# Patient Record
Sex: Male | Born: 1935 | Race: White | Hispanic: No | Smoking: Former smoker
Health system: Southern US, Community
[De-identification: ages and names within clinical notes are randomized; demographics above are authoritative.]

## PROBLEM LIST (undated history)

## (undated) DIAGNOSIS — R9439 Abnormal result of other cardiovascular function study: Secondary | ICD-10-CM

## (undated) DIAGNOSIS — R002 Palpitations: Secondary | ICD-10-CM

## (undated) DIAGNOSIS — I10 Essential (primary) hypertension: Secondary | ICD-10-CM

## (undated) DIAGNOSIS — M1990 Unspecified osteoarthritis, unspecified site: Secondary | ICD-10-CM

## (undated) DIAGNOSIS — S22080A Wedge compression fracture of T11-T12 vertebra, initial encounter for closed fracture: Secondary | ICD-10-CM

## (undated) DIAGNOSIS — E785 Hyperlipidemia, unspecified: Secondary | ICD-10-CM

## (undated) DIAGNOSIS — C801 Malignant (primary) neoplasm, unspecified: Secondary | ICD-10-CM

## (undated) DIAGNOSIS — N529 Male erectile dysfunction, unspecified: Secondary | ICD-10-CM

## (undated) DIAGNOSIS — I219 Acute myocardial infarction, unspecified: Secondary | ICD-10-CM

## (undated) DIAGNOSIS — I2510 Atherosclerotic heart disease of native coronary artery without angina pectoris: Secondary | ICD-10-CM

## (undated) DIAGNOSIS — I6529 Occlusion and stenosis of unspecified carotid artery: Secondary | ICD-10-CM

## (undated) DIAGNOSIS — K5730 Diverticulosis of large intestine without perforation or abscess without bleeding: Secondary | ICD-10-CM

## (undated) DIAGNOSIS — R42 Dizziness and giddiness: Secondary | ICD-10-CM

## (undated) DIAGNOSIS — E079 Disorder of thyroid, unspecified: Secondary | ICD-10-CM

## (undated) DIAGNOSIS — K449 Diaphragmatic hernia without obstruction or gangrene: Secondary | ICD-10-CM

## (undated) DIAGNOSIS — I714 Abdominal aortic aneurysm, without rupture, unspecified: Secondary | ICD-10-CM

## (undated) DIAGNOSIS — I872 Venous insufficiency (chronic) (peripheral): Secondary | ICD-10-CM

## (undated) DIAGNOSIS — K219 Gastro-esophageal reflux disease without esophagitis: Secondary | ICD-10-CM

## (undated) DIAGNOSIS — I209 Angina pectoris, unspecified: Secondary | ICD-10-CM

## (undated) DIAGNOSIS — R0602 Shortness of breath: Secondary | ICD-10-CM

## (undated) DIAGNOSIS — Z9289 Personal history of other medical treatment: Secondary | ICD-10-CM

## (undated) HISTORY — PX: HERNIA REPAIR: SHX51

## (undated) HISTORY — DX: Venous insufficiency (chronic) (peripheral): I87.2

## (undated) HISTORY — DX: Personal history of other medical treatment: Z92.89

## (undated) HISTORY — DX: Unspecified osteoarthritis, unspecified site: M19.90

## (undated) HISTORY — DX: Acute myocardial infarction, unspecified: I21.9

## (undated) HISTORY — DX: Malignant (primary) neoplasm, unspecified: C80.1

## (undated) HISTORY — DX: Occlusion and stenosis of unspecified carotid artery: I65.29

## (undated) HISTORY — DX: Wedge compression fracture of T11-T12 vertebra, initial encounter for closed fracture: S22.080A

## (undated) HISTORY — DX: Dizziness and giddiness: R42

## (undated) HISTORY — DX: Abnormal result of other cardiovascular function study: R94.39

## (undated) HISTORY — DX: Hyperlipidemia, unspecified: E78.5

## (undated) HISTORY — DX: Abdominal aortic aneurysm, without rupture: I71.4

## (undated) HISTORY — DX: Diaphragmatic hernia without obstruction or gangrene: K44.9

## (undated) HISTORY — DX: Essential (primary) hypertension: I10

## (undated) HISTORY — DX: Disorder of thyroid, unspecified: E07.9

## (undated) HISTORY — DX: Male erectile dysfunction, unspecified: N52.9

## (undated) HISTORY — DX: Gastro-esophageal reflux disease without esophagitis: K21.9

## (undated) HISTORY — DX: Diverticulosis of large intestine without perforation or abscess without bleeding: K57.30

## (undated) HISTORY — PX: OTHER SURGICAL HISTORY: SHX169

## (undated) HISTORY — DX: Palpitations: R00.2

## (undated) HISTORY — DX: Abdominal aortic aneurysm, without rupture, unspecified: I71.40

## (undated) HISTORY — PX: HAND SURGERY: SHX662

---

## 1999-12-08 ENCOUNTER — Encounter: Admission: RE | Admit: 1999-12-08 | Discharge: 1999-12-08 | Payer: Self-pay | Admitting: *Deleted

## 1999-12-08 ENCOUNTER — Encounter: Payer: Self-pay | Admitting: *Deleted

## 2001-01-08 ENCOUNTER — Encounter: Payer: Self-pay | Admitting: Internal Medicine

## 2001-01-08 ENCOUNTER — Encounter: Admission: RE | Admit: 2001-01-08 | Discharge: 2001-01-08 | Payer: Self-pay | Admitting: Internal Medicine

## 2001-04-04 ENCOUNTER — Ambulatory Visit (HOSPITAL_COMMUNITY): Admission: RE | Admit: 2001-04-04 | Discharge: 2001-04-04 | Payer: Self-pay | Admitting: Gastroenterology

## 2001-04-04 ENCOUNTER — Encounter (INDEPENDENT_AMBULATORY_CARE_PROVIDER_SITE_OTHER): Payer: Self-pay | Admitting: Specialist

## 2001-06-05 ENCOUNTER — Ambulatory Visit (HOSPITAL_COMMUNITY): Admission: RE | Admit: 2001-06-05 | Discharge: 2001-06-05 | Payer: Self-pay | Admitting: Internal Medicine

## 2001-06-05 ENCOUNTER — Encounter: Payer: Self-pay | Admitting: Internal Medicine

## 2001-09-01 ENCOUNTER — Ambulatory Visit (HOSPITAL_COMMUNITY): Admission: RE | Admit: 2001-09-01 | Discharge: 2001-09-01 | Payer: Self-pay | Admitting: Internal Medicine

## 2001-09-01 ENCOUNTER — Encounter: Payer: Self-pay | Admitting: Internal Medicine

## 2001-10-22 HISTORY — PX: ROTATOR CUFF REPAIR: SHX139

## 2001-12-31 ENCOUNTER — Encounter: Admission: RE | Admit: 2001-12-31 | Discharge: 2001-12-31 | Payer: Self-pay | Admitting: Internal Medicine

## 2001-12-31 ENCOUNTER — Encounter: Payer: Self-pay | Admitting: Internal Medicine

## 2002-01-14 ENCOUNTER — Encounter: Payer: Self-pay | Admitting: Orthopedic Surgery

## 2002-01-14 ENCOUNTER — Encounter: Admission: RE | Admit: 2002-01-14 | Discharge: 2002-01-14 | Payer: Self-pay | Admitting: Orthopedic Surgery

## 2002-01-15 ENCOUNTER — Ambulatory Visit (HOSPITAL_BASED_OUTPATIENT_CLINIC_OR_DEPARTMENT_OTHER): Admission: RE | Admit: 2002-01-15 | Discharge: 2002-01-16 | Payer: Self-pay | Admitting: Orthopedic Surgery

## 2002-12-24 ENCOUNTER — Encounter: Admission: RE | Admit: 2002-12-24 | Discharge: 2002-12-24 | Payer: Self-pay | Admitting: Internal Medicine

## 2002-12-24 ENCOUNTER — Encounter: Payer: Self-pay | Admitting: Internal Medicine

## 2004-01-03 ENCOUNTER — Encounter: Admission: RE | Admit: 2004-01-03 | Discharge: 2004-01-03 | Payer: Self-pay | Admitting: Internal Medicine

## 2004-05-12 ENCOUNTER — Ambulatory Visit (HOSPITAL_COMMUNITY): Admission: RE | Admit: 2004-05-12 | Discharge: 2004-05-12 | Payer: Self-pay | Admitting: Gastroenterology

## 2004-05-12 ENCOUNTER — Encounter (INDEPENDENT_AMBULATORY_CARE_PROVIDER_SITE_OTHER): Payer: Self-pay | Admitting: Specialist

## 2005-01-02 ENCOUNTER — Encounter: Admission: RE | Admit: 2005-01-02 | Discharge: 2005-01-02 | Payer: Self-pay | Admitting: Internal Medicine

## 2006-01-07 ENCOUNTER — Encounter: Admission: RE | Admit: 2006-01-07 | Discharge: 2006-01-07 | Payer: Self-pay | Admitting: Family Medicine

## 2006-06-09 ENCOUNTER — Emergency Department (HOSPITAL_COMMUNITY): Admission: EM | Admit: 2006-06-09 | Discharge: 2006-06-10 | Payer: Self-pay | Admitting: Emergency Medicine

## 2006-07-04 ENCOUNTER — Encounter: Admission: RE | Admit: 2006-07-04 | Discharge: 2006-07-04 | Payer: Self-pay | Admitting: Internal Medicine

## 2006-09-17 ENCOUNTER — Encounter: Admission: RE | Admit: 2006-09-17 | Discharge: 2006-10-11 | Payer: Self-pay | Admitting: Internal Medicine

## 2007-01-08 ENCOUNTER — Encounter: Admission: RE | Admit: 2007-01-08 | Discharge: 2007-01-08 | Payer: Self-pay | Admitting: Internal Medicine

## 2007-07-09 ENCOUNTER — Encounter: Admission: RE | Admit: 2007-07-09 | Discharge: 2007-07-09 | Payer: Self-pay | Admitting: Internal Medicine

## 2007-07-16 ENCOUNTER — Ambulatory Visit: Payer: Self-pay | Admitting: Vascular Surgery

## 2008-01-14 ENCOUNTER — Ambulatory Visit: Payer: Self-pay | Admitting: Vascular Surgery

## 2008-07-07 ENCOUNTER — Ambulatory Visit: Payer: Self-pay | Admitting: Vascular Surgery

## 2009-01-05 ENCOUNTER — Ambulatory Visit: Payer: Self-pay | Admitting: Vascular Surgery

## 2009-01-12 ENCOUNTER — Encounter: Admission: RE | Admit: 2009-01-12 | Discharge: 2009-01-12 | Payer: Self-pay | Admitting: Vascular Surgery

## 2009-02-02 ENCOUNTER — Ambulatory Visit: Payer: Self-pay | Admitting: Vascular Surgery

## 2009-02-24 ENCOUNTER — Ambulatory Visit: Payer: Self-pay | Admitting: Vascular Surgery

## 2009-02-24 ENCOUNTER — Inpatient Hospital Stay (HOSPITAL_COMMUNITY): Admission: RE | Admit: 2009-02-24 | Discharge: 2009-02-25 | Payer: Self-pay | Admitting: *Deleted

## 2009-02-24 HISTORY — PX: ABDOMINAL AORTIC ANEURYSM REPAIR: SUR1152

## 2009-02-25 ENCOUNTER — Encounter: Payer: Self-pay | Admitting: Vascular Surgery

## 2009-03-23 ENCOUNTER — Ambulatory Visit: Payer: Self-pay | Admitting: Vascular Surgery

## 2009-03-23 ENCOUNTER — Encounter: Admission: RE | Admit: 2009-03-23 | Discharge: 2009-03-23 | Payer: Self-pay | Admitting: Vascular Surgery

## 2009-04-15 ENCOUNTER — Encounter: Admission: RE | Admit: 2009-04-15 | Discharge: 2009-04-15 | Payer: Self-pay | Admitting: Internal Medicine

## 2009-04-15 ENCOUNTER — Emergency Department (HOSPITAL_COMMUNITY): Admission: EM | Admit: 2009-04-15 | Discharge: 2009-04-15 | Payer: Self-pay | Admitting: Emergency Medicine

## 2009-05-16 ENCOUNTER — Ambulatory Visit: Payer: Self-pay | Admitting: Surgery

## 2010-04-04 DIAGNOSIS — Z9289 Personal history of other medical treatment: Secondary | ICD-10-CM

## 2010-04-04 HISTORY — DX: Personal history of other medical treatment: Z92.89

## 2010-08-31 ENCOUNTER — Encounter: Admission: RE | Admit: 2010-08-31 | Discharge: 2010-08-31 | Payer: Self-pay | Admitting: Vascular Surgery

## 2010-08-31 ENCOUNTER — Ambulatory Visit: Payer: Self-pay | Admitting: Vascular Surgery

## 2010-09-06 ENCOUNTER — Encounter: Admission: RE | Admit: 2010-09-06 | Discharge: 2010-09-06 | Payer: Self-pay | Admitting: Sports Medicine

## 2011-01-29 LAB — COMPREHENSIVE METABOLIC PANEL
AST: 42 U/L — ABNORMAL HIGH (ref 0–37)
Albumin: 3.4 g/dL — ABNORMAL LOW (ref 3.5–5.2)
BUN: 10 mg/dL (ref 6–23)
Calcium: 9.1 mg/dL (ref 8.4–10.5)
Creatinine, Ser: 0.8 mg/dL (ref 0.4–1.5)
GFR calc Af Amer: >60 mL/min (ref >60)
Total Bilirubin: 0.5 mg/dL (ref 0.3–1.2)

## 2011-01-29 LAB — COMPREHENSIVE METABOLIC PANEL WITH GFR
ALT: 59 U/L — ABNORMAL HIGH (ref 0–53)
Alkaline Phosphatase: 95 U/L (ref 39–117)
CO2: 22 meq/L (ref 19–32)
Chloride: 99 meq/L (ref 96–112)
GFR calc non Af Amer: >60 mL/min (ref >60)
Glucose, Bld: 97 mg/dL (ref 70–99)
Potassium: 4 meq/L (ref 3.5–5.1)
Sodium: 133 meq/L — ABNORMAL LOW (ref 135–145)
Total Protein: 7.6 g/dL (ref 6.0–8.3)

## 2011-01-29 LAB — URINALYSIS, ROUTINE W REFLEX MICROSCOPIC
Bilirubin Urine: NEGATIVE
Glucose, UA: NEGATIVE mg/dL
Hgb urine dipstick: NEGATIVE
Ketones, ur: NEGATIVE mg/dL
Nitrite: NEGATIVE
Protein, ur: NEGATIVE mg/dL
Specific Gravity, Urine: 1.005 (ref 1.005–1.030)
Urobilinogen, UA: 0.2 mg/dL (ref 0.0–1.0)
pH: 6.5 (ref 5.0–8.0)

## 2011-01-29 LAB — CBC
HCT: 40.7 % (ref 39.0–52.0)
Hemoglobin: 13.5 g/dL (ref 13.0–17.0)
MCHC: 33.3 g/dL (ref 30.0–36.0)
MCV: 92.7 fL (ref 78.0–100.0)
Platelets: 309 10*3/uL (ref 150–400)
RBC: 4.39 MIL/uL (ref 4.22–5.81)
RDW: 12.9 % (ref 11.5–15.5)
WBC: 9.3 K/uL (ref 4.0–10.5)

## 2011-01-29 LAB — DIFFERENTIAL
Basophils Absolute: 0 10*3/uL (ref 0.0–0.1)
Basophils Relative: 0 % (ref 0–1)
Eosinophils Absolute: 0 K/uL (ref 0.0–0.7)
Eosinophils Relative: 1 % (ref 0–5)
Lymphocytes Relative: 22 % (ref 12–46)
Lymphs Abs: 2 10*3/uL (ref 0.7–4.0)
Monocytes Absolute: 0.8 10*3/uL (ref 0.1–1.0)
Monocytes Relative: 8 % (ref 3–12)
Neutro Abs: 6.5 10*3/uL (ref 1.7–7.7)
Neutrophils Relative %: 70 % (ref 43–77)

## 2011-01-29 LAB — LIPASE, BLOOD: Lipase: 18 U/L (ref 11–59)

## 2011-01-30 LAB — URINALYSIS, ROUTINE W REFLEX MICROSCOPIC
Hgb urine dipstick: NEGATIVE
Specific Gravity, Urine: 1.015 (ref 1.005–1.030)
Urobilinogen, UA: 0.2 mg/dL (ref 0.0–1.0)
pH: 7.5 (ref 5.0–8.0)

## 2011-01-30 LAB — COMPREHENSIVE METABOLIC PANEL
ALT: 27 U/L (ref 0–53)
AST: 31 U/L (ref 0–37)
Albumin: 3.9 g/dL (ref 3.5–5.2)
Alkaline Phosphatase: 64 U/L (ref 39–117)
CO2: 26 mEq/L (ref 19–32)
Chloride: 105 mEq/L (ref 96–112)
Creatinine, Ser: 0.75 mg/dL (ref 0.4–1.5)
GFR calc Af Amer: >60 mL/min (ref >60)
Potassium: 4 mEq/L (ref 3.5–5.1)
Sodium: 138 mEq/L (ref 135–145)
Total Bilirubin: 0.8 mg/dL (ref 0.3–1.2)

## 2011-01-30 LAB — BLOOD GAS, ARTERIAL
Drawn by: 313941
FIO2: 0.21 %
Patient temperature: 98.6
pCO2 arterial: 37.2 mmHg (ref 35.0–45.0)
pH, Arterial: 7.447 (ref 7.350–7.450)

## 2011-01-30 LAB — CBC
MCHC: 34.8 g/dL (ref 30.0–36.0)
Platelets: 142 10*3/uL — ABNORMAL LOW (ref 150–400)
RBC: 4.01 MIL/uL — ABNORMAL LOW (ref 4.22–5.81)
RBC: 4.67 MIL/uL (ref 4.22–5.81)
RDW: 13.4 % (ref 11.5–15.5)
WBC: 6.9 10*3/uL (ref 4.0–10.5)

## 2011-01-30 LAB — BASIC METABOLIC PANEL
CO2: 26 mEq/L (ref 19–32)
Calcium: 8.4 mg/dL (ref 8.4–10.5)
Creatinine, Ser: 0.72 mg/dL (ref 0.4–1.5)
GFR calc Af Amer: >60 mL/min (ref >60)
GFR calc non Af Amer: >60 mL/min (ref >60)
Glucose, Bld: 144 mg/dL — ABNORMAL HIGH (ref 70–99)

## 2011-01-30 LAB — TYPE AND SCREEN
ABO/RH(D): B POS
Antibody Screen: NEGATIVE

## 2011-01-30 LAB — PROTIME-INR: INR: 1.1 (ref 0.00–1.49)

## 2011-03-06 NOTE — Discharge Summary (Signed)
NAME:  Henry Neal, Henry Neal NO.:  [id number]   MEDICAL RECORD NO.:  [id number]          PATIENT TYPE:  INP   LOCATION:  3305                         FACILITY:  [facility]   PHYSICIAN:  Janetta Hora. Fields, MD  DATE OF BIRTH:  01/16/1927   DATE OF ADMISSION:  02/24/2009  DATE OF DISCHARGE:  02/25/2009                               DISCHARGE SUMMARY   The patient of Dr. Darrick Penna.   FINAL DISCHARGE DIAGNOSES:  1. Abdominal aortic aneurysm.  2. Erectile dysfunction.  3. T12 compression fracture.  4. Allergic rhinitis.  5. Positional vertigo.  6. Sigmoid diverticulosis.  7. Colonic polyps.  8. Chronic venous insufficiency.  9. Gastroesophageal reflux disease.  10.Hypothyroidism.  11.Hypertension.   PROCEDURE PERFORMED:  Endovascular repair of abdominal aortic aneurysm  using gore excluder, abdominal aortic aneurysm stent graft 23 x 12 x 14  main body, limbs crossed 12 x 10 on left leg by Dr. Darrick Penna on Feb 24, 2009.   COMPLICATIONS:  None.   CONDITION AT DISCHARGE:  Stable and improving.   DISCHARGE MEDICATIONS:  He is instructed to resume all previous  medications consisting of,  1. Toprol 50 mg p.o. daily.  2. Triamterene and hydrochlorothiazide 37.5/25 mg p.o. daily.  3. Prilosec 20 mg p.o. daily.  4. Centrum Silver p.o. daily.  5. Aspirin 81 mg p.o. daily.  6. Calcium plus D 600 mg p.o. b.i.d.  7. He is given a prescription for Percocet 5/325 one p.o. q.4 h.      p.r.n. pain, total #30 were given.   DISPOSITION:  He is being discharged home in stable condition with his  wounds healing well.  He is given careful instructions regarding the  care of his wounds and activity level.  He is given a return appointment  with Dr. Darrick Penna in 4 weeks with CTA of the abdomen and pelvis with stent  graft protocol and ABIs.   BRIEF IDENTIFYING STATEMENT:  For complete details, please refer to the  typed history and physical.  Briefly, this 77 year old gentleman with an  abdominal aortic aneurysm was referred to Dr. Darrick Penna for evaluation.  Dr. Darrick Penna evaluated him and found him to be a suitable candidate for  endovascular repair.  He was informed of the risks and benefits of the  procedure and after careful consideration elected to proceed with  surgery.   HOSPITAL COURSE:  Preoperative workup was completed as an outpatient.  He was brought in through same-day surgery and underwent the  aforementioned repair of his abdominal aortic aneurysm.  For complete  details, please refer to the typed operative report.  The procedure was  without complications and was returned to the postanesthesia care unit  extubated.  Following stabilization, he was transferred to a bed on a  surgical step-down unit.  He was observed overnight.  Following day, he  was walking and improving with physical therapy.  He was voiding.  He  was felt to be stable and was discharged home.      Wilmon Arms, PA      Janetta Hora. Darrick Penna, MD  Electronically Signed    KEL/MEDQ  D:  02/25/2009  T:  02/25/2009  Job:  [id number]

## 2011-03-06 NOTE — Op Note (Signed)
NAME:  Henry Neal, Henry Neal NO.:  [id number]   MEDICAL RECORD NO.:  [id number]          PATIENT TYPE:  INP   LOCATION:  3305                         FACILITY:  [facility]   PHYSICIAN:  Larina Earthly, M.D.    DATE OF BIRTH:  [birth year]   DATE OF PROCEDURE:  02/24/2009  DATE OF DISCHARGE:                               OPERATIVE REPORT   PREOPERATIVE DIAGNOSIS:  Infrarenal abdominal aortic aneurysm.   PROCEDURE:  Exposure of left femoral artery with femoral artery cutdown  and closure.   SURGEON:  Larina Earthly, MD   ANESTHESIA:  General endotracheal.   This was in conjunction with aortic stent graft, repair of abdominal  aortic aneurysm, which will be dictated in as a separate note by Dr.  Fabienne Bruns.   PROCEDURE IN DETAIL:  The patient was taken to the operating room,  placed in supine position, where the area of the bilateral groins and  abdomen prepped and draped in the usual sterile fashion.  Incision was  made over the femoral pulse on the left and carried down through the  subcutaneous tissue with electrocautery.  Tributary branch of the common  femoral artery was ligated and divided for exposure.  The femoral artery  did have a posterior plaque.  The artery was exposed just at the area of  the inguinal ligament down to the level of the superficial femoral and  profunda femoris artery bifurcation.  This was doubly looped with vessel  loop proximally and distally for control.  The remaining of the  procedure will be dictated by Dr. Darrick Penna.  At the completion of the  procedure, the 12-French sheath was removed from the left common femoral  artery and control was obtained with a Henley clamp proximally and an  angled DeBakey clamp distally.  The resulting hole in the artery was  controlled and closed with running 5-0 Prolene suture.  The subcutaneous  tissue was closed with 2-0 Vicryl suture in several layers.  The skin  was closed with 3-0 subcuticular Vicryl  stitch.  Sterile dressing was  applied.  The patient was taken to the recovery room in stable  condition.      Larina Earthly, M.D.  Electronically Signed     TFE/MEDQ  D:  02/24/2009  T:  02/25/2009  Job:  [id number]

## 2011-03-06 NOTE — Assessment & Plan Note (Signed)
OFFICE VISIT   Henry Neal, Henry Neal  DOB:  [birth year]                                       03/23/2009  ZOXWR#:[id number]   The patient returns for follow-up today.  He underwent placement of an  aneurysm stent graft on Feb 24, 2009.  He had no problems during his  hospital course or postoperative follow-up.  He returns today for  further follow-up today.  He apparently had a urinary tract infection  recently but thinks he may be getting better from this, although he  still has some vague pelvic abdominal pain.   He denies any claudication type symptoms.  He denies any abdominal pain.  He does still have some occasional back pain which he says is related to  the hospital bed.  Overall, he is doing well.  His appetite is normal  and he has essentially returned to his normal activities at this point.   PHYSICAL EXAMINATION:  Today, blood pressure is 165/73 in the left arm,  pulse is 67 and regular.  Temperature is 98.4.  Left and right groin  incisions are well-healed.  He has 2+ femoral and dorsalis pedis pulses  bilaterally.  Abdomen:  Soft, nontender, nondistended.  No masses.   CT scan of the abdomen and pelvis was obtained today.  This shows that  the aneurysm is well excluded with top portion of the stent right at the  level of renal arteries.  There is no type 1 endoleak proximally or  distally.  There is a small type 2 endoleak which appears to be coming  from the L3 lumbar artery.  Aneurysm diameter was 52.7-mm x 50.2-mm.  This is essentially unchanged from preoperatively.   Overall, the patient appears to be doing well.  His aneurysm is well-  excluded by the stent graft.  He will follow up in 6 months' time for  repeat CT angio at that time.  We will continue to follow his type 2  endoleak and as long as there is no expansion of the aneurysm these are  usually benign in nature.  I discussed this with the patient today and  also reviewed the CT scan  films with the patient.   Janetta Hora. Fields, MD  Electronically Signed   CEF/MEDQ  D:  03/23/2009  T:  03/24/2009  Job:  [id number]   cc:   Thora Lance, M.D.

## 2011-03-06 NOTE — Procedures (Signed)
DUPLEX ULTRASOUND OF ABDOMINAL AORTA   INDICATION:  Follow-up evaluation of known abdominal aortic aneurysm.   HISTORY:  Diabetes:  No.  Cardiac:  No.  Hypertension:  Yes.  Smoking:  No.  Connective Tissue Disorder:  Family History:  Cousin had an abdominal aortic aneurysm.  Previous Surgery:   DUPLEX EXAM:         AP (cm)                   TRANSVERSE (cm)  Proximal             2.1 cm                    2.0 cm  Mid                  4.6 cm                    5.4 cm  Distal               4.5 cm                    4.5 cm  Right Iliac          1.3 cm                    1.6 cm  Left Iliac           3.3 cm                    3.1 cm   PREVIOUS:  Date:  07/07/2008  AP:  4.8  TRANSVERSE:  4.9   IMPRESSION:  1. Abdominal aortic aneurysm is slightly larger than previously      recorded.  2. 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   ___________________________________________  Janetta Hora. Fields, MD   MC/MEDQ  D:  01/05/2009  T:  01/05/2009  Job:  [id number]

## 2011-03-06 NOTE — Op Note (Signed)
NAME:  Henry Neal, Henry Neal NO.:  [id number]   MEDICAL RECORD NO.:  [id number]          PATIENT TYPE:  INP   LOCATION:  2899                         FACILITY:  [facility]   PHYSICIAN:  Janetta Hora. Fields, MD  DATE OF BIRTH:  [birth year]   DATE OF PROCEDURE:  02/24/2009  DATE OF DISCHARGE:                               OPERATIVE REPORT   PROCEDURE:  Placement of Gore excluder abdominal aortic aneurysm stent  graft.   PREOPERATIVE DIAGNOSIS:  Abdominal aortic aneurysm.   POSTOPERATIVE DIAGNOSIS:  Abdominal aortic aneurysm.   ANESTHESIA:  General.   SURGEON:  Charles E. Fields, MD   ASSISTANT:  Larina Earthly, MD   OPERATIVE FINDINGS:  1. A 23 x 12 x 14 main body placed through the right leg.  2. A 12 x 10 left leg extension, left leg limb.   OPERATIVE DETAILS:  After obtaining informed consent, the patient was  taken to the operating room.  The patient was placed in supine position  on the operating room table.  After induction of general anesthesia and  endotracheal intubation, a Foley catheter was placed.  Next, the patient  was prepped from the xiphoid down to the knees bilaterally.  Bilateral  oblique incisions were made in each groin to expose the common femoral  artery.  The left groin exposure and repair is dictated separately as an  operative note by Dr. Arbie Cookey.   Incision on the right side was carried down through the subcutaneous  tissues down to the level of the right common femoral artery.  This was  dissected free circumferentially.  There was some posterior plaque but  the anterior surface of the artery was soft in character.  After the  artery had been exposed, an introducer needle was used to cannulate the  left common femoral artery and a 0.035 Benson wire was introduced into  the left iliac system and up into the abdominal aorta under fluoroscopic  guidance.  A long 8-French sheath was then placed over the guidewire in  the left iliac system.  An  introducer needle was then used to cannulate  the right common femoral artery.  A 0.035 J-tip guidewire was threaded  through the right femoral artery and up into the abdominal aorta under  fluoroscopic guidance.  A short 8-French sheath was then placed over the  guidewire into the right common femoral artery.  Both the sheaths were  thoroughly flushed with heparinized saline.  A 5-French Omni flush  catheter was then brought up in the operative field and placed over the  guidewire through the right groin up to the level of the abdominal  aorta.  Abdominal aortogram was then obtained to determine location of  the renal arteries as well as determine the length of the main body  device.  Measurements were made off the calibrated Omni flush catheter  and a 23 x 12 x 14 main body device was selected.  This device was  selected so that the extension on the right leg would be just short of  the takeoff of the internal iliac  artery.  At this point, the 5-French  Omni flush catheter was pulled back over the guidewire and exchanged  over to the left side over the Bentson wire.  This was kept in place  just above the level of the renal arteries.  The 8-French sheath on the  right side was then exchanged for an 18-French sheath.  This was  slightly snug going through the right common iliac artery and it  navigated and crossed the shelf on the contralateral side of the  aneurysm just below the neck.  However, this did pass with minimal  difficulty.  The main body device was then introduced through the 18-  [place] sheath.  However, this also stuck on the angle of the aneurysm  sac.  Therefore, the device was pulled back out and the dilator placed  back into the 18-French sheath and the sheath advanced up higher past  the shelf just below the level of the renal arteries.  The device was  then reintroduced into the 18-French sheath and the sheath pulled back,  so that the device was placed in the  approximate location of the renal  arteries.  An additional contrast angiogram was obtained to determine  precise level of the renal arteries.  The main body device was then  deployed in a continuous fashion down to the gate.  The top end of the  graft seated well in the aneurysm.  This was deployed at an 15  craniocaudal angulation to make sure that there was no parallax.  Next,  the 5-French Omni sheath catheter was pulled down to the level of the  gate.  This was then selectively catheterized and the 0.035 Bentson wire  threaded up into the main body of the aneurysm device.  The 5-French  Omni flush catheter was then placed over the guidewire into the middle  of the device.  This was then twirled to confirm that we were within the  lumen of the graft.  At this point, a retrograde contrast angiogram was  obtained through the 8-French sheath on the left side.  This was to  determine precise lengths for the left iliac system.  A 12 x 10 cm left  leg was selected to give [place] adequate coverage into the left iliac system  without covering the left internal iliac artery.  An Amplatz wire was  then placed through the Omni flush catheter and the Omni flush catheter  was removed.  The 8-French sheath was then also removed.  This was  exchanged for a 12-French sheath.  The 12 x 10 left limb was then  introduced through the 12-French sheath with significant overlap up to  the long marker in the contralateral limb.  This was then deployed in  the usual fashion down into the left iliac system.  There were at least  3 cm of graft into the left iliac system with at least 1 cm of uncovered  artery just above the internal iliac artery.  At this point, the  delivery device was removed.  The remainder of the right limb was then  fully deployed after pulling the sheath back.  A Gore molding balloon  was then brought up in the operative field and introduced through the  left iliac system.  The top portion of  the stent was molded to shape  after a full inflation of the balloon.  This was then brought down to  the level of the attachment point of the left limb and  the distal end of  the left limb and these were also tacked up with the balloon.  The  balloon was then moved over to the right side and the entire right limb  was molded with a balloon as well.  The 5-French Omni flush catheter was  then threaded back over the guidewire up the left side.  A completion  arteriogram was then obtained.  This showed patent left and right renal  arteries with good apposition of the stent graft proximally and  distally.  There was no evidence of type 1 endoleak.  There was  suggestion of a delayed type 2 endoleak with some delayed contrast  filling the sac.  At this point, all guidewires and sheaths were  removed.  The artery was controlled proximally with a Henley clamp and  distally with a peripheral DeBakey clamp.  The arteriotomy was repaired  using a running 5-0 Prolene suture.  Everything was thoroughly flushed  and back-bled prior to completing the anastomosis.  Clamps were removed.  There was palpable pulse in right common femoral artery immediately.  The groin wound on the right side was closed with running 2-0, 3-0  Vicryl suture and a 4-0 Vicryl subcuticular stitch in the skin.  Left  groin and artery closure as stated previously is dictated separately by  Dr. Arbie Cookey in a separate operative note.  The patient's feet were checked  at the end of the case.  They were pink and warm.  The patient was  extubated in the operating room and taken to recovery room in stable  condition.      Janetta Hora. Fields, MD  Electronically Signed     CEF/MEDQ  D:  02/24/2009  T:  02/25/2009  Job:  [id number]

## 2011-03-06 NOTE — Procedures (Signed)
DUPLEX ULTRASOUND OF ABDOMINAL AORTA   INDICATION:  Followup of known abdominal aortic aneurysm.   HISTORY:  Diabetes:  No.  Cardiac:  No.  Hypertension:  Yes.  Smoking:  No.  Connective Tissue Disorder:  Family History:  Cousin died from AAA.  Previous Surgery:  No.   DUPLEX EXAM:         AP (cm)                   TRANSVERSE (cm)  Proximal             1.96 cm                   2.12 cm  Mid                  4.58 cm                   4.79 cm  Distal               2.33 cm                   2.33 cm  Right Iliac          0.99 cm                   0.90 cm  Left Iliac           0.86 cm                   0.96 cm   PREVIOUS:  Date: September 2008  AP:  TRANSVERSE:  4.9   IMPRESSION:  1. Known AAA (abdominal aortic aneurysm) with maximum diameter      measuring 4.58 cm (AP) x 4.79.  2. Significant mural thrombus noted in M/D AAA.  3. Bilateral CIAs WNL.   ___________________________________________  Janetta Hora Fields, MD   PB/MEDQ  D:  01/14/2008  T:  01/14/2008  Job:  [id number]

## 2011-03-06 NOTE — Procedures (Signed)
DUPLEX ULTRASOUND OF ABDOMINAL AORTA   INDICATION:  Follow up abdominal aortic aneurysm.   HISTORY:  Diabetes:  No.  Cardiac:  No.  Hypertension:  Yes.  Smoking:  No.  Connective Tissue Disorder:  Family History:  Cousin died of abdominal aortic aneurysm.  Previous Surgery:  No.   DUPLEX EXAM:         AP (cm)                   TRANSVERSE (cm)  Proximal             2.9 Cm                    2.8 cm  Mid                  2.1 cm                    2.3 cm  Distal               4.8 cm                    4.9 cm  Right Iliac          Not visualized            Not visualized  Left Iliac           Not visualized            Not visualized   PREVIOUS:  Date: 01/13/08  AP:  4.58  TRANSVERSE:  4.79   IMPRESSION:  1. Aneurysm of the distal abdominal aorta with significant mural      thrombus noted.  2. Unable to adequately visualize the bilateral common iliac arteries      due to overlying bowel gas patterns.  3. No significant change noted in the maximum diameter measurement      when compared to the previous examination on 01/13/08.   ___________________________________________  Janetta Hora. Fields, MD   CH/MEDQ  D:  07/07/2008  T:  07/07/2008  Job:  [id number]

## 2011-03-06 NOTE — Assessment & Plan Note (Signed)
OFFICE VISIT   Henry Neal, Henry Neal  DOB:  08-17-1927                                       05/16/2009  VWUJW#:[id number]   REASON FOR VISIT:  Evaluate left groin.   HISTORY:  This is a 77 year old gentleman who has undergone endovascular  repair of his aneurysm.  He is a walk-in today for evaluation of his  left groin.  He states that he has noticed some drainage on his  underwear at home and he was concerned about this.   On examination, his incision is well-healed.  There is no evidence of  infection.  There is minimal skin separation.  I do not think this is  going to cause him a problem.  I have recommended that he place a piece  of gauze over his incision to prevent it from staining his underwear and  he can follow up with Dr. Darrick Penna at his regularly-scheduled appointment.   Jorge Ny, MD  Electronically Signed   VWB/MEDQ  D:  05/16/2009  T:  05/18/2009  Job:  [id number]

## 2011-03-06 NOTE — Assessment & Plan Note (Signed)
OFFICE VISIT   Henry Neal, Henry Neal  DOB:  1926/12/20                                       07/16/2007  ZOXWR#:[id number]   The patient is a 77 year old male referred by Dr. Valentina Lucks for  asymptomatic abdominal aortic aneurysm.  He denies any history of  abdominal or back pain.  He has no family history of aneurysm.  He has  had a known abdominal aortic aneurysm, which has been followed over time  by serial ultrasound.  He recently had an ultrasound in March 2008,  which showed that the aneurysm was 4.5 cm in diameter.  His most recent  ultrasound in September 2008 showed the aneurysm now to be 4.9 cm in  diameter.  His atherosclerotic risk factors include hypertension.  He  denies history of coronary artery disease, elevated cholesterol, or  diabetes.  He is a former smoker, but quit several years ago.   PAST SURGICAL HISTORY:  Remarkable for a rotator cuff repair in his left  arm in 2003 and bilateral inguinal hernia repairs in 1986.   PAST MEDICAL HISTORY:  Also remarkable for erectile dysfunction, T12  compression fracture, allergic rhinitis, positional vertigo, sigmoid  diverticulosis, colon polyps, chronic venous insufficiency, GRRD, and  hyperthyroidism.   MEDICATIONS:  1. Triamterine/hydrochlorothiazide 37.5/25 daily.  2. Toprol 50 mg once a day.  3. Prilosec 20 mg once a day.  4. Centrum Silver once a day.  5. Calcium plus vitamin D 600 mg twice a day.  6. Aspirin 81 mg once a day.   He is allergic to Novocaine, which causes tingling, but states that  Xylocaine is okay.   SOCIAL HISTORY:  He is a retired Chartered certified accountant.  He is widowed.  He drinks 1  to 2 beers per day.  Smoking his is as above.   REVIEW OF SYSTEMS:  He is 5 feet 11 inches, 195 pounds.  He denies  recent history of chest pain, asthma, wheezing, renal dysfunction,  stroke, TIA, or amaurosis.  He does have a history of dizziness,  arthritis, and a hiatal hernia.   PHYSICAL EXAM:   Blood pressure is 152/74, heart rate is 59 and regular.  HEENT is unremarkable.  He has 2+ carotid pulses without bruit.  Chest  is clear to auscultation.  Cardiac exam is regular rate and rhythm  without murmur.  Abdomen is soft, nontender, and nondistended.  He has a  vaguely palpable pulsatile mass in the epigastrium.  He has 2+ carotid  pulses without bruit, 2+ radial, femoral, 1+ dorsalis pedis, and 2+  posterior tibial pulses bilaterally.  He has 1+ popliteal pulses  bilaterally.   On reviewing the patient's ultrasounds, his aneurysm has grown slowly  over the last few years.  It is still less than 5.5 cm in diameter,  which means his risk of rupture should be less than 1% per year.  However, I do agree he should be followed closely.  He should be  scheduled for an ultrasound in 6 months' time and an office visit at  that time as well.  I also informed him that, if he has any abdominal or  back pain, he should inform anyone in the emergency room he has a  history of an aneurysm.  If his aneurysm does reach the threshold of 5.5  cm and continues to grow at this  rate, we will consider repair in the  near future.  I had a lengthy discussion with the patient and his  daughter regarding his treatment plan today.   Janetta Hora. Fields, MD  Electronically Signed   CEF/MEDQ  D:  08/13/2007  T:  08/14/2007  Job:  [id number]   cc:   Thora Lance, M.D.

## 2011-03-06 NOTE — Assessment & Plan Note (Signed)
OFFICE VISIT   LAURI, TILL  DOB:  12-Jan-1927                                       01/14/2008  [id number]#:[id number]   Patient returns for followup today.  He was last seen in 09/08.  At that  time, he had a 4.9 cm abdominal aortic aneurysm.  His primary risk  factor is hypertension.  He denies any significant abdominal or back  pain in the last six months.  He has had no hospital admissions and has  really been well overall.   He has had no changes in his medications since his last visit.   PHYSICAL EXAMINATION:  Blood pressure is 164/77, heart rate 59 and  regular.  Cardiac:  Regular rate and rhythm.  Chest:  Clear to  auscultation.  He has 2+ femoral, posterior  tibial pulses bilaterally.  He has 1+ dorsalis pedis pulses bilaterally.  Abdomen is soft and  nontender with a vaguely palpable pulsatile epigastric mass.  This is  nontender.   He had a repeat ultrasound of his aneurysm today which showed the  aneurysm diameter is 4.8 cm, compared to 4.9 cm in 09/08.   I believe the best option for the patient is continued observation with  serial ultrasounds.  He will see me again in six months time for repeat  ultrasound.  He will inform the emergency room if he has any episodes of  abdominal or back pain.  I did reassure him that the risk for rupture of  aneurysm of less than 5.5 cm is less than 1% per year.   Janetta Hora. Fields, MD  Electronically Signed   CEF/MEDQ  D:  01/14/2008  T:  01/15/2008  Job:  [id number]   cc:   Thora Lance, M.D.

## 2011-03-06 NOTE — Assessment & Plan Note (Signed)
OFFICE VISIT   Henry, Neal  DOB:  [birth year]                                       08/31/2010  [id number]#:[id number]   The patient returns for followup today.  He previously underwent repair  of abdominal aortic aneurysm with a Gore excluder stent graft in May of  2010.  Since that time he denies any abdominal or back pain.  He states  he has been undergoing a workup recently for some palpitations.  Overall  though he feels well.  He states he has gained a little bit of weight.   CHRONIC MEDICAL PROBLEMS:  Continue to remain hypertension.  This is  well-controlled and followed by his primary care doctor.  He states  usually his blood pressure runs in the 120s to 130s at home although it  was more elevated in our office visit today.   REVIEW OF SYSTEMS:  He denies any shortness of breath, chest pain or  chronic cough.   PHYSICAL EXAM:  Vital signs:  Blood pressure is 175/70 in the left arm,  oxygen saturation 99% on room air, heart rate 64 and regular.  Abdomen:  Soft, nontender, nondistended.  No masses.  Both groin incisions are  well-healed.  Extremities:  Pink, warm and well-perfused.   He had a CT angiogram of the abdomen and pelvis today which shows that  his aneurysm diameter has contracted from 5.4 cm to 2.7 cm.  There is no  evidence of migration.  Stent graft is in good position.   Of note, he previously had some chest nodules on his CT scan.  These  were not seen on the CT scan today as the cuts were slightly lower.  Therefore, we have scheduled him for a repeat CT scan of the chest to  make sure that the nodules in his chest are stable.  If these look  unchanged then we will schedule him for a followup ultrasound of the  aorta in one year's time.     Janetta Hora. Fields, MD  Electronically Signed   CEF/MEDQ  D:  08/31/2010  T:  09/01/2010  Job:  [id number]   cc:   Thora Lance, M.D.

## 2011-03-06 NOTE — Assessment & Plan Note (Signed)
OFFICE VISIT   ZYMERE, PATLAN  DOB:  01-Nov-1926                                       02/02/2009  EAVWU#:[id number]   The patient is a 77 year old male who we have been following for a small  abdominal aortic aneurysm.  He was last seen in March of 2009.  He  presents today for further followup.  He denies any abdominal or back  pain.  His atherosclerotic risk factors include hypertension.  He is a  former smoker but quit several years ago.  He denies history of coronary  artery disease, elevated cholesterol or diabetes.   PAST SURGICAL HISTORY:  Rotator cuff repair left arm 2003, bilateral  inguinal hernia repair 1986.   PAST MEDICAL HISTORY:  Erectile dysfunction, T12 compression fracture,  allergic rhinitis, positional vertigo, sigmoid diverticulosis, colon  polyps, chronic venous insufficiency, GERD, hypothyroidism.   MEDICATIONS:  1. Triamterene/hydrochlorothiazide 37.5/25 once a day.  2. Toprol 50 mg once a day.  3. Prilosec 20 mg once a day.  4. Aspirin 81 mg once a day.  5. Centrum once a day.  6. Calcium 400 mg 2 times a day.   ALLERGIES:  No known drug allergies.   FAMILY HISTORY:  Unremarkable.   SOCIAL HISTORY:  He is widowed and has two children.  He is a former  smoker and quit in 1979.  He drinks approximately 2 beers daily.   REVIEW OF SYSTEMS:  He is 5 feet 10 inches, 195 pounds.  Cardiac,  pulmonary, GI, vascular, psychiatric, ENT and hematologic review of  systems are all negative.  GI:  He has a hiatal hernia and occasional reflux.  NEUROLOGIC:  He has some occasional dizziness.  ORTHOPEDIC:  He has multiple joint arthritis.   PHYSICAL EXAM:  Blood pressure is 156/67 in the left arm, pulse is 63  and regular.  HEENT:  Unremarkable.  Neck:  Has 2+ carotid pulses  without bruit.  Chest:  Clear to auscultation.  Cardiac:  Regular rate  and rhythm without murmur.  Abdomen:  Is soft, nontender, nondistended  with a vaguely  palpable pulsatile mass.  Extremities:  He has 2+ radial,  femoral, popliteal and posterior tibial pulses bilaterally.  He has 1+  dorsalis pedis pulses bilaterally.   He had a CT angiogram of the abdomen and pelvis today which shows that  the aneurysm is now 5.3 cm in diameter.  It is infrarenal in character.  The iliac arteries are ectatic but no significant iliac aneurysm.  The  anatomic configuration seems amenable to endovascular stent graft  repair.   I believe the best option for the patient would be endovascular stent  graft repair of his aneurysm at this time.  I discussed the risks,  benefits, possible complications and procedure details today including  but not limited to bleeding, infection, myocardial complications.  He  understands and agrees to proceed.  We have scheduled him for a cardiac  stress test with [facility] and I will speak with the [facility]  about anatomic configuration for an excluder stent graft.  Hopefully we  will be able to schedule this within the next few weeks.   Janetta Hora. Fields, MD  Electronically Signed   CEF/MEDQ  D:  02/03/2009  T:  02/03/2009  Job:  [id number]   cc:   Thora Lance,  M.D. 

## 2011-03-09 NOTE — Op Note (Signed)
[place]  Patient:    Henry Neal, Henry Neal Visit Number: [id number] MRN: [id number]          Service Type: DSU Location: [facility] Attending Physician:  Cornell Barman Dictated by:   Lenard Galloway Chaney Malling, M.D. Proc. Date: 01/15/02 Admit Date:  01/15/2002                             Operative Report  PREOPERATIVE DIAGNOSIS:  Large rotator cuff tear, left shoulder; with retraction.  POSTOPERATIVE DIAGNOSIS:  Large rotator cuff tear, left shoulder, with retraction.  OPERATION:  Neer anterior 1/3 acromioplasty and open repair of rotator cuff, using Mitek staples and sutures.  SURGEON:  Lenard Galloway. Chaney Malling, M.D.  ANESTHESIA:  General.  DESCRIPTION OF PROCEDURE:  After satisfactory general anesthesia, the patient was placed on the operating table in semi-seated position.  The left shoulder and upper extremity was prepped with Duraprep and draped out in the usual manner.  An incision was made over the anterolateral aspect of the shoulder. Bleeders were coagulated.  The deltoid fibers were released off the anterior aspect of the acromial only.  The deltoid was then split somewhat.  The subacromial bursa was excised.  A very generous Neer anterior 1/3 acromioplasty was done.  Once this was completed there was excellent access to the shoulder joint.  There was a very large rotator cuff tear, with retraction.  The posterior portion had retracted superiorly and posteriorly. This could be grabbed with Kocher and pulled down in almost an anatomic position.  Mitek staples were placed in the nonarticular portion of the humeral head.  Large sutures were passed through the tear, then the torn leaf was brought distally and anteriorly into an anatomic position.  This was sutured down with subreinforcement sutures used and heavy Tycron sutures. Excellent watertight repair was eventually achieved.  Throughout the procedure the shoulder was irrigated with  copious amounts of antibiotic solution.  The deltoid fibers were then reattached with 0 Vicryl, and 2-0 Vicryl was used to close the subcutaneous tissue.  Stainless steel staples were used to close the skin and sterile dressings were applied.  The patient was then returned to recovery room in excellent condition.  She tolerated the procedure well and there were no complications.  No drains were used. Dictated by:   Lenard Galloway Chaney Malling, M.D. Attending Physician:  Cornell Barman DD:  01/15/02 TD:  01/16/02 Job: [id number]

## 2011-05-30 ENCOUNTER — Other Ambulatory Visit: Payer: Self-pay | Admitting: Vascular Surgery

## 2011-05-30 DIAGNOSIS — R222 Localized swelling, mass and lump, trunk: Secondary | ICD-10-CM

## 2011-07-10 ENCOUNTER — Encounter: Payer: Self-pay | Admitting: Vascular Surgery

## 2011-08-29 ENCOUNTER — Encounter: Payer: Self-pay | Admitting: Vascular Surgery

## 2011-08-30 ENCOUNTER — Ambulatory Visit
Admission: RE | Admit: 2011-08-30 | Discharge: 2011-08-30 | Disposition: A | Discharge status: Home / Self Care | Payer: Medicare Other | Source: Ambulatory Visit | Attending: Vascular Surgery | Admitting: Vascular Surgery

## 2011-08-30 ENCOUNTER — Ambulatory Visit (INDEPENDENT_AMBULATORY_CARE_PROVIDER_SITE_OTHER): Payer: Medicare Other | Admitting: Vascular Surgery

## 2011-08-30 DIAGNOSIS — R222 Localized swelling, mass and lump, trunk: Secondary | ICD-10-CM

## 2011-09-26 ENCOUNTER — Encounter: Payer: Self-pay | Admitting: Vascular Surgery

## 2011-09-27 ENCOUNTER — Encounter: Payer: Self-pay | Admitting: Vascular Surgery

## 2011-09-27 ENCOUNTER — Ambulatory Visit (INDEPENDENT_AMBULATORY_CARE_PROVIDER_SITE_OTHER): Payer: Medicare Other | Admitting: *Deleted

## 2011-09-27 ENCOUNTER — Ambulatory Visit (INDEPENDENT_AMBULATORY_CARE_PROVIDER_SITE_OTHER): Payer: Medicare Other | Admitting: Vascular Surgery

## 2011-09-27 VITALS — BP 152/66 | HR 60 | Resp 16 | Ht 70.5 in | Wt 202.0 lb

## 2011-09-27 DIAGNOSIS — I714 Abdominal aortic aneurysm, without rupture, unspecified: Secondary | ICD-10-CM

## 2011-09-27 DIAGNOSIS — Z9889 Other specified postprocedural states: Secondary | ICD-10-CM

## 2011-09-27 DIAGNOSIS — Z8679 Personal history of other diseases of the circulatory system: Secondary | ICD-10-CM

## 2011-09-27 NOTE — Progress Notes (Signed)
[facility] [place] HISTORY AND PHYSICAL    History of Present Illness:  Patient is a 77 y.o.77 y.o. year old male who presents for follow-up evaluation of AAA. He underwent Gore Excluder aneurysm stent graft repair in 2010. The patient denies new abdominal or back pain.  The patient's atherosclerotic risk factors remain hypertension.  These are all currently stable and followed by his primary care physician. He also complains of occasional palpitations and is followed by Dr. Royann Shivers. He has followup in the near future.  Past Medical History  Diagnosis Date  . Hypertension   . Palpitation   . AAA (abdominal aortic aneurysm)   . Arthritis   . Hiatal hernia   . Dizziness   . GERD (gastroesophageal reflux disease)   . ED (erectile dysfunction)   . Sigmoid diverticulosis   . Thyroid disease   . T12 compression fracture   . Allergic rhinitis   . Chronic venous insufficiency      Past Surgical History  Procedure Date  . Hernia repair   . Rotator cuff repair 2003    left arm  . Abdominal aortic aneurysm repair 5- 6- 10    AAA Stenting       Review of Systems:  Neurologic: denies symptoms of TIA, amaurosis, or stroke Cardiac:denies shortness of breath or chest pain Pulmonary: denies cough or wheeze Abdomen: denies abdominal pain nausea or vomiting  History   Social History  . Marital Status: Widowed    Spouse Name: N/A    Number of Children: N/A  . Years of Education: N/A   Occupational History  . Not on file.   Social History Main Topics  . Smoking status: Former Smoker    Types: Cigarettes    Quit date: 10/22/1977  . Smokeless tobacco: Not on file  . Alcohol Use: 4.2 oz/week    7 Cans of beer per week  . Drug Use: No  . Sexually Active:    Other Topics Concern  . Not on file   Social History Narrative  . No narrative on file    Allergies  Allergen Reactions  . Sinequan (Doxepin Hcl)   . Novocain Itching and Anxiety    Current  Outpatient Prescriptions on File Prior to Visit  Medication Sig Dispense Refill  . aspirin EC 81 MG tablet Take 81 mg by mouth daily.        . Calcium Carbonate-Vitamin D (CALCIUM 600 + D PO) Take by mouth 2 (two) times daily.        . finasteride (PROSCAR) 5 MG tablet Take 5 mg by mouth daily.        . metoprolol (TOPROL-XL) 50 MG 24 hr tablet Take 50 mg by mouth daily.        . Multiple Vitamin (MULTIVITAMIN PO) Take by mouth.        Marland Kitchen omeprazole (PRILOSEC) 20 MG capsule Take 20 mg by mouth daily.        Marland Kitchen triamterene-hydrochlorothiazide (DYAZIDE) 37.5-25 MG per capsule Take 1 capsule by mouth every morning.             Physical Examination    Filed Vitals:   09/27/11 1033  BP: 152/66  Pulse: 60  Resp: 16  Height: 5' 10.5" (1.791 m)  Weight: 202 lb (91.627 kg)  SpO2: 98%     General:  Alert and oriented, no acute distress HEENT: Normal Neck: No bruit or JVD, pulsatile sounding, 2+pulse Pulmonary: Clear to auscultation bilaterally Cardiac:  Regular Rate and Rhythm without murmur Abdomen: Soft, non-tender, non-distended, normal bowel sounds, no pulsatile mass Extremities: 2+ femoral pulses, 2+ pt pulses   DATA:  CT scan of the chest dated 08/30/2011 was reviewed today. This was done for asymptomatic lung nodules. These have been stable over quite some time and it was recommendation of radiologist that no further followup is recommended for these.  He also had an abdominal aortic ultrasound today to evaluate his Gore Excluder stent graft. The aortic diameter has changed from 2.7 cm to 3.8 cm on his exam today. However, no endoleak was detected.  ASSESSMENT:  Doing well status post Gore Excluder stent graft repair aneurysm   PLAN: Patient will return in 1 year to review his stent graft. If the aneurysm diameter continues to change we will consider an arteriogram. We will also obtain a carotid duplex exam of hematocrit next office visit do to the pulsatile type sound of his  left carotid artery as well as his underlying peripheral arterial disease.  Fabienne Bruns, MD [facility] [place] Office: [phone] Pager: [phone]   Fabienne Bruns, MD [facility] [place] Office: [phone] Pager: [phone]

## 2011-10-03 ENCOUNTER — Ambulatory Visit: Payer: Self-pay | Admitting: Urology

## 2011-11-15 NOTE — Procedures (Unsigned)
DUPLEX ULTRASOUND OF ABDOMINAL AORTA  INDICATION:  Abdominal aortic aneurysm stent.  HISTORY: Diabetes:  No. Cardiac:  No. Hypertension:  Yes. Smoking:  No. Connective Tissue Disorder: Family History:  No. Previous Surgery:  AAA stent on 02/24/2009.  DUPLEX EXAM:         AP (cm)                   TRANSVERSE (cm) Proximal             Not visualized            Not visualized Mid                  2.5 cm                    2.5 cm Distal               3.8 cm                    3.8 cm Right Iliac          Not visualized            Not visualized Left Iliac           Not visualized            Not visualized  PREVIOUS:  Date: 08/31/2010 (CT)  AP:  2.7  TRANSVERSE:  2.7  IMPRESSION: 1. Patent abdominal aortic aneurysm stent with no evidence of stenosis     or endoleak, based on limited visualization. 2. Aneurysmal dilatation of the distal abdominal aorta with diameter     measurements appearing larger than previously recorded on CT. 3. Limited visualization of the abdominal aorta and bilateral common     iliac arteries due to acoustic shadowing.  ___________________________________________ Janetta Hora. Fields, MD  CH/MEDQ  D:  10/02/2011  T:  10/02/2011  Job:  [id number]

## 2012-01-11 ENCOUNTER — Other Ambulatory Visit: Payer: Self-pay

## 2012-08-19 DIAGNOSIS — N400 Enlarged prostate without lower urinary tract symptoms: Secondary | ICD-10-CM | POA: Insufficient documentation

## 2012-08-19 DIAGNOSIS — N529 Male erectile dysfunction, unspecified: Secondary | ICD-10-CM | POA: Insufficient documentation

## 2012-08-19 DIAGNOSIS — N433 Hydrocele, unspecified: Secondary | ICD-10-CM | POA: Insufficient documentation

## 2012-08-19 DIAGNOSIS — R972 Elevated prostate specific antigen [PSA]: Secondary | ICD-10-CM | POA: Insufficient documentation

## 2012-09-30 ENCOUNTER — Other Ambulatory Visit: Payer: Self-pay | Admitting: *Deleted

## 2012-09-30 DIAGNOSIS — Z48812 Encounter for surgical aftercare following surgery on the circulatory system: Secondary | ICD-10-CM

## 2012-09-30 DIAGNOSIS — R0989 Other specified symptoms and signs involving the circulatory and respiratory systems: Secondary | ICD-10-CM

## 2012-09-30 DIAGNOSIS — I714 Abdominal aortic aneurysm, without rupture, unspecified: Secondary | ICD-10-CM

## 2012-10-01 ENCOUNTER — Encounter: Payer: Self-pay | Admitting: Vascular Surgery

## 2012-10-02 ENCOUNTER — Ambulatory Visit (INDEPENDENT_AMBULATORY_CARE_PROVIDER_SITE_OTHER): Payer: Medicare Other | Admitting: Vascular Surgery

## 2012-10-02 ENCOUNTER — Other Ambulatory Visit (INDEPENDENT_AMBULATORY_CARE_PROVIDER_SITE_OTHER): Payer: Medicare Other | Admitting: Vascular Surgery

## 2012-10-02 ENCOUNTER — Encounter (INDEPENDENT_AMBULATORY_CARE_PROVIDER_SITE_OTHER): Payer: Medicare Other | Admitting: Vascular Surgery

## 2012-10-02 ENCOUNTER — Encounter: Payer: Self-pay | Admitting: Vascular Surgery

## 2012-10-02 VITALS — BP 142/63 | HR 54 | Ht 70.5 in | Wt 204.0 lb

## 2012-10-02 DIAGNOSIS — R0989 Other specified symptoms and signs involving the circulatory and respiratory systems: Secondary | ICD-10-CM

## 2012-10-02 DIAGNOSIS — I739 Peripheral vascular disease, unspecified: Secondary | ICD-10-CM | POA: Insufficient documentation

## 2012-10-02 DIAGNOSIS — Z48812 Encounter for surgical aftercare following surgery on the circulatory system: Secondary | ICD-10-CM

## 2012-10-02 DIAGNOSIS — I714 Abdominal aortic aneurysm, without rupture, unspecified: Secondary | ICD-10-CM | POA: Insufficient documentation

## 2012-10-02 DIAGNOSIS — I6529 Occlusion and stenosis of unspecified carotid artery: Secondary | ICD-10-CM | POA: Insufficient documentation

## 2012-10-02 NOTE — Addendum Note (Signed)
Addended by: Sharee Pimple on: 10/02/2012 01:32 PM   Modules accepted: Orders

## 2012-10-02 NOTE — Progress Notes (Signed)
Patient is an 78 year old male who returns for followup today. He underwent Gore Excluder stent graft repair of abdominal aortic aneurysm 2010. His aneurysm is 5.4 cm in diameter prior to repair. He has had some left flank pain recently. But this is chronic in nature. He denies any abdominal pain. We have also been following him for a moderate carotid stenosis. He is on aspirin therapy. He denies any symptoms of amaurosis TIA or stroke.  Chronic medical problems remained hypertension, arthritis, reflux, varicose veins all of which are currently stable.  Past Medical History  Diagnosis Date  . Hypertension   . Palpitation   . AAA (abdominal aortic aneurysm)   . Arthritis   . Hiatal hernia   . Dizziness   . GERD (gastroesophageal reflux disease)   . ED (erectile dysfunction)   . Sigmoid diverticulosis   . Thyroid disease   . T12 compression fracture   . Allergic rhinitis   . Chronic venous insufficiency   . Cancer     skin    Review of systems: He denies shortness of breath. He denies chest pain.  Physical exam: Filed Vitals:   10/02/12 1033 10/02/12 1035  BP: 150/59 142/63  Pulse: 54   Height: 5' 10.5" (1.791 m)   Weight: 204 lb (92.534 kg)   SpO2: 98%     Chest: Clear to auscultation bilaterally  Cardiac: Regular rate and rhythm  Abdomen: Soft nontender no pulsatile mass  Extremities: 2+ femoral pulses 2+ posterior tibial pulses bilaterally, scattered varicosities Skin: venous chronic staining medial gaiter area bilaterally  Data: The patient had a duplex of his aorta today which shows aortic diameter is 3.7 cm which is unchanged from 2012. There is no evidence of endoleak. The patient also had a carotid duplex exam today which showed a 60-80% right internal carotid artery stenosis and less than 40% left internal carotid artery stenosis. I reviewed and interpreted both studies.  Assessment: Status post stent graft repair no expansion of aneurysm. Moderate right side  carotid stenosis asymptomatic  Plan: #1 needs repeat duplex of aortic stent graft in one year and yearly forever  #2 needs repeat carotid duplex exam in 6 months time. He will continue his antiplatelet therapy. If he develops any signs symptoms of stroke TIA or amaurosis we would consider right carotid endarterectomy. Otherwise if he remains asymptomatic will continue to observe unless crosses 80%.  Fabienne Bruns, MD [facility] [place] Office: [phone] Pager: [phone]

## 2012-10-30 ENCOUNTER — Other Ambulatory Visit: Payer: Self-pay

## 2013-03-09 ENCOUNTER — Other Ambulatory Visit: Payer: Self-pay | Admitting: Internal Medicine

## 2013-03-09 DIAGNOSIS — M545 Low back pain, unspecified: Secondary | ICD-10-CM

## 2013-03-17 ENCOUNTER — Ambulatory Visit
Admission: RE | Admit: 2013-03-17 | Discharge: 2013-03-17 | Disposition: A | Discharge status: Home / Self Care | Payer: Medicare Other | Source: Ambulatory Visit | Attending: Internal Medicine | Admitting: Internal Medicine

## 2013-03-17 DIAGNOSIS — M545 Low back pain, unspecified: Secondary | ICD-10-CM

## 2013-03-23 ENCOUNTER — Other Ambulatory Visit: Payer: Self-pay | Admitting: Internal Medicine

## 2013-03-23 DIAGNOSIS — M545 Low back pain, unspecified: Secondary | ICD-10-CM

## 2013-04-01 ENCOUNTER — Encounter: Payer: Self-pay | Admitting: Vascular Surgery

## 2013-04-02 ENCOUNTER — Encounter: Payer: Self-pay | Admitting: Vascular Surgery

## 2013-04-02 ENCOUNTER — Other Ambulatory Visit (INDEPENDENT_AMBULATORY_CARE_PROVIDER_SITE_OTHER): Payer: Medicare Other | Admitting: *Deleted

## 2013-04-02 ENCOUNTER — Other Ambulatory Visit: Payer: Self-pay | Admitting: *Deleted

## 2013-04-02 ENCOUNTER — Ambulatory Visit (INDEPENDENT_AMBULATORY_CARE_PROVIDER_SITE_OTHER): Payer: Medicare Other | Admitting: Vascular Surgery

## 2013-04-02 DIAGNOSIS — I714 Abdominal aortic aneurysm, without rupture, unspecified: Secondary | ICD-10-CM

## 2013-04-02 DIAGNOSIS — I6529 Occlusion and stenosis of unspecified carotid artery: Secondary | ICD-10-CM

## 2013-04-02 DIAGNOSIS — Z48812 Encounter for surgical aftercare following surgery on the circulatory system: Secondary | ICD-10-CM

## 2013-04-03 NOTE — Progress Notes (Signed)
Patient is an 79 year old male who returns for followup today. He previously had endovascular stent graft repair of an abdominal aortic aneurysm 2010. He has bilateral moderate carotid stenosis. He continues to deny any symptoms of TIA amaurosis or stroke. He is on aspirin 81 mg once daily. He has some occasional pain in his right knee but denies any claudication symptoms. He has no abdominal or back pain.  Chronic medical problems remain hypertension which is currently stable. Overall he feels healthy and still works full time.  Past Medical History  Diagnosis Date  . Hypertension   . Palpitation   . AAA (abdominal aortic aneurysm)   . Arthritis   . Hiatal hernia   . Dizziness   . GERD (gastroesophageal reflux disease)   . ED (erectile dysfunction)   . Sigmoid diverticulosis   . Thyroid disease   . T12 compression fracture   . Allergic rhinitis   . Chronic venous insufficiency   . Cancer     skin   Review of systems: He denies shortness of breath. He denies chest pain.  Physical exam:  Filed Vitals:   04/02/13 1243 04/02/13 1249  BP: 144/74 142/70  Pulse: 54   Height: 5' 10.5" (1.791 m)   Weight: 198 lb 9.6 oz (90.084 kg)   SpO2: 100%    Chest: Clear to auscultation bilaterally  Neck: No carotid bruits 2+ carotid pulses  Abdomen: Soft nontender nondistended no pulsatile mass  Extremities: 2+ femoral pulses bilaterally 1+ dorsalis pedis pulses bilaterally  Neuro: Symmetric upper extremity and lower extremity motor strength 5 over 5  Data: Patient had a carotid duplex exam today which I reviewed and interpreted. This showed 60-80% right internal carotid artery stenosis but was a higher end of this range with peak systolic velocity 303 cm/s and diastolic 10 8 cm/s less than 40% left internal carotid artery stenosis  Assessment: Unchanged carotid stenosis right side but certainly approaching 80%. This is asymptomatic. I discussed with the patient today options of continued  medical management versus considering carotid endarterectomy. I believe he would benefit from carotid endarterectomy for stroke prophylaxis. He will think about this. If he decides to move forward with carotid endarterectomy he will need cardiac risk stratification prior to this. If he decides.for medical management for now then we will repeat his carotid duplex in 6 months time unless he develops symptoms. If he develops symptoms we would again consider carotid endarterectomy.  He will also need repeat aortic ultrasound in 6 months to reevaluate his aortic aneurysm stent graft.  Plan: See above  Fabienne Bruns, MD [facility] [place] Office: [phone] Pager: [phone]

## 2013-04-09 ENCOUNTER — Other Ambulatory Visit: Payer: Medicare Other

## 2013-04-09 ENCOUNTER — Ambulatory Visit: Payer: Medicare Other | Admitting: Vascular Surgery

## 2013-04-10 ENCOUNTER — Ambulatory Visit
Admission: RE | Admit: 2013-04-10 | Discharge: 2013-04-10 | Disposition: A | Discharge status: Home / Self Care | Payer: Medicare Other | Source: Ambulatory Visit | Attending: Internal Medicine | Admitting: Internal Medicine

## 2013-04-10 DIAGNOSIS — M545 Low back pain, unspecified: Secondary | ICD-10-CM

## 2013-04-10 MED ORDER — IOHEXOL 180 MG/ML  SOLN
1.0000 mL | Freq: Once | INTRAMUSCULAR | Status: AC | PRN
  Administered 2013-04-10: 1 mL via EPIDURAL

## 2013-04-10 MED ORDER — METHYLPREDNISOLONE ACETATE 40 MG/ML INJ SUSP (RADIOLOG
120.0000 mg | Freq: Once | INTRAMUSCULAR | Status: AC
  Administered 2013-04-10: 120 mg via EPIDURAL

## 2013-05-31 ENCOUNTER — Other Ambulatory Visit: Payer: Self-pay | Admitting: Cardiovascular Disease

## 2013-06-01 NOTE — Telephone Encounter (Signed)
Rx was sent to pharmacy electronically. 

## 2013-07-18 ENCOUNTER — Other Ambulatory Visit: Payer: Self-pay | Admitting: Cardiovascular Disease

## 2013-07-20 NOTE — Telephone Encounter (Signed)
Rx was sent to pharmacy electronically. 

## 2013-09-23 ENCOUNTER — Encounter: Payer: Self-pay | Admitting: Vascular Surgery

## 2013-09-24 ENCOUNTER — Ambulatory Visit (INDEPENDENT_AMBULATORY_CARE_PROVIDER_SITE_OTHER): Payer: Medicare Other | Admitting: Vascular Surgery

## 2013-09-24 ENCOUNTER — Other Ambulatory Visit: Payer: Self-pay | Admitting: *Deleted

## 2013-09-24 ENCOUNTER — Ambulatory Visit: Payer: Self-pay | Admitting: Podiatry

## 2013-09-24 ENCOUNTER — Encounter (HOSPITAL_COMMUNITY): Payer: Medicare Other

## 2013-09-24 ENCOUNTER — Other Ambulatory Visit (HOSPITAL_COMMUNITY): Payer: Medicare Other

## 2013-09-24 ENCOUNTER — Encounter: Payer: Self-pay | Admitting: Vascular Surgery

## 2013-09-24 ENCOUNTER — Ambulatory Visit (HOSPITAL_COMMUNITY)
Admission: RE | Admit: 2013-09-24 | Discharge: 2013-09-24 | Disposition: A | Discharge status: Home / Self Care | Payer: Medicare Other | Source: Ambulatory Visit | Attending: Vascular Surgery | Admitting: Vascular Surgery

## 2013-09-24 ENCOUNTER — Other Ambulatory Visit (HOSPITAL_COMMUNITY): Payer: Self-pay

## 2013-09-24 ENCOUNTER — Ambulatory Visit (INDEPENDENT_AMBULATORY_CARE_PROVIDER_SITE_OTHER)
Admission: RE | Admit: 2013-09-24 | Discharge: 2013-09-24 | Disposition: A | Discharge status: Home / Self Care | Payer: Medicare Other | Source: Ambulatory Visit | Attending: Vascular Surgery | Admitting: Vascular Surgery

## 2013-09-24 VITALS — BP 143/61 | HR 60 | Resp 60 | Ht 70.0 in | Wt 196.2 lb

## 2013-09-24 DIAGNOSIS — I729 Aneurysm of unspecified site: Secondary | ICD-10-CM | POA: Insufficient documentation

## 2013-09-24 DIAGNOSIS — I714 Abdominal aortic aneurysm, without rupture, unspecified: Secondary | ICD-10-CM

## 2013-09-24 DIAGNOSIS — Z48812 Encounter for surgical aftercare following surgery on the circulatory system: Secondary | ICD-10-CM

## 2013-09-24 DIAGNOSIS — I6529 Occlusion and stenosis of unspecified carotid artery: Secondary | ICD-10-CM | POA: Insufficient documentation

## 2013-09-24 DIAGNOSIS — Z4889 Encounter for other specified surgical aftercare: Secondary | ICD-10-CM | POA: Insufficient documentation

## 2013-09-24 NOTE — Addendum Note (Signed)
Addended by: Adria Dill L on: 09/24/2013 03:39 PM   Modules accepted: Orders

## 2013-09-24 NOTE — Progress Notes (Signed)
History of Present Illness:  Patient is a 79 y.o. year old male who presents for evaluation of carotid stenosis.   We have been following this for several years. It remains asymptomatic.The patient denies symptoms of TIA, amaurosis, or stroke.  The patient is currently on 81 mg aspirin antiplatelet therapy.  The carotid stenosis was found screening duplex for right-sided carotid bruit.  Other medical problems include hypertension, right shoulder arthritis.  These are currently stable. He has cardiology for followup scheduled with Dr. Jomarie Longs next Monday. We are also following him for previous stent graft repair of bilateral common iliac and infrarenal abdominal aortic aneurysms. His stent graft was placed in 2010. He reports no abdominal or back pain.  Past Medical History  Diagnosis Date  . Hypertension   . Palpitation   . AAA (abdominal aortic aneurysm)   . Arthritis   . Hiatal hernia   . Dizziness   . GERD (gastroesophageal reflux disease)   . ED (erectile dysfunction)   . Sigmoid diverticulosis   . Thyroid disease   . T12 compression fracture   . Allergic rhinitis   . Chronic venous insufficiency   . Cancer     skin  . Carotid artery occlusion     Past Surgical History  Procedure Laterality Date  . Hernia repair    . Rotator cuff repair  2003    left arm  . Abdominal aortic aneurysm repair  5- 6- 10    AAA Stenting     Social History History  Substance Use Topics  . Smoking status: Former Smoker    Types: Cigarettes    Quit date: 10/22/1977  . Smokeless tobacco: Former Neurosurgeon    Quit date: 10/22/1984  . Alcohol Use: 4.2 oz/week    7 Cans of beer per week    Family History Family History  Problem Relation Age of Onset  . Heart disease Mother   . COPD Mother     Allergies  Allergies  Allergen Reactions  . Sinequan [Doxepin Hcl] Other (See Comments)    hallucinations  . Procaine Hcl Other (See Comments)    Tingling all over as soon as it was injected      Current Outpatient Prescriptions  Medication Sig Dispense Refill  . aspirin EC 81 MG tablet Take 81 mg by mouth daily.        . Calcium Carbonate-Vitamin D (CALCIUM 600 + D PO) Take by mouth 2 (two) times daily.        . cyclobenzaprine (FLEXERIL) 10 MG tablet Take 10 mg by mouth daily.      . finasteride (PROSCAR) 5 MG tablet Take 5 mg by mouth daily.        . Fluticasone Propionate, Inhal, 100 MCG/BLIST AEPB Inhale into the lungs daily.        Marland Kitchen gabapentin (NEURONTIN) 100 MG capsule Take 1 capsule by mouth 2 (two) times daily.      . irbesartan (AVAPRO) 150 MG tablet Take 150 mg by mouth at bedtime.        . meclizine (ANTIVERT) 25 MG tablet Take 25 mg by mouth as needed for dizziness.      . meloxicam (MOBIC) 7.5 MG tablet Take 1 tablet by mouth daily.      . metoprolol succinate (TOPROL-XL) 50 MG 24 hr tablet Take 1 tablet (50 mg total) by mouth daily.  90 tablet  0  . Multiple Vitamin (MULTIVITAMIN PO) Take by mouth. ALIVE men's vitamin      .  omeprazole (PRILOSEC) 20 MG capsule Take 20 mg by mouth daily.        Marland Kitchen triamterene-hydrochlorothiazide (DYAZIDE) 37.5-25 MG per capsule Take 1 capsule by mouth every morning.        . triamterene-hydrochlorothiazide (MAXZIDE-25) 37.5-25 MG per tablet take 1 tablet by mouth once daily  90 tablet  1   No current facility-administered medications for this visit.    ROS:   General:  No weight loss, Fever, chills  HEENT: No recent headaches, no nasal bleeding, no visual changes, no sore throat  Neurologic: + dizziness, no blackouts, seizures. No recent symptoms of stroke or mini- stroke. No recent episodes of slurred speech, or temporary blindness.  Cardiac: No recent episodes of chest pain/pressure, no shortness of breath at rest.  No shortness of breath with exertion.  Denies history of atrial fibrillation or irregular heartbeat  Vascular: No history of rest pain in feet.  No history of claudication.  No history of non-healing ulcer, No  history of DVT   Pulmonary: No home oxygen, no productive cough, no hemoptysis,  No asthma or wheezing  Musculoskeletal:  [ ]  Arthritis, [ ]  Low back pain,  [ ]  Joint pain  Hematologic:No history of hypercoagulable state.  No history of easy bleeding.  No history of anemia  Gastrointestinal: No hematochezia or melena,  No gastroesophageal reflux, no trouble swallowing  Urinary: [ ]  chronic Kidney disease, [ ]  on HD - [ ]  MWF or [ ]  TTHS, [ ]  Burning with urination, [ ]  Frequent urination, [ ]  Difficulty urinating;   Skin: No rashes  Psychological: No history of anxiety,  No history of depression   Physical Examination  Filed Vitals:   09/24/13 1143 09/24/13 1146  BP: 151/59 143/61  Pulse: 62 60  Resp:  60  Height:  5\' 10"  (1.778 m)  Weight:  196 lb 3.2 oz (88.996 kg)  SpO2:  100%    Body mass index is 28.15 kg/(m^2).  General:  Alert and oriented, no acute distress HEENT: Normal Neck: Right-sided carotid bruit no left bruit Pulmonary: Clear to auscultation bilaterally Cardiac: Regular Rate and Rhythm without murmur Gastrointestinal: Soft, non-tender, non-distended, no mass Skin: No rash Extremity Pulses:  2+ radial, brachial, femoral pulses bilaterally Musculoskeletal: No deformity or edema  Neurologic: Upper and lower extremity motor 5/5 and symmetric  DATA: Carotid duplex exam today shows progression of his right carotid stenosis a greater than 80%. Left side has less than 40% stenosis. Ultrasound of his abdominal aortic aneurysm shows no endoleak. Aneurysm has shrunk from 5.4 cm to 3.7 cm. The left common iliac aneurysm has shrunk from 3.3 cm to 1.1 cm. The right common iliac aneurysm has also completely shrunk to normal caliber. I reviewed and interpreted this study.   ASSESSMENT: Asymptomatic high-grade right internal carotid artery stenosis   PLAN:  Right carotid endarterectomy in January 2015.  We will ask Dr. Jomarie Longs to cardiac risk stratify him at his  appointment next week. Patient will continue his aspirin 81 mg once daily.   Fabienne Bruns, MD [facility] Office: [phone] Pager: [phone]

## 2013-09-25 ENCOUNTER — Encounter (HOSPITAL_COMMUNITY): Payer: Self-pay | Admitting: *Deleted

## 2013-09-25 NOTE — Addendum Note (Signed)
Addended by: Adria Dill L on: 09/25/2013 12:35 PM   Modules accepted: Orders

## 2013-09-28 ENCOUNTER — Ambulatory Visit: Payer: Medicare Other | Admitting: Cardiovascular Disease

## 2013-09-29 ENCOUNTER — Encounter: Payer: Self-pay | Admitting: Podiatry

## 2013-09-29 ENCOUNTER — Ambulatory Visit (INDEPENDENT_AMBULATORY_CARE_PROVIDER_SITE_OTHER): Payer: Medicare Other | Admitting: Podiatry

## 2013-09-29 VITALS — BP 132/64 | HR 69 | Resp 18

## 2013-09-29 DIAGNOSIS — M79609 Pain in unspecified limb: Secondary | ICD-10-CM

## 2013-09-29 DIAGNOSIS — B351 Tinea unguium: Secondary | ICD-10-CM

## 2013-09-29 NOTE — Progress Notes (Signed)
   Subjective:    Patient ID: Henry Neal, male    DOB: 12/12/1926, 79 y.o.   MRN: [id number]  HPI Comments: " just the nails today "     Review of Systems     Objective:   Physical Exam: Pulses are strongly palpable bilateral no erythema edema saline is drainage or odor. Nails are thick yellow dystrophic clinically mycotic and painful on palpation with sharp incurvated nail margins.        Assessment & Plan:  Assessment: Pain in limb secondary to onychomycosis 1 through 5 bilateral.  Plan: Debridement of nails 1 through 5 bilateral covered service secondary to pain.

## 2013-10-08 ENCOUNTER — Ambulatory Visit: Payer: Medicare Other | Admitting: Vascular Surgery

## 2013-10-08 ENCOUNTER — Ambulatory Visit (HOSPITAL_COMMUNITY)
Admission: RE | Admit: 2013-10-08 | Discharge: 2013-10-08 | Disposition: A | Discharge status: Home / Self Care | Payer: Medicare Other | Source: Ambulatory Visit | Attending: Cardiovascular Disease | Admitting: Cardiovascular Disease

## 2013-10-08 DIAGNOSIS — I252 Old myocardial infarction: Secondary | ICD-10-CM | POA: Insufficient documentation

## 2013-10-08 DIAGNOSIS — I10 Essential (primary) hypertension: Secondary | ICD-10-CM | POA: Insufficient documentation

## 2013-10-08 DIAGNOSIS — R002 Palpitations: Secondary | ICD-10-CM | POA: Insufficient documentation

## 2013-10-08 DIAGNOSIS — I6529 Occlusion and stenosis of unspecified carotid artery: Secondary | ICD-10-CM

## 2013-10-08 DIAGNOSIS — I872 Venous insufficiency (chronic) (peripheral): Secondary | ICD-10-CM | POA: Insufficient documentation

## 2013-10-08 DIAGNOSIS — I739 Peripheral vascular disease, unspecified: Secondary | ICD-10-CM | POA: Insufficient documentation

## 2013-10-08 DIAGNOSIS — E663 Overweight: Secondary | ICD-10-CM | POA: Insufficient documentation

## 2013-10-08 DIAGNOSIS — Z87891 Personal history of nicotine dependence: Secondary | ICD-10-CM | POA: Insufficient documentation

## 2013-10-08 DIAGNOSIS — I714 Abdominal aortic aneurysm, without rupture, unspecified: Secondary | ICD-10-CM

## 2013-10-08 DIAGNOSIS — R079 Chest pain, unspecified: Secondary | ICD-10-CM | POA: Insufficient documentation

## 2013-10-08 DIAGNOSIS — Z48812 Encounter for surgical aftercare following surgery on the circulatory system: Secondary | ICD-10-CM

## 2013-10-08 MED ORDER — AMINOPHYLLINE 25 MG/ML IV SOLN
75.0000 mg | Freq: Once | INTRAVENOUS | Status: AC
  Administered 2013-10-08: 75 mg via INTRAVENOUS

## 2013-10-08 MED ORDER — REGADENOSON 0.4 MG/5ML IV SOLN
0.4000 mg | Freq: Once | INTRAVENOUS | Status: AC
  Administered 2013-10-08: 0.4 mg via INTRAVENOUS

## 2013-10-08 MED ORDER — TECHNETIUM TC 99M SESTAMIBI GENERIC - CARDIOLITE
30.0000 | Freq: Once | INTRAVENOUS | Status: AC | PRN
  Administered 2013-10-08: 30 via INTRAVENOUS

## 2013-10-08 MED ORDER — TECHNETIUM TC 99M SESTAMIBI GENERIC - CARDIOLITE
10.0000 | Freq: Once | INTRAVENOUS | Status: AC | PRN
  Administered 2013-10-08: 10 via INTRAVENOUS

## 2013-10-08 NOTE — Procedures (Addendum)
[facility] [address] [place] [place] [address] [phone]  Cardiology Nuclear Med Study  Henry Neal is a 79 y.o. male     MRN : [id number]     DOB: 17-Nov-1926  Procedure Date: 10/08/2013  Nuclear Med Background Indication for Stress Test:  Evaluation for Ischemia and Surgical Clearance History:  No prior history reported by patient. Cardiac Risk Factors: Carotid Disease, Family History - CAD, History of Smoking, Hypertension, Overweight, PVD and Chronic venous Insuff.  Symptoms:  Chest Pain and Palpitations   Nuclear Pre-Procedure Caffeine/Decaff Intake:  7:00pm NPO After: 5:00am   IV Site: R Hand  IV 0.9% NS with Angio Cath:  22g  Chest Size (in):  42"  IV Started by: Emmit Pomfret, RN  Height: 5\' 10"  (1.778 m)  Cup Size: n/a  BMI:  Body mass index is 28.12 kg/(m^2). Weight:  196 lb (88.905 kg)   Tech Comments:  n/a    Nuclear Med Study 1 or 2 day study: 1 day  Stress Test Type:  Lexiscan  Order Authorizing Provider:  Fabienne Bruns, MD   Resting Radionuclide: Technetium 53m Sestamibi  Resting Radionuclide Dose: 10.6 mCi   Stress Radionuclide:  Technetium 14m Sestamibi  Stress Radionuclide Dose: 31.1 mCi           Stress Protocol Rest HR: 57 Stress HR: 64  Rest BP: 163/74 Stress BP: 154/85  Exercise Time (min): n/a METS: n/a   Predicted Max HR: 136 bpm % Max HR: 53.68 bpm Rate Pressure Product: 65784  Dose of Adenosine (mg):  n/a Dose of Lexiscan: 0.4 mg  Dose of Atropine (mg): n/a Dose of Dobutamine: n/a mcg/kg/min (at max HR)  Stress Test Technologist: Esperanza Sheets, CCT Nuclear Technologist: Koren Shiver, CNMT   Rest Procedure:  Myocardial perfusion imaging was performed at rest 45 minutes following the intravenous administration of Technetium 55m Sestamibi. Stress Procedure:  The patient received IV Lexiscan 0.4 mg over 15-seconds.  Technetium 44m Sestamibi injected at 30-seconds. The patient  experienced SOB, Dizziness and Throat Pressure; 75 mg of IV Aminophylline was administered with resolution of symptoms. There were no significant changes with Lexiscan.  Quantitative spect images were obtained after a 45 minute delay.  Transient Ischemic Dilatation (Normal <1.22):  0.93 Lung/Heart Ratio (Normal <0.45):  0.28 QGS EDV:  117 ml QGS ESV:  63 ml LV Ejection Fraction: 47%  Signed by    Rest ECG: NSR-LBBB  Stress ECG: No significant change from baseline ECG  QPS Raw Data Images:  Normal; no motion artifact; normal heart/lung ratio. Stress Images:  There is decreased uptake in the anterior wall. Rest Images:  There is decreased uptake in the anterior wall. Subtraction (SDS):  There is anteroapical/septal scar with moderate peri-infarct ischemia  Impression Exercise Capacity:  Lexiscan with no exercise. BP Response:  Normal blood pressure response. Clinical Symptoms:  No significant symptoms noted. ECG Impression:  No significant ST segment change suggestive of ischemia. Comparison with Prior Nuclear Study: No images to compare  Overall Impression:  Intermediate risk stress nuclear study Antero-apical/septal scar with moderate peri-infarct ischemia.  LV Wall Motion:  Apical dyskinesia and severe inferior hypokinesia., EF 47%   Runell Gess, MD  10/08/2013 11:08 AM

## 2013-10-09 ENCOUNTER — Encounter: Payer: Self-pay | Admitting: Cardiovascular Disease

## 2013-10-09 ENCOUNTER — Ambulatory Visit
Admission: RE | Admit: 2013-10-09 | Discharge: 2013-10-09 | Disposition: A | Discharge status: Home / Self Care | Payer: Medicare Other | Source: Ambulatory Visit | Attending: Cardiovascular Disease | Admitting: Cardiovascular Disease

## 2013-10-09 ENCOUNTER — Ambulatory Visit (INDEPENDENT_AMBULATORY_CARE_PROVIDER_SITE_OTHER): Payer: Medicare Other | Admitting: Cardiovascular Disease

## 2013-10-09 ENCOUNTER — Encounter (HOSPITAL_COMMUNITY): Payer: Self-pay | Admitting: Pharmacy Technician

## 2013-10-09 VITALS — BP 130/58 | HR 76 | Ht 70.0 in | Wt 196.0 lb

## 2013-10-09 DIAGNOSIS — Z01818 Encounter for other preprocedural examination: Secondary | ICD-10-CM

## 2013-10-09 DIAGNOSIS — D689 Coagulation defect, unspecified: Secondary | ICD-10-CM

## 2013-10-09 DIAGNOSIS — R9439 Abnormal result of other cardiovascular function study: Secondary | ICD-10-CM

## 2013-10-09 DIAGNOSIS — Z79899 Other long term (current) drug therapy: Secondary | ICD-10-CM

## 2013-10-09 DIAGNOSIS — R9430 Abnormal result of cardiovascular function study, unspecified: Secondary | ICD-10-CM | POA: Insufficient documentation

## 2013-10-09 DIAGNOSIS — I779 Disorder of arteries and arterioles, unspecified: Secondary | ICD-10-CM

## 2013-10-09 DIAGNOSIS — R5381 Other malaise: Secondary | ICD-10-CM

## 2013-10-09 DIAGNOSIS — I10 Essential (primary) hypertension: Secondary | ICD-10-CM | POA: Insufficient documentation

## 2013-10-09 DIAGNOSIS — Z01811 Encounter for preprocedural respiratory examination: Secondary | ICD-10-CM

## 2013-10-09 DIAGNOSIS — R5383 Other fatigue: Secondary | ICD-10-CM

## 2013-10-09 LAB — BASIC METABOLIC PANEL
CO2: 25 mEq/L (ref 19–32)
Chloride: 102 mEq/L (ref 96–112)
Glucose, Bld: 90 mg/dL (ref 70–99)
Potassium: 4.5 mEq/L (ref 3.5–5.3)
Sodium: 138 mEq/L (ref 135–145)

## 2013-10-09 LAB — CBC
HCT: 37.9 % — ABNORMAL LOW (ref 39.0–52.0)
Hemoglobin: 13.4 g/dL (ref 13.0–17.0)
MCH: 31.8 pg (ref 26.0–34.0)
MCHC: 35.4 g/dL (ref 30.0–36.0)
MCV: 89.8 fL (ref 78.0–100.0)
RDW: 13.7 % (ref 11.5–15.5)

## 2013-10-09 NOTE — Progress Notes (Signed)
10/09/2013 Henry Neal   08-23-1927  [id number]  Primary Physician Henry Fick, MD Primary Cardiologist: Henry Gess MD Henry Neal   HPI:  Henry Neal is a delightful 79 year old widowed Caucasian male father of 2, developed one grandchild patient of Dr. Royann Neal,, Henry Neal and Henry Neal.he has a history of RRR Henry Neal he is scheduled to undergo elective right carotid endarterectomy by Dr. Darrick Neal in early January. He also has had an endoluminal stent graft placed by Dr. Darrick Neal of May 2000 and for abdominal aortic aneurysm. His crit was factors are positive for 2 hypertension. He has never had a heart attack or stroke. He denies chest pain or shortness of breath. He Myoview stress test from yesterday that showed anteroapical/septal scar with moderate peri-infarct ischemia. Based on this he will need to undergo diagnostic coronary arteriography preferably via the right radial approach to define his anatomy and risk stratify him prior to his elective carotid surgery.   Current Outpatient Prescriptions  Medication Sig Dispense Refill  . aspirin EC 81 MG tablet Take 81 mg by mouth daily.        . Calcium Carbonate-Vitamin D (CALCIUM 600 + D PO) Take by mouth 2 (two) times daily.        . cyclobenzaprine (FLEXERIL) 10 MG tablet Take 10 mg by mouth daily.      . diclofenac (VOLTAREN) 50 MG EC tablet Take 50 mg by mouth 2 (two) times daily.       . finasteride (PROSCAR) 5 MG tablet Take 5 mg by mouth daily.        . fluticasone (FLONASE) 50 MCG/ACT nasal spray Place 1 spray into both nostrils as needed.       . gabapentin (NEURONTIN) 100 MG capsule Take 1 capsule by mouth 2 (two) times daily.      . irbesartan (AVAPRO) 150 MG tablet Take 150 mg by mouth at bedtime.        . meclizine (ANTIVERT) 25 MG tablet Take 25 mg by mouth as needed for dizziness.      . metoprolol succinate (TOPROL-XL) 50 MG 24 hr tablet Take 1 tablet (50 mg total) by mouth daily.  90 tablet  0    . mometasone (ELOCON) 0.1 % cream Apply 1 application topically as needed.       . Multiple Vitamin (MULTIVITAMIN PO) Take by mouth. ALIVE men's vitamin      . omeprazole (PRILOSEC) 20 MG capsule Take 20 mg by mouth daily.        Marland Kitchen triamterene-hydrochlorothiazide (DYAZIDE) 37.5-25 MG per capsule Take 1 capsule by mouth every morning.         No current facility-administered medications for this visit.    Allergies  Allergen Reactions  . Sinequan [Doxepin Hcl] Other (See Comments)    hallucinations  . Verapamil   . Procaine Hcl Other (See Comments)    Tingling all over as soon as it was injected    History   Social History  . Marital Status: Widowed    Spouse Name: N/A    Number of Children: N/A  . Years of Education: N/A   Occupational History  . Not on file.   Social History Main Topics  . Smoking status: Former Smoker    Types: Cigarettes    Quit date: 10/22/1977  . Smokeless tobacco: Former Neurosurgeon    Quit date: 10/22/1984  . Alcohol Use: 4.2 oz/week    7 Cans of beer per week  . Drug Use:  No  . Sexual Activity: Not on file   Other Topics Concern  . Not on file   Social History Narrative  . No narrative on file     Review of Systems: General: negative for chills, fever, night sweats or weight changes.  Cardiovascular: negative for chest pain, dyspnea on exertion, edema, orthopnea, palpitations, paroxysmal nocturnal dyspnea or shortness of breath Dermatological: negative for rash Respiratory: negative for cough or wheezing Urologic: negative for hematuria Abdominal: negative for nausea, vomiting, diarrhea, bright red blood per rectum, melena, or hematemesis Neurologic: negative for visual changes, syncope, or dizziness All other systems reviewed and are otherwise negative except as noted above.    Blood pressure 130/58, pulse 76, height 5\' 10"  (1.778 m), weight 196 lb (88.905 kg).  General appearance: alert and no distress Neck: no adenopathy, no JVD,  supple, symmetrical, trachea midline, thyroid not enlarged, symmetric, no tenderness/mass/nodules and right carotid bruit Lungs: clear to auscultation bilaterally Heart: regular rate and rhythm, S1, S2 normal, no murmur, click, rub or gallop Extremities: extremities normal, atraumatic, no cyanosis or edema  EKG not performed today  ASSESSMENT AND PLAN:   Abnormal stress test The patient had a Myoview stress test ordered by Dr. Leonette Neal fields yesterday for preoperative clearance prior to elective right carotid endarterectomy scheduled to be performed in early January. This revealed disease in the LAD territory with low in her apical/septal scar with moderate peri-infarct ischemia, EF of 47%. This concludes different than his Myoview performed 4 years ago though. Based on this he'll need to undergo cardiac catheterization preferably via the right radial approach. He has had an endoluminal stent graft placed in his abdominal aorta May 2000 and by Dr. Darrick Neal.  Essential hypertension Under good control on current medications      Henry Gess MD [place], [facility] 10/09/2013 11:34 AM

## 2013-10-09 NOTE — Assessment & Plan Note (Signed)
Under good control on current medications 

## 2013-10-09 NOTE — Assessment & Plan Note (Signed)
The patient had a Myoview stress test ordered by Dr. Leonette Most fields yesterday for preoperative clearance prior to elective right carotid endarterectomy scheduled to be performed in early January. This revealed disease in the LAD territory with low in her apical/septal scar with moderate peri-infarct ischemia, EF of 47%. This concludes different than his Myoview performed 4 years ago though. Based on this he'll need to undergo cardiac catheterization preferably via the right radial approach. He has had an endoluminal stent graft placed in his abdominal aorta May 2000 and by Dr. Darrick Penna.

## 2013-10-09 NOTE — Patient Instructions (Signed)
Your physician has requested that you have a cardiac catheterization (radial). Cardiac catheterization is used to diagnose and/or treat various heart conditions. Doctors may recommend this procedure for a number of different reasons. The most common reason is to evaluate chest pain. Chest pain can be a symptom of coronary artery disease (CAD), and cardiac catheterization can show whether plaque is narrowing or blocking your heart's arteries. This procedure is also used to evaluate the valves, as well as measure the blood flow and oxygen levels in different parts of your heart. For further information please visit www.cardiosmart.org.   Following your catheterization, you will not be allowed to drive for 3 days.  No lifting, pushing, or pulling greater that 10 pounds is allowed for 1 week.  When the procedure is scheduled, you will be given a date to have bloodwork and a chest xray done.      

## 2013-10-11 ENCOUNTER — Encounter: Payer: Self-pay | Admitting: *Deleted

## 2013-10-11 MED ORDER — DEXTROSE 5 % IV SOLN
1.5000 g | INTRAVENOUS | Status: DC
  Filled 2013-10-11: qty 1.5

## 2013-10-12 ENCOUNTER — Encounter (HOSPITAL_COMMUNITY)
Admission: RE | Disposition: A | Discharge status: Home / Self Care | Payer: Self-pay | Source: Ambulatory Visit | Attending: Cardiovascular Disease

## 2013-10-12 ENCOUNTER — Ambulatory Visit (HOSPITAL_COMMUNITY)
Admission: RE | Admit: 2013-10-12 | Discharge: 2013-10-12 | Disposition: A | Discharge status: Home / Self Care | Payer: Medicare Other | Source: Ambulatory Visit | Attending: Cardiovascular Disease | Admitting: Cardiovascular Disease

## 2013-10-12 DIAGNOSIS — R9439 Abnormal result of other cardiovascular function study: Secondary | ICD-10-CM

## 2013-10-12 DIAGNOSIS — I714 Abdominal aortic aneurysm, without rupture, unspecified: Secondary | ICD-10-CM

## 2013-10-12 DIAGNOSIS — I779 Disorder of arteries and arterioles, unspecified: Secondary | ICD-10-CM

## 2013-10-12 DIAGNOSIS — Z01818 Encounter for other preprocedural examination: Secondary | ICD-10-CM

## 2013-10-12 DIAGNOSIS — I739 Peripheral vascular disease, unspecified: Secondary | ICD-10-CM

## 2013-10-12 DIAGNOSIS — Z9889 Other specified postprocedural states: Secondary | ICD-10-CM | POA: Insufficient documentation

## 2013-10-12 DIAGNOSIS — I10 Essential (primary) hypertension: Secondary | ICD-10-CM | POA: Insufficient documentation

## 2013-10-12 DIAGNOSIS — I2582 Chronic total occlusion of coronary artery: Secondary | ICD-10-CM | POA: Insufficient documentation

## 2013-10-12 DIAGNOSIS — I2510 Atherosclerotic heart disease of native coronary artery without angina pectoris: Secondary | ICD-10-CM | POA: Insufficient documentation

## 2013-10-12 HISTORY — PX: LEFT HEART CATHETERIZATION WITH CORONARY ANGIOGRAM: SHX5451

## 2013-10-12 SURGERY — LEFT HEART CATHETERIZATION WITH CORONARY ANGIOGRAM
Anesthesia: LOCAL

## 2013-10-12 MED ORDER — SODIUM CHLORIDE 0.9 % IV SOLN
INTRAVENOUS | Status: DC
  Administered 2013-10-12: 11:00:00 via INTRAVENOUS

## 2013-10-12 MED ORDER — ASPIRIN 81 MG PO CHEW: 81.0000 mg | CHEWABLE_TABLET | ORAL | Status: DC

## 2013-10-12 MED ORDER — LIDOCAINE HCL (PF) 1 % IJ SOLN
INTRAMUSCULAR | Status: AC
  Filled 2013-10-12: qty 30

## 2013-10-12 MED ORDER — ACETAMINOPHEN 325 MG PO TABS: 650.0000 mg | ORAL_TABLET | ORAL | Status: DC | PRN

## 2013-10-12 MED ORDER — SODIUM CHLORIDE 0.9 % IV SOLN: INTRAVENOUS | Status: DC

## 2013-10-12 MED ORDER — DIAZEPAM 5 MG PO TABS
5.0000 mg | ORAL_TABLET | ORAL | Status: AC
  Administered 2013-10-12: 5 mg via ORAL
  Filled 2013-10-12: qty 1

## 2013-10-12 MED ORDER — MIDAZOLAM HCL 2 MG/2ML IJ SOLN
INTRAMUSCULAR | Status: AC
  Filled 2013-10-12: qty 2

## 2013-10-12 MED ORDER — HEPARIN (PORCINE) IN NACL 2-0.9 UNIT/ML-% IJ SOLN
INTRAMUSCULAR | Status: AC
  Filled 2013-10-12: qty 1000

## 2013-10-12 MED ORDER — ONDANSETRON HCL 4 MG/2ML IJ SOLN: 4.0000 mg | Freq: Four times a day (QID) | INTRAMUSCULAR | Status: DC | PRN

## 2013-10-12 MED ORDER — FENTANYL CITRATE 0.05 MG/ML IJ SOLN
INTRAMUSCULAR | Status: AC
  Filled 2013-10-12: qty 2

## 2013-10-12 MED ORDER — NITROGLYCERIN 0.2 MG/ML ON CALL CATH LAB
INTRAVENOUS | Status: AC
  Filled 2013-10-12: qty 1

## 2013-10-12 MED ORDER — ASPIRIN 81 MG PO CHEW: 81.0000 mg | CHEWABLE_TABLET | Freq: Every day | ORAL | Status: DC

## 2013-10-12 MED ORDER — SODIUM CHLORIDE 0.9 % IJ SOLN: 3.0000 mL | INTRAMUSCULAR | Status: DC | PRN

## 2013-10-12 MED ORDER — HYDRALAZINE HCL 20 MG/ML IJ SOLN: 10.0000 mg | INTRAMUSCULAR | Status: DC

## 2013-10-12 NOTE — Progress Notes (Signed)
Discharge instruction given per MD order.  Pt and Cg  Was able to verbalize understanding.  Pt to car via wheelchair.

## 2013-10-12 NOTE — Progress Notes (Signed)
Received pt from cath procedure alert and able tolerate  fluids.

## 2013-10-12 NOTE — CV Procedure (Signed)
Henry Neal is a 79 y.o. male    [id number] LOCATION:  FACILITY: [facility]  PHYSICIAN: Nanetta Batty, M.D. [birth year]   DATE OF PROCEDURE:  10/12/2013  DATE OF DISCHARGE:     CARDIAC CATHETERIZATION     History obtained from chart review.[name] is a delightful 79 year old widowed Caucasian male father of 2, grandfather of one grandchild patient of Dr. Royann Shivers,, Darrick Penna and Ramachandran.he has a history of abdominal aortic aneurysm status post endoluminal stent graft.  He is scheduled to undergo elective right carotid endarterectomy by Dr. Darrick Penna in early January. He also has had an endoluminal stent graft placed by Dr. Darrick Penna of May 2000 and for abdominal aortic aneurysm. His cardiovascular risk  factors are positive for hypertension. He has never had a heart attack or stroke. He denies chest pain or shortness of breath. He Myoview stress test from yesterday that showed anteroapical/septal scar with moderate peri-infarct ischemia. Based on this he will need to undergo diagnostic coronary arteriography preferably via the right radial approach to define his anatomy and risk stratify him prior to his elective carotid surgery.    PROCEDURE DESCRIPTION:   The patient was brought to the second floor [facility] cath lab in the postabsorptive state. He was premedicated with Valium 5 mg by mouth, IV Versed and fentanyl. His right groinwas prepped and shaved in usual sterile fashion. Xylocaine 1% was used for local anesthesia. A 5 French sheath was inserted into the right common femoral artery using standard Seldinger technique. 5 French right and left Judkins diagnostic catheters along with a 5 French pigtail catheter were used for selective coronary angiography and left ventriculography respectively. Visipaque dye was used for the entirety of the case. Retrograde aorta, left ventricular pullback pressures were recorded. A total of 65 cc of contrast was administered to the  patient.   HEMODYNAMICS:    AO SYSTOLIC/AO DIASTOLIC: 148/61   LV SYSTOLIC/LV DIASTOLIC: 146/10  ANGIOGRAPHIC RESULTS:   1. Left main; normal  2. LAD; total occluded in the midportion after the second diagonal branch 3. Left circumflex; nondominant with total occlusion of the second marginal branch with faint left to left collaterals. The first obtuse marginal branch arose in the ramus distribution, with small in caliber, moderate in length and had a 75% focal proximal stenosis.  4. Right coronary artery; dominant with a 50-60% proximal stenosis on the first bend. The RCA gave off a faint collaterals to the distal LAD. The PDA had a 70% focal lesion in the midportion and the PLA 1 had a 95% stenosis. This was a small vessel 5. Left ventriculography; RAO left ventriculogram was performed using  25 mL of Visipaque dye at 12 mL/second. 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 His Myoview is intermediate risk. At this point he is minimally symptomatic. I think he can undergo endarterectomy at moderately increased risk given his anatomy and possible social study. Another option would be carotid artery stenting. I have relayed this information to Dr. Darrick Penna, is asked a surgeon. Dr. Royann Shivers review the anatomy with me as well. The sheath was removed and pressure was held on the groin to achieve hemostasis. The patient left the Cath Lab in stable condition. You'll be gently hydrated for the next 4 hours,  discharged home and will followup in our office.  Runell Gess MD, [facility] 10/12/2013 3:23 PM

## 2013-10-13 ENCOUNTER — Ambulatory Visit: Payer: Medicare Other | Admitting: Cardiovascular Disease

## 2013-10-16 NOTE — Pre-Procedure Instructions (Signed)
Henry Neal  10/16/2013   Your procedure is scheduled on:  Tues, Jan 6 @ 7:15 AM  Report to [facility] Short Stay Entrance A at 5:30 AM.  Call this number if you have problems the morning of surgery: [phone]   Remember:   Do not eat food or drink liquids after midnight.   Take these medicines the morning of surgery with A SIP OF WATER: Proscar(Finasteride),Flonase(Fluticasone),Gabapentin(Neurontin),Meclizine(Antivert-if needed),Metoprolol(Toprol),and Omeprazole(Prilosec)              Stop taking your Aspirin. No Goody's,BC's,Aleve,Ibuprofen,Fish Oil,or any Herbal Medications   Do not wear jewelry  Do not wear lotions, powders, or colognes. You may wear deodorant.  Men may shave face and neck.  Do not bring valuables to the hospital.  [facility] is not responsible                  for any belongings or valuables.               Contacts, dentures or bridgework may not be worn into surgery.  Leave suitcase in the car. After surgery it may be brought to your room.  For patients admitted to the hospital, discharge time is determined by your                treatment team.                 Special Instructions: Shower using CHG 2 nights before surgery and the night before surgery.  If you shower the day of surgery use CHG.  Use special wash - you have one bottle of CHG for all showers.  You should use approximately 1/3 of the bottle for each shower.   Please read over the following fact sheets that you were given: Pain Booklet, Coughing and Deep Breathing, Blood Transfusion Information, MRSA Information and Surgical Site Infection Prevention

## 2013-10-19 ENCOUNTER — Encounter (HOSPITAL_COMMUNITY): Payer: Self-pay

## 2013-10-19 ENCOUNTER — Inpatient Hospital Stay (HOSPITAL_COMMUNITY)
Admission: RE | Admit: 2013-10-19 | Discharge: 2013-10-19 | Disposition: A | Discharge status: Home / Self Care | Payer: Medicare Other | Source: Ambulatory Visit

## 2013-10-19 NOTE — Progress Notes (Signed)
     Stress test in epic from 09-24-13  Heart cath in epic from 10-12-13

## 2013-10-23 ENCOUNTER — Encounter (HOSPITAL_COMMUNITY): Payer: Self-pay

## 2013-10-23 ENCOUNTER — Encounter (HOSPITAL_COMMUNITY)
Admission: RE | Admit: 2013-10-23 | Discharge: 2013-10-23 | Disposition: A | Discharge status: Home / Self Care | Payer: Medicare Other | Source: Ambulatory Visit | Attending: Vascular Surgery | Admitting: Vascular Surgery

## 2013-10-23 DIAGNOSIS — Z01812 Encounter for preprocedural laboratory examination: Secondary | ICD-10-CM | POA: Insufficient documentation

## 2013-10-23 DIAGNOSIS — Z01810 Encounter for preprocedural cardiovascular examination: Secondary | ICD-10-CM | POA: Insufficient documentation

## 2013-10-23 DIAGNOSIS — Z01818 Encounter for other preprocedural examination: Secondary | ICD-10-CM | POA: Insufficient documentation

## 2013-10-23 HISTORY — DX: Angina pectoris, unspecified: I20.9

## 2013-10-23 HISTORY — DX: Shortness of breath: R06.02

## 2013-10-23 HISTORY — DX: Atherosclerotic heart disease of native coronary artery without angina pectoris: I25.10

## 2013-10-23 LAB — COMPREHENSIVE METABOLIC PANEL
ALBUMIN: 4 g/dL (ref 3.5–5.2)
ALK PHOS: 70 U/L (ref 39–117)
ALT: 27 U/L (ref 0–53)
AST: 25 U/L (ref 0–37)
BUN: 23 mg/dL (ref 6–23)
CO2: 24 mEq/L (ref 19–32)
Calcium: 9.2 mg/dL (ref 8.4–10.5)
Chloride: 100 mEq/L (ref 96–112)
Creatinine, Ser: 1.03 mg/dL (ref 0.50–1.35)
GFR calc non Af Amer: 65 mL/min — ABNORMAL LOW (ref >90)
GFR, EST AFRICAN AMERICAN: 75 mL/min — AB (ref >90)
Glucose, Bld: 97 mg/dL (ref 70–99)
POTASSIUM: 4.5 meq/L (ref 3.7–5.3)
SODIUM: 141 meq/L (ref 137–147)
TOTAL PROTEIN: 7.3 g/dL (ref 6.0–8.3)
Total Bilirubin: 0.4 mg/dL (ref 0.3–1.2)

## 2013-10-23 LAB — APTT: APTT: 30 s (ref 24–37)

## 2013-10-23 LAB — CBC
HCT: 38.9 % — ABNORMAL LOW (ref 39.0–52.0)
Hemoglobin: 13.6 g/dL (ref 13.0–17.0)
MCH: 32.9 pg (ref 26.0–34.0)
MCHC: 35 g/dL (ref 30.0–36.0)
MCV: 94 fL (ref 78.0–100.0)
Platelets: 184 10*3/uL (ref 150–400)
RBC: 4.14 MIL/uL — ABNORMAL LOW (ref 4.22–5.81)
RDW: 13.3 % (ref 11.5–15.5)
WBC: 8.8 10*3/uL (ref 4.0–10.5)

## 2013-10-23 LAB — SURGICAL PCR SCREEN
MRSA, PCR: NEGATIVE
Staphylococcus aureus: NEGATIVE

## 2013-10-23 LAB — URINALYSIS, ROUTINE W REFLEX MICROSCOPIC
Bilirubin Urine: NEGATIVE
Glucose, UA: NEGATIVE mg/dL
Hgb urine dipstick: NEGATIVE
Ketones, ur: NEGATIVE mg/dL
LEUKOCYTES UA: NEGATIVE
NITRITE: NEGATIVE
PROTEIN: NEGATIVE mg/dL
Specific Gravity, Urine: 1.009 (ref 1.005–1.030)
UROBILINOGEN UA: 0.2 mg/dL (ref 0.0–1.0)
pH: 6.5 (ref 5.0–8.0)

## 2013-10-23 LAB — TYPE AND SCREEN
ABO/RH(D): B POS
Antibody Screen: NEGATIVE

## 2013-10-23 LAB — PROTIME-INR
INR: 1.16 (ref 0.00–1.49)
PROTHROMBIN TIME: 14.6 s (ref 11.6–15.2)

## 2013-10-23 NOTE — Pre-Procedure Instructions (Addendum)
Henry Neal  10/23/2013   Your procedure is scheduled on:  Tues, Jan 6 @ 930 AM  Report to [facility] Short Stay Entrance A at 730 AM.  Call this number if you have problems the morning of surgery: [phone]   Remember:   Do not eat food or drink liquids after midnight.   Take these medicines the morning of surgery with A SIP OF WATER: Proscar(Finasteride),Flonase(Fluticasone),Gabapentin(Neurontin),Meclizine(Antivert-if needed),Metoprolol(Toprol),and Omeprazole(Prilosec)              Stop taking your Aspirin. No Goody's,BC's,Aleve,Ibuprofen,Fish Oil,or any Herbal Medications, including vitamins, voltaren  Stop now   Do not wear jewelry  Do not wear lotions, powders, or colognes. You may wear deodorant.  Men may shave face and neck.  Do not bring valuables to the hospital.  [facility] is not responsible                  for any belongings or valuables.               Contacts, dentures or bridgework may not be worn into surgery.  Leave suitcase in the car. After surgery it may be brought to your room.  For patients admitted to the hospital, discharge time is determined by your                treatment team.                 Special Instructions: Shower using CHG 2 nights before surgery and the night before surgery.  If you shower the day of surgery use CHG.  Use special wash - you have one bottle of CHG for all showers.  You should use approximately 1/3 of the bottle for each shower.   Please read over the following fact sheets that you were given: Pain Booklet, Coughing and Deep Breathing, Blood Transfusion Information, MRSA Information and Surgical Site Infection Prevention

## 2013-10-26 NOTE — Progress Notes (Signed)
Anesthesia Chart Review: Patient is a 80 year old male scheduled for right CEA on 10/27/13 by Dr. Oneida Alar.  History includes reviewed and included AAA s/p EVAR repair '10 and CAD by 09/2013 cath (being treated medically). PCP is Dr. Merrilee Seashore.  Primary cardiologist is listed as Dr. Gwenlyn Found.  He had an abnormal stress test on 10/08/13 that showed anteroapical/septal scare with moderate peri-infarct ischemia, apical dyskinesia with severe inferior hypokinesia, EF 47%.  Cardiac cath was recommended and performed on 10/12/13 with results showing:  1. Left main; normal.  2. LAD; total occluded in the midportion after the second diagonal branch.  3. Left circumflex; nondominant with total occlusion of the second marginal branch with faint left to left collaterals. The first obtuse marginal branch arose in the ramus distribution, with small in caliber, moderate in length and had a 75% focal proximal stenosis.  4. Right coronary artery; dominant with a 50-60% proximal stenosis on the first bend. The RCA gave off a faint collaterals to the distal LAD. The PDA had a 70% focal lesion in the midportion and the PLA 1 had a 95% stenosis. This was a small vessel.  5. Left ventriculography; RAO left ventriculogram was performed using 25 mL of Visipaque dye at 12 mL/second. The overall LVEF estimated 50 % With wall motion abnormalities notable for mild anteroapical hypokinesia. Because he was minimally symptomatic and Myoview was intermediate risk, Dr. Gwenlyn Found thought he could undergo CEA with moderately increased risk.  EKG on 10/23/13 showed SR, QRS widening, question preexcitation pattern, possible anterior infarct (age undetermined).  Carotid duplex on 04/02/13 showed 60-80% RICA (higher end of range) and < 17% LICA stenosis.  CXR on 10/09/13 showed no acute cardiopulmonary disease.  Preoperative labs noted.  If no acute changes then I would anticipate he could proceed as planned with increased CV risk as  outlined.  [facility] Short Stay Center/Anesthesiology Phone [phone] 10/26/2013 11:17 AM

## 2013-10-27 ENCOUNTER — Encounter (HOSPITAL_COMMUNITY): Payer: Medicare Other | Admitting: Vascular Surgery

## 2013-10-27 ENCOUNTER — Inpatient Hospital Stay (HOSPITAL_COMMUNITY)
Admission: RE | Admit: 2013-10-27 | Discharge: 2013-10-30 | DRG: 039 | Disposition: A | Discharge status: Home / Self Care | Payer: Medicare Other | Source: Ambulatory Visit | Attending: Vascular Surgery | Admitting: Vascular Surgery

## 2013-10-27 ENCOUNTER — Inpatient Hospital Stay (HOSPITAL_COMMUNITY): Payer: Medicare Other | Admitting: Certified Registered"

## 2013-10-27 ENCOUNTER — Encounter (HOSPITAL_COMMUNITY)
Admission: RE | Disposition: A | Discharge status: Home / Self Care | Payer: Self-pay | Source: Ambulatory Visit | Attending: Vascular Surgery

## 2013-10-27 ENCOUNTER — Encounter (HOSPITAL_COMMUNITY): Payer: Self-pay | Admitting: Certified Registered"

## 2013-10-27 DIAGNOSIS — Z836 Family history of other diseases of the respiratory system: Secondary | ICD-10-CM

## 2013-10-27 DIAGNOSIS — I498 Other specified cardiac arrhythmias: Secondary | ICD-10-CM | POA: Diagnosis not present

## 2013-10-27 DIAGNOSIS — Z7982 Long term (current) use of aspirin: Secondary | ICD-10-CM

## 2013-10-27 DIAGNOSIS — Z8249 Family history of ischemic heart disease and other diseases of the circulatory system: Secondary | ICD-10-CM

## 2013-10-27 DIAGNOSIS — I2510 Atherosclerotic heart disease of native coronary artery without angina pectoris: Secondary | ICD-10-CM

## 2013-10-27 DIAGNOSIS — K5900 Constipation, unspecified: Secondary | ICD-10-CM | POA: Diagnosis not present

## 2013-10-27 DIAGNOSIS — Z888 Allergy status to other drugs, medicaments and biological substances status: Secondary | ICD-10-CM

## 2013-10-27 DIAGNOSIS — J309 Allergic rhinitis, unspecified: Secondary | ICD-10-CM | POA: Diagnosis present

## 2013-10-27 DIAGNOSIS — K219 Gastro-esophageal reflux disease without esophagitis: Secondary | ICD-10-CM | POA: Diagnosis present

## 2013-10-27 DIAGNOSIS — I447 Left bundle-branch block, unspecified: Secondary | ICD-10-CM

## 2013-10-27 DIAGNOSIS — I6529 Occlusion and stenosis of unspecified carotid artery: Secondary | ICD-10-CM

## 2013-10-27 DIAGNOSIS — I10 Essential (primary) hypertension: Secondary | ICD-10-CM | POA: Diagnosis present

## 2013-10-27 DIAGNOSIS — Z79899 Other long term (current) drug therapy: Secondary | ICD-10-CM

## 2013-10-27 DIAGNOSIS — Z87891 Personal history of nicotine dependence: Secondary | ICD-10-CM

## 2013-10-27 DIAGNOSIS — I252 Old myocardial infarction: Secondary | ICD-10-CM

## 2013-10-27 DIAGNOSIS — R079 Chest pain, unspecified: Secondary | ICD-10-CM

## 2013-10-27 HISTORY — PX: ENDARTERECTOMY: SHX5162

## 2013-10-27 LAB — CBC
HCT: 32.5 % — ABNORMAL LOW (ref 39.0–52.0)
Hemoglobin: 11.1 g/dL — ABNORMAL LOW (ref 13.0–17.0)
MCH: 32.2 pg (ref 26.0–34.0)
MCHC: 34.2 g/dL (ref 30.0–36.0)
MCV: 94.2 fL (ref 78.0–100.0)
PLATELETS: 147 10*3/uL — AB (ref 150–400)
RBC: 3.45 MIL/uL — ABNORMAL LOW (ref 4.22–5.81)
RDW: 13.4 % (ref 11.5–15.5)
WBC: 9.2 10*3/uL (ref 4.0–10.5)

## 2013-10-27 LAB — CREATININE, SERUM
Creatinine, Ser: 1.02 mg/dL (ref 0.50–1.35)
GFR calc Af Amer: 76 mL/min — ABNORMAL LOW (ref >90)
GFR calc non Af Amer: 65 mL/min — ABNORMAL LOW (ref >90)

## 2013-10-27 SURGERY — ENDARTERECTOMY, CAROTID
Anesthesia: General | Site: Neck | Laterality: Right

## 2013-10-27 MED ORDER — MORPHINE SULFATE 2 MG/ML IJ SOLN
2.0000 mg | INTRAMUSCULAR | Status: DC | PRN
  Administered 2013-10-27 (×2): 2 mg via INTRAVENOUS

## 2013-10-27 MED ORDER — LACTATED RINGERS IV SOLN
INTRAVENOUS | Status: DC
  Administered 2013-10-27 (×2): via INTRAVENOUS

## 2013-10-27 MED ORDER — POTASSIUM CHLORIDE CRYS ER 20 MEQ PO TBCR: 20.0000 meq | EXTENDED_RELEASE_TABLET | Freq: Every day | ORAL | Status: DC | PRN

## 2013-10-27 MED ORDER — IRBESARTAN 150 MG PO TABS
150.0000 mg | ORAL_TABLET | Freq: Every morning | ORAL | Status: DC
  Administered 2013-10-29 – 2013-10-30 (×2): 150 mg via ORAL
  Filled 2013-10-27 (×3): qty 1

## 2013-10-27 MED ORDER — SODIUM CHLORIDE 0.9 % IV SOLN
INTRAVENOUS | Status: DC
Start: 2013-10-27 — End: 2013-10-29
  Administered 2013-10-28: 04:00:00 via INTRAVENOUS

## 2013-10-27 MED ORDER — GLYCOPYRROLATE 0.2 MG/ML IJ SOLN
INTRAMUSCULAR | Status: DC | PRN
  Administered 2013-10-27: 0.2 mg via INTRAVENOUS
  Administered 2013-10-27: 0.4 mg via INTRAVENOUS

## 2013-10-27 MED ORDER — CYCLOBENZAPRINE HCL 10 MG PO TABS: 10.0000 mg | ORAL_TABLET | Freq: Three times a day (TID) | ORAL | Status: DC | PRN

## 2013-10-27 MED ORDER — DEXTROSE 5 % IV SOLN
1.5000 g | Freq: Two times a day (BID) | INTRAVENOUS | Status: AC
  Administered 2013-10-27 – 2013-10-28 (×2): 1.5 g via INTRAVENOUS
  Filled 2013-10-27 (×2): qty 1.5

## 2013-10-27 MED ORDER — ARTIFICIAL TEARS OP OINT
TOPICAL_OINTMENT | OPHTHALMIC | Status: DC | PRN
  Administered 2013-10-27: 1 via OPHTHALMIC

## 2013-10-27 MED ORDER — ONDANSETRON HCL 4 MG/2ML IJ SOLN: 4.0000 mg | Freq: Four times a day (QID) | INTRAMUSCULAR | Status: DC | PRN

## 2013-10-27 MED ORDER — MECLIZINE HCL 25 MG PO TABS
25.0000 mg | ORAL_TABLET | Freq: Two times a day (BID) | ORAL | Status: DC | PRN
  Filled 2013-10-27: qty 1

## 2013-10-27 MED ORDER — LIDOCAINE HCL (CARDIAC) 20 MG/ML IV SOLN
INTRAVENOUS | Status: DC | PRN
  Administered 2013-10-27: 100 mg via INTRAVENOUS

## 2013-10-27 MED ORDER — HYDROMORPHONE HCL PF 1 MG/ML IJ SOLN: 0.2500 mg | INTRAMUSCULAR | Status: DC | PRN

## 2013-10-27 MED ORDER — ACETAMINOPHEN 325 MG PO TABS: 325.0000 mg | ORAL_TABLET | ORAL | Status: DC | PRN

## 2013-10-27 MED ORDER — PROTAMINE SULFATE 10 MG/ML IV SOLN
INTRAVENOUS | Status: DC | PRN
  Administered 2013-10-27: 20 mg via INTRAVENOUS
  Administered 2013-10-27: 10 mg via INTRAVENOUS
  Administered 2013-10-27 (×2): 20 mg via INTRAVENOUS
  Administered 2013-10-27: 30 mg via INTRAVENOUS

## 2013-10-27 MED ORDER — OXYCODONE HCL 5 MG/5ML PO SOLN: 5.0000 mg | Freq: Once | ORAL | Status: DC | PRN

## 2013-10-27 MED ORDER — ONDANSETRON HCL 4 MG/2ML IJ SOLN: 4.0000 mg | Freq: Once | INTRAMUSCULAR | Status: DC | PRN

## 2013-10-27 MED ORDER — SODIUM CHLORIDE 0.9 % IR SOLN
Status: DC | PRN
  Administered 2013-10-27: 09:00:00

## 2013-10-27 MED ORDER — SODIUM CHLORIDE 0.9 % IV BOLUS (SEPSIS): 500.0000 mL | Freq: Once | INTRAVENOUS | Status: AC | PRN

## 2013-10-27 MED ORDER — SODIUM CHLORIDE 0.9 % IV SOLN
500.0000 mL | Freq: Once | INTRAVENOUS | Status: AC | PRN
  Administered 2013-10-27: 500 mL via INTRAVENOUS

## 2013-10-27 MED ORDER — ASPIRIN EC 81 MG PO TBEC
81.0000 mg | DELAYED_RELEASE_TABLET | Freq: Every day | ORAL | Status: DC
  Administered 2013-10-27 – 2013-10-30 (×4): 81 mg via ORAL
  Filled 2013-10-27 (×4): qty 1

## 2013-10-27 MED ORDER — GUAIFENESIN-DM 100-10 MG/5ML PO SYRP: 15.0000 mL | ORAL_SOLUTION | ORAL | Status: DC | PRN

## 2013-10-27 MED ORDER — ONDANSETRON HCL 4 MG/2ML IJ SOLN
INTRAMUSCULAR | Status: DC | PRN
  Administered 2013-10-27: 4 mg via INTRAVENOUS

## 2013-10-27 MED ORDER — OXYCODONE HCL 5 MG PO TABS: 5.0000 mg | ORAL_TABLET | Freq: Once | ORAL | Status: DC | PRN

## 2013-10-27 MED ORDER — LABETALOL HCL 5 MG/ML IV SOLN
10.0000 mg | INTRAVENOUS | Status: DC | PRN
  Filled 2013-10-27: qty 4

## 2013-10-27 MED ORDER — DOPAMINE-DEXTROSE 3.2-5 MG/ML-% IV SOLN
INTRAVENOUS | Status: AC
  Administered 2013-10-27: 800 mg via INTRAVENOUS
  Filled 2013-10-27: qty 250

## 2013-10-27 MED ORDER — ATORVASTATIN CALCIUM 10 MG PO TABS: 10.0000 mg | ORAL_TABLET | Freq: Every day | ORAL | Status: DC

## 2013-10-27 MED ORDER — PHENOL 1.4 % MT LIQD: 1.0000 | OROMUCOSAL | Status: DC | PRN

## 2013-10-27 MED ORDER — DOCUSATE SODIUM 100 MG PO CAPS
100.0000 mg | ORAL_CAPSULE | Freq: Every day | ORAL | Status: DC
  Administered 2013-10-28 – 2013-10-30 (×3): 100 mg via ORAL
  Filled 2013-10-27 (×3): qty 1

## 2013-10-27 MED ORDER — SODIUM CHLORIDE 0.9 % IV SOLN
INTRAVENOUS | Status: DC
  Administered 2013-10-27: 1000 mL via INTRAVENOUS
  Administered 2013-10-27: 500 mL via INTRAVENOUS

## 2013-10-27 MED ORDER — ACETAMINOPHEN 325 MG PO TABS
325.0000 mg | ORAL_TABLET | ORAL | Status: DC | PRN
  Administered 2013-10-28: 650 mg via ORAL
  Filled 2013-10-27: qty 2

## 2013-10-27 MED ORDER — FINASTERIDE 5 MG PO TABS
5.0000 mg | ORAL_TABLET | Freq: Every day | ORAL | Status: DC
  Administered 2013-10-28 – 2013-10-30 (×3): 5 mg via ORAL
  Filled 2013-10-27 (×4): qty 1

## 2013-10-27 MED ORDER — FENTANYL CITRATE 0.05 MG/ML IJ SOLN
INTRAMUSCULAR | Status: DC | PRN
  Administered 2013-10-27: 100 ug via INTRAVENOUS

## 2013-10-27 MED ORDER — DEXTROSE 5 % IV SOLN
1.5000 g | INTRAVENOUS | Status: AC
  Administered 2013-10-27: 1.5 g via INTRAVENOUS
  Filled 2013-10-27: qty 1.5

## 2013-10-27 MED ORDER — ROCURONIUM BROMIDE 100 MG/10ML IV SOLN
INTRAVENOUS | Status: DC | PRN
  Administered 2013-10-27: 50 mg via INTRAVENOUS

## 2013-10-27 MED ORDER — METOPROLOL TARTRATE 1 MG/ML IV SOLN: 2.0000 mg | INTRAVENOUS | Status: DC | PRN

## 2013-10-27 MED ORDER — ATORVASTATIN CALCIUM 10 MG PO TABS
10.0000 mg | ORAL_TABLET | Freq: Every day | ORAL | Status: DC
  Administered 2013-10-28 – 2013-10-29 (×2): 10 mg via ORAL
  Filled 2013-10-27 (×3): qty 1

## 2013-10-27 MED ORDER — MOMETASONE FUROATE 0.1 % EX CREA
1.0000 "application " | TOPICAL_CREAM | Freq: Every day | CUTANEOUS | Status: DC | PRN
  Filled 2013-10-27: qty 15

## 2013-10-27 MED ORDER — SODIUM CHLORIDE 0.9 % IV SOLN
10.0000 mg | INTRAVENOUS | Status: DC | PRN
  Administered 2013-10-27: 20 ug/min via INTRAVENOUS

## 2013-10-27 MED ORDER — OXYCODONE HCL 5 MG PO TABS: 5.0000 mg | ORAL_TABLET | ORAL | Status: DC | PRN

## 2013-10-27 MED ORDER — FLUTICASONE PROPIONATE 50 MCG/ACT NA SUSP
1.0000 | Freq: Every day | NASAL | Status: DC | PRN
  Filled 2013-10-27: qty 16

## 2013-10-27 MED ORDER — THROMBIN 20000 UNITS EX SOLR
CUTANEOUS | Status: AC
  Filled 2013-10-27: qty 20000

## 2013-10-27 MED ORDER — GABAPENTIN 100 MG PO CAPS
100.0000 mg | ORAL_CAPSULE | Freq: Two times a day (BID) | ORAL | Status: DC
  Administered 2013-10-27 – 2013-10-30 (×6): 100 mg via ORAL
  Filled 2013-10-27 (×7): qty 1

## 2013-10-27 MED ORDER — EPHEDRINE SULFATE 50 MG/ML IJ SOLN
INTRAMUSCULAR | Status: DC | PRN
  Administered 2013-10-27: 5 mg via INTRAVENOUS
  Administered 2013-10-27 (×2): 10 mg via INTRAVENOUS

## 2013-10-27 MED ORDER — MORPHINE SULFATE 2 MG/ML IJ SOLN
INTRAMUSCULAR | Status: AC
  Filled 2013-10-27: qty 1

## 2013-10-27 MED ORDER — NEOSTIGMINE METHYLSULFATE 1 MG/ML IJ SOLN
INTRAMUSCULAR | Status: DC | PRN
  Administered 2013-10-27: 3 mg via INTRAVENOUS

## 2013-10-27 MED ORDER — SODIUM CHLORIDE 0.9 % IV BOLUS (SEPSIS)
500.0000 mL | Freq: Once | INTRAVENOUS | Status: AC
  Administered 2013-10-27: 500 mL via INTRAVENOUS

## 2013-10-27 MED ORDER — METOPROLOL SUCCINATE ER 50 MG PO TB24
50.0000 mg | ORAL_TABLET | Freq: Every day | ORAL | Status: DC
  Administered 2013-10-29 – 2013-10-30 (×2): 50 mg via ORAL
  Filled 2013-10-27 (×4): qty 1

## 2013-10-27 MED ORDER — 0.9 % SODIUM CHLORIDE (POUR BTL) OPTIME
TOPICAL | Status: DC | PRN
  Administered 2013-10-27: 1000 mL

## 2013-10-27 MED ORDER — PROPOFOL 10 MG/ML IV BOLUS
INTRAVENOUS | Status: DC | PRN
  Administered 2013-10-27: 158 mg via INTRAVENOUS

## 2013-10-27 MED ORDER — ENOXAPARIN SODIUM 30 MG/0.3ML ~~LOC~~ SOLN
30.0000 mg | SUBCUTANEOUS | Status: DC
  Administered 2013-10-28 – 2013-10-29 (×2): 30 mg via SUBCUTANEOUS
  Filled 2013-10-27 (×2): qty 0.3

## 2013-10-27 MED ORDER — DOPAMINE-DEXTROSE 3.2-5 MG/ML-% IV SOLN: 3.0000 ug/kg/min | INTRAVENOUS | Status: DC

## 2013-10-27 MED ORDER — PANTOPRAZOLE SODIUM 40 MG PO TBEC
40.0000 mg | DELAYED_RELEASE_TABLET | Freq: Every day | ORAL | Status: DC
  Administered 2013-10-27 – 2013-10-30 (×4): 40 mg via ORAL
  Filled 2013-10-27 (×4): qty 1

## 2013-10-27 MED ORDER — ACETAMINOPHEN 160 MG/5ML PO SOLN
325.0000 mg | ORAL | Status: DC | PRN
  Filled 2013-10-27: qty 20.3

## 2013-10-27 MED ORDER — HYDRALAZINE HCL 20 MG/ML IJ SOLN: 10.0000 mg | INTRAMUSCULAR | Status: DC | PRN

## 2013-10-27 MED ORDER — OXYCODONE HCL 5 MG PO TABS: 5.0000 mg | ORAL_TABLET | Freq: Four times a day (QID) | ORAL | Status: DC | PRN

## 2013-10-27 MED ORDER — ALUM & MAG HYDROXIDE-SIMETH 200-200-20 MG/5ML PO SUSP
15.0000 mL | ORAL | Status: DC | PRN
  Administered 2013-10-27: 30 mL via ORAL
  Filled 2013-10-27: qty 30

## 2013-10-27 MED ORDER — TRIAMTERENE-HCTZ 37.5-25 MG PO CAPS
1.0000 | ORAL_CAPSULE | ORAL | Status: DC
  Administered 2013-10-29: 1 via ORAL
  Filled 2013-10-27 (×4): qty 1

## 2013-10-27 MED ORDER — LIDOCAINE HCL (PF) 1 % IJ SOLN
INTRAMUSCULAR | Status: AC
  Filled 2013-10-27: qty 30

## 2013-10-27 MED ORDER — HEPARIN SODIUM (PORCINE) 1000 UNIT/ML IJ SOLN
INTRAMUSCULAR | Status: DC | PRN
  Administered 2013-10-27: 10000 [IU] via INTRAVENOUS

## 2013-10-27 MED ORDER — ACETAMINOPHEN 650 MG RE SUPP: 325.0000 mg | RECTAL | Status: DC | PRN

## 2013-10-27 SURGICAL SUPPLY — 47 items
ADH SKN CLS APL DERMABOND .7 (GAUZE/BANDAGES/DRESSINGS) ×1
ADH SKN CLS LQ APL DERMABOND (GAUZE/BANDAGES/DRESSINGS) ×1
CANISTER SUCTION 2500CC (MISCELLANEOUS) ×2 IMPLANT
CANNULA VESSEL 3MM 2 BLNT TIP (CANNULA) ×2 IMPLANT
CATH ROBINSON RED A/P 18FR (CATHETERS) ×2 IMPLANT
CLIP TI MEDIUM 6 (CLIP) ×2 IMPLANT
CLIP TI WIDE RED SMALL 6 (CLIP) ×2 IMPLANT
COVER SURGICAL LIGHT HANDLE (MISCELLANEOUS) ×2 IMPLANT
CRADLE DONUT ADULT HEAD (MISCELLANEOUS) ×2 IMPLANT
DECANTER SPIKE VIAL GLASS SM (MISCELLANEOUS) IMPLANT
DERMABOND ADHESIVE PROPEN (GAUZE/BANDAGES/DRESSINGS) ×1
DERMABOND ADVANCED (GAUZE/BANDAGES/DRESSINGS) ×1
DERMABOND ADVANCED .7 DNX12 (GAUZE/BANDAGES/DRESSINGS) ×1 IMPLANT
DERMABOND ADVANCED .7 DNX6 (GAUZE/BANDAGES/DRESSINGS) IMPLANT
DRAIN HEMOVAC 1/8 X 5 (WOUND CARE) IMPLANT
DRAPE WARM FLUID 44X44 (DRAPE) ×2 IMPLANT
ELECT REM PT RETURN 9FT ADLT (ELECTROSURGICAL) ×2
ELECTRODE REM PT RTRN 9FT ADLT (ELECTROSURGICAL) ×1 IMPLANT
EVACUATOR SILICONE 100CC (DRAIN) IMPLANT
GEL ULTRASOUND 20GR AQUASONIC (MISCELLANEOUS) IMPLANT
GLOVE BIO SURGEON STRL SZ7.5 (GLOVE) ×2 IMPLANT
GOWN PREVENTION PLUS XLARGE (GOWN DISPOSABLE) ×2 IMPLANT
GOWN STRL NON-REIN LRG LVL3 (GOWN DISPOSABLE) ×6 IMPLANT
KIT BASIN OR (CUSTOM PROCEDURE TRAY) ×2 IMPLANT
KIT ROOM TURNOVER OR (KITS) ×2 IMPLANT
LOOP VESSEL MINI RED (MISCELLANEOUS) IMPLANT
NDL HYPO 25GX1X1/2 BEV (NEEDLE) IMPLANT
NEEDLE HYPO 25GX1X1/2 BEV (NEEDLE) IMPLANT
NS IRRIG 1000ML POUR BTL (IV SOLUTION) ×4 IMPLANT
PACK CAROTID (CUSTOM PROCEDURE TRAY) ×2 IMPLANT
PAD ARMBOARD 7.5X6 YLW CONV (MISCELLANEOUS) ×4 IMPLANT
PATCH HEMASHIELD 8X75 (Vascular Products) ×1 IMPLANT
SHUNT CAROTID BYPASS 10 (VASCULAR PRODUCTS) ×1 IMPLANT
SHUNT CAROTID BYPASS 12FRX15.5 (VASCULAR PRODUCTS) IMPLANT
SPONGE SURGIFOAM ABS GEL 100 (HEMOSTASIS) IMPLANT
SUT ETHILON 3 0 PS 1 (SUTURE) IMPLANT
SUT PROLENE 6 0 CC (SUTURE) ×2 IMPLANT
SUT PROLENE 7 0 BV 1 (SUTURE) IMPLANT
SUT SILK 3 0 TIES 17X18 (SUTURE)
SUT SILK 3-0 18XBRD TIE BLK (SUTURE) IMPLANT
SUT VIC AB 3-0 SH 27 (SUTURE) ×2
SUT VIC AB 3-0 SH 27X BRD (SUTURE) ×1 IMPLANT
SUT VICRYL 4-0 PS2 18IN ABS (SUTURE) ×2 IMPLANT
SYR CONTROL 10ML LL (SYRINGE) IMPLANT
TOWEL OR 17X24 6PK STRL BLUE (TOWEL DISPOSABLE) ×2 IMPLANT
TOWEL OR 17X26 10 PK STRL BLUE (TOWEL DISPOSABLE) ×2 IMPLANT
WATER STERILE IRR 1000ML POUR (IV SOLUTION) ×2 IMPLANT

## 2013-10-27 NOTE — Anesthesia Procedure Notes (Signed)
Procedure Name: Intubation Date/Time: 10/27/2013 10:23 AM Performed by: Julian Reil Pre-anesthesia Checklist: Patient identified, Emergency Drugs available, Suction available and Patient being monitored Patient Re-evaluated:Patient Re-evaluated prior to inductionOxygen Delivery Method: Circle system utilized Preoxygenation: Pre-oxygenation with 100% oxygen Intubation Type: IV induction Ventilation: Mask ventilation without difficulty and Oral airway inserted - appropriate to patient size Laryngoscope Size: Mac and 4 Grade View: Grade I Tube type: Oral Tube size: 7.5 mm Number of attempts: 1 Airway Equipment and Method: Stylet Placement Confirmation: ETT inserted through vocal cords under direct vision,  positive ETCO2 and breath sounds checked- equal and bilateral Secured at: 22 cm Tube secured with: Tape Dental Injury: Teeth and Oropharynx as per pre-operative assessment

## 2013-10-27 NOTE — Transfer of Care (Signed)
Immediate Anesthesia Transfer of Care Note  Patient: Henry Neal  Procedure(s) Performed: Procedure(s): ENDARTERECTOMY CAROTID (Right)  Patient Location: PACU  Anesthesia Type:General  Level of Consciousness: awake, alert , oriented and patient cooperative  Airway & Oxygen Therapy: Patient Spontanous Breathing and Patient connected to nasal cannula oxygen  Post-op Assessment: Report given to PACU RN, Post -op Vital signs reviewed and stable and Patient moving all extremities  Post vital signs: Reviewed and stable  Complications: No apparent anesthesia complications

## 2013-10-27 NOTE — Op Note (Signed)
Procedure Right carotid endarterectomy  Preoperative diagnosis: High-grade symptomatic right internal carotid artery stenosis  Postoperative diagnosis: Same  Anesthesia General  Asst.: Leontine Locket, PAC  Operative findings: #1 greater than 80% right internal carotid stenosis                                                          #2 Dacron patch           #3 10 Fr shunt  Operative details: After obtaining informed consent, the patient was taken to the operating room. The patient was placed in a supine position on the operating room table. After induction of general anesthesia and endotracheal intubation a Foley catheter was placed. Next the patient's entire neck and chest was prepped and draped in the usual sterile fashion. An oblique incision was made on the right aspect of the patient's neck anterior to the border the right sternocleidomastoid muscle. The incision was carried into the subcutaneous tissues and through the platysma. The sternocleidomastoid muscle was identified and reflected laterally. The omohyoid muscle was identified and this was divided with cautery. The common carotid artery was then found at the base of the incision this was dissected free circumferentially. It was fairly soft on palpation.  The vagus nerve was identified and protected. Dissection was then carried up to the level carotid bifurcation.   The hyperglossal nerve was above the primary area of dissection but it did have to be mobilized to allow room for the shunt.  The ansa cervicalis was divided to provide more exposure. The internal carotid artery was dissected free circumferentially just below the level of the hypoglossal nerve and it was soft in character at this location and above any palpable disease. A vessel loop was placed around this. Next the external carotid and superior thyroid arteries were dissected free circumferentially and vessel loops were placed around these. The patient was given 10000 units of  intravenous heparin.  After 2 minutes of circulation time and raising the mean arterial pressure to 90 mm mercury, the distal internal carotid artery was controlled with small bulldog clamp. The external carotid and superior thyroid arteries were controlled with vessel loops. The common carotid artery was controlled with a peripheral DeBakey clamp. A longitudinal opening was made in the common carotid artery just below the bifurcation. The arteriotomy was extended distally up into the internal carotid with Potts scissors. There was a large calcified plaque with greater than 80% stenosis in the internal carotid. An 10 Fr shunt was brought onto the field was threaded into the distal internal carotid artery and allowed to backbleed thoroughly.  There was pulsatile backbleeding.  This was then threaded into the common carotid and secured with a Rummel tourniquet.  The shunt was free of air and unclamped with flow was restored to the brain.  Attention was then turned to the common carotid artery once again. A suitable endarterectomy plane was obtained and endarterectomy was begun in the common carotid artery and a good proximal endpoint was obtained. An eversion endarterectomy was performed on the external carotid artery and a good endpoint was obtained. The plaque was then elevated in the internal carotid artery and a nice feathered distal endpoint was also obtained.  The plaque was passed off the table. All loose debris was then removed from the carotid  bed and everything was thoroughly irrigated with heparinized saline. A Dacron patch was then brought on to the operative field and this was sewn on as a patch angioplasty using a running 6-0 Prolene suture. Prior to completion of the anastomosis the internal carotid artery was thoroughly backbled. This was then controlled again with a fine bulldog clamp.  The common carotid was thoroughly flushed forward. The external carotid was also thoroughly backbled.  The remainder  of the patch was completed and the anastomosis was secured. Flow was then restored first retrograde from the external carotid into the carotid bed then antegrade from the common carotid to the external carotid artery and after approximately 5 cardiac cycles to the internal carotid artery. Doppler was used to evaluate the external/internal and common carotid arteries and these all had good Doppler flow. Hemostasis was obtained. The patient was also given 100 mg of Protamine.      The platysma muscle was reapproximated using a running 3-0 Vicryl suture. The skin was closed with 4 0 Vicryl subcuticular stitch.  The patient was awakened in the operating room and was moving upper and lower extremities symmetrically and following commands.  The patient was stable on arrival to the PACU.  Ruta Hinds, MD [facility] [place] Office: [phone] Pager: [phone]

## 2013-10-27 NOTE — H&P (Signed)
History of Present Illness: Patient is a 80 y.o. year old male who presents for evaluation of carotid stenosis. We have been following this for several years. It remains asymptomatic.The patient denies symptoms of TIA, amaurosis, or stroke. The patient is currently on 81 mg aspirin antiplatelet therapy. The carotid stenosis was found screening duplex for right-sided carotid bruit. Other medical problems include hypertension, right shoulder arthritis. These are currently stable. He has cardiology for followup scheduled with Dr. Orene Desanctis next Monday. We are also following him for previous stent graft repair of bilateral common iliac and infrarenal abdominal aortic aneurysms. His stent graft was placed in 2010. He reports no abdominal or back pain.  Past Medical History   Diagnosis  Date   .  Hypertension    .  Palpitation    .  AAA (abdominal aortic aneurysm)    .  Arthritis    .  Hiatal hernia    .  Dizziness    .  GERD (gastroesophageal reflux disease)    .  ED (erectile dysfunction)    .  Sigmoid diverticulosis    .  Thyroid disease    .  T12 compression fracture    .  Allergic rhinitis    .  Chronic venous insufficiency    .  Cancer      skin   .  Carotid artery occlusion     Past Surgical History   Procedure  Laterality  Date   .  Hernia repair     .  Rotator cuff repair   2003     left arm   .  Abdominal aortic aneurysm repair   5- 6- 10     AAA Stenting   Social History  History   Substance Use Topics   .  Smoking status:  Former Smoker     Types:  Cigarettes     Quit date:  10/22/1977   .  Smokeless tobacco:  Former Systems developer     Quit date:  10/22/1984   .  Alcohol Use:  4.2 oz/week     7 Cans of beer per week   Family History  Family History   Problem  Relation  Age of Onset   .  Heart disease  Mother    .  COPD  Mother    Allergies  Allergies   Allergen  Reactions   .  Sinequan [Doxepin Hcl]  Other (See Comments)     hallucinations   .  Procaine Hcl  Other (See  Comments)     Tingling all over as soon as it was injected    Current Outpatient Prescriptions   Medication  Sig  Dispense  Refill   .  aspirin EC 81 MG tablet  Take 81 mg by mouth daily.     .  Calcium Carbonate-Vitamin D (CALCIUM 600 + D PO)  Take by mouth 2 (two) times daily.     .  cyclobenzaprine (FLEXERIL) 10 MG tablet  Take 10 mg by mouth daily.     .  finasteride (PROSCAR) 5 MG tablet  Take 5 mg by mouth daily.     .  Fluticasone Propionate, Inhal, 100 MCG/BLIST AEPB  Inhale into the lungs daily.     Marland Kitchen  gabapentin (NEURONTIN) 100 MG capsule  Take 1 capsule by mouth 2 (two) times daily.     .  irbesartan (AVAPRO) 150 MG tablet  Take 150 mg by mouth at bedtime.     Marland Kitchen  meclizine (ANTIVERT) 25 MG tablet  Take 25 mg by mouth as needed for dizziness.     .  meloxicam (MOBIC) 7.5 MG tablet  Take 1 tablet by mouth daily.     .  metoprolol succinate (TOPROL-XL) 50 MG 24 hr tablet  Take 1 tablet (50 mg total) by mouth daily.  90 tablet  0   .  Multiple Vitamin (MULTIVITAMIN PO)  Take by mouth. ALIVE men's vitamin     .  omeprazole (PRILOSEC) 20 MG capsule  Take 20 mg by mouth daily.     Marland Kitchen  triamterene-hydrochlorothiazide (DYAZIDE) 37.5-25 MG per capsule  Take 1 capsule by mouth every morning.     .  triamterene-hydrochlorothiazide (MAXZIDE-25) 37.5-25 MG per tablet  take 1 tablet by mouth once daily  90 tablet  1    No current facility-administered medications for this visit.   ROS:  General: No weight loss, Fever, chills  HEENT: No recent headaches, no nasal bleeding, no visual changes, no sore throat  Neurologic: + dizziness, no blackouts, seizures. No recent symptoms of stroke or mini- stroke. No recent episodes of slurred speech, or temporary blindness.  Cardiac: No recent episodes of chest pain/pressure, no shortness of breath at rest. No shortness of breath with exertion. Denies history of atrial fibrillation or irregular heartbeat  Vascular: No history of rest pain in feet. No history  of claudication. No history of non-healing ulcer, No history of DVT  Pulmonary: No home oxygen, no productive cough, no hemoptysis, No asthma or wheezing  Musculoskeletal: [ ]  Arthritis, [ ]  Low back pain, [ ]  Joint pain  Hematologic:No history of hypercoagulable state. No history of easy bleeding. No history of anemia  Gastrointestinal: No hematochezia or melena, No gastroesophageal reflux, no trouble swallowing  Urinary: [ ]  chronic Kidney disease, [ ]  on HD - [ ]  MWF or [ ]  TTHS, [ ]  Burning with urination, [ ]  Frequent urination, [ ]  Difficulty urinating;  Skin: No rashes  Psychological: No history of anxiety, No history of depression  Physical Examination  Filed Vitals:   10/27/13 0746  BP: 149/59  Pulse: 64  Temp: 98.8 F (37.1 C)  TempSrc: Oral  Resp: 16  SpO2: 97%    Body mass index is 28.15 kg/(m^2).  General: Alert and oriented, no acute distress  HEENT: Normal  Neck: Right-sided carotid bruit no left bruit  Pulmonary: Clear to auscultation bilaterally  Cardiac: Regular Rate and Rhythm without murmur  Gastrointestinal: Soft, non-tender, non-distended, no mass  Skin: No rash  Extremity Pulses: 2+ radial, brachial, femoral pulses bilaterally  Musculoskeletal: No deformity or edema  Neurologic: Upper and lower extremity motor 5/5 and symmetric  DATA: Carotid duplex exam today shows progression of his right carotid stenosis a greater than 80%. Left side has less than 40% stenosis. Ultrasound of his abdominal aortic aneurysm shows no endoleak. Aneurysm has shrunk from 5.4 cm to 3.7 cm. The left common iliac aneurysm has shrunk from 3.3 cm to 1.1 cm. The right common iliac aneurysm has also completely shrunk to normal caliber. I reviewed and interpreted this study.  ASSESSMENT: Asymptomatic high-grade right internal carotid artery stenosis  PLAN: Right carotid endarterectomy  Patient will continue his aspirin 81 mg once daily.  Ruta Hinds, MD  [facility] [place]  Office: [phone]  Pager: [phone]

## 2013-10-27 NOTE — Anesthesia Preprocedure Evaluation (Addendum)
Anesthesia Evaluation  Patient identified by MRN, date of birth, ID band Patient awake    Reviewed: Allergy & Precautions, H&P , NPO status , Patient's Chart, lab work & pertinent test results, reviewed documented beta blocker date and time   History of Anesthesia Complications Negative for: history of anesthetic complications  Airway Mallampati: I TM Distance: >3 FB Neck ROM: Full    Dental  (+) Partial Lower and Partial Upper   Pulmonary neg COPDformer smoker,    Pulmonary exam normal       Cardiovascular hypertension, Pt. on home beta blockers and Pt. on medications - angina+ CAD, + Past MI and + Peripheral Vascular Disease Rhythm:Regular Rate:Normal - Systolic murmurs Patient with EF 47% on myocardial perfusion scan without evidence of ischemia or st changes.Marland Kitchen Apical dyskinesia and severe inferior hypokinesia. CAth with LAD occlusion, RCA, PDA and circ diseased. Denies angina. No routine exercise. Took Metoprolol PO at 0630 10/27/13. Reports intermittent irregular beats. History of AAA s/p EVAR.   Neuro/Psych Patient with asymptomatic 80% right carotid stenosis. No baseline neurologic defects. Claims to have intermittent numbness in hands due to a "pinched nerve" in his neck but symptom is absent today and not caused by neck extension. Gabapentin for neuralgia in feet/legs bilaterally     GI/Hepatic Neg liver ROS, GERD-  Medicated and Controlled,  Endo/Other  neg diabetes  Renal/GU negative Renal ROS  negative genitourinary   Musculoskeletal   Abdominal   Peds  Hematology negative hematology ROS (+)   Anesthesia Other Findings   Reproductive/Obstetrics                        Anesthesia Physical Anesthesia Plan  ASA: III  Anesthesia Plan: General   Post-op Pain Management:    Induction: Intravenous  Airway Management Planned: Oral ETT  Additional Equipment: Arterial line  Intra-op Plan:    Post-operative Plan: Extubation in OR  Informed Consent: I have reviewed the patients History and Physical, chart, labs and discussed the procedure including the risks, benefits and alternatives for the proposed anesthesia with the patient or authorized representative who has indicated his/her understanding and acceptance.   Dental advisory given  Plan Discussed with: CRNA and Surgeon  Anesthesia Plan Comments:         Anesthesia Quick Evaluation

## 2013-10-27 NOTE — Progress Notes (Signed)
Awake and alert in PACU follows commands Neuro 5/5 motor   Filed Vitals:   10/27/13 1330 10/27/13 1336 10/27/13 1337 10/27/13 1345  BP: 89/39   78/47  Pulse: 53 50 51 52  Temp:      TempSrc:      Resp: 18 14 20 12   SpO2: 97% 97% 97% 99%    Stable home tomorrow  Ruta Hinds, MD [facility] [place] Office: [phone] Pager: [phone]

## 2013-10-28 ENCOUNTER — Encounter (HOSPITAL_COMMUNITY): Payer: Self-pay

## 2013-10-28 LAB — BASIC METABOLIC PANEL
BUN: 20 mg/dL (ref 6–23)
CHLORIDE: 103 meq/L (ref 96–112)
CO2: 23 meq/L (ref 19–32)
Calcium: 7.7 mg/dL — ABNORMAL LOW (ref 8.4–10.5)
Creatinine, Ser: 0.95 mg/dL (ref 0.50–1.35)
GFR calc Af Amer: 86 mL/min — ABNORMAL LOW (ref >90)
GFR calc non Af Amer: 74 mL/min — ABNORMAL LOW (ref >90)
Glucose, Bld: 113 mg/dL — ABNORMAL HIGH (ref 70–99)
POTASSIUM: 4.1 meq/L (ref 3.7–5.3)
Sodium: 136 mEq/L — ABNORMAL LOW (ref 137–147)

## 2013-10-28 LAB — CBC
HEMATOCRIT: 29.6 % — AB (ref 39.0–52.0)
Hemoglobin: 10 g/dL — ABNORMAL LOW (ref 13.0–17.0)
MCH: 32.1 pg (ref 26.0–34.0)
MCHC: 33.8 g/dL (ref 30.0–36.0)
MCV: 94.9 fL (ref 78.0–100.0)
Platelets: 124 10*3/uL — ABNORMAL LOW (ref 150–400)
RBC: 3.12 MIL/uL — AB (ref 4.22–5.81)
RDW: 13.6 % (ref 11.5–15.5)
WBC: 8.4 10*3/uL (ref 4.0–10.5)

## 2013-10-28 MED ORDER — DICLOFENAC SODIUM 50 MG PO TBEC
50.0000 mg | DELAYED_RELEASE_TABLET | Freq: Two times a day (BID) | ORAL | Status: DC
  Administered 2013-10-28 – 2013-10-30 (×5): 50 mg via ORAL
  Filled 2013-10-28 (×6): qty 1

## 2013-10-28 NOTE — Anesthesia Postprocedure Evaluation (Signed)
  Anesthesia Post-op Note  Patient: Henry Neal  Procedure(s) Performed: Procedure(s): ENDARTERECTOMY CAROTID (Right)  Patient Location: PACU and Nursing Unit  Anesthesia Type:General  Level of Consciousness: awake and alert   Airway and Oxygen Therapy: Patient Spontanous Breathing  Post-op Pain: mild  Post-op Assessment: Post-op Vital signs reviewed, Patient's Cardiovascular Status Stable, Respiratory Function Stable, Patent Airway and No signs of Nausea or vomiting  Post-op Vital Signs: Reviewed and stable  Complications: No apparent anesthesia complications

## 2013-10-28 NOTE — Progress Notes (Signed)
Cuff BP: 74/53, a-line BP: 119/35. HR 45. Neuro intact. Fingers and toes cool bilaterally. No complaint of dizziness. Had not voided since surgery, bladder scanned. Retaining roughly 250cc. Dr. Trula Slade notified. New orders received. Will continue to monitor.   Lum Babe, RN

## 2013-10-28 NOTE — Progress Notes (Signed)
Physician notified: Leontine Locket, NP At: 1642  Regarding: OK to DC arterial line? Last BP per cuff 112/46, aline reading 120s sys. No orders for transfer placed.  Awaiting return response.   Returned Response at: 1644  Order(s): Will place transfer orders. DC arterial line.

## 2013-10-28 NOTE — Progress Notes (Addendum)
[facility] Progress Note  10/28/2013 8:14 AM 1 Day Post-Op  Subjective:  "My back hurts"  afebrile 28'U-13'K systolic HR  44'W-10'U 72% RA  Filed Vitals:   10/28/13 0730  BP:   Pulse:   Temp: 98.8 F (37.1 C)  Resp:      Physical Exam: Neuro:  In tact; denies trouble swallowing Incision:  C/d/i Lungs:  Non-labored  CBC    Component Value Date/Time   WBC 8.4 10/28/2013 0500   RBC 3.12* 10/28/2013 0500   HGB 10.0* 10/28/2013 0500   HCT 29.6* 10/28/2013 0500   PLT 124* 10/28/2013 0500   MCV 94.9 10/28/2013 0500   MCH 32.1 10/28/2013 0500   MCHC 33.8 10/28/2013 0500   RDW 13.6 10/28/2013 0500   LYMPHSABS 2.0 04/15/2009 1400   MONOABS 0.8 04/15/2009 1400   EOSABS 0.0 04/15/2009 1400   BASOSABS 0.0 04/15/2009 1400    BMET    Component Value Date/Time   NA 136* 10/28/2013 0500   K 4.1 10/28/2013 0500   CL 103 10/28/2013 0500   CO2 23 10/28/2013 0500   GLUCOSE 113* 10/28/2013 0500   BUN 20 10/28/2013 0500   CREATININE 0.95 10/28/2013 0500   CREATININE 1.11 10/09/2013 1307   CALCIUM 7.7* 10/28/2013 0500   GFRNONAA 74* 10/28/2013 0500   GFRAA 86* 10/28/2013 0500     Intake/Output Summary (Last 24 hours) at 10/28/13 0814 Last data filed at 10/28/13 5366  Gross per 24 hour  Intake   4030 ml  Output    850 ml  Net   3180 ml      Assessment/Plan:  This is a 80 y.o. male who is s/p right CEA 1 Day Post-Op   -pt neuro exam is in tact -pt has not ambulated -pt has not voided-foley catheter removed at 1am this morning.  Pt will receive his Proscar this am.  May require I&O cath -soft BP overnight requiring fluid bolus.  440 systolic this am and HR in 50's.  Will hold antihypertensives this am -will continue to monitor this morning-may discharge later today.   Leontine Locket, PA-C [facility] [phone]   Neuro intact swallow motor sensation No neck hematoma BP improved this a.m. Lots of muscle aches/soreness back neck shoulders arms Will restart his  NSAID Transfer to 2 W Hopefully home in am if BP reasonable.  Ruta Hinds, MD [facility] [place] Office: [phone] Pager: [phone]

## 2013-10-28 NOTE — Progress Notes (Signed)
Utilization review completed.  

## 2013-10-29 MED ORDER — SORBITOL 70 % PO SOLN
15.0000 mL | Freq: Every day | ORAL | Status: DC | PRN
  Filled 2013-10-29: qty 15

## 2013-10-29 MED ORDER — BISACODYL 10 MG RE SUPP: 10.0000 mg | Freq: Every day | RECTAL | Status: DC | PRN

## 2013-10-29 MED ORDER — ENOXAPARIN SODIUM 40 MG/0.4ML ~~LOC~~ SOLN
40.0000 mg | SUBCUTANEOUS | Status: DC
  Administered 2013-10-30: 40 mg via SUBCUTANEOUS
  Filled 2013-10-29: qty 0.4

## 2013-10-29 MED ORDER — SORBITOL 70 % SOLN
15.0000 mL | Freq: Every day | Status: DC | PRN
  Filled 2013-10-29: qty 30

## 2013-10-29 NOTE — Progress Notes (Addendum)
[facility] Progress Note  10/29/2013 7:20 AM 2 Days Post-Op  Subjective:  Only complaint is his left shoulder again this am.  Afebrile Systolic now 308'M-578'I HR 50's-60's 98% RA  Filed Vitals:   10/29/13 0326  BP: 128/57  Pulse: 59  Temp: 98.1 F (36.7 C)  Resp: 14     Physical Exam: Neuro:  In tact Incision:  C/d/i  CBC    Component Value Date/Time   WBC 8.4 10/28/2013 0500   RBC 3.12* 10/28/2013 0500   HGB 10.0* 10/28/2013 0500   HCT 29.6* 10/28/2013 0500   PLT 124* 10/28/2013 0500   MCV 94.9 10/28/2013 0500   MCH 32.1 10/28/2013 0500   MCHC 33.8 10/28/2013 0500   RDW 13.6 10/28/2013 0500   LYMPHSABS 2.0 04/15/2009 1400   MONOABS 0.8 04/15/2009 1400   EOSABS 0.0 04/15/2009 1400   BASOSABS 0.0 04/15/2009 1400    BMET    Component Value Date/Time   NA 136* 10/28/2013 0500   K 4.1 10/28/2013 0500   CL 103 10/28/2013 0500   CO2 23 10/28/2013 0500   GLUCOSE 113* 10/28/2013 0500   BUN 20 10/28/2013 0500   CREATININE 0.95 10/28/2013 0500   CREATININE 1.11 10/09/2013 1307   CALCIUM 7.7* 10/28/2013 0500   GFRNONAA 74* 10/28/2013 0500   GFRAA 86* 10/28/2013 0500     Intake/Output Summary (Last 24 hours) at 10/29/13 0720 Last data filed at 10/29/13 0700  Gross per 24 hour  Intake   2640 ml  Output   3175 ml  Net   -535 ml      Assessment/Plan:  This is a 80 y.o. male who is s/p right CEA 2 Days Post-Op  -pt is doing well this am. -pt neuro exam is in tact -pt has ambulated -pt has voided  -Pt doing much better this morning.  He has ambulated and voided well.  Will discharge this morning.  -pt BP and HR rebounded today.  His HR is back to baseline when he arrived for admission.   -Starting patient on Lipitor -Follow up with Dr. Oneida Alar in 2 weeks.   Leontine Locket, PA-C [facility] [phone]   Still with right shoulder soreness.  No chest pain no dyspnea no nausea voiding well feels constipated Right neck healing Neuro intact Will d/c home  later today Rx constipation  Ruta Hinds, MD [facility] [place]: [phone] Pager: [phone]

## 2013-10-29 NOTE — Discharge Summary (Signed)
[facility] Discharge Summary  Henry Neal 21-Apr-1927 80 y.o. male  [id number]  Admission Date: 10/27/2013  Discharge Date: 10/28/13  Physician: Elam Dutch, MD  Admission Diagnosis: RIGHT ICA STENOSIS   HPI:   This is a 80 y.o. male who presents for evaluation of carotid stenosis. We have been following this for several years. It remains asymptomatic.The patient denies symptoms of TIA, amaurosis, or stroke. The patient is currently on 81 mg aspirin antiplatelet therapy. The carotid stenosis was found screening duplex for right-sided carotid bruit. Other medical problems include hypertension, right shoulder arthritis. These are currently stable. He has cardiology for followup scheduled with Dr. Orene Desanctis next Monday. We are also following him for previous stent graft repair of bilateral common iliac and infrarenal abdominal aortic aneurysms. His stent graft was placed in 2010. He reports no abdominal or back pain.   Hospital Course:  The patient was admitted to the hospital and taken to the operating room on 10/27/2013 and underwent right carotid endarterectomy.  The pt tolerated the procedure well and was transported to the PACU in good condition.   By POD 1, the pt neuro status was in tact.  He did have a soft blood pressure and did require fluid boluses and his BP medication was held.  This was able to be restarted in 1-2 days and he was then discharged.  He also was having muscle aches and soreness in his back, neck and shoulders.  The remainder of the hospital course consisted of increasing mobilization and increasing intake of solids without difficulty.    Recent Labs  10/28/13 0500  NA 136*  K 4.1  CL 103  CO2 23  GLUCOSE 113*  BUN 20  CALCIUM 7.7*    Recent Labs  10/27/13 2000 10/28/13 0500  WBC 9.2 8.4  HGB 11.1* 10.0*  HCT 32.5* 29.6*  PLT 147* 124*   No results found for this basename: INR,  in the last 72 hours  Discharge  Instructions:   The patient is discharged to home with extensive instructions on wound care and progressive ambulation.  They are instructed not to drive or perform any heavy lifting until returning to see the physician in his office.  Discharge Orders   Future Appointments Provider Department Dept Phone   12/29/2013 10:15 AM [place], [place] at [place]   Future Orders Complete By Expires   Call MD for:  redness, tenderness, or signs of infection (pain, swelling, bleeding, redness, odor or green/yellow discharge around incision site)  As directed    Call MD for:  severe or increased pain, loss or decreased feeling  in affected limb(s)  As directed    Call MD for:  temperature >100.5  As directed    CAROTID Sug[place]: Call MD for difficulty swallowing or speaking; weakness in arms or legs that is a new symtom; severe headache.  If you have increased swelling in the neck and/or  are having difficulty breathing, CALL 911  As directed    Discharge wound care:  As directed    Comments:     Shower daily with soap and water starting 10/29/13   Driving Restrictions  As directed    Comments:     No driving for 2 weeks   Lifting restrictions  As directed    Comments:     No lifting for 2 weeks   Resume previous diet  As directed       Discharge Diagnosis:  RIGHT ICA STENOSIS  Secondary Diagnosis: Patient Active Problem List   Diagnosis Date Noted  . Carotid artery stenosis 10/27/2013  . Carotid artery disease 10/09/2013  . Essential hypertension 10/09/2013  . Abnormal stress test 10/09/2013  . Aftercare following surgery of the circulatory system, [place] 09/24/2013  . AAA (abdominal aortic aneurysm) without rupture 09/24/2013  . Occlusion and stenosis of carotid artery without mention of cerebral infarction 10/02/2012  . Abdominal aneurysm without mention of rupture 10/02/2012  . PAD (peripheral artery disease) 10/02/2012   Past Medical History  Diagnosis Date   . Palpitation   . AAA (abdominal aortic aneurysm)   . Arthritis   . Hiatal hernia   . Dizziness     takes Antivert daily as needed  . ED (erectile dysfunction)   . Sigmoid diverticulosis   . Thyroid disease   . T12 compression fracture   . Allergic rhinitis     uses Flonase daily  . Chronic venous insufficiency   . Cancer     skin  . Carotid artery occlusion   . Abnormal stress test   . Hypertension     takes Proscar,Dyazide,Avapro,and Metoprolol daily  . GERD (gastroesophageal reflux disease)     takes Omeprazole daily  . Coronary artery disease   . Anginal pain     pain in neck,shoulders,down arms occ  . Shortness of breath       Medication List         aspirin EC 81 MG tablet  Take 81 mg by mouth daily.     atorvastatin 10 MG tablet  Commonly known as:  LIPITOR  Take 1 tablet (10 mg total) by mouth daily.     CALCIUM 600 + D PO  Take 1 tablet by mouth 2 (two) times daily.     cyclobenzaprine 10 MG tablet  Commonly known as:  FLEXERIL  Take 10 mg by mouth 3 (three) times daily as needed for muscle spasms.     diclofenac 50 MG EC tablet  Commonly known as:  VOLTAREN  Take 50 mg by mouth 2 (two) times daily.     finasteride 5 MG tablet  Commonly known as:  PROSCAR  Take 5 mg by mouth daily.     fluticasone 50 MCG/ACT nasal spray  Commonly known as:  FLONASE  Place 1 spray into both nostrils daily as needed for allergies.     gabapentin 100 MG capsule  Commonly known as:  NEURONTIN  Take 1 capsule by mouth 2 (two) times daily.     irbesartan 150 MG tablet  Commonly known as:  AVAPRO  Take 150 mg by mouth every morning.     meclizine 25 MG tablet  Commonly known as:  ANTIVERT  Take 25 mg by mouth 2 (two) times daily as needed for dizziness.     metoprolol succinate 50 MG 24 hr tablet  Commonly known as:  TOPROL-XL  Take 1 tablet (50 mg total) by mouth daily.     mometasone 0.1 % cream  Commonly known as:  ELOCON  Apply 1 application topically  daily as needed (for irritation).     MULTIVITAMIN PO  Take by mouth. ALIVE men's vitamin     omeprazole 20 MG capsule  Commonly known as:  PRILOSEC  Take 20 mg by mouth daily.     oxyCODONE 5 MG immediate release tablet  Commonly known as:  ROXICODONE  Take 1 tablet (5 mg total) by mouth every 6 (six) hours as needed  for severe pain.     triamterene-hydrochlorothiazide 37.5-25 MG per capsule  Commonly known as:  DYAZIDE  Take 1 capsule by mouth every morning.        Roxicodone #30 No Refill  Disposition: home  Patient's condition: is Good  Follow up: 1. Dr.  Oneida Alar in 2 weeks.   Leontine Locket, PA-C [facility] [phone]  --- For [facility] use --- Instructions: Press F2 to tab through selections.  Delete question if not applicable.   Modified Rankin score at D/C (0-6): 0  IV medication needed for:  1. Hypertension: No 2. Hypotension: Yes-very brief with immediate response and was then discontinued  Post-op Complications: No  1. Post-op CVA or TIA: No  If yes: Event classification (right eye, left eye, right cortical, left cortical, verterobasilar, other): n/a  If yes: Timing of event (intra-op, <6 hrs post-op, >=6 hrs post-op, unknown): n/a  2. CN injury: No  If yes: CN n/a injuried   3. Myocardial infarction: No  If yes: Dx by (EKG or clinical, Troponin): n/a   4.  CHF: No  5.  Dysrhythmia (new): No  6. Wound infection: No  7. Reperfusion symptoms: No  8. Return to OR: No  If yes: return to OR for (bleeding, neurologic, other CEA incision, other): n/a  Discharge medications: Statin use:  Yes If No: [ ]  For Medical reasons, [ ]  Non-compliant, [ ]  Not-indicated ASA use:  Yes  If No: [ ]  For Medical reasons, [ ]  Non-compliant, [ ]  Not-indicated Beta blocker use:  Yes If No: [ ]  For Medical reasons, [ ]  Non-compliant, [ ]  Not-indicated ACE-Inhibitor use:  No If No: [ ]  For Medical reasons, [ ]  Non-compliant, [ ]   Not-indicated ARB:  yes P2Y12 Antagonist use: no, [ ]  Plavix, [ ]  Plasugrel, [ ]  Ticlopinine, [ ]  Ticagrelor, [ ]  Other, [ ]  No for medical reason, [ ]  Non-compliant, [ ]  Not-indicated Anti-coagulant use:  no, [ ]  Warfarin, [ ]  Rivaroxaban, [ ]  Dabigatran, [ ]  Other, [ ]  No for medical reason, [ ]  Non-compliant, [ ]  Not-indicated

## 2013-10-30 ENCOUNTER — Encounter (HOSPITAL_COMMUNITY): Payer: Self-pay | Admitting: Physician Assistant

## 2013-10-30 ENCOUNTER — Telehealth: Payer: Self-pay | Admitting: Vascular Surgery

## 2013-10-30 DIAGNOSIS — I059 Rheumatic mitral valve disease, unspecified: Secondary | ICD-10-CM

## 2013-10-30 DIAGNOSIS — I447 Left bundle-branch block, unspecified: Secondary | ICD-10-CM

## 2013-10-30 DIAGNOSIS — R079 Chest pain, unspecified: Secondary | ICD-10-CM

## 2013-10-30 DIAGNOSIS — I2510 Atherosclerotic heart disease of native coronary artery without angina pectoris: Secondary | ICD-10-CM

## 2013-10-30 LAB — TROPONIN I: Troponin I: <0.3 ng/mL (ref <0.30)

## 2013-10-30 MED ORDER — CARVEDILOL 6.25 MG PO TABS: 6.2500 mg | ORAL_TABLET | Freq: Two times a day (BID) | ORAL | Status: DC

## 2013-10-30 MED ORDER — ISOSORBIDE MONONITRATE ER 30 MG PO TB24
30.0000 mg | ORAL_TABLET | Freq: Every day | ORAL | Status: DC
  Administered 2013-10-30: 30 mg via ORAL
  Filled 2013-10-30: qty 1

## 2013-10-30 MED ORDER — ATORVASTATIN CALCIUM 40 MG PO TABS
40.0000 mg | ORAL_TABLET | Freq: Every day | ORAL | Status: DC
  Administered 2013-10-30: 40 mg via ORAL
  Filled 2013-10-30: qty 1

## 2013-10-30 MED ORDER — CARVEDILOL 6.25 MG PO TABS
6.2500 mg | ORAL_TABLET | Freq: Two times a day (BID) | ORAL | Status: DC
  Administered 2013-10-30: 6.25 mg via ORAL
  Filled 2013-10-30 (×2): qty 1

## 2013-10-30 MED ORDER — TRIAMTERENE-HCTZ 37.5-25 MG PO TABS
1.0000 | ORAL_TABLET | Freq: Every day | ORAL | Status: DC
  Administered 2013-10-30: 1 via ORAL
  Filled 2013-10-30: qty 1

## 2013-10-30 MED ORDER — ISOSORBIDE MONONITRATE ER 30 MG PO TB24: 30.0000 mg | ORAL_TABLET | Freq: Every day | ORAL | Status: DC

## 2013-10-30 MED ORDER — ATORVASTATIN CALCIUM 40 MG PO TABS: 40.0000 mg | ORAL_TABLET | Freq: Every day | ORAL | Status: DC

## 2013-10-30 NOTE — Progress Notes (Signed)
NURSING PROGRESS NOTE  Henry Neal [id number] Discharge Data: 10/30/2013 7:25 PM Attending Provider: No att. providers found [id number], [place] to be D/C'd Home per MD order.  Discussed with the patient the After Visit Summary and all questions fully answered. All IV's discontinued with no bleeding noted. All belongings returned to patient for patient to take home.   Last Vital Signs:  Blood pressure 127/50, pulse 61, temperature 97.5 F (36.4 C), temperature source Oral, resp. rate 20, height 5' 10.47" (1.79 m), weight 91.7 kg (202 lb 2.6 oz), SpO2 96.00%.  Discharge Medication List   Medication List    STOP taking these medications       metoprolol succinate 50 MG 24 hr tablet  Commonly known as:  TOPROL-XL      TAKE these medications       aspirin EC 81 MG tablet  Take 81 mg by mouth daily.     atorvastatin 40 MG tablet  Commonly known as:  LIPITOR  Take 1 tablet (40 mg total) by mouth daily at 6 PM.     CALCIUM 600 + D PO  Take 1 tablet by mouth 2 (two) times daily.     carvedilol 6.25 MG tablet  Commonly known as:  COREG  Take 1 tablet (6.25 mg total) by mouth 2 (two) times daily with a meal.     cyclobenzaprine 10 MG tablet  Commonly known as:  FLEXERIL  Take 10 mg by mouth 3 (three) times daily as needed for muscle spasms.     diclofenac 50 MG EC tablet  Commonly known as:  VOLTAREN  Take 50 mg by mouth 2 (two) times daily.     finasteride 5 MG tablet  Commonly known as:  PROSCAR  Take 5 mg by mouth daily.     fluticasone 50 MCG/ACT nasal spray  Commonly known as:  FLONASE  Place 1 spray into both nostrils daily as needed for allergies.     gabapentin 100 MG capsule  Commonly known as:  NEURONTIN  Take 1 capsule by mouth 2 (two) times daily.     irbesartan 150 MG tablet  Commonly known as:  AVAPRO  Take 150 mg by mouth every morning.     isosorbide mononitrate 30 MG 24 hr tablet  Commonly known as:  IMDUR  Take 1 tablet  (30 mg total) by mouth daily.     meclizine 25 MG tablet  Commonly known as:  ANTIVERT  Take 25 mg by mouth 2 (two) times daily as needed for dizziness.     mometasone 0.1 % cream  Commonly known as:  ELOCON  Apply 1 application topically daily as needed (for irritation).     MULTIVITAMIN PO  Take by mouth. ALIVE men's vitamin     omeprazole 20 MG capsule  Commonly known as:  PRILOSEC  Take 20 mg by mouth daily.     oxyCODONE 5 MG immediate release tablet  Commonly known as:  ROXICODONE  Take 1 tablet (5 mg total) by mouth every 6 (six) hours as needed for severe pain.     triamterene-hydrochlorothiazide 37.5-25 MG per capsule  Commonly known as:  DYAZIDE  Take 1 capsule by mouth every morning.

## 2013-10-30 NOTE — Telephone Encounter (Addendum)
Message copied by Gena Fray on Fri Oct 30, 2013  2:49 PM ------      Message from: Gabriel Earing      Created: Fri Oct 30, 2013  8:19 AM       S/p right CEA 10/27/13. F/u with Dr. Oneida Alar in 2 weeks.            Thanks,      Samantha ------  10/30/13: spoke with pt to schedule, dpm

## 2013-10-30 NOTE — Progress Notes (Signed)
Ambulated 150 ft in hallway with patient w/out difficulty ambulating. Returned to room d/t patient c/o mid-shoulder  And jaw pain 7/10. Returned to bed EKG obtained NSR noted with BBB. Dr. Oneida Alar informed. Call bell near. Will monitor.Cindee Salt

## 2013-10-30 NOTE — Progress Notes (Addendum)
[facility] Progress Note  10/30/2013 8:03 AM 3 Days Post-Op  Subjective:  C/o right shoulder pain; wants to go home  Afebrile 269'S-854'O systolic HR 27'O-35'K regular  96% RA  Filed Vitals:   10/30/13 0516  BP: 126/71  Pulse: 55  Temp: 98 F (36.7 C)  Resp: 18     Physical Exam: Neuro:  In tact Incision:  C/d/i  CBC    Component Value Date/Time   WBC 8.4 10/28/2013 0500   RBC 3.12* 10/28/2013 0500   HGB 10.0* 10/28/2013 0500   HCT 29.6* 10/28/2013 0500   PLT 124* 10/28/2013 0500   MCV 94.9 10/28/2013 0500   MCH 32.1 10/28/2013 0500   MCHC 33.8 10/28/2013 0500   RDW 13.6 10/28/2013 0500   LYMPHSABS 2.0 04/15/2009 1400   MONOABS 0.8 04/15/2009 1400   EOSABS 0.0 04/15/2009 1400   BASOSABS 0.0 04/15/2009 1400    BMET    Component Value Date/Time   NA 136* 10/28/2013 0500   K 4.1 10/28/2013 0500   CL 103 10/28/2013 0500   CO2 23 10/28/2013 0500   GLUCOSE 113* 10/28/2013 0500   BUN 20 10/28/2013 0500   CREATININE 0.95 10/28/2013 0500   CREATININE 1.11 10/09/2013 1307   CALCIUM 7.7* 10/28/2013 0500   GFRNONAA 74* 10/28/2013 0500   GFRAA 86* 10/28/2013 0500     Intake/Output Summary (Last 24 hours) at 10/30/13 0803 Last data filed at 10/30/13 0620  Gross per 24 hour  Intake      0 ml  Output   1050 ml  Net  -1050 ml      Assessment/Plan:  This is a 80 y.o. male who is s/p right CEA 3 Days Post-Op  -pt is doing well this am. -pt neuro exam is in tact -pt has ambulated -pt has voided -will have RN walk with pt this am -discharge later this morning   Leontine Locket, PA-C [facility] [phone]   Pt with right shoulder pain with walking this morning. Now also had some right jaw pain. 12 lead EKG shows new Left bundle branch block.  Will check serial cardiac enzymes to rule out MI.  Will also have Cardiology review.  Will delay discharge for now.  He is currently chest pain free no nausea no dyspnea.  He did have some bradycardia and hypotension in  the PACU post op day 0.  Ruta Hinds, MD [facility] [place] Office: [phone] Pager: [phone]

## 2013-10-30 NOTE — Progress Notes (Signed)
  Echocardiogram 2D Echocardiogram has been performed.  Henry Neal FRANCES 10/30/2013, 5:47 PM

## 2013-10-30 NOTE — Consult Note (Signed)
Referring Physician:  Primary Physician: Merrilee Seashore, MD Primary Cardiologist:  Dr. Gwenlyn Found Reason for Consultation: Chest pain  HPI: 80 yo male with Hx CVD, HTN, AAA w/ stent in 2010, and GERD, had been having intermitted back/shoulder/arm pain for months. Initially placed on diclofenac by primary MD, helped some.  December 2014: Needed CEA, saw Dr. Gwenlyn Found pre-op and had MV, which was abnormal  Cardiac cath 12/22 (see cath report below) and felt slightly increased risk, but OK for surgery. Medical therapy recommended.  Tolerated surgery well but has complained of pain between shoulder blades, radiating to shoulders and down both arms at times, notably with exertion. The pain also goes up into his jaw/cheek. It has reached an 8/10. More often with exertion, but also has at rest. It is not associated with SOB, N&V or diaphoresis. He has other neck/back/shoulder pain that is associated with certain movements. He also has 2 different palpitations, one that is a single hard beat and occasional brief light fluttering.  No presyncope or other symptoms with these.   He is willing to do whatever we need him to do for evaluation.  Review of Systems:    Cardiac Review of Systems: {Y] = yes [ ]  = no  Chest Pain [   y ]  Resting SOB [   ] Mild Exertional SOB  [ y ]  Orthopnea [  ]  Pedal Edema [   ]    Palpitations [ y ] Syncope  [  ]   Presyncope [   ]  General Review of Systems: [Y] = yes [  ]=no Constitional: recent weight change [  ]; anorexia [  ]; fatigue [  ]; nausea [  ]; night sweats [  ]; fever [  ]; or chills [  ];                                                                                                                                          Dental: poor dentition[  ];    Eye : blurred vision [  ]; diplopia [   ]; vision changes [  ];  Amaurosis fugax[  ]; Resp: cough [  ];  wheezing[  ];  hemoptysis[  ]; shortness of breath[  ]; paroxysmal nocturnal dyspnea[  ]; dyspnea  on exertion[  ]; or orthopnea[  ];  GI:  gallstones[  ], vomiting[  ];  dysphagia[  ]; melena[  ];  hematochezia [  ]; heartburn[  ];   Hx of  Colonoscopy[  ]; GU: kidney stones [  ]; hematuria[  ];   dysuria [  ];  nocturia[  ];  history of     obstruction [  ];                 Skin: rash, swelling[  ];, hair loss[  ];  peripheral  edema[  ];  or itching[  ]; Musculosketetal: myalgias[  ];  joint swelling[  ];  joint erythema[  ];  joint pain[  ];  back pain[  ];  Heme/Lymph: bruising[  ];  bleeding[  ];  anemia[  ];  Neuro: TIA[  ];  headaches[  ];  stroke[  ];  vertigo[  ];  seizures[  ];   paresthesias[  ];  difficulty walking[  ];  Psych:depression[  ]; anxiety[  ];  Endocrine: diabetes[  ];  thyroid dysfunction[  ];  Immunizations: Flu [  ]; Pneumococcal[  ];  Other:  Past Medical History  Diagnosis Date  . Palpitation   . AAA (abdominal aortic aneurysm)   . Arthritis   . Hiatal hernia   . Dizziness     takes Antivert daily as needed  . ED (erectile dysfunction)   . Sigmoid diverticulosis   . Thyroid disease   . T12 compression fracture   . Allergic rhinitis     uses Flonase daily  . Chronic venous insufficiency   . Cancer     skin  . Carotid artery occlusion   . Abnormal stress test   . Hypertension     takes Proscar,Dyazide,Avapro,and Metoprolol daily  . GERD (gastroesophageal reflux disease)     takes Omeprazole daily  . Coronary artery disease   . Anginal pain     pain in neck,shoulders,down arms occ  . Shortness of breath    Past Surgical History  Procedure Laterality Date  . Rotator cuff repair  2003    left arm  . Abdominal aortic aneurysm repair  02/24/2009    AAA Stenting  . Hernia repair Bilateral   . Hand surgery Right     tendon, lft 90's  . Endarterectomy Right 10/27/2013    Procedure: ENDARTERECTOMY CAROTID;  Surgeon: Elam Dutch, MD;  Location: [facility] OR;  Service: Vascular;  Laterality: Right;     Current Medications: . aspirin EC  81 mg  Oral Daily  . atorvastatin  10 mg Oral q1800  . diclofenac  50 mg Oral BID  . docusate sodium  100 mg Oral Daily  . enoxaparin (LOVENOX) injection  40 mg Subcutaneous Q24H  . finasteride  5 mg Oral Daily  . gabapentin  100 mg Oral BID  . irbesartan  150 mg Oral q morning - 10a  . metoprolol succinate  50 mg Oral Daily  . pantoprazole  40 mg Oral Daily  . triamterene-hydrochlorothiazide  1 tablet Oral Daily    Prescriptions prior to admission  Medication Sig Dispense Refill  . aspirin EC 81 MG tablet Take 81 mg by mouth daily.        . Calcium Carbonate-Vitamin D (CALCIUM 600 + D PO) Take 1 tablet by mouth 2 (two) times daily.       . diclofenac (VOLTAREN) 50 MG EC tablet Take 50 mg by mouth 2 (two) times daily.       . finasteride (PROSCAR) 5 MG tablet Take 5 mg by mouth daily.        . fluticasone (FLONASE) 50 MCG/ACT nasal spray Place 1 spray into both nostrils daily as needed for allergies.       Marland Kitchen gabapentin (NEURONTIN) 100 MG capsule Take 1 capsule by mouth 2 (two) times daily.      . irbesartan (AVAPRO) 150 MG tablet Take 150 mg by mouth every morning.       . meclizine (ANTIVERT) 25 MG tablet Take 25  mg by mouth 2 (two) times daily as needed for dizziness.       . metoprolol succinate (TOPROL-XL) 50 MG 24 hr tablet Take 1 tablet (50 mg total) by mouth daily.  90 tablet  0  . Multiple Vitamin (MULTIVITAMIN PO) Take by mouth. ALIVE men's vitamin      . omeprazole (PRILOSEC) 20 MG capsule Take 20 mg by mouth daily.        Marland Kitchen triamterene-hydrochlorothiazide (DYAZIDE) 37.5-25 MG per capsule Take 1 capsule by mouth every morning.        . cyclobenzaprine (FLEXERIL) 10 MG tablet Take 10 mg by mouth 3 (three) times daily as needed for muscle spasms.       . mometasone (ELOCON) 0.1 % cream Apply 1 application topically daily as needed (for irritation).          Allergies  Allergen Reactions  . Sinequan [Doxepin Hcl] Other (See Comments)    hallucinations  . Verapamil Other (See  Comments)    Caused heart to race  . Procaine Hcl Other (See Comments)    Tingling all over as soon as it was injected    History   Social History  . Marital Status: Widowed    Spouse Name: N/A    Number of Children: N/A  . Years of Education: N/A   Occupational History  . Retired   Social History Main Topics  . Smoking status: Former Smoker -- 2.00 packs/day for 30 years    Types: Cigarettes    Quit date: 10/22/1977  . Smokeless tobacco: Former Systems developer    Quit date: 10/22/1984  . Alcohol Use: 4.2 oz/week    7 Cans of beer per week  . Drug Use: No  . Sexual Activity: Not on file   Other Topics Concern  . Not on file   Social History Narrative  . No narrative on file    Family History  Problem Relation Age of Onset  . Heart disease Mother   . COPD Mother    Family Status  Relation Status Death Age  . Mother Deceased 55    COPD  . Father Deceased 17    unknown    PHYSICAL EXAM: Filed Vitals:   10/30/13 0516  BP: 126/71  Pulse: 55  Temp: 98 F (36.7 C)  Resp: 18     Intake/Output Summary (Last 24 hours) at 10/30/13 1042 Last data filed at 10/30/13 0815  Gross per 24 hour  Intake    240 ml  Output   1050 ml  Net   -810 ml    General:  Well appearing. No respiratory difficulty HEENT: normal Neck: supple. no JVD. Carotids 2+ bilat; right post-op site with small hematomaNo lymphadenopathy or thryomegaly appreciated. Cor: PMI nondisplaced. Regular rate & rhythm. No rubs, gallops, soft systolic murmur Lungs: slightly decreased BS bases Abdomen: soft, nontender, nondistended. No hepatosplenomegaly. No bruits or masses. Good bowel sounds. Extremities: no cyanosis, clubbing, rash, pedal edema on left, distal pulses intact Neuro: alert & oriented x 3, cranial nerves grossly intact. moves all 4 extremities w/o difficulty. Affect pleasant.  Telemetry:  Sinus rhythm, sinus bradycardia with occasional PVCs and PACs ECG: 10/30/2013 Vent. rate 71 BPM PR  interval 178 ms QRS duration 150 ms QT/QTc 402/436 ms P-R-T axes 1 -26 131  10/23/2013 Vent. rate 60 BPM PR interval * ms QRS duration 116 ms QT/QTc 404/404 ms P-R-T axes 23 -17 59  ECHO: pending  Cardiac Cath: 10/12/2013 1. Left main; normal  2. LAD; total occluded in the midportion after the second diagonal branch  3. Left circumflex; nondominant with total occlusion of the second marginal branch with faint left to left collaterals. The first obtuse marginal branch arose in the ramus distribution, with small in caliber, moderate in length and had a 75% focal proximal stenosis.  4. Right coronary artery; dominant with a 50-60% proximal stenosis on the first bend. The RCA gave off a faint collaterals to the distal LAD. The PDA had a 70% focal lesion in the midportion and the PLA 1 had a 95% stenosis. This was a small vessel  5. Left ventriculography; RAO left ventriculogram was performed using  25 mL of Visipaque dye at 12 mL/second. The overall LVEF estimated  50 % With wall motion abnormalities notable for mild anteroapical hypokinesia  IMPRESSION:Henry Neal has 3 vessel disease with occluded LAD and circumflex in the midportion, moderate proximal RCA disease which does not appear to be hematoma with significant and preserved LV function. His Myoview is intermediate risk. At this point he is minimally symptomatic. I think he can undergo endarterectomy at moderately increased risk given his anatomy and possible social study. Another option would be carotid artery stenting. I have relayed this information to Dr. Oneida Alar, is asked a surgeon. Dr. Sallyanne Kuster review the anatomy with me as well. The sheath was removed and pressure was held on the groin to achieve hemostasis. The patient left the Cath Lab in stable condition. You'll be gently hydrated for the next 4 hours, discharged home and will followup in our    Results for orders placed during the hospital encounter of 10/27/13 (from the past 72  hour(s))  CBC     Status: Abnormal   Collection Time    10/27/13  8:00 PM      Result Value Range   WBC 9.2  4.0 - 10.5 K/uL   RBC 3.45 (*) 4.22 - 5.81 MIL/uL   Hemoglobin 11.1 (*) 13.0 - 17.0 g/dL   HCT 32.5 (*) 39.0 - 52.0 %   MCV 94.2  78.0 - 100.0 fL   MCH 32.2  26.0 - 34.0 pg   MCHC 34.2  30.0 - 36.0 g/dL   RDW 13.4  11.5 - 15.5 %   Platelets 147 (*) 150 - 400 K/uL  CREATININE, SERUM     Status: Abnormal   Collection Time    10/27/13  8:00 PM      Result Value Range   Creatinine, Ser 1.02  0.50 - 1.35 mg/dL   GFR calc non Af Amer 65 (*) >90 mL/min   GFR calc Af Amer 76 (*) >90 mL/min  CBC     Status: Abnormal   Collection Time    10/28/13  5:00 AM      Result Value Range   WBC 8.4  4.0 - 10.5 K/uL   RBC 3.12 (*) 4.22 - 5.81 MIL/uL   Hemoglobin 10.0 (*) 13.0 - 17.0 g/dL   HCT 29.6 (*) 39.0 - 52.0 %   MCV 94.9  78.0 - 100.0 fL   MCH 32.1  26.0 - 34.0 pg   MCHC 33.8  30.0 - 36.0 g/dL   RDW 13.6  11.5 - 15.5 %   Platelets 124 (*) 150 - 400 K/uL  BASIC METABOLIC PANEL     Status: Abnormal   Collection Time    10/28/13  5:00 AM      Result Value Range   Sodium 136 (*) 137 - 147 mEq/L  Potassium 4.1  3.7 - 5.3 mEq/L   Chloride 103  96 - 112 mEq/L   CO2 23  19 - 32 mEq/L   Glucose, Bld 113 (*) 70 - 99 mg/dL   BUN 20  6 - 23 mg/dL   Creatinine, Ser 0.95  0.50 - 1.35 mg/dL   Calcium 7.7 (*) 8.4 - 10.5 mg/dL   GFR calc non Af Amer 74 (*) >90 mL/min   GFR calc Af Amer 86 (*) >90 mL/min  TROPONIN I         Collection Time    10/30/13  9:51 AM      Result Value Range   Troponin I <0.30  <0.30 ng/mL   Radiology:  Dg Chest 2 View 10/09/2013   CLINICAL DATA:  Cardiac catheterization next week.  EXAM: CHEST  2 VIEW  COMPARISON:  Plain film of 02/22/2009 and CT of 08/30/2011  FINDINGS: A compression deformity is moderate and chronic involving the T12 vertebral body and. Abdominal aortic stent graft.  Midline trachea. Normal heart size. Mild left hemidiaphragm eventration  posteriorly. No pleural effusion or pneumothorax. No congestive failure. Tortuous thoracic aorta. Clear lungs.  IMPRESSION: No acute cardiopulmonary disease.   Electronically Signed   By: Abigail Miyamoto M.D.   On: 10/09/2013 14:54    ASSESSMENT: 1. Carotid artery stenosis 2. Anginal pain  PLAN/DISCUSSION: Henry Neal is an 80 yo male with recent diagnosis of CAD. He had a in December 2014 and has been medically treated for CAD. He had a right CEA on 01/06. He has had multiple episodes of back/shoulder/arm pain with exertion and at rest. His treatment for CAD includes ASA/BB and ARB but no statin or long-acting nitrates. Atorvastatin 10 mg was added here, but will increase to 40 mg. His BP after the surgery was low but is now higher, probably how he runs at home. He has sinus bradycardia and minor ectopy but no arrythmia.   MD advise on adding nitrates and increase statin, f/u in office and re-assessing for PCI.   Rosaria Ferries, PA-C 10/30/2013 10:42 AM Beeper [phone]  Patient seen and examined with Rosaria Ferries, PA-C. We discussed all aspects of the encounter. I agree with the assessment and plan as stated above.   I reviewed patient's cath and myoview films personally. Has severe CAD with T LAD and occluded OM-2. Myoview with anterior/anteroseptal scar and with moderate peri-infarct ischemia. Still works full-time as Furniture conservator/restorer. No exertional CP or symptoms. But occasional resting jaw and scapular pain.  Tolerated CEA well but post-op developed scapular pain and new LBBB concerning for ischemia. However troponins remain negative. Now feels OK.  He certainly has severe CAD but I am not convinced that all his symptoms are ischemic in nature. Would continue to treat medically. Will add Imdur and statin. Change metoprolol to carvedilol for better BP control. Can go home when ready for a surgical standpoint and f/u with Dr. Gwenlyn Found. If symptoms persist and felt to be anginal could consider attempted  CTO of LAD (I don't think CABG is best idea for him).   Will sign off. Please call with questions.  Daniel Bensimhon,MD 12:22 PM

## 2013-11-02 ENCOUNTER — Telehealth: Payer: Self-pay | Admitting: *Deleted

## 2013-11-02 DIAGNOSIS — R112 Nausea with vomiting, unspecified: Secondary | ICD-10-CM

## 2013-11-02 DIAGNOSIS — Z9889 Other specified postprocedural states: Principal | ICD-10-CM

## 2013-11-02 MED ORDER — PROMETHAZINE HCL 12.5 MG PO TABS: 12.5000 mg | ORAL_TABLET | Freq: Four times a day (QID) | ORAL | Status: DC | PRN

## 2013-11-02 NOTE — Telephone Encounter (Signed)
Patient's insurance will not cover Zofran that was rx'd at discharge.  Changed to 12.5 of phenergan faxed to [facility] per Dr. Kellie Simmering

## 2013-11-12 ENCOUNTER — Encounter: Payer: Self-pay | Admitting: *Deleted

## 2013-11-12 ENCOUNTER — Telehealth: Payer: Self-pay | Admitting: *Deleted

## 2013-11-12 NOTE — Telephone Encounter (Signed)
Patient called requesting a release to return to work.  Patient states that he is not having any pain or problems relating to his carotid surgery and works as a Furniture conservator/restorer.  I discussed this with Dr Oneida Alar and he approved the return to work request.

## 2013-11-13 ENCOUNTER — Encounter: Payer: Self-pay | Admitting: Cardiovascular Disease

## 2013-11-16 ENCOUNTER — Ambulatory Visit (INDEPENDENT_AMBULATORY_CARE_PROVIDER_SITE_OTHER): Payer: Medicare Other | Admitting: Cardiology

## 2013-11-16 VITALS — BP 140/60 | HR 68 | Ht 70.0 in | Wt 203.0 lb

## 2013-11-16 DIAGNOSIS — I2510 Atherosclerotic heart disease of native coronary artery without angina pectoris: Secondary | ICD-10-CM

## 2013-11-16 MED ORDER — NITROGLYCERIN 0.4 MG SL SUBL: 0.4000 mg | SUBLINGUAL_TABLET | SUBLINGUAL | Status: DC | PRN

## 2013-11-16 NOTE — Patient Instructions (Signed)
Continue taking medications as prescribed. Call for an appointment with Dr. Gwenlyn Found if your condition worsens.

## 2013-11-18 ENCOUNTER — Encounter: Payer: Self-pay | Admitting: Vascular Surgery

## 2013-11-18 ENCOUNTER — Encounter: Payer: Self-pay | Admitting: Cardiology

## 2013-11-18 NOTE — Assessment & Plan Note (Addendum)
No SSCP. Will continue on Medical thearpy. His recent complaints of shoulder/scapular pain seem to be more consistent with arthritis. He was noted to have bilateral shoulder joint crepitus on physical exam, also c/w arthritis. Will continue ASA, Coreg, Imdur and Lipitor. His PCP has him on Voltaren for arthritis.

## 2013-11-18 NOTE — Progress Notes (Signed)
Patient ID: Henry Neal, male   DOB: 08/16/1927, 80 y.o.   MRN: [id number]  11/18/2013 Henry Neal   13-Jun-1927  [id number]  Primary Physicia Merrilee Seashore, MD Primary Cardiologist: Dr. Sallyanne Kuster  HPI:  The patient is a 80 y/o male, followed by Dr. Sallyanne Kuster. He has a history of hypertension, AAA, s/p endoluminal stent graft in 2000, and carotid artery disease. He recently underwent a right CEA on Oct 27, 2013. Prior to undergoing the procedure, he required work-up for surgical clearance. He had a Myoview NST that was abnormal, showing an anteroapical/septal scar with moderate peri-infarct ischemia. Subsequently, he underwent a LHC by Dr. Gwenlyn Found on 10/12/13. He was found to have 3 vessel disease with an occluded LAD and circumflex in the midportion, moderate proximal RCA disease which did not appear to be hemodynamically significant and preserved LV function. He had been minimally symptomatic. Dr. Gwenlyn Found felt that his CAD could be managed medically and cleared him for surgery.  Post CEA, he complained of intermittent back/shoulder/arm pain and Cardiology was consulted to see. An EKG also showed a new LBBB, however his troponins remained negative. He was evaluated by Dr. Haroldine Laws (see consult note from 10/30/13). Dr. Haroldine Laws personally reviewed his cath and myoview films and after examining him he did not feel that all of his symptoms were ischemic in nature. 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 Henry Neal had also denied any exertional chest pain. Dr. Haroldine Laws decided to continue him on medical therapy. He added Imdur as well as a statin. He also changed metoprolol to carvedilol for better BP control.   He returns to clinic today for post-hospital follow-up. He states that he has been doing fairly well. He continues to deny substernal chest pain/pressure/tightness. No exertional chest pain. He  continues to have intermittent shoulder/scapular pain. He denies SOB, orthopnea, PND, LEE. No syncope/ near syncope.   Current Outpatient Prescriptions  Medication Sig Dispense Refill  . aspirin EC 81 MG tablet Take 81 mg by mouth daily.        Marland Kitchen atorvastatin (LIPITOR) 40 MG tablet Take 1 tablet (40 mg total) by mouth daily at 6 PM.  30 tablet  2  . Calcium Carbonate-Vitamin D (CALCIUM 600 + D PO) Take 1 tablet by mouth 2 (two) times daily.       . carvedilol (COREG) 6.25 MG tablet Take 1 tablet (6.25 mg total) by mouth 2 (two) times daily with a meal.  60 tablet  2  . cyclobenzaprine (FLEXERIL) 10 MG tablet Take 10 mg by mouth 3 (three) times daily as needed for muscle spasms.       . diclofenac (VOLTAREN) 50 MG EC tablet Take 50 mg by mouth 2 (two) times daily.       . finasteride (PROSCAR) 5 MG tablet Take 5 mg by mouth daily.        . fluticasone (FLONASE) 50 MCG/ACT nasal spray Place 1 spray into both nostrils daily as needed for allergies.       Marland Kitchen gabapentin (NEURONTIN) 100 MG capsule Take 1 capsule by mouth 2 (two) times daily.      . irbesartan (AVAPRO) 150 MG tablet Take 150 mg by mouth every morning.       . isosorbide mononitrate (IMDUR) 30 MG 24 hr tablet Take 1 tablet (30 mg total) by mouth daily.  30 tablet  2  . meclizine (ANTIVERT)  25 MG tablet Take 25 mg by mouth 2 (two) times daily as needed for dizziness.       . mometasone (ELOCON) 0.1 % cream Apply 1 application topically daily as needed (for irritation).       . Multiple Vitamin (MULTIVITAMIN PO) Take by mouth. ALIVE men's vitamin      . omeprazole (PRILOSEC) 20 MG capsule Take 20 mg by mouth daily.        Marland Kitchen oxyCODONE (ROXICODONE) 5 MG immediate release tablet Take 1 tablet (5 mg total) by mouth every 6 (six) hours as needed for severe pain.  30 tablet  0  . promethazine (PHENERGAN) 12.5 MG tablet Take 1 tablet (12.5 mg total) by mouth every 6 (six) hours as needed for nausea or vomiting.  20 tablet  0  .  triamterene-hydrochlorothiazide (DYAZIDE) 37.5-25 MG per capsule Take 1 capsule by mouth every morning.        . nitroGLYCERIN (NITROSTAT) 0.4 MG SL tablet Place 1 tablet (0.4 mg total) under the tongue every 5 (five) minutes as needed for chest pain.  25 tablet  2   No current facility-administered medications for this visit.    Allergies  Allergen Reactions  . Sinequan [Doxepin Hcl] Other (See Comments)    hallucinations  . Verapamil Other (See Comments)    Caused heart to race  . Procaine Hcl Other (See Comments)    Tingling all over as soon as it was injected    History   Social History  . Marital Status: Widowed    Spouse Name: N/A    Number of Children: N/A  . Years of Education: N/A   Occupational History  . Not on file.   Social History Main Topics  . Smoking status: Former Smoker -- 2.00 packs/day for 30 years    Types: Cigarettes    Quit date: 10/22/1977  . Smokeless tobacco: Former Systems developer    Quit date: 10/22/1984  . Alcohol Use: 4.2 oz/week    7 Cans of beer per week  . Drug Use: No  . Sexual Activity: Not on file   Other Topics Concern  . Not on file   Social History Narrative  . No narrative on file     Review of Systems: General: negative for chills, fever, night sweats or weight changes.  Cardiovascular: negative for chest pain, dyspnea on exertion, edema, orthopnea, palpitations, paroxysmal nocturnal dyspnea or shortness of breath Dermatological: negative for rash Respiratory: negative for cough or wheezing Urologic: negative for hematuria Abdominal: negative for nausea, vomiting, diarrhea, bright red blood per rectum, melena, or hematemesis Neurologic: negative for visual changes, syncope, or dizziness All other systems reviewed and are otherwise negative except as noted above.    Blood pressure 140/60, pulse 68, height 5\' 10"  (1.778 m), weight 203 lb (92.08 kg).  General appearance: alert, cooperative and no distress Neck: no carotid bruit  and no JVD Lungs: clear to auscultation bilaterally Heart: regular rate and rhythm, S1, S2 normal, no murmur, click, rub or gallop Extremities: no LEE, upper extremities positive for bilateral shoulder crepitus c/w arthritis Pulses: 2+ and symmetric Skin: warm and dry Neurologic: Grossly normal  EKG LBBB (old) NRS, HR 68 bpm  ASSESSMENT AND PLAN:   Coronary atherosclerosis of native coronary artery Will continue on Medical thearpy. His recent complaints of shoulder/scapular pain seem to be more consistent with arthritis. He was noted to have bilateral shoulder joint crepitus on physical exam, also c/w arthritis. Will continue ASA, Coreg, Imdur and  Lipitor.     PLAN  Will continue medical therapy for CAD. Resume ASA, BB, Nitrate and stain. He will continue Voltaren, as prescribed by his PCP for arthritis. If his symptoms persist for worsens, particularly if he develops chest discomfort, he was directed to follow-up with Dr. Gwenlyn Found. If this happens ? the need for an attempt at CTO of LAD.    SIMMONS, BRITTAINYPA-C 11/18/2013 12:30 PM

## 2013-11-19 ENCOUNTER — Ambulatory Visit (INDEPENDENT_AMBULATORY_CARE_PROVIDER_SITE_OTHER): Payer: Medicare Other | Admitting: Vascular Surgery

## 2013-11-19 ENCOUNTER — Encounter: Payer: Self-pay | Admitting: Vascular Surgery

## 2013-11-19 VITALS — BP 131/63 | HR 65 | Temp 98.1°F | Ht 70.0 in | Wt 203.4 lb

## 2013-11-19 DIAGNOSIS — I714 Abdominal aortic aneurysm, without rupture, unspecified: Secondary | ICD-10-CM

## 2013-11-19 DIAGNOSIS — I6529 Occlusion and stenosis of unspecified carotid artery: Secondary | ICD-10-CM

## 2013-11-19 DIAGNOSIS — M25519 Pain in unspecified shoulder: Secondary | ICD-10-CM

## 2013-11-19 DIAGNOSIS — Z48812 Encounter for surgical aftercare following surgery on the circulatory system: Secondary | ICD-10-CM

## 2013-11-19 NOTE — Addendum Note (Signed)
Addended by: Mena Goes on: 11/19/2013 11:36 AM   Modules accepted: Orders

## 2013-11-19 NOTE — Progress Notes (Signed)
Patient is an 80 year old male who returns today for followup after right carotid endarterectomy. He reports no episodes of TIA amaurosis stroke weakness or numbness in his extremities. He does have occasional bilateral numbness and tingling in his hands which radiates from bilateral shoulder pain. The shoulder pt was evaluated in the hospital for possible cardiac source origin in the workup overall was negative. The patient is followed by Dr. Gwenlyn Found.  Physical exam:  Filed Vitals:   11/19/13 0854 11/19/13 0855  BP: 145/61 131/63  Pulse: 65   Temp:  98.1 F (36.7 C)  TempSrc:  Oral  Height: 5\' 10"  (1.778 m)   Weight: 203 lb 6.4 oz (92.262 kg)   SpO2: 98%     Neck: 2 cm area of separation at the upper portion of the incision no significant drainage no erythema Neuro: Symmetric upper extremity and lower extremity motor strength 5 over 5  Assessment: Doing well status post right carotid endarterectomy. Slight separation upper portion of neck incision we'll do local wound care. Has chronic complaints of bilateral shoulder problems. This was evaluated in the past several years ago. Since he is having some occasional numbness and tingling in his hands I believe it warrants further evaluation to see if his shoulders are the cause of his pain rather than a cardiac source origin. We will refer him to Dr. Rhona Raider for evaluation of his shoulders.  Plan: See above, followup with me in 6 months for repeat carotid duplex exam in one year for repeat duplex exam of his aortic aneurysm stent graft

## 2013-12-06 ENCOUNTER — Other Ambulatory Visit: Payer: Self-pay | Admitting: Cardiovascular Disease

## 2013-12-25 ENCOUNTER — Other Ambulatory Visit: Payer: Self-pay

## 2013-12-29 ENCOUNTER — Encounter: Payer: Self-pay | Admitting: Podiatry

## 2013-12-29 ENCOUNTER — Ambulatory Visit (INDEPENDENT_AMBULATORY_CARE_PROVIDER_SITE_OTHER): Payer: Medicare Other | Admitting: Podiatry

## 2013-12-29 ENCOUNTER — Ambulatory Visit: Payer: Medicare Other | Admitting: Podiatry

## 2013-12-29 VITALS — BP 123/55 | HR 70 | Resp 18

## 2013-12-29 DIAGNOSIS — M79609 Pain in unspecified limb: Secondary | ICD-10-CM

## 2013-12-29 DIAGNOSIS — B351 Tinea unguium: Secondary | ICD-10-CM

## 2013-12-29 NOTE — Progress Notes (Signed)
Just the toenails .  Objective: Vital signs are stable he is alert and oriented x3. She has pain on palpation to nails 1 through 5 bilateral is cover service secondary to pain.   Assessment: Pain in limb secondary to onychomycosis 1 through 5 bilateral.  Plan: Treatment of nails 1 through 5 bilateral covered service secondary to pain.

## 2014-01-16 ENCOUNTER — Other Ambulatory Visit: Payer: Self-pay | Admitting: Vascular Surgery

## 2014-01-26 ENCOUNTER — Other Ambulatory Visit: Payer: Self-pay | Admitting: Vascular Surgery

## 2014-02-08 DIAGNOSIS — D0330 Melanoma in situ of unspecified part of face: Secondary | ICD-10-CM | POA: Insufficient documentation

## 2014-03-19 ENCOUNTER — Encounter: Payer: Self-pay | Admitting: *Deleted

## 2014-04-01 ENCOUNTER — Encounter: Payer: Self-pay | Admitting: Podiatry

## 2014-04-01 ENCOUNTER — Ambulatory Visit (INDEPENDENT_AMBULATORY_CARE_PROVIDER_SITE_OTHER): Payer: Medicare Other | Admitting: Podiatry

## 2014-04-01 DIAGNOSIS — B351 Tinea unguium: Secondary | ICD-10-CM

## 2014-04-01 DIAGNOSIS — M79609 Pain in unspecified limb: Secondary | ICD-10-CM

## 2014-04-02 NOTE — Progress Notes (Signed)
He presents today with a chief complaint of painful elongated toenails one through 5 bilateral.  Objective: Pulses are palpable nails are thick yellow dystrophic with mycotic and painful palpation.  Assessment: Pain in limb secondary onychomycosis.  Plan: Debridement of nails 1 through 5 bilateral covered service secondary to pain.

## 2014-05-12 ENCOUNTER — Encounter: Payer: Self-pay | Admitting: Cardiovascular Disease

## 2014-05-18 ENCOUNTER — Encounter: Payer: Self-pay | Admitting: Cardiovascular Disease

## 2014-05-18 ENCOUNTER — Ambulatory Visit (INDEPENDENT_AMBULATORY_CARE_PROVIDER_SITE_OTHER): Payer: Medicare Other | Admitting: Cardiovascular Disease

## 2014-05-18 VITALS — BP 134/60 | HR 62 | Ht 70.5 in | Wt 202.0 lb

## 2014-05-18 DIAGNOSIS — E785 Hyperlipidemia, unspecified: Secondary | ICD-10-CM

## 2014-05-18 DIAGNOSIS — IMO0001 Reserved for inherently not codable concepts without codable children: Secondary | ICD-10-CM

## 2014-05-18 DIAGNOSIS — I6521 Occlusion and stenosis of right carotid artery: Secondary | ICD-10-CM

## 2014-05-18 DIAGNOSIS — I10 Essential (primary) hypertension: Secondary | ICD-10-CM

## 2014-05-18 DIAGNOSIS — I6529 Occlusion and stenosis of unspecified carotid artery: Secondary | ICD-10-CM

## 2014-05-18 NOTE — Progress Notes (Signed)
05/18/2014 Henry Neal   1926-12-27  [id number]  Primary Physician Merrilee Seashore, MD Primary Cardiologist: Lorretta Harp MD Renae Gloss   HPI:  Henry Neal is a delightful 80 year old widowed Caucasian male father of 2, grandfather of one grandchild patient, of Dr. Sallyanne Kuster, Oneida Alar and [place]. He has a history of carotid artery disease and  he was scheduled to undergo elective right carotid endarterectomy by Dr. Oneida Alar in early January. He also has had an endoluminal stent graft placed by Dr. Oneida Alar of May 2000 and for abdominal aortic aneurysm. His cardiovascular risk factors are positive for hypertension and hyperlipidemia. He has never had a heart attack or stroke. He denies chest pain or shortness of breath. He Myoview stress test from yesterday that showed anteroapical/septal scar with moderate peri-infarct ischemia. Based on this he underwent diagnostic coronary arteriography on 10/10/14 revealing three-vessel disease with an occluded LAD in the midportion, an occluded circumflex marginal branch and moderate RCA disease with preserved LV function. Because he was relatively asymptomatic I elected to treat him medically. He underwent elective right carotid endarterectomy on 10/30/13 and had an uncomplicated postoperative course. He was seen postop for her jaw in neck and arm pain and his medicines were adjusted by Dr. Haroldine Laws .    Current Outpatient Prescriptions  Medication Sig Dispense Refill  . aspirin EC 81 MG tablet Take 81 mg by mouth daily.        Marland Kitchen atorvastatin (LIPITOR) 40 MG tablet Take 1 tablet (40 mg total) by mouth daily at 6 PM.  30 tablet  2  . cyclobenzaprine (FLEXERIL) 10 MG tablet Take 10 mg by mouth 3 (three) times daily as needed for muscle spasms.       . diclofenac (VOLTAREN) 50 MG EC tablet Take 50 mg by mouth 2 (two) times daily.       . finasteride (PROSCAR) 5 MG tablet Take 5 mg by mouth daily.        . fluticasone (FLONASE) 50 MCG/ACT  nasal spray Place 1 spray into both nostrils daily as needed for allergies.       Marland Kitchen gabapentin (NEURONTIN) 100 MG capsule Take 1 capsule by mouth 2 (two) times daily.      . irbesartan (AVAPRO) 150 MG tablet Take 150 mg by mouth every morning.       . isosorbide mononitrate (IMDUR) 30 MG 24 hr tablet Take 1 tablet (30 mg total) by mouth daily.  30 tablet  2  . meclizine (ANTIVERT) 25 MG tablet Take 25 mg by mouth 2 (two) times daily as needed for dizziness.       . mometasone (ELOCON) 0.1 % cream Apply 1 application topically daily as needed (for irritation).       . Multiple Vitamin (MULTIVITAMIN PO) Take by mouth. ALIVE men's vitamin      . nitroGLYCERIN (NITROSTAT) 0.4 MG SL tablet Place 1 tablet (0.4 mg total) under the tongue every 5 (five) minutes as needed for chest pain.  25 tablet  2  . omeprazole (PRILOSEC) 20 MG capsule Take 20 mg by mouth daily.        Marland Kitchen triamterene-hydrochlorothiazide (MAXZIDE-25) 37.5-25 MG per tablet take 1 tablet by mouth once daily  90 tablet  2  . Calcium Carbonate-Vitamin D (CALCIUM 600 + D PO) Take 1 tablet by mouth 2 (two) times daily.       . carvedilol (COREG) 6.25 MG tablet Take 1 tablet (6.25 mg total) by mouth 2 (two) times  daily with a meal.  60 tablet  2   No current facility-administered medications for this visit.    Allergies  Allergen Reactions  . Sinequan [Doxepin Hcl] Other (See Comments)    hallucinations  . Verapamil Other (See Comments)    Caused heart to race  . Procaine Hcl Other (See Comments)    Tingling all over as soon as it was injected    History   Social History  . Marital Status: Widowed    Spouse Name: N/A    Number of Children: N/A  . Years of Education: N/A   Occupational History  . Not on file.   Social History Main Topics  . Smoking status: Former Smoker -- 2.00 packs/day for 30 years    Types: Cigarettes    Quit date: 10/22/1977  . Smokeless tobacco: Former Systems developer    Quit date: 10/22/1984  . Alcohol Use:  4.2 oz/week    7 Cans of beer per week  . Drug Use: No  . Sexual Activity: Not on file   Other Topics Concern  . Not on file   Social History Narrative  . No narrative on file     Review of Systems: General: negative for chills, fever, night sweats or weight changes.  Cardiovascular: negative for chest pain, dyspnea on exertion, edema, orthopnea, palpitations, paroxysmal nocturnal dyspnea or shortness of breath Dermatological: negative for rash Respiratory: negative for cough or wheezing Urologic: negative for hematuria Abdominal: negative for nausea, vomiting, diarrhea, bright red blood per rectum, melena, or hematemesis Neurologic: negative for visual changes, syncope, or dizziness All other systems reviewed and are otherwise negative except as noted above.    Blood pressure 134/60, pulse 62, height 5' 10.5" (1.791 m), weight 202 lb (91.627 kg).  General appearance: alert and no distress Neck: no adenopathy, no carotid bruit, no JVD, supple, symmetrical, trachea midline and thyroid not enlarged, symmetric, no tenderness/mass/nodules Lungs: clear to auscultation bilaterally Heart: regular rate and rhythm, S1, S2 normal, no murmur, click, rub or gallop Extremities: extremities normal, atraumatic, no cyanosis or edema  EKG not performed today  ASSESSMENT AND PLAN:   LBBB (left bundle branch block) New since last EKG  Carotid artery stenosis Status post elective right carotid endarterectomy ectomy performed by Dr. Oneida Alar 10/27/13  Essential hypertension Controlled on current medications  Hyperlipidemia On statin therapy followed by his PCP  Coronary atherosclerosis of native coronary artery Status post cardiac catheterization performed by myself 10/12/13 revealing 2 vessel disease with occluded LAD midportion, occluded circumflex marginal branch and moderate RCA disease with preserved LV function. Despite having an abnormal Myoview he was relatively asymptomatic and has  been treated medically.      Lorretta Harp MD FACP,FACC,FAHA, [facility] 05/18/2014 10:45 AM

## 2014-05-18 NOTE — Assessment & Plan Note (Signed)
Controlled on current medications 

## 2014-05-18 NOTE — Assessment & Plan Note (Signed)
On statin therapy followed by his PCP 

## 2014-05-18 NOTE — Assessment & Plan Note (Signed)
Status post elective right carotid endarterectomy ectomy performed by Dr. Oneida Alar 10/27/13

## 2014-05-18 NOTE — Assessment & Plan Note (Signed)
Status post cardiac catheterization performed by myself 10/12/13 revealing 2 vessel disease with occluded LAD midportion, occluded circumflex marginal branch and moderate RCA disease with preserved LV function. Despite having an abnormal Myoview he was relatively asymptomatic and has been treated medically.

## 2014-05-18 NOTE — Assessment & Plan Note (Signed)
New since last EKG

## 2014-05-18 NOTE — Patient Instructions (Signed)
Your physician recommends that you schedule a follow-up appointment in: 6 Months with PA and 12 Months with Dr Gwenlyn Found    Cardiac Rehabilitation Cardiac rehabilitation is a medically supervised program that helps improve the health and well-being of people with heart problems. Cardiac rehabilitation includes exercise training, education, and counseling to help you get stronger and return to an active lifestyle. People who participate in cardiac rehabilitation programs get better faster and reduce future hospital stays. Cardiac rehabilitation programs can help when you have had the following conditions:  Heart attack.  Heart failure.  Peripheral artery disease.  Coronary artery disease.  Angina.  Lung or breathing problems. Cardiac rehabilitation programs are also used when you have the following procedures:  Coronary artery bypass graft surgery.  Heart valve replacement.  Heart stent placement.  Heart transplant.  Aneurysm repair. CARDIAC REHABILITATION MAY HELP YOU:  Reduce problems like chest pain and trouble breathing.  Change risk factors that contribute to heart disease, such as:  Smoking.  High blood pressure.  High cholesterol.  Diabetes.  Being out of shape or not active.  Weighing more than 30% over your ideal weight.  Diet.  Improve your mental outlook so you feel:  Less depressed or "blue."  More hopeful.  Better about yourself.  More confident about taking care of yourself.  Get support from health experts as well as other people with similar problems.  Learn how to manage and understand your medicines.  Teach your family about your condition and how to participate in your recovery. WHAT HAPPENS IN CARDIAC REHABILITATION? You will be assessed by a cardiac rehabilitation team. They will check your health history and do a physical exam. You may need blood tests, stress tests, and other evaluations. You may not start a cardiac rehabilitation  program if:  You develop angina with exercise or while at rest.  You have severe heart failure that limits your activity.  You have an abnormal heart rhythm at rest.  You develop heart rhythm problems during exercise.  You have high blood pressure that is not controlled. The cardiac rehabilitation team works with you to make a plan based on your health and goals. Everyone is unique, so each program is customized and your program may change as you progress. Members of a typical cardiac rehabilitation team may include such health professionals as:  Doctors.  Nurses.  Dietitians.  Psychologists.  Exercise specialists.  Physical and occupational therapists. A typical cardiac rehabilitation program is divided into phases. You advance from one phase to the next. Most cardiac rehabilitation sessions last for 60 minutes, 3 times a week.  Phase One starts while you are still in the hospital. You may start by walking in your room and then in the hall. You may start some simple exercises with a therapist. Health care team members will give you information and ask you many questions. You may not be able to remember details, so have a family member or an advocate with you to help keep track of information.  Phase Two begins when you go home or to another facility. This phase may last 8 to 12 weeks. You will travel to a cardiac rehabilitation center or a place where it is offered. Typically, you gradually increase your activity while being closely watched by a nurse or therapist. Exercises may be a combination of strength or resistance training and "cardio" or aerobic movement on a treadmill or other machines. Your condition will determine how often and how long these sessions will last.  In phase two, you may learn how to cook healthy meals, control your blood sugar, and manage your medicines. You may need help with scheduling or planning how and when to take your medicines. Use a timer, divided pill  box, or follow a form to make taking your medicines easier. Use the method that works best for you. Some medicines should not be taken with certain foods. If you take more than one blood pressure medicine, you may need to stagger the times you take them. Taking all your blood pressure medicine at the same time may lower your blood pressure too much. If you have questions about your medicines, ask your health care provider questions until you understand.  Phase Three continues for the rest of your life. There will be less supervision. You may still participate in cardiac rehabilitation activities or become part of a group in your community. You may benefit from talking to other people about your experience if they are facing similar challenges. How soon you drive, have sex, or return to work will depend on your condition. These decisions should be made by you and your health care provider. If you need help, ask for it. Find out where you can get the help you need. Ask questions until you get answers and understand. SEEK IMMEDIATE MEDICAL CARE IF:  Get medical help at once if you experience any of the following symptoms:  Severe chest discomfort, especially if the pain is crushing or pressure-like and spreads to the arms, back, neck, or jaw. Do not wait to see if the pain will go away.  Weakness or numbness in your face, arms, or legs, especially on one side of the body; slurred speech; confusion; sudden severe headache or loss of vision (all symptoms of stroke).  You have shortness of breath.  You are sweating and feel sick to your stomach (nausea).  You feel dizzy or faint.  You experience profound tiredness (fatigue). Call your local emergency service (911 in the [place] Do not drive yourself to the hospital. Document Released: 07/17/2008 Document Revised: 02/22/2014 Document Reviewed: 01/12/2011 [facility] Patient Information 2015 [place], [place]. This information is not intended to replace advice  given to you by your health care provider. Make sure you discuss any questions you have with your health care provider.

## 2014-05-19 ENCOUNTER — Encounter: Payer: Self-pay | Admitting: Vascular Surgery

## 2014-05-20 ENCOUNTER — Ambulatory Visit (INDEPENDENT_AMBULATORY_CARE_PROVIDER_SITE_OTHER): Payer: Medicare Other | Admitting: Vascular Surgery

## 2014-05-20 ENCOUNTER — Encounter: Payer: Self-pay | Admitting: Vascular Surgery

## 2014-05-20 ENCOUNTER — Ambulatory Visit (HOSPITAL_COMMUNITY)
Admission: RE | Admit: 2014-05-20 | Discharge: 2014-05-20 | Disposition: A | Discharge status: Home / Self Care | Payer: Medicare Other | Source: Ambulatory Visit | Attending: Vascular Surgery | Admitting: Vascular Surgery

## 2014-05-20 VITALS — BP 113/50 | HR 60 | Ht 70.5 in | Wt 200.0 lb

## 2014-05-20 DIAGNOSIS — Z48812 Encounter for surgical aftercare following surgery on the circulatory system: Secondary | ICD-10-CM

## 2014-05-20 DIAGNOSIS — I6529 Occlusion and stenosis of unspecified carotid artery: Secondary | ICD-10-CM | POA: Insufficient documentation

## 2014-05-20 NOTE — Progress Notes (Signed)
Patient is an 80 year old male who returns today for followup after right carotid endarterectomy. He reports no episodes of TIA amaurosis stroke weakness or numbness in his extremities. He does have occasional bilateral numbness and tingling in his hands which radiates from bilateral shoulder pain. The shoulder pt was evaluated in the hospital for possible cardiac source origin in the workup overall was negative. The patient is followed by Dr. Gwenlyn Found.  Physical exam:    Filed Vitals:   05/20/14 1129 05/20/14 1131  BP: 113/55 113/50  Pulse: 60   Height: 5' 10.5" (1.791 m)   Weight: 200 lb (90.719 kg)   SpO2: 97%     Neck: 1 cm area of slight tenderness at the upper portion of the incision no significant drainage no erythema, no bruit Neuro: Symmetric upper extremity and lower extremity motor strength 5 over 5   Assessment: Doing well status post right carotid endarterectomy.  Has chronic complaints of bilateral shoulder problems which was recently evaluated by Dr. Rhona Raider and he is scheduled for injections of this.   Plan: See above, followup with me in 6 months for repeat carotid duplex exam and duplex exam of his aortic aneurysm stent graft  Ruta Hinds, MD [facility]: [phone] Pager: [phone]

## 2014-05-20 NOTE — Addendum Note (Signed)
Addended by: Mena Goes on: 05/20/2014 12:47 PM   Modules accepted: Orders

## 2014-06-15 ENCOUNTER — Other Ambulatory Visit: Payer: Self-pay | Admitting: Internal Medicine

## 2014-06-15 DIAGNOSIS — R1310 Dysphagia, unspecified: Secondary | ICD-10-CM

## 2014-06-23 ENCOUNTER — Other Ambulatory Visit: Payer: Self-pay | Admitting: Internal Medicine

## 2014-06-23 ENCOUNTER — Ambulatory Visit
Admission: RE | Admit: 2014-06-23 | Discharge: 2014-06-23 | Disposition: A | Discharge status: Home / Self Care | Payer: Medicare Other | Source: Ambulatory Visit | Attending: Internal Medicine | Admitting: Internal Medicine

## 2014-06-23 DIAGNOSIS — R1310 Dysphagia, unspecified: Secondary | ICD-10-CM

## 2014-07-01 ENCOUNTER — Ambulatory Visit (INDEPENDENT_AMBULATORY_CARE_PROVIDER_SITE_OTHER): Payer: Medicare Other | Admitting: Podiatry

## 2014-07-01 DIAGNOSIS — B351 Tinea unguium: Secondary | ICD-10-CM

## 2014-07-01 DIAGNOSIS — M79609 Pain in unspecified limb: Secondary | ICD-10-CM

## 2014-07-01 DIAGNOSIS — M79676 Pain in unspecified toe(s): Secondary | ICD-10-CM

## 2014-07-01 NOTE — Progress Notes (Signed)
   Subjective:    Patient ID: Henry Neal, male    DOB: July 13, 1927, 80 y.o.   MRN: [id number]  HPI Pt presents for nail debridement   Review of Systems     Objective:   Physical Exam: Pulses are palpable bilateral. Nails are thick yellow dystrophic onychomycotic and painful palpation.        Assessment & Plan:  Assessment: Pain in limb secondary to onychomycosis 1 through 5 bilateral.  Plan: Debridement of nails 1 through 5 bilateral covered service secondary to pain.

## 2014-09-02 ENCOUNTER — Other Ambulatory Visit: Payer: Self-pay | Admitting: Cardiovascular Disease

## 2014-09-02 NOTE — Telephone Encounter (Signed)
E sent to pharmacy 

## 2014-09-22 ENCOUNTER — Ambulatory Visit (HOSPITAL_COMMUNITY)
Admission: RE | Admit: 2014-09-22 | Discharge: 2014-09-22 | Disposition: A | Discharge status: Home / Self Care | Payer: Medicare Other | Source: Ambulatory Visit | Attending: Vascular Surgery | Admitting: Vascular Surgery

## 2014-09-22 ENCOUNTER — Other Ambulatory Visit: Payer: Self-pay

## 2014-09-22 ENCOUNTER — Other Ambulatory Visit (HOSPITAL_COMMUNITY): Payer: Self-pay | Admitting: Internal Medicine

## 2014-09-22 DIAGNOSIS — M79604 Pain in right leg: Secondary | ICD-10-CM

## 2014-09-22 DIAGNOSIS — R600 Localized edema: Secondary | ICD-10-CM | POA: Insufficient documentation

## 2014-09-30 ENCOUNTER — Encounter (HOSPITAL_COMMUNITY): Payer: Self-pay | Admitting: Cardiovascular Disease

## 2014-09-30 ENCOUNTER — Ambulatory Visit: Payer: Medicare Other | Admitting: Podiatry

## 2014-10-07 ENCOUNTER — Ambulatory Visit: Payer: Medicare Other | Admitting: Podiatry

## 2014-10-07 ENCOUNTER — Ambulatory Visit (INDEPENDENT_AMBULATORY_CARE_PROVIDER_SITE_OTHER): Payer: Medicare Other | Admitting: Podiatry

## 2014-10-07 DIAGNOSIS — M79673 Pain in unspecified foot: Secondary | ICD-10-CM

## 2014-10-07 DIAGNOSIS — B351 Tinea unguium: Secondary | ICD-10-CM

## 2014-10-07 NOTE — Progress Notes (Signed)
He presents today with a chief complaint of painful elongated toenails one through 5 bilateral.  Objective: Pulses are palpable nails are thick yellow dystrophic with mycotic and painful palpation.  Assessment: Pain in limb secondary onychomycosis.  Plan: Debridement of nails 1 through 5 bilateral covered service secondary to pain.

## 2014-10-27 ENCOUNTER — Other Ambulatory Visit: Payer: Self-pay

## 2014-11-17 ENCOUNTER — Encounter: Payer: Self-pay | Admitting: Physician Assistant

## 2014-11-17 ENCOUNTER — Ambulatory Visit (INDEPENDENT_AMBULATORY_CARE_PROVIDER_SITE_OTHER): Payer: Medicare Other | Admitting: Physician Assistant

## 2014-11-17 VITALS — BP 110/80 | HR 59 | Ht 69.5 in | Wt 198.6 lb

## 2014-11-17 DIAGNOSIS — I447 Left bundle-branch block, unspecified: Secondary | ICD-10-CM

## 2014-11-17 DIAGNOSIS — I2583 Coronary atherosclerosis due to lipid rich plaque: Secondary | ICD-10-CM

## 2014-11-17 DIAGNOSIS — I739 Peripheral vascular disease, unspecified: Secondary | ICD-10-CM

## 2014-11-17 DIAGNOSIS — E785 Hyperlipidemia, unspecified: Secondary | ICD-10-CM

## 2014-11-17 DIAGNOSIS — I2510 Atherosclerotic heart disease of native coronary artery without angina pectoris: Secondary | ICD-10-CM

## 2014-11-17 DIAGNOSIS — I10 Essential (primary) hypertension: Secondary | ICD-10-CM

## 2014-11-17 NOTE — Assessment & Plan Note (Signed)
Patient is scheduled for carotid Dopplers on February 11 and subsequent follow-up with Dr. Oneida Alar on the 17th.

## 2014-11-17 NOTE — Progress Notes (Signed)
Patient ID: Henry Neal, male   DOB: [birth year], 81 y.o.   MRN: [id number]    Date:  11/17/2014   ID:  Henry Neal, DOB November 26, 1926, MRN [id number]  PCP:  Henry Seashore, MD  Primary Cardiologist:  Henry Neal   Chief Complaint  Patient presents with  . Follow-up    6 mo-JB,      History of Present Illness: Henry Neal is a 81 y.o. male widowed Caucasian male father of 2, grandfather of one grandchild patient, of Dr. Sallyanne Neal, Henry Neal and [place]. He has a history of carotid artery disease underwent elective right carotid endarterectomy by Dr. Oneida Neal in early January 2015. He also has had an endoluminal stent graft placed by Dr. Oneida Neal of May 2000 and for abdominal aortic aneurysm. His cardiovascular risk factors are positive for hypertension and hyperlipidemia. He has never had a heart attack or stroke. He had a Myoview stress test that showed anteroapical/septal scar with moderate peri-infarct ischemia. Based on this he underwent diagnostic coronary arteriography on 10/10/13 revealing three-vessel disease with an occluded LAD in the midportion, an occluded circumflex marginal branch and moderate RCA disease with preserved LV function. Because he was relatively asymptomatic Dr. Gwenlyn Neal elected to treat him medically.   The patient presents for 6 month eval.  He is scheduled for carotid dopplers on 2/11 and FU with Dr. Oneida Neal on the 17th.  He does have a rash on his back and plans to see his dermatologist soon.  He's been having thoracic spine issues and gets pain between his shoulder blades with radiation to his arms when he rotates. Right LEE is chronic.  He currently denies nausea, vomiting, fever, chest pain, shortness of breath, orthopnea, dizziness, PND, cough, congestion, abdominal pain, hematochezia, melena, claudication.  He does report also that he's been having issues when going into bright lights particularly outside. His vision gets blurry for about 45 minutes. Apparently was  evaluated by his ophthalmologist who didn't think it was related to his eyes but may be vascular problem.  Wt Readings from Last 3 Encounters:  11/17/14 198 lb 9.6 oz (90.084 kg)  05/20/14 200 lb (90.719 kg)  05/18/14 202 lb (91.627 kg)     Past Medical History  Diagnosis Date  . Palpitation   . AAA (abdominal aortic aneurysm)   . Arthritis   . Hiatal hernia   . Dizziness     takes Antivert daily as needed  . ED (erectile dysfunction)   . Sigmoid diverticulosis   . Thyroid disease   . T12 compression fracture   . Allergic rhinitis     uses Flonase daily  . Chronic venous insufficiency   . Cancer     skin  . Carotid artery occlusion   . Abnormal stress test   . Hypertension     takes Proscar,Dyazide,Avapro,and Metoprolol daily  . GERD (gastroesophageal reflux disease)     takes Omeprazole daily  . Coronary artery disease   . Anginal pain     pain in neck,shoulders,down arms occ  . Shortness of breath   . Hx of echocardiogram 04/04/2010    showed a borderline dilatd left ventricle with mild left ventricle hypertrophy and an EF 50-55% doppler suggested diastolic dysfunction, although the pattern is probably normal for an 81 yrs old. The left atrium was indeed mildly dilated at 42 mm, but there are no significant valvular abnormalities.   . Hyperlipidemia     Current Outpatient Prescriptions  Medication Sig Dispense Refill  . aspirin EC  81 MG tablet Take 81 mg by mouth daily.      Marland Kitchen atorvastatin (LIPITOR) 40 MG tablet Take 1 tablet (40 mg total) by mouth daily at 6 PM. 30 tablet 2  . Calcium Carbonate-Vitamin D (CALCIUM 600 + D PO) Take 1 tablet by mouth 2 (two) times daily.     . carvedilol (COREG) 6.25 MG tablet Take 1 tablet (6.25 mg total) by mouth 2 (two) times daily with a meal. 60 tablet 2  . cyclobenzaprine (FLEXERIL) 10 MG tablet Take 10 mg by mouth 3 (three) times daily as needed for muscle spasms.     . diclofenac (VOLTAREN) 50 MG EC tablet Take 50 mg by mouth 2  (two) times daily.     . finasteride (PROSCAR) 5 MG tablet Take 5 mg by mouth daily.      . fluticasone (FLONASE) 50 MCG/ACT nasal spray Place 1 spray into both nostrils daily as needed for allergies.     Marland Kitchen irbesartan (AVAPRO) 150 MG tablet Take 150 mg by mouth every morning.     . isosorbide mononitrate (IMDUR) 30 MG 24 hr tablet Take 1 tablet (30 mg total) by mouth daily. 30 tablet 2  . meclizine (ANTIVERT) 25 MG tablet Take 25 mg by mouth 2 (two) times daily as needed for dizziness.     . mometasone (ELOCON) 0.1 % cream Apply 1 application topically daily as needed (for irritation).     . Multiple Vitamin (MULTIVITAMIN PO) Take by mouth. ALIVE men's vitamin    . nitroGLYCERIN (NITROSTAT) 0.4 MG SL tablet Place 1 tablet (0.4 mg total) under the tongue every 5 (five) minutes as needed for chest pain. 25 tablet 2  . omeprazole (PRILOSEC) 20 MG capsule Take 20 mg by mouth daily.      Marland Kitchen triamterene-hydrochlorothiazide (MAXZIDE-25) 37.5-25 MG per tablet take 1 tablet by mouth once daily 90 tablet 3  . gabapentin (NEURONTIN) 300 MG capsule Take 300 mg by mouth 2 (two) times daily.  0   No current facility-administered medications for this visit.    Allergies:    Allergies  Allergen Reactions  . Sinequan [Doxepin Hcl] Other (See Comments)    hallucinations  . Verapamil Other (See Comments)    Caused heart to race  . Procaine Hcl Other (See Comments)    Tingling all over as soon as it was injected    Social History:  The patient  reports that he quit smoking about 37 years ago. His smoking use included Cigarettes. He has a 60 pack-year smoking history. He quit smokeless tobacco use about 30 years ago. He reports that he drinks about 4.2 oz of alcohol per week. He reports that he does not use illicit drugs.   Family history:   Family History  Problem Relation Age of Onset  . Heart disease Mother     before age 77  . COPD Mother   . Diabetes Mother   . Hyperlipidemia Mother   .  Hypertension Mother     ROS:  Please see the history of present illness.  All other systems reviewed and negative.   PHYSICAL EXAM: VS:  BP 110/80 mmHg  Pulse 59  Ht 5' 9.5" (1.765 m)  Wt 198 lb 9.6 oz (90.084 kg)  BMI 28.92 kg/m2 Well nourished, well developed, in no acute distress HEENT: Pupils are equal round react to light accommodation extraocular movements are intact.  Neck: no JVDNo cervical lymphadenopathy. Mild right carotid bruit Cardiac: Regular rate and rhythm without  murmurs rubs or gallops. Lungs:  clear to auscultation bilaterally, no wheezing, rhonchi or rales Abd: soft, nontender, positive bowel sounds all quadrants, no hepatosplenomegaly Ext: 1+ right lower extremity edema.  2+ radial and dorsalis pedis pulses. Skin: warm and dry Neuro:  Grossly normal  EKG:  Sinus brady, 59.  LBBB    ASSESSMENT AND PLAN:  Problem List Items Addressed This Visit    PAD (peripheral artery disease) - Primary    Patient is scheduled for carotid Dopplers on February 11 and subsequent follow-up with Dr. Oneida Neal on the 17th.      LBBB (left bundle branch block)   Hyperlipidemia    Continue statin.        Essential hypertension    Pressures well-controlled.      Coronary atherosclerosis of native coronary artery    No complaints of angina. Is having pain between shoulder blades which is related to thoracic spine.       Other Visit Diagnoses    Coronary artery disease due to lipid rich plaque        Relevant Orders    EKG 12-Lead

## 2014-11-17 NOTE — Assessment & Plan Note (Signed)
Pressures well-controlled.

## 2014-11-17 NOTE — Patient Instructions (Signed)
Follow up with Dr.Berry as directed in 6 months.

## 2014-11-17 NOTE — Assessment & Plan Note (Signed)
No complaints of angina. Is having pain between shoulder blades which is related to thoracic spine.

## 2014-11-17 NOTE — Assessment & Plan Note (Signed)
Continue statin. 

## 2014-11-25 ENCOUNTER — Other Ambulatory Visit (HOSPITAL_COMMUNITY): Payer: Medicare Other

## 2014-11-25 ENCOUNTER — Ambulatory Visit: Payer: Medicare Other | Admitting: Family

## 2014-12-01 ENCOUNTER — Other Ambulatory Visit: Payer: Self-pay | Admitting: *Deleted

## 2014-12-01 DIAGNOSIS — I714 Abdominal aortic aneurysm, without rupture, unspecified: Secondary | ICD-10-CM

## 2014-12-01 DIAGNOSIS — Z95828 Presence of other vascular implants and grafts: Secondary | ICD-10-CM

## 2014-12-02 ENCOUNTER — Ambulatory Visit (HOSPITAL_COMMUNITY)
Admission: RE | Admit: 2014-12-02 | Discharge: 2014-12-02 | Disposition: A | Discharge status: Home / Self Care | Payer: Medicare Other | Source: Ambulatory Visit | Attending: Vascular Surgery | Admitting: Vascular Surgery

## 2014-12-02 ENCOUNTER — Ambulatory Visit: Payer: Medicare Other | Admitting: Vascular Surgery

## 2014-12-02 ENCOUNTER — Ambulatory Visit (INDEPENDENT_AMBULATORY_CARE_PROVIDER_SITE_OTHER)
Admission: RE | Admit: 2014-12-02 | Discharge: 2014-12-02 | Disposition: A | Discharge status: Home / Self Care | Payer: Medicare Other | Source: Ambulatory Visit | Attending: Vascular Surgery | Admitting: Vascular Surgery

## 2014-12-02 ENCOUNTER — Ambulatory Visit: Payer: Medicare Other | Admitting: Family

## 2014-12-02 DIAGNOSIS — I714 Abdominal aortic aneurysm, without rupture, unspecified: Secondary | ICD-10-CM

## 2014-12-02 DIAGNOSIS — Z95828 Presence of other vascular implants and grafts: Secondary | ICD-10-CM | POA: Diagnosis not present

## 2014-12-02 DIAGNOSIS — I6523 Occlusion and stenosis of bilateral carotid arteries: Secondary | ICD-10-CM

## 2014-12-07 ENCOUNTER — Encounter: Payer: Self-pay | Admitting: Vascular Surgery

## 2014-12-08 ENCOUNTER — Ambulatory Visit (INDEPENDENT_AMBULATORY_CARE_PROVIDER_SITE_OTHER): Payer: Medicare Other | Admitting: Vascular Surgery

## 2014-12-08 ENCOUNTER — Encounter: Payer: Self-pay | Admitting: Vascular Surgery

## 2014-12-08 VITALS — BP 121/53 | HR 62 | Resp 14 | Ht 70.0 in | Wt 202.0 lb

## 2014-12-08 DIAGNOSIS — I714 Abdominal aortic aneurysm, without rupture, unspecified: Secondary | ICD-10-CM

## 2014-12-08 DIAGNOSIS — Z48812 Encounter for surgical aftercare following surgery on the circulatory system: Secondary | ICD-10-CM

## 2014-12-08 DIAGNOSIS — I6521 Occlusion and stenosis of right carotid artery: Secondary | ICD-10-CM

## 2014-12-08 DIAGNOSIS — I83893 Varicose veins of bilateral lower extremities with other complications: Secondary | ICD-10-CM

## 2014-12-08 NOTE — Addendum Note (Signed)
Addended by: Dorthula Rue L on: 12/08/2014 03:21 PM   Modules accepted: Orders

## 2014-12-08 NOTE — Progress Notes (Signed)
Patient is an 81 year old male who returns today for followup after right carotid endarterectomy. This was done in January 2015. He reports no episodes of TIA amaurosis stroke weakness or numbness in his extremities. He does have occasional bilateral numbness and tingling in his hands which radiates from bilateral shoulder pain. The shoulder pt was evaluated in the hospital for possible cardiac source origin in the workup overall was negative. The patient is followed by Dr. Gwenlyn Found. He also previously had aneurysm stent graft repair in 2010. Continue to follow him for this as well. Patient also complains of bilateral lower extremity swelling. He does wear 20-30 mmHg compression stockings which are thigh-high. He states he is compliant with these. He has been wearing these for several months. He does get some relief while wearing these but states that the veins are still a nuisance problem for him.  Physical exam:     Filed Vitals:   12/08/14 0956 12/08/14 0958  BP: 144/61 121/53  Pulse: 68 62  Resp: 14   Height: 5\' 10"  (1.778 m)   Weight: 202 lb (91.627 kg)    Neck: Well-healed neck incision  Neuro: Symmetric upper extremity and lower extremity motor strength 5 over 5 Abdomen: Soft nontender nondistended no pulsatile mass 2+ femoral pulses 2+ posterior tibial pulses Extremities: Multiple clusters of varicosities right worse than left vein diameter 4-6 mm clusters primarily right posterior medial calf bilaterally with areas of spider and reticular veins bilaterally near the ankle some brawny staining of the gaiter area no ulceration  Data: Aortic ultrasound aneurysm diameter 3.4 cm no evidence of type I or type II endoleak on ultrasound today. Study was performed on February 11  Patient also had a carotid duplex ultrasound at that time which showed no significant recurrent stenosis on the right side and less than 40% stenosis on the left side.   Assessment: Doing well status post right carotid  endarterectomy.  Has chronic complaints of bilateral shoulder/neck problems overall stable.  Abdominal aortic stent graft in good position with no evidence of endoleak aneurysm diameter 3.4 cm. Bilateral symptomatic varicose veins   Plan: The patient will follow-up with [place] in one year for a carotid duplex exam as well as an aneurysm ultrasound. We will also see how he does over the next year with compression stockings for treating his varicose veins. If his symptoms are not relieved by this we will consider laser ablation. He will have a venous reflux duplex exam at his next evaluation as well.  Ruta Hinds, MD [facility] Office: [phone] Pager: [phone]

## 2015-01-06 ENCOUNTER — Ambulatory Visit: Payer: Medicare Other

## 2015-01-20 ENCOUNTER — Ambulatory Visit: Payer: Self-pay

## 2015-01-27 ENCOUNTER — Ambulatory Visit (INDEPENDENT_AMBULATORY_CARE_PROVIDER_SITE_OTHER): Payer: Medicare Other | Admitting: Podiatry

## 2015-01-27 DIAGNOSIS — M79673 Pain in unspecified foot: Secondary | ICD-10-CM

## 2015-01-27 DIAGNOSIS — B351 Tinea unguium: Secondary | ICD-10-CM | POA: Diagnosis not present

## 2015-01-27 NOTE — Progress Notes (Signed)
He presents today with a chief complaint of painful elongated toenails one through 5 bilateral.  Objective: Pulses are palpable nails are thick yellow dystrophic with mycotic and painful palpation.  Assessment: Pain in limb secondary onychomycosis.  Plan: Debridement of nails 1 through 5 bilateral covered service secondary to pain.  Note: He is going to make an immediate follow-up appointment for foot pain.

## 2015-02-17 ENCOUNTER — Ambulatory Visit (INDEPENDENT_AMBULATORY_CARE_PROVIDER_SITE_OTHER): Payer: Medicare Other

## 2015-02-17 ENCOUNTER — Ambulatory Visit (INDEPENDENT_AMBULATORY_CARE_PROVIDER_SITE_OTHER): Payer: Medicare Other | Admitting: Podiatry

## 2015-02-17 DIAGNOSIS — M779 Enthesopathy, unspecified: Secondary | ICD-10-CM

## 2015-02-17 DIAGNOSIS — M79672 Pain in left foot: Secondary | ICD-10-CM

## 2015-02-17 DIAGNOSIS — M79671 Pain in right foot: Secondary | ICD-10-CM | POA: Diagnosis not present

## 2015-02-17 NOTE — Progress Notes (Signed)
   Subjective:    Patient ID: Henry Neal, male    DOB: 1927-06-30, 81 y.o.   MRN: [id number]  HPI Pt presents with bilateral foot pain, left foot dorsal side, right foot general pain and swelling   Review of Systems  Cardiovascular: Positive for leg swelling.  All other systems reviewed and are negative.      Objective:   Physical Exam: I have reviewed his past history medications allergy surgery social history and review of systems. Pulses are strongly palpable bilaterally. Neurologic sensorium is intact versus lasting monofilament deep tendon reflexes are intact bilateral muscle strength +5 over 5 dorsiflexion plantar flexion and inversion everters all intrinsic musculature is intact. Orthopedic evaluation demonstrates all joints distal to the ankle range of motion without crepitation. He does however have pain on palpation of the talonavicular joint of the left foot and pain on end range of motion of the subtalar joint bilaterally. Cutaneous evaluation was a supple well-hydrated cutis no erythema edema saline as drainage or other.        Assessment & Plan:  Assessment: Subtalar joint capsulitis and osteoarthritis bilateral.  Plan: Injected today with Kenalog and local anesthetic after sterile Betadine skin prep bilateral subtalar joint. The right subtalar joint was hard to access due to arthritis. Follow-up with him in 4-6 weeks

## 2015-03-01 ENCOUNTER — Other Ambulatory Visit: Payer: Self-pay | Admitting: Podiatry

## 2015-03-01 ENCOUNTER — Encounter (HOSPITAL_COMMUNITY)
Admission: RE | Admit: 2015-03-01 | Discharge: 2015-03-01 | Disposition: A | Discharge status: Home / Self Care | Payer: Self-pay | Source: Ambulatory Visit | Attending: Cardiovascular Disease | Admitting: Cardiovascular Disease

## 2015-03-01 DIAGNOSIS — Z48812 Encounter for surgical aftercare following surgery on the circulatory system: Secondary | ICD-10-CM | POA: Insufficient documentation

## 2015-03-01 DIAGNOSIS — I739 Peripheral vascular disease, unspecified: Secondary | ICD-10-CM | POA: Insufficient documentation

## 2015-03-01 DIAGNOSIS — I714 Abdominal aortic aneurysm, without rupture: Secondary | ICD-10-CM | POA: Insufficient documentation

## 2015-03-01 DIAGNOSIS — I119 Hypertensive heart disease without heart failure: Secondary | ICD-10-CM | POA: Insufficient documentation

## 2015-03-01 DIAGNOSIS — E785 Hyperlipidemia, unspecified: Secondary | ICD-10-CM | POA: Insufficient documentation

## 2015-03-01 DIAGNOSIS — Z95828 Presence of other vascular implants and grafts: Secondary | ICD-10-CM | POA: Insufficient documentation

## 2015-03-02 ENCOUNTER — Other Ambulatory Visit: Payer: Self-pay | Admitting: Orthopaedic Surgery

## 2015-03-02 DIAGNOSIS — M542 Cervicalgia: Secondary | ICD-10-CM

## 2015-03-03 ENCOUNTER — Encounter (HOSPITAL_COMMUNITY): Payer: Self-pay

## 2015-03-04 ENCOUNTER — Encounter (HOSPITAL_COMMUNITY): Payer: Self-pay

## 2015-03-08 ENCOUNTER — Encounter (HOSPITAL_COMMUNITY): Payer: Self-pay

## 2015-03-10 ENCOUNTER — Encounter (HOSPITAL_COMMUNITY): Payer: Self-pay

## 2015-03-11 ENCOUNTER — Encounter (HOSPITAL_COMMUNITY): Payer: Self-pay

## 2015-03-15 ENCOUNTER — Encounter (HOSPITAL_COMMUNITY): Payer: Self-pay

## 2015-03-17 ENCOUNTER — Encounter (HOSPITAL_COMMUNITY): Payer: Self-pay

## 2015-03-18 ENCOUNTER — Encounter (HOSPITAL_COMMUNITY): Payer: Self-pay

## 2015-03-22 ENCOUNTER — Ambulatory Visit
Admission: RE | Admit: 2015-03-22 | Discharge: 2015-03-22 | Disposition: A | Discharge status: Home / Self Care | Payer: Medicare Other | Source: Ambulatory Visit | Attending: Orthopaedic Surgery | Admitting: Orthopaedic Surgery

## 2015-03-22 ENCOUNTER — Encounter (HOSPITAL_COMMUNITY): Payer: Self-pay

## 2015-03-22 DIAGNOSIS — M542 Cervicalgia: Secondary | ICD-10-CM

## 2015-03-24 ENCOUNTER — Encounter (HOSPITAL_COMMUNITY): Payer: Self-pay

## 2015-03-24 DIAGNOSIS — I739 Peripheral vascular disease, unspecified: Secondary | ICD-10-CM | POA: Insufficient documentation

## 2015-03-24 DIAGNOSIS — I714 Abdominal aortic aneurysm, without rupture: Secondary | ICD-10-CM | POA: Insufficient documentation

## 2015-03-24 DIAGNOSIS — Z48812 Encounter for surgical aftercare following surgery on the circulatory system: Secondary | ICD-10-CM | POA: Insufficient documentation

## 2015-03-24 DIAGNOSIS — Z95828 Presence of other vascular implants and grafts: Secondary | ICD-10-CM | POA: Insufficient documentation

## 2015-03-24 DIAGNOSIS — E785 Hyperlipidemia, unspecified: Secondary | ICD-10-CM | POA: Insufficient documentation

## 2015-03-24 DIAGNOSIS — I119 Hypertensive heart disease without heart failure: Secondary | ICD-10-CM | POA: Insufficient documentation

## 2015-03-25 ENCOUNTER — Encounter (HOSPITAL_COMMUNITY): Payer: Medicare Other

## 2015-03-29 ENCOUNTER — Encounter (HOSPITAL_COMMUNITY): Payer: Self-pay

## 2015-03-31 ENCOUNTER — Encounter (HOSPITAL_COMMUNITY): Payer: Self-pay

## 2015-04-01 ENCOUNTER — Encounter (HOSPITAL_COMMUNITY): Payer: Self-pay

## 2015-04-05 ENCOUNTER — Encounter (HOSPITAL_COMMUNITY): Payer: Self-pay

## 2015-04-07 ENCOUNTER — Encounter (HOSPITAL_COMMUNITY): Payer: Self-pay

## 2015-04-08 ENCOUNTER — Encounter (HOSPITAL_COMMUNITY): Payer: Self-pay

## 2015-04-12 ENCOUNTER — Encounter (HOSPITAL_COMMUNITY)
Admission: RE | Admit: 2015-04-12 | Discharge: 2015-04-12 | Disposition: A | Discharge status: Home / Self Care | Payer: Self-pay | Source: Ambulatory Visit | Attending: Cardiovascular Disease | Admitting: Cardiovascular Disease

## 2015-04-14 ENCOUNTER — Encounter (HOSPITAL_COMMUNITY)
Admission: RE | Admit: 2015-04-14 | Discharge: 2015-04-14 | Disposition: A | Discharge status: Home / Self Care | Payer: Self-pay | Source: Ambulatory Visit | Attending: Cardiovascular Disease | Admitting: Cardiovascular Disease

## 2015-04-15 ENCOUNTER — Encounter (HOSPITAL_COMMUNITY)
Admission: RE | Admit: 2015-04-15 | Discharge: 2015-04-15 | Disposition: A | Discharge status: Home / Self Care | Payer: Self-pay | Source: Ambulatory Visit | Attending: Cardiovascular Disease | Admitting: Cardiovascular Disease

## 2015-04-19 ENCOUNTER — Encounter (HOSPITAL_COMMUNITY)
Admission: RE | Admit: 2015-04-19 | Discharge: 2015-04-19 | Disposition: A | Discharge status: Home / Self Care | Payer: Self-pay | Source: Ambulatory Visit | Attending: Cardiovascular Disease | Admitting: Cardiovascular Disease

## 2015-04-21 ENCOUNTER — Encounter (HOSPITAL_COMMUNITY)
Admission: RE | Admit: 2015-04-21 | Discharge: 2015-04-21 | Disposition: A | Discharge status: Home / Self Care | Payer: Self-pay | Source: Ambulatory Visit | Attending: Cardiovascular Disease | Admitting: Cardiovascular Disease

## 2015-04-22 ENCOUNTER — Encounter (HOSPITAL_COMMUNITY)
Admission: RE | Admit: 2015-04-22 | Discharge: 2015-04-22 | Disposition: A | Discharge status: Home / Self Care | Payer: Self-pay | Source: Ambulatory Visit | Attending: Cardiovascular Disease | Admitting: Cardiovascular Disease

## 2015-04-22 DIAGNOSIS — E785 Hyperlipidemia, unspecified: Secondary | ICD-10-CM | POA: Insufficient documentation

## 2015-04-22 DIAGNOSIS — I714 Abdominal aortic aneurysm, without rupture: Secondary | ICD-10-CM | POA: Insufficient documentation

## 2015-04-22 DIAGNOSIS — I739 Peripheral vascular disease, unspecified: Secondary | ICD-10-CM | POA: Insufficient documentation

## 2015-04-22 DIAGNOSIS — Z48812 Encounter for surgical aftercare following surgery on the circulatory system: Secondary | ICD-10-CM | POA: Insufficient documentation

## 2015-04-22 DIAGNOSIS — Z95828 Presence of other vascular implants and grafts: Secondary | ICD-10-CM | POA: Insufficient documentation

## 2015-04-22 DIAGNOSIS — I119 Hypertensive heart disease without heart failure: Secondary | ICD-10-CM | POA: Insufficient documentation

## 2015-04-26 ENCOUNTER — Encounter (HOSPITAL_COMMUNITY)
Admission: RE | Admit: 2015-04-26 | Discharge: 2015-04-26 | Disposition: A | Discharge status: Home / Self Care | Payer: Self-pay | Source: Ambulatory Visit | Attending: Cardiovascular Disease | Admitting: Cardiovascular Disease

## 2015-04-28 ENCOUNTER — Encounter (HOSPITAL_COMMUNITY): Payer: Self-pay

## 2015-04-28 ENCOUNTER — Encounter: Payer: Self-pay | Admitting: Podiatry

## 2015-04-28 ENCOUNTER — Ambulatory Visit (INDEPENDENT_AMBULATORY_CARE_PROVIDER_SITE_OTHER): Payer: Medicare Other | Admitting: Podiatry

## 2015-04-28 DIAGNOSIS — B351 Tinea unguium: Secondary | ICD-10-CM

## 2015-04-28 DIAGNOSIS — M79676 Pain in unspecified toe(s): Secondary | ICD-10-CM | POA: Diagnosis not present

## 2015-04-28 DIAGNOSIS — I739 Peripheral vascular disease, unspecified: Secondary | ICD-10-CM

## 2015-04-28 NOTE — Progress Notes (Signed)
Patient ID: Henry Neal, male   DOB: 09/24/1927, 81 y.o.   MRN: [id number] Complaint:  Visit Type: Patient returns to my office for continued preventative foot care services. Complaint: Patient states" my nails have grown long and thick and become painful to walk and wear shoes"  He presents for preventative foot care services. No changes to ROS  Podiatric Exam: Vascular: dorsalis pedis and posterior tibial pulses are palpable bilateral. Capillary return is immediate. Temperature gradient is WNL. Skin turgor WNL  Sensorium: Normal Semmes Weinstein monofilament test. Normal tactile sensation bilaterally. Nail Exam: Pt has thick disfigured discolored nails with subungual debris noted bilateral entire nail hallux through fifth toenails Ulcer Exam: There is no evidence of ulcer or pre-ulcerative changes or infection. Orthopedic Exam: Muscle tone and strength are WNL. No limitations in general ROM. No crepitus or effusions noted. Foot type and digits show no abnormalities. Bony prominences are unremarkable. Skin: No Porokeratosis. No infection or ulcers. Distal Clavi second toe right.  Diagnosis:  Tinea unguium, Pain in right toe, pain in left toes  Treatment & Plan Procedures and Treatment: Consent by patient was obtained for treatment procedures. The patient understood the discussion of treatment and procedures well. All questions were answered thoroughly reviewed. Debridement of mycotic and hypertrophic toenails, 1 through 5 bilateral and clearing of subungual debris. No ulceration, no infection noted.  Return Visit-Office Procedure: Patient instructed to return to the office for a follow up visit 3 months for continued evaluation and treatment.

## 2015-04-29 ENCOUNTER — Encounter (HOSPITAL_COMMUNITY)
Admission: RE | Admit: 2015-04-29 | Discharge: 2015-04-29 | Disposition: A | Discharge status: Home / Self Care | Payer: Self-pay | Source: Ambulatory Visit | Attending: Cardiovascular Disease | Admitting: Cardiovascular Disease

## 2015-05-03 ENCOUNTER — Encounter (HOSPITAL_COMMUNITY)
Admission: RE | Admit: 2015-05-03 | Discharge: 2015-05-03 | Disposition: A | Discharge status: Home / Self Care | Payer: Self-pay | Source: Ambulatory Visit | Attending: Cardiovascular Disease | Admitting: Cardiovascular Disease

## 2015-05-05 ENCOUNTER — Encounter (HOSPITAL_COMMUNITY)
Admission: RE | Admit: 2015-05-05 | Discharge: 2015-05-05 | Disposition: A | Discharge status: Home / Self Care | Payer: Self-pay | Source: Ambulatory Visit | Attending: Cardiovascular Disease | Admitting: Cardiovascular Disease

## 2015-05-06 ENCOUNTER — Encounter (HOSPITAL_COMMUNITY)
Admission: RE | Admit: 2015-05-06 | Discharge: 2015-05-06 | Disposition: A | Discharge status: Home / Self Care | Payer: Self-pay | Source: Ambulatory Visit | Attending: Cardiovascular Disease | Admitting: Cardiovascular Disease

## 2015-05-10 ENCOUNTER — Encounter (HOSPITAL_COMMUNITY)
Admission: RE | Admit: 2015-05-10 | Discharge: 2015-05-10 | Disposition: A | Discharge status: Home / Self Care | Payer: Self-pay | Source: Ambulatory Visit | Attending: Cardiovascular Disease | Admitting: Cardiovascular Disease

## 2015-05-12 ENCOUNTER — Encounter (HOSPITAL_COMMUNITY)
Admission: RE | Admit: 2015-05-12 | Discharge: 2015-05-12 | Disposition: A | Discharge status: Home / Self Care | Payer: Self-pay | Source: Ambulatory Visit | Attending: Cardiovascular Disease | Admitting: Cardiovascular Disease

## 2015-05-13 ENCOUNTER — Encounter (HOSPITAL_COMMUNITY)
Admission: RE | Admit: 2015-05-13 | Discharge: 2015-05-13 | Disposition: A | Discharge status: Home / Self Care | Payer: Self-pay | Source: Ambulatory Visit | Attending: Cardiovascular Disease | Admitting: Cardiovascular Disease

## 2015-05-17 ENCOUNTER — Encounter (HOSPITAL_COMMUNITY)
Admission: RE | Admit: 2015-05-17 | Discharge: 2015-05-17 | Disposition: A | Discharge status: Home / Self Care | Payer: Self-pay | Source: Ambulatory Visit | Attending: Cardiovascular Disease | Admitting: Cardiovascular Disease

## 2015-05-18 ENCOUNTER — Ambulatory Visit: Payer: Medicare Other | Admitting: Cardiovascular Disease

## 2015-05-18 ENCOUNTER — Ambulatory Visit (INDEPENDENT_AMBULATORY_CARE_PROVIDER_SITE_OTHER): Payer: Medicare Other | Admitting: Cardiovascular Disease

## 2015-05-18 ENCOUNTER — Encounter: Payer: Self-pay | Admitting: Cardiovascular Disease

## 2015-05-18 VITALS — BP 134/70 | HR 60 | Ht 70.0 in | Wt 193.5 lb

## 2015-05-18 DIAGNOSIS — I447 Left bundle-branch block, unspecified: Secondary | ICD-10-CM

## 2015-05-18 DIAGNOSIS — I739 Peripheral vascular disease, unspecified: Secondary | ICD-10-CM | POA: Diagnosis not present

## 2015-05-18 DIAGNOSIS — I10 Essential (primary) hypertension: Secondary | ICD-10-CM | POA: Diagnosis not present

## 2015-05-18 DIAGNOSIS — I2510 Atherosclerotic heart disease of native coronary artery without angina pectoris: Secondary | ICD-10-CM | POA: Diagnosis not present

## 2015-05-18 DIAGNOSIS — E785 Hyperlipidemia, unspecified: Secondary | ICD-10-CM | POA: Diagnosis not present

## 2015-05-18 NOTE — Assessment & Plan Note (Signed)
History of PAD status post endoluminal stent grafting by Dr. Oneida Alar May 2000 for an abdominal aortic aneurysm. He also underwent elective right carotid endarterectomy January 2015.

## 2015-05-18 NOTE — Assessment & Plan Note (Signed)
Chronic left bundle branch block. 

## 2015-05-18 NOTE — Assessment & Plan Note (Signed)
History of hyperlipidemia on Lipitor statin 40 mg a day followed by his PCP

## 2015-05-18 NOTE — Patient Instructions (Signed)
Your physician wants you to follow-up in: 1 year with Dr Berry. You will receive a reminder letter in the mail two months in advance. If you don't receive a letter, please call our office to schedule the follow-up appointment.  

## 2015-05-18 NOTE — Progress Notes (Signed)
05/18/2015 Henry Neal   1927-02-06  [id number]  Primary Physician Merrilee Seashore, MD Primary Cardiologist: Lorretta Harp MD Renae Gloss   HPI:  Henry Neal is a delightful 81 year old widowed Caucasian male father of 2, grandfather of one grandchild patient, of Dr. Sallyanne Kuster, Oneida Alar and [place]. I last saw him in the office 05/18/14.He has a history of carotid artery disease and he was scheduled to undergo elective right carotid endarterectomy by Dr. Oneida Alar in early January. He also has had an endoluminal stent graft placed by Dr. Oneida Alar of May 2000 and for abdominal aortic aneurysm. His cardiovascular risk factors are positive for hypertension and hyperlipidemia. He has never had a heart attack or stroke. He denies chest pain or shortness of breath. He Myoview stress test from yesterday that showed anteroapical/septal scar with moderate peri-infarct ischemia. Based on this he underwent diagnostic coronary arteriography on 10/10/14 revealing three-vessel disease with an occluded LAD in the midportion, an occluded circumflex marginal branch and moderate RCA disease with preserved LV function. Because he was relatively asymptomatic I elected to treat him medically. He underwent elective right carotid endarterectomy on 10/30/13 and had an uncomplicated postoperative course. He was seen postop for her jaw in neck and arm pain and his medicines were adjusted by Dr. Haroldine Laws . Since I saw him a year ago he's remained clinically stable specifically denying chest pain. He does have some mild dyspnea. He still works as a Glass blower/designer at age 64. His lipid profile followed by his PCP.   Current Outpatient Prescriptions  Medication Sig Dispense Refill  . aspirin EC 81 MG tablet Take 81 mg by mouth daily.      Marland Kitchen atorvastatin (LIPITOR) 40 MG tablet Take 1 tablet (40 mg total) by mouth daily at 6 PM. 30 tablet 2  . Calcium Carbonate-Vitamin D (CALCIUM 600 + D PO) Take 1 tablet by  mouth 2 (two) times daily.     . carvedilol (COREG) 6.25 MG tablet Take 1 tablet (6.25 mg total) by mouth 2 (two) times daily with a meal. 60 tablet 2  . diclofenac (VOLTAREN) 50 MG EC tablet Take 50 mg by mouth 2 (two) times daily.     . finasteride (PROSCAR) 5 MG tablet take 1 tablet by mouth once daily    . fluticasone (FLONASE) 50 MCG/ACT nasal spray Place 1 spray into both nostrils daily as needed for allergies.     Marland Kitchen gabapentin (NEURONTIN) 300 MG capsule Take 300 mg by mouth 2 (two) times daily.  0  . irbesartan (AVAPRO) 150 MG tablet Take 150 mg by mouth every morning.     . isosorbide mononitrate (IMDUR) 30 MG 24 hr tablet Take 1 tablet (30 mg total) by mouth daily. 30 tablet 2  . ketoconazole (NIZORAL) 2 % cream apply to affected area ON FACE ONCE DAILY  0  . meclizine (ANTIVERT) 25 MG tablet Take 25 mg by mouth 2 (two) times daily as needed for dizziness.     . mometasone (ELOCON) 0.1 % cream Apply 1 application topically daily as needed (for irritation).     . Multiple Vitamin (MULTIVITAMIN PO) Take by mouth. ALIVE men's vitamin    . nitroGLYCERIN (NITROSTAT) 0.4 MG SL tablet Place 1 tablet (0.4 mg total) under the tongue every 5 (five) minutes as needed for chest pain. 25 tablet 2  . pantoprazole (PROTONIX) 40 MG tablet Take 40 mg by mouth daily.  0  . triamterene-hydrochlorothiazide (MAXZIDE-25) 37.5-25 MG per tablet take 1  tablet by mouth once daily 90 tablet 3   No current facility-administered medications for this visit.    Allergies  Allergen Reactions  . Sinequan [Doxepin Hcl] Other (See Comments)    hallucinations  . Verapamil Other (See Comments)    Caused heart to race  . Procaine Hcl Other (See Comments)    Tingling all over as soon as it was injected    History   Social History  . Marital Status: Widowed    Spouse Name: N/A  . Number of Children: N/A  . Years of Education: N/A   Occupational History  . Not on file.   Social History Main Topics  .  Smoking status: Former Smoker -- 2.00 packs/day for 30 years    Types: Cigarettes    Quit date: 10/22/1977  . Smokeless tobacco: Former Systems developer    Quit date: 10/22/1984  . Alcohol Use: 4.2 oz/week    7 Cans of beer per week  . Drug Use: No  . Sexual Activity: Not on file   Other Topics Concern  . Not on file   Social History Narrative     Review of Systems: General: negative for chills, fever, night sweats or weight changes.  Cardiovascular: negative for chest pain, dyspnea on exertion, edema, orthopnea, palpitations, paroxysmal nocturnal dyspnea or shortness of breath Dermatological: negative for rash Respiratory: negative for cough or wheezing Urologic: negative for hematuria Abdominal: negative for nausea, vomiting, diarrhea, bright red blood per rectum, melena, or hematemesis Neurologic: negative for visual changes, syncope, or dizziness All other systems reviewed and are otherwise negative except as noted above.    Blood pressure 134/70, pulse 60, height 5\' 10"  (1.778 m), weight 193 lb 8 oz (87.771 kg).  General appearance: alert and no distress Neck: no adenopathy, no carotid bruit, no JVD, supple, symmetrical, trachea midline and thyroid not enlarged, symmetric, no tenderness/mass/nodules Lungs: clear to auscultation bilaterally Heart: regular rate and rhythm, S1, S2 normal, no murmur, click, rub or gallop Extremities: extremities normal, atraumatic, no cyanosis or edema  EKG normal sinus rhythm at 60 with left bundle branch block. I personally reviewed this EKG  ASSESSMENT AND PLAN:   PAD (peripheral artery disease) History of PAD status post endoluminal stent grafting by Dr. Oneida Alar May 2000 for an abdominal aortic aneurysm. He also underwent elective right carotid endarterectomy January 2015.  LBBB (left bundle branch block) Chronic left bundle branch block  Hyperlipidemia History of hyperlipidemia on Lipitor statin 40 mg a day followed by his PCP  Essential  hypertension History of hypertension with blood pressure measured 134/70. He is on Imdur, Avapro, carvedilol and triamterene heart record thiazide. Continue current meds at current dosing  Coronary atherosclerosis of native coronary artery History of coronary artery disease status post cardiac catheterization 10/10/14 revealing three-vessel disease with an occluded LAD in the midportion, an occluded circumflex marginal branch and moderate RCA disease with preserved LV function. I elected to treat him medically. He denies chest pain but does complain of dyspnea on exertion.      Lorretta Harp MD FACP,FACC,FAHA, [facility] 05/18/2015 9:47 AM

## 2015-05-18 NOTE — Assessment & Plan Note (Signed)
History of hypertension with blood pressure measured 134/70. He is on Imdur, Avapro, carvedilol and triamterene heart record thiazide. Continue current meds at current dosing

## 2015-05-18 NOTE — Assessment & Plan Note (Signed)
History of coronary artery disease status post cardiac catheterization 10/10/14 revealing three-vessel disease with an occluded LAD in the midportion, an occluded circumflex marginal branch and moderate RCA disease with preserved LV function. I elected to treat him medically. He denies chest pain but does complain of dyspnea on exertion.

## 2015-05-19 ENCOUNTER — Encounter (HOSPITAL_COMMUNITY)
Admission: RE | Admit: 2015-05-19 | Discharge: 2015-05-19 | Disposition: A | Discharge status: Home / Self Care | Payer: Self-pay | Source: Ambulatory Visit | Attending: Cardiovascular Disease | Admitting: Cardiovascular Disease

## 2015-05-20 ENCOUNTER — Encounter (HOSPITAL_COMMUNITY)
Admission: RE | Admit: 2015-05-20 | Discharge: 2015-05-20 | Disposition: A | Discharge status: Home / Self Care | Payer: Self-pay | Source: Ambulatory Visit | Attending: Cardiovascular Disease | Admitting: Cardiovascular Disease

## 2015-05-24 ENCOUNTER — Encounter (HOSPITAL_COMMUNITY)
Admission: RE | Admit: 2015-05-24 | Discharge: 2015-05-24 | Disposition: A | Discharge status: Home / Self Care | Payer: Self-pay | Source: Ambulatory Visit | Attending: Cardiovascular Disease | Admitting: Cardiovascular Disease

## 2015-05-24 DIAGNOSIS — I119 Hypertensive heart disease without heart failure: Secondary | ICD-10-CM | POA: Insufficient documentation

## 2015-05-24 DIAGNOSIS — E785 Hyperlipidemia, unspecified: Secondary | ICD-10-CM | POA: Insufficient documentation

## 2015-05-24 DIAGNOSIS — I714 Abdominal aortic aneurysm, without rupture: Secondary | ICD-10-CM | POA: Insufficient documentation

## 2015-05-24 DIAGNOSIS — Z95828 Presence of other vascular implants and grafts: Secondary | ICD-10-CM | POA: Insufficient documentation

## 2015-05-24 DIAGNOSIS — I739 Peripheral vascular disease, unspecified: Secondary | ICD-10-CM | POA: Insufficient documentation

## 2015-05-24 DIAGNOSIS — Z48812 Encounter for surgical aftercare following surgery on the circulatory system: Secondary | ICD-10-CM | POA: Insufficient documentation

## 2015-05-26 ENCOUNTER — Encounter (HOSPITAL_COMMUNITY): Payer: Self-pay

## 2015-05-27 ENCOUNTER — Encounter (HOSPITAL_COMMUNITY)
Admission: RE | Admit: 2015-05-27 | Discharge: 2015-05-27 | Disposition: A | Discharge status: Home / Self Care | Payer: Self-pay | Source: Ambulatory Visit | Attending: Cardiovascular Disease | Admitting: Cardiovascular Disease

## 2015-05-31 ENCOUNTER — Encounter (HOSPITAL_COMMUNITY)
Admission: RE | Admit: 2015-05-31 | Discharge: 2015-05-31 | Disposition: A | Discharge status: Home / Self Care | Payer: Self-pay | Source: Ambulatory Visit | Attending: Cardiovascular Disease | Admitting: Cardiovascular Disease

## 2015-06-02 ENCOUNTER — Encounter (HOSPITAL_COMMUNITY): Payer: Medicare Other

## 2015-06-03 ENCOUNTER — Encounter (HOSPITAL_COMMUNITY)
Admission: RE | Admit: 2015-06-03 | Discharge: 2015-06-03 | Disposition: A | Discharge status: Home / Self Care | Payer: Self-pay | Source: Ambulatory Visit | Attending: Cardiovascular Disease | Admitting: Cardiovascular Disease

## 2015-06-07 ENCOUNTER — Encounter (HOSPITAL_COMMUNITY): Payer: Self-pay

## 2015-06-09 ENCOUNTER — Encounter (HOSPITAL_COMMUNITY): Payer: Self-pay

## 2015-06-10 ENCOUNTER — Encounter (HOSPITAL_COMMUNITY): Payer: Self-pay

## 2015-06-14 ENCOUNTER — Encounter (HOSPITAL_COMMUNITY): Payer: Self-pay

## 2015-06-16 ENCOUNTER — Encounter (HOSPITAL_COMMUNITY): Payer: Self-pay

## 2015-06-17 ENCOUNTER — Encounter (HOSPITAL_COMMUNITY): Payer: Self-pay

## 2015-06-19 ENCOUNTER — Other Ambulatory Visit: Payer: Self-pay | Admitting: Cardiology

## 2015-06-20 NOTE — Telephone Encounter (Signed)
REFILL 

## 2015-06-21 ENCOUNTER — Encounter (HOSPITAL_COMMUNITY)
Admission: RE | Admit: 2015-06-21 | Discharge: 2015-06-21 | Disposition: A | Discharge status: Home / Self Care | Payer: Self-pay | Source: Ambulatory Visit | Attending: Cardiovascular Disease | Admitting: Cardiovascular Disease

## 2015-06-21 NOTE — Progress Notes (Signed)
Patient called to discharge from the cardiac rehab maintenance program at this time due to ongoing medical issues that he is having evaluated. Pt hopes to return at a later time when able. Sol Passer, MS, ACSM CCEP

## 2015-06-23 ENCOUNTER — Encounter (HOSPITAL_COMMUNITY): Payer: Self-pay

## 2015-06-24 ENCOUNTER — Encounter (HOSPITAL_COMMUNITY): Payer: Self-pay

## 2015-06-24 ENCOUNTER — Telehealth: Payer: Self-pay | Admitting: Physician Assistant

## 2015-06-24 NOTE — Telephone Encounter (Signed)
Received records from [facility] for appointment on 07/07/15 with Tarri Fuller, PA.  Records given to [employer] (medical records) for Bryan's schedule on 07/07/15. lp

## 2015-06-28 ENCOUNTER — Encounter (HOSPITAL_COMMUNITY): Payer: Self-pay

## 2015-06-30 ENCOUNTER — Encounter (HOSPITAL_COMMUNITY): Payer: Self-pay

## 2015-07-01 ENCOUNTER — Encounter (HOSPITAL_COMMUNITY): Payer: Self-pay

## 2015-07-05 ENCOUNTER — Encounter (HOSPITAL_COMMUNITY): Payer: Self-pay

## 2015-07-07 ENCOUNTER — Ambulatory Visit (INDEPENDENT_AMBULATORY_CARE_PROVIDER_SITE_OTHER): Payer: Medicare Other | Admitting: Physician Assistant

## 2015-07-07 ENCOUNTER — Encounter (HOSPITAL_COMMUNITY): Payer: Self-pay

## 2015-07-07 ENCOUNTER — Encounter: Payer: Self-pay | Admitting: Physician Assistant

## 2015-07-07 VITALS — BP 92/50 | HR 56 | Ht 70.0 in | Wt 195.1 lb

## 2015-07-07 DIAGNOSIS — I739 Peripheral vascular disease, unspecified: Secondary | ICD-10-CM | POA: Diagnosis not present

## 2015-07-07 DIAGNOSIS — R079 Chest pain, unspecified: Secondary | ICD-10-CM

## 2015-07-07 DIAGNOSIS — I10 Essential (primary) hypertension: Secondary | ICD-10-CM

## 2015-07-07 DIAGNOSIS — E785 Hyperlipidemia, unspecified: Secondary | ICD-10-CM

## 2015-07-07 DIAGNOSIS — I6529 Occlusion and stenosis of unspecified carotid artery: Secondary | ICD-10-CM

## 2015-07-07 DIAGNOSIS — I447 Left bundle-branch block, unspecified: Secondary | ICD-10-CM

## 2015-07-07 NOTE — Patient Instructions (Signed)
Your physician wants you to follow-up in: [place] will receive a reminder letter in the mail two months in advance. If you don't receive a letter, please call our office to schedule the follow-up appointment.

## 2015-07-07 NOTE — Progress Notes (Signed)
Patient ID: Henry Neal, male   DOB: Oct 20, 1927, 81 y.o.   MRN: [id number]    Date:  07/07/2015   ID:  Henry Neal, DOB [birth year], MRN [id number]  PCP:  Merrilee Seashore, MD  Primary Cardiologist:  Berry/Croitoru  Chief Complaint  Patient presents with  . Follow-up    Seen at [facility] for chest discomfort.  Has had occas. mild chest discomfort since with some mild SOB. Complaining of feeling sluggish over the past several weeks.     History of Present Illness: Henry Neal is a 81 y.o. male widowed Caucasian male father of 2, grandfather of one grandchild patient, of Dr. Sallyanne Kuster, Oneida Alar and [place]. I last saw him in the office 05/18/14.He has a history of carotid artery disease andhe was scheduled to undergo elective right carotid endarterectomy by Dr. Oneida Alar in early January. He also has had an endoluminal stent graft placed by Dr. Oneida Alar of May 2000 and for abdominal aortic aneurysm. His cardiovascular risk factors are positive for hypertension and hyperlipidemia. He has never had a heart attack or stroke.  He Myoview stress test that showed anteroapical/septal scar with moderate peri-infarct ischemia. Based on this he underwent diagnostic coronary arteriography on 10/10/14 revealing three-vessel disease with an occluded LAD in the midportion, an occluded circumflex marginal branch and moderate RCA disease with preserved LV function. Because he was relatively asymptomatic Dr Gwenlyn Found elected to treat him medically. He underwent elective right carotid endarterectomy on 10/30/13 and had an uncomplicated postoperative course. He was seen postop for her jaw in neck and arm pain and his medicines were adjusted by Dr. Haroldine Laws.  He still works as a Glass blower/designer at age 47. His lipid profile followed by his PCP.  Patient was seen by Dr. Ashby Dawes on 06/17/2015 with fatigue left-sided chest pain.  We were asked to see to evaluate. Patient reports an episode of chest pain which  she describes as narrow pointed to the left sternal border. Was fairly constant and relieved when he burped. He also reports some right and left arm pain which Radiates down the ulnar side of both arms. There is no exertional component to either. He also has some chronic right lower extremity edema.  His last hemoglobin was 10.9 and is being checked for blood in his stool which she has not done yet.  The patient currently denies nausea, vomiting, fever, shortness of breath, orthopnea, dizziness, PND, cough, congestion, abdominal pain, hematochezia, melena, claudication.  Wt Readings from Last 3 Encounters:  07/07/15 195 lb 1.6 oz (88.497 kg)  05/18/15 193 lb 8 oz (87.771 kg)  12/08/14 202 lb (91.627 kg)     Past Medical History  Diagnosis Date  . Palpitation   . AAA (abdominal aortic aneurysm)   . Arthritis   . Hiatal hernia   . Dizziness     takes Antivert daily as needed  . ED (erectile dysfunction)   . Sigmoid diverticulosis   . Thyroid disease   . T12 compression fracture   . Allergic rhinitis     uses Flonase daily  . Chronic venous insufficiency   . Cancer     skin  . Carotid artery occlusion   . Abnormal stress test   . Hypertension     takes Proscar,Dyazide,Avapro,and Metoprolol daily  . GERD (gastroesophageal reflux disease)     takes Omeprazole daily  . Coronary artery disease   . Anginal pain     pain in neck,shoulders,down arms occ  . Shortness of breath   .  Hx of echocardiogram 04/04/2010    showed a borderline dilatd left ventricle with mild left ventricle hypertrophy and an EF 50-55% doppler suggested diastolic dysfunction, although the pattern is probably normal for an 81 yrs old. The left atrium was indeed mildly dilated at 42 mm, but there are no significant valvular abnormalities.   . Hyperlipidemia     Current Outpatient Prescriptions  Medication Sig Dispense Refill  . aspirin EC 81 MG tablet Take 81 mg by mouth daily.      Marland Kitchen atorvastatin (LIPITOR) 40  MG tablet Take 1 tablet (40 mg total) by mouth daily at 6 PM. 30 tablet 2  . Calcium Carbonate-Vitamin D (CALCIUM 600 + D PO) Take 1 tablet by mouth 2 (two) times daily.     . carvedilol (COREG) 6.25 MG tablet Take 1 tablet (6.25 mg total) by mouth 2 (two) times daily with a meal. 60 tablet 2  . diclofenac (VOLTAREN) 50 MG EC tablet Take 50 mg by mouth 2 (two) times daily.     . finasteride (PROSCAR) 5 MG tablet take 1 tablet by mouth once daily    . fluticasone (FLONASE) 50 MCG/ACT nasal spray Place 1 spray into both nostrils daily as needed for allergies.     Marland Kitchen gabapentin (NEURONTIN) 300 MG capsule Take 300 mg by mouth 2 (two) times daily.  0  . irbesartan (AVAPRO) 150 MG tablet Take 150 mg by mouth every morning.     . isosorbide mononitrate (IMDUR) 30 MG 24 hr tablet Take 1 tablet (30 mg total) by mouth daily. 30 tablet 2  . ketoconazole (NIZORAL) 2 % cream apply to affected area ON FACE ONCE DAILY prn  0  . meclizine (ANTIVERT) 25 MG tablet Take 25 mg by mouth 2 (two) times daily as needed for dizziness.     . mometasone (ELOCON) 0.1 % cream Apply 1 application topically daily as needed (for irritation).     . Multiple Vitamin (MULTIVITAMIN PO) Take by mouth. ALIVE men's vitamin    . NITROSTAT 0.4 MG SL tablet place 1 tablet under the tongue if needed every 5 minutes for chest pain CALL 911 AFTER 3 DOSES 25 tablet 10  . pantoprazole (PROTONIX) 40 MG tablet Take 40 mg by mouth daily.  0  . triamterene-hydrochlorothiazide (MAXZIDE-25) 37.5-25 MG per tablet take 1 tablet by mouth once daily 90 tablet 3   No current facility-administered medications for this visit.    Allergies:    Allergies  Allergen Reactions  . Sinequan [Doxepin Hcl] Other (See Comments)    hallucinations  . Verapamil Other (See Comments)    Caused heart to race  . Procaine Hcl Other (See Comments)    Tingling all over as soon as it was injected    Social History:  The patient  reports that he quit smoking about 37  years ago. His smoking use included Cigarettes. He has a 60 pack-year smoking history. He quit smokeless tobacco use about 30 years ago. He reports that he drinks about 4.2 oz of alcohol per week. He reports that he does not use illicit drugs.   Family history:   Family History  Problem Relation Age of Onset  . Heart disease Mother     before age 65  . COPD Mother   . Diabetes Mother   . Hyperlipidemia Mother   . Hypertension Mother     ROS:  Please see the history of present illness.  All other systems reviewed and negative.  PHYSICAL EXAM: VS:  BP 92/50 mmHg  Pulse 56  Ht 5\' 10"  (1.778 m)  Wt 195 lb 1.6 oz (88.497 kg)  BMI 27.99 kg/m2 Well nourished, well developed, in no acute distress HEENT: Pupils are equal round react to light accommodation extraocular movements are intact.  Neck: no JVDNo cervical lymphadenopathy.Positive hepatojugular reflux  Cardiac: Regular rate and rhythm without murmurs rubs or gallops. Lungs:  clear to auscultation bilaterally, no wheezing, rhonchi or rales Abd: soft, nontender, positive bowel sounds all quadrants, no hepatosplenomegaly Ext:1+ rightower extremity edema.  2+ radial and dorsalis pedis pulses. Skin: warm and dry Neuro:  Grossly normal  EKG:  Left bundle branch block PVCs rate 56 bpm  ASSESSMENT AND PLAN:  Problem List Items Addressed This Visit    PAD (peripheral artery disease)   LBBB (left bundle branch block)   Hyperlipidemia   Essential hypertension   Chest pain - Primary   Relevant Orders   EKG 12-Lead   Carotid artery stenosis      Chest pain, atypical The chest pain he was describing resolved after burping. He also has some left and right arm pain which appears to be related to his cervical spine. There is no exertional component to either one of these episodes.  continue aspirin, beta blocker.   Essential hypertension Blood pressures a lot on the low side. Does not appear to be symptomatic. No changes to current  medications.  Chronic left bundle branch block Hyperlipidemia: Continues Lipitor

## 2015-07-08 ENCOUNTER — Encounter (HOSPITAL_COMMUNITY): Payer: Self-pay

## 2015-07-12 ENCOUNTER — Encounter (HOSPITAL_COMMUNITY): Payer: Self-pay

## 2015-07-14 ENCOUNTER — Encounter (HOSPITAL_COMMUNITY): Payer: Self-pay

## 2015-07-15 ENCOUNTER — Encounter (HOSPITAL_COMMUNITY): Payer: Self-pay

## 2015-07-19 ENCOUNTER — Encounter (HOSPITAL_COMMUNITY): Payer: Self-pay

## 2015-07-21 ENCOUNTER — Encounter (HOSPITAL_COMMUNITY): Payer: Self-pay

## 2015-07-22 ENCOUNTER — Encounter (HOSPITAL_COMMUNITY): Payer: Self-pay

## 2015-07-26 ENCOUNTER — Encounter (HOSPITAL_COMMUNITY): Payer: Self-pay

## 2015-07-28 ENCOUNTER — Ambulatory Visit (INDEPENDENT_AMBULATORY_CARE_PROVIDER_SITE_OTHER): Payer: Medicare Other | Admitting: Podiatry

## 2015-07-28 ENCOUNTER — Encounter: Payer: Self-pay | Admitting: Podiatry

## 2015-07-28 ENCOUNTER — Ambulatory Visit: Payer: Medicare Other | Admitting: Podiatry

## 2015-07-28 ENCOUNTER — Encounter (HOSPITAL_COMMUNITY): Payer: Self-pay

## 2015-07-28 DIAGNOSIS — B351 Tinea unguium: Secondary | ICD-10-CM | POA: Diagnosis not present

## 2015-07-28 DIAGNOSIS — M79676 Pain in unspecified toe(s): Secondary | ICD-10-CM | POA: Diagnosis not present

## 2015-07-28 NOTE — Progress Notes (Signed)
Patient ID: Henry Neal, male   DOB: 12/16/1926, 81 y.o.   MRN: [id number] Complaint:  Visit Type: Patient returns to my office for continued preventative foot care services. Complaint: Patient states" my nails have grown long and thick and become painful to walk and wear shoes"  He presents for preventative foot care services. No changes to ROS  Podiatric Exam: Vascular: dorsalis pedis and posterior tibial pulses are palpable bilateral. Capillary return is immediate. Temperature gradient is WNL. Skin turgor WNL  Sensorium: Normal Semmes Weinstein monofilament test. Normal tactile sensation bilaterally. Nail Exam: Pt has thick disfigured discolored nails with subungual debris noted bilateral entire nail hallux through fifth toenails Ulcer Exam: There is no evidence of ulcer or pre-ulcerative changes or infection. Orthopedic Exam: Muscle tone and strength are WNL. No limitations in general ROM. No crepitus or effusions noted. Foot type and digits show no abnormalities. Bony prominences are unremarkable. Skin: No Porokeratosis. No infection or ulcers. Distal Clavi second toe right.  Diagnosis:  Tinea unguium, Pain in right toe, pain in left toes  Treatment & Plan Procedures and Treatment: Consent by patient was obtained for treatment procedures. The patient understood the discussion of treatment and procedures well. All questions were answered thoroughly reviewed. Debridement of mycotic and hypertrophic toenails, 1 through 5 bilateral and clearing of subungual debris. No ulceration, no infection noted.  Return Visit-Office Procedure: Patient instructed to return to the office for a follow up visit 3 months for continued evaluation and treatment. 

## 2015-07-29 ENCOUNTER — Encounter (HOSPITAL_COMMUNITY): Payer: Self-pay

## 2015-08-02 ENCOUNTER — Encounter (HOSPITAL_COMMUNITY): Payer: Self-pay | Admitting: Emergency Medicine

## 2015-08-02 ENCOUNTER — Inpatient Hospital Stay (HOSPITAL_COMMUNITY)
Admission: EM | Admit: 2015-08-02 | Discharge: 2015-08-09 | DRG: 246 | Disposition: A | Discharge status: Home Health | Payer: Medicare Other | Attending: Cardiology | Admitting: Cardiology

## 2015-08-02 ENCOUNTER — Emergency Department (HOSPITAL_COMMUNITY): Payer: Medicare Other

## 2015-08-02 ENCOUNTER — Encounter (HOSPITAL_COMMUNITY): Payer: Self-pay

## 2015-08-02 DIAGNOSIS — M79662 Pain in left lower leg: Secondary | ICD-10-CM | POA: Diagnosis not present

## 2015-08-02 DIAGNOSIS — R0602 Shortness of breath: Secondary | ICD-10-CM | POA: Diagnosis not present

## 2015-08-02 DIAGNOSIS — Z791 Long term (current) use of non-steroidal anti-inflammatories (NSAID): Secondary | ICD-10-CM

## 2015-08-02 DIAGNOSIS — I10 Essential (primary) hypertension: Secondary | ICD-10-CM | POA: Diagnosis present

## 2015-08-02 DIAGNOSIS — R7989 Other specified abnormal findings of blood chemistry: Secondary | ICD-10-CM | POA: Diagnosis present

## 2015-08-02 DIAGNOSIS — I5023 Acute on chronic systolic (congestive) heart failure: Secondary | ICD-10-CM | POA: Diagnosis present

## 2015-08-02 DIAGNOSIS — I214 Non-ST elevation (NSTEMI) myocardial infarction: Secondary | ICD-10-CM | POA: Diagnosis not present

## 2015-08-02 DIAGNOSIS — I739 Peripheral vascular disease, unspecified: Secondary | ICD-10-CM

## 2015-08-02 DIAGNOSIS — Z8679 Personal history of other diseases of the circulatory system: Secondary | ICD-10-CM

## 2015-08-02 DIAGNOSIS — K219 Gastro-esophageal reflux disease without esophagitis: Secondary | ICD-10-CM | POA: Diagnosis present

## 2015-08-02 DIAGNOSIS — J9601 Acute respiratory failure with hypoxia: Secondary | ICD-10-CM | POA: Diagnosis present

## 2015-08-02 DIAGNOSIS — Z888 Allergy status to other drugs, medicaments and biological substances status: Secondary | ICD-10-CM

## 2015-08-02 DIAGNOSIS — Z95828 Presence of other vascular implants and grafts: Secondary | ICD-10-CM

## 2015-08-02 DIAGNOSIS — I447 Left bundle-branch block, unspecified: Secondary | ICD-10-CM | POA: Diagnosis present

## 2015-08-02 DIAGNOSIS — R21 Rash and other nonspecific skin eruption: Secondary | ICD-10-CM | POA: Diagnosis not present

## 2015-08-02 DIAGNOSIS — I501 Left ventricular failure: Secondary | ICD-10-CM | POA: Diagnosis present

## 2015-08-02 DIAGNOSIS — I2510 Atherosclerotic heart disease of native coronary artery without angina pectoris: Secondary | ICD-10-CM | POA: Diagnosis present

## 2015-08-02 DIAGNOSIS — I2584 Coronary atherosclerosis due to calcified coronary lesion: Secondary | ICD-10-CM | POA: Diagnosis present

## 2015-08-02 DIAGNOSIS — Z8249 Family history of ischemic heart disease and other diseases of the circulatory system: Secondary | ICD-10-CM

## 2015-08-02 DIAGNOSIS — Z7982 Long term (current) use of aspirin: Secondary | ICD-10-CM

## 2015-08-02 DIAGNOSIS — M1990 Unspecified osteoarthritis, unspecified site: Secondary | ICD-10-CM | POA: Diagnosis present

## 2015-08-02 DIAGNOSIS — N179 Acute kidney failure, unspecified: Secondary | ICD-10-CM | POA: Diagnosis present

## 2015-08-02 DIAGNOSIS — E785 Hyperlipidemia, unspecified: Secondary | ICD-10-CM | POA: Diagnosis present

## 2015-08-02 DIAGNOSIS — N182 Chronic kidney disease, stage 2 (mild): Secondary | ICD-10-CM | POA: Diagnosis present

## 2015-08-02 DIAGNOSIS — R42 Dizziness and giddiness: Secondary | ICD-10-CM | POA: Diagnosis present

## 2015-08-02 DIAGNOSIS — Z955 Presence of coronary angioplasty implant and graft: Secondary | ICD-10-CM

## 2015-08-02 DIAGNOSIS — I219 Acute myocardial infarction, unspecified: Secondary | ICD-10-CM

## 2015-08-02 DIAGNOSIS — R778 Other specified abnormalities of plasma proteins: Secondary | ICD-10-CM | POA: Diagnosis present

## 2015-08-02 DIAGNOSIS — I255 Ischemic cardiomyopathy: Secondary | ICD-10-CM | POA: Diagnosis present

## 2015-08-02 DIAGNOSIS — M79661 Pain in right lower leg: Secondary | ICD-10-CM | POA: Diagnosis not present

## 2015-08-02 DIAGNOSIS — Z87891 Personal history of nicotine dependence: Secondary | ICD-10-CM

## 2015-08-02 DIAGNOSIS — I130 Hypertensive heart and chronic kidney disease with heart failure and stage 1 through stage 4 chronic kidney disease, or unspecified chronic kidney disease: Secondary | ICD-10-CM | POA: Diagnosis present

## 2015-08-02 DIAGNOSIS — I509 Heart failure, unspecified: Secondary | ICD-10-CM

## 2015-08-02 DIAGNOSIS — N289 Disorder of kidney and ureter, unspecified: Secondary | ICD-10-CM

## 2015-08-02 DIAGNOSIS — E876 Hypokalemia: Secondary | ICD-10-CM | POA: Diagnosis not present

## 2015-08-02 DIAGNOSIS — E871 Hypo-osmolality and hyponatremia: Secondary | ICD-10-CM

## 2015-08-02 HISTORY — DX: Acute myocardial infarction, unspecified: I21.9

## 2015-08-02 LAB — I-STAT ARTERIAL BLOOD GAS, ED
ACID-BASE DEFICIT: 6 mmol/L — AB (ref 0.0–2.0)
BICARBONATE: 22.3 meq/L (ref 20.0–24.0)
O2 Saturation: 97 %
TCO2: 24 mmol/L (ref 0–100)
pCO2 arterial: 54.9 mmHg — ABNORMAL HIGH (ref 35.0–45.0)
pH, Arterial: 7.214 — ABNORMAL LOW (ref 7.350–7.450)
pO2, Arterial: 104 mmHg — ABNORMAL HIGH (ref 80.0–100.0)

## 2015-08-02 LAB — I-STAT TROPONIN, ED: Troponin i, poc: 0.21 ng/mL (ref 0.00–0.08)

## 2015-08-02 MED ORDER — FUROSEMIDE 10 MG/ML IJ SOLN
40.0000 mg | Freq: Once | INTRAMUSCULAR | Status: AC
  Administered 2015-08-03: 40 mg via INTRAVENOUS
  Filled 2015-08-02: qty 4

## 2015-08-02 MED ORDER — NITROGLYCERIN IN D5W 200-5 MCG/ML-% IV SOLN
5.0000 ug/min | INTRAVENOUS | Status: DC
  Administered 2015-08-03: 5 ug/min via INTRAVENOUS
  Filled 2015-08-02: qty 250

## 2015-08-02 MED ORDER — ALBUTEROL SULFATE (2.5 MG/3ML) 0.083% IN NEBU
5.0000 mg | INHALATION_SOLUTION | Freq: Once | RESPIRATORY_TRACT | Status: AC
  Administered 2015-08-02: 5 mg via RESPIRATORY_TRACT
  Filled 2015-08-02: qty 6

## 2015-08-02 NOTE — ED Notes (Signed)
Pt spent the day car shopping and went to dinner when he began to experience SOB (worse than normal) and a "dull steady pain" in the center of his chest.  He reports that he has been experiencing SOB for a few months however has been able to recover quickly.  Reports that he has not been sick but once in the ambulance he began to cough and sound congested.  His chest pain is now a 2 from a 5, no nitro was given but he did get 324 ASA.  Reports "have had heart blocks in the past but no stints."

## 2015-08-02 NOTE — ED Provider Notes (Signed)
CSN: [id number]     Arrival date & time 08/02/15  2251 History   First MD Initiated Contact with Patient 08/02/15 2252     Chief Complaint  Patient presents with  . Shortness of Breath  . Chest Pain   HPI History is obtained from the patient and the medical records. The patient states he's had trouble with shortness of breath for a long period of time. Patient's unable to give me a specific timeframe. However, upon reviewing the medical records, there is no mention of significant shortness of breath when he was seen by the cardiologist last month or when he was seen by a podiatrist last week. Patient started to have worsening shortness of breath, malaise and diaphoresis. He felt bad enough that he needed to call ambulance today. Patient reported chest discomfort to EMS. He was given nitroglycerin and his pain decreased from 5 down to a 2. Patient denies any chest pain during my exam. He has not had any fevers. No abdominal pain. No rectal bleeding or blood in the stool Past Medical History  Diagnosis Date  . Palpitation   . AAA (abdominal aortic aneurysm) ([place])   . Arthritis   . Hiatal hernia   . Dizziness     takes Antivert daily as needed  . ED (erectile dysfunction)   . Sigmoid diverticulosis   . Thyroid disease   . T12 compression fracture ([place])   . Allergic rhinitis     uses Flonase daily  . Chronic venous insufficiency   . Cancer ([place])     skin  . Carotid artery occlusion   . Abnormal stress test   . Hypertension     takes Proscar,Dyazide,Avapro,and Metoprolol daily  . GERD (gastroesophageal reflux disease)     takes Omeprazole daily  . Coronary artery disease   . Anginal pain (HCC)     pain in neck,shoulders,down arms occ  . Shortness of breath   . Hx of echocardiogram 04/04/2010    showed a borderline dilatd left ventricle with mild left ventricle hypertrophy and an EF 50-55% doppler suggested diastolic dysfunction, although the pattern is probably normal for an 81 yrs  old. The left atrium was indeed mildly dilated at 42 mm, but there are no significant valvular abnormalities.   . Hyperlipidemia    Past Surgical History  Procedure Laterality Date  . Rotator cuff repair  2003    left arm  . Abdominal aortic aneurysm repair  02/24/2009    AAA Stenting  . Hernia repair Bilateral   . Hand surgery Right     tendon, lft 90's  . Endarterectomy Right 10/27/2013    Procedure: ENDARTERECTOMY CAROTID;  Surgeon: Elam Dutch, MD;  Location: [facility];  Service: Vascular;  Laterality: Right;  . Melanoma removed    . Left heart catheterization with coronary angiogram N/A 10/12/2013    Procedure: LEFT HEART CATHETERIZATION WITH CORONARY ANGIOGRAM;  Surgeon: Lorretta Harp, MD;  Location: [facility] CATH LAB;  Service: Cardiovascular;  Laterality: N/A;   Family History  Problem Relation Age of Onset  . Heart disease Mother     before age 10  . COPD Mother   . Diabetes Mother   . Hyperlipidemia Mother   . Hypertension Mother    Social History  Substance Use Topics  . Smoking status: Former Smoker -- 2.00 packs/day for 30 years    Types: Cigarettes    Quit date: 10/22/1977  . Smokeless tobacco: Former Systems developer    Quit date: 10/22/1984  .  Alcohol Use: 4.2 oz/week    7 Cans of beer per week    Review of Systems  All other systems reviewed and are negative.     Allergies  Sinequan; Verapamil; and Procaine hcl  Home Medications   Prior to Admission medications   Medication Sig Start Date End Date Taking? Authorizing Provider  aspirin EC 81 MG tablet Take 81 mg by mouth daily.      Historical Provider, MD  atorvastatin (LIPITOR) 40 MG tablet Take 1 tablet (40 mg total) by mouth daily at 6 PM. 10/30/13   Samantha J Rhyne, PA-C  Calcium Carbonate-Vitamin D (CALCIUM 600 + D PO) Take 1 tablet by mouth 2 (two) times daily.     Historical Provider, MD  carvedilol (COREG) 6.25 MG tablet Take 1 tablet (6.25 mg total) by mouth 2 (two) times daily with a meal. 10/30/13    Samantha J Rhyne, PA-C  diclofenac (VOLTAREN) 50 MG EC tablet Take 50 mg by mouth 2 (two) times daily.  09/25/13   Historical Provider, MD  finasteride (PROSCAR) 5 MG tablet take 1 tablet by mouth once daily 11/19/14   Historical Provider, MD  fluticasone (FLONASE) 50 MCG/ACT nasal spray Place 1 spray into both nostrils daily as needed for allergies.  08/09/13   Historical Provider, MD  gabapentin (NEURONTIN) 300 MG capsule Take 300 mg by mouth 2 (two) times daily. 11/01/14   Historical Provider, MD  irbesartan (AVAPRO) 150 MG tablet Take 150 mg by mouth every morning.     Historical Provider, MD  isosorbide mononitrate (IMDUR) 30 MG 24 hr tablet Take 1 tablet (30 mg total) by mouth daily. 10/30/13   Samantha J Rhyne, PA-C  ketoconazole (NIZORAL) 2 % cream apply to affected area ON FACE ONCE DAILY prn 04/27/15   Historical Provider, MD  meclizine (ANTIVERT) 25 MG tablet Take 25 mg by mouth 2 (two) times daily as needed for dizziness.     Historical Provider, MD  mometasone (ELOCON) 0.1 % cream Apply 1 application topically daily as needed (for irritation).  07/30/13   Historical Provider, MD  Multiple Vitamin (MULTIVITAMIN PO) Take by mouth. ALIVE men's vitamin    Historical Provider, MD  NITROSTAT 0.4 MG SL tablet place 1 tablet under the tongue if needed every 5 minutes for chest pain CALL 911 AFTER 3 DOSES 06/20/15   Brittainy M Simmons, PA-C  pantoprazole (PROTONIX) 40 MG tablet Take 40 mg by mouth daily. 02/14/15   Historical Provider, MD  triamterene-hydrochlorothiazide (TKZSWFU-93) 37.5-25 MG per tablet take 1 tablet by mouth once daily 09/02/14   Sanda Klein, MD   There were no vitals taken for this visit. Physical Exam  Constitutional: He appears well-developed and well-nourished. He appears distressed.  HENT:  Head: Normocephalic and atraumatic.  Right Ear: External ear normal.  Left Ear: External ear normal.  Eyes: Conjunctivae are normal. Right eye exhibits no discharge. Left eye exhibits  no discharge. No scleral icterus.  Neck: Neck supple. No tracheal deviation present.  Cardiovascular: Normal rate, regular rhythm and intact distal pulses.   Pulmonary/Chest: Effort normal. No stridor. No respiratory distress. He has no wheezes. He has rales (diffuse crackles).  Abdominal: Soft. Bowel sounds are normal. He exhibits no distension. There is no tenderness. There is no rebound and no guarding.  Musculoskeletal: He exhibits edema ( edema bilateral lower extremities). He exhibits no tenderness.  Neurological: He is alert. He has normal strength. No cranial nerve deficit (no facial droop, extraocular movements intact, no slurred  speech) or sensory deficit. He exhibits normal muscle tone. He displays no seizure activity. Coordination normal.  Skin: Skin is warm. No rash noted. He is diaphoretic. There is pallor.  Psychiatric: He has a normal mood and affect.  Nursing note and vitals reviewed.   ED Course  Procedures (including critical care time)  CRITICAL CARE Performed by: JJKKX,FGH Total critical care time: 40 Critical care time was exclusive of separately billable procedures and treating other patients. Critical care was necessary to treat or prevent imminent or life-threatening deterioration. Critical care was time spent personally by me on the following activities: development of treatment plan with patient and/or surrogate as well as nursing, discussions with consultants, evaluation of patient's response to treatment, examination of patient, obtaining history from patient or surrogate, ordering and performing treatments and interventions, ordering and review of laboratory studies, ordering and review of radiographic studies, pulse oximetry and re-evaluation of patient's condition.  Labs Review Labs Reviewed  BASIC METABOLIC PANEL - Abnormal; Notable for the following:    Sodium 129 (*)    Chloride 96 (*)    CO2 20 (*)    Glucose, Bld 146 (*)    BUN 24 (*)    Creatinine,  Ser 1.35 (*)    GFR calc non Af Amer 46 (*)    GFR calc Af Amer 54 (*)    All other components within normal limits  CBC - Abnormal; Notable for the following:    WBC 12.2 (*)    RBC 4.16 (*)    Hemoglobin 11.8 (*)    HCT 36.9 (*)    All other components within normal limits  BRAIN NATRIURETIC PEPTIDE - Abnormal; Notable for the following:    B Natriuretic Peptide 1953.0 (*)    All other components within normal limits   All other components within normal limits  HEPARIN LEVEL (UNFRACTIONATED) - Abnormal; Notable for the following:    Heparin Unfractionated 0.14 (*)    All other components within normal limits  BASIC METABOLIC PANEL - Abnormal; Notable for the following:    Sodium 132 (*)    Chloride 94 (*)    Glucose, Bld 136 (*)    BUN 25 (*)    Creatinine, Ser 1.41 (*)    GFR calc non Af Amer 44 (*)    GFR calc Af Amer 51 (*)    All other components within normal limits  CBC - Abnormal; Notable for the following:    RBC 3.56 (*)    Hemoglobin 9.9 (*)    HCT 31.3 (*)    All other components within normal limits  BASIC METABOLIC PANEL - Abnormal; Notable for the following:    Sodium 131 (*)    Chloride 94 (*)    Glucose, Bld 104 (*)    BUN 32 (*)    Creatinine, Ser 1.47 (*)    Calcium 8.4 (*)    GFR calc non Af Amer 42 (*)    GFR calc Af Amer 48 (*)    All other components within normal limits  PROTIME-INR - Abnormal; Notable for the following:    Prothrombin Time 17.0 (*)    All other components within normal limits  I-STAT TROPOININ, ED - Abnormal; Notable for the following:    Troponin i, poc 0.21 (*)    All other components within normal limits  I-STAT ARTERIAL BLOOD GAS, ED - Abnormal; Notable for the following:    pH, Arterial 7.214 (*)    pCO2 arterial 54.9 (*)  pO2, Arterial 104.0 (*)    Acid-base deficit 6.0 (*)    All other components within normal limits  POCT I-STAT 7, (LYTES, BLD GAS, ICA,H+H) - Abnormal; Notable for the following:    pO2,  Arterial 76.0 (*)    Bicarbonate 27.4 (*)    Acid-Base Excess 3.0 (*)    Sodium 134 (*)    Potassium 3.2 (*)    Calcium, Ion 1.09 (*)    HCT 31.0 (*)    Hemoglobin 10.5 (*)    All other components within normal limits     Imaging Review Dg Chest Port 1 View  08/02/2015  CLINICAL DATA:  81 year old male with shortness of breath and abnormal pulmonary auscultation. Initial encounter. EXAM: PORTABLE CHEST 1 VIEW COMPARISON:  10/09/2013. FINDINGS: Portable AP semi upright view at 2329 hours. Diffuse pulmonary interstitial opacity. Cardiomegaly appears increased since 2014. Other mediastinal contours are within normal limits. Small right pleural effusion suspected. No pneumothorax. Patchy and confluent retrocardiac opacity, favor atelectasis. IMPRESSION: Acute pulmonary edema. Small right effusion. Cardiomegaly appears increased since 2014. Electronically Signed   By: Genevie Ann M.D.   On: 08/02/2015 23:39   I have personally reviewed and evaluated these images and lab results as part of my medical decision-making.   EKG Interpretation   Date/Time:  Tuesday August 02 2015 23:17:45 EDT Ventricular Rate:  84 PR Interval:  188 QRS Duration: 177 QT Interval:  410 QTC Calculation: 485 R Axis:   10 Text Interpretation:  Sinus rhythm Multiple ventricular premature  complexes IVCD, consider atypical LBBB Confirmed by Nolyn Eilert  MD-J, Braun Rocca  ([id number]) on 08/02/2015 11:23:43 PM     2325  Started on 100% non-rebreather. MDM   Final diagnoses:  Acute congestive heart failure, unspecified congestive heart failure type (HCC)  Hyponatremia  Renal insufficiency    She is exam is suggestive of an acute CHF exacerbation. We'll start BiPAP. Plan on x-rays and laboratory tests.  Monitor closely.  Pt had significant improvement with NTG infusion, lasix and bipap.  Diaphoresis resolved.  Work of breathing improved significantly.  CXR confirmed clinical suspicion of CHF exacerbation.  Increased troponin.   ?nstemi as cause of his flash pulmonary edema.  Admitted in stable condition.  Consulted with cardiology for admission.  Pt     Dorie Rank, MD 08/04/15 2221

## 2015-08-03 ENCOUNTER — Inpatient Hospital Stay (HOSPITAL_COMMUNITY): Payer: Medicare Other

## 2015-08-03 DIAGNOSIS — Z7982 Long term (current) use of aspirin: Secondary | ICD-10-CM | POA: Diagnosis not present

## 2015-08-03 DIAGNOSIS — I5023 Acute on chronic systolic (congestive) heart failure: Secondary | ICD-10-CM | POA: Diagnosis present

## 2015-08-03 DIAGNOSIS — I214 Non-ST elevation (NSTEMI) myocardial infarction: Secondary | ICD-10-CM | POA: Diagnosis present

## 2015-08-03 DIAGNOSIS — I2584 Coronary atherosclerosis due to calcified coronary lesion: Secondary | ICD-10-CM | POA: Diagnosis present

## 2015-08-03 DIAGNOSIS — R42 Dizziness and giddiness: Secondary | ICD-10-CM | POA: Diagnosis present

## 2015-08-03 DIAGNOSIS — Z791 Long term (current) use of non-steroidal anti-inflammatories (NSAID): Secondary | ICD-10-CM | POA: Diagnosis not present

## 2015-08-03 DIAGNOSIS — I509 Heart failure, unspecified: Secondary | ICD-10-CM

## 2015-08-03 DIAGNOSIS — I259 Chronic ischemic heart disease, unspecified: Secondary | ICD-10-CM

## 2015-08-03 DIAGNOSIS — M1990 Unspecified osteoarthritis, unspecified site: Secondary | ICD-10-CM | POA: Diagnosis present

## 2015-08-03 DIAGNOSIS — K219 Gastro-esophageal reflux disease without esophagitis: Secondary | ICD-10-CM | POA: Diagnosis present

## 2015-08-03 DIAGNOSIS — N289 Disorder of kidney and ureter, unspecified: Secondary | ICD-10-CM | POA: Insufficient documentation

## 2015-08-03 DIAGNOSIS — Z8679 Personal history of other diseases of the circulatory system: Secondary | ICD-10-CM | POA: Diagnosis not present

## 2015-08-03 DIAGNOSIS — R7989 Other specified abnormal findings of blood chemistry: Secondary | ICD-10-CM | POA: Diagnosis not present

## 2015-08-03 DIAGNOSIS — R079 Chest pain, unspecified: Secondary | ICD-10-CM | POA: Diagnosis not present

## 2015-08-03 DIAGNOSIS — I2582 Chronic total occlusion of coronary artery: Secondary | ICD-10-CM | POA: Diagnosis not present

## 2015-08-03 DIAGNOSIS — E871 Hypo-osmolality and hyponatremia: Secondary | ICD-10-CM | POA: Diagnosis present

## 2015-08-03 DIAGNOSIS — I10 Essential (primary) hypertension: Secondary | ICD-10-CM

## 2015-08-03 DIAGNOSIS — R0602 Shortness of breath: Secondary | ICD-10-CM | POA: Diagnosis present

## 2015-08-03 DIAGNOSIS — Z87891 Personal history of nicotine dependence: Secondary | ICD-10-CM | POA: Diagnosis not present

## 2015-08-03 DIAGNOSIS — N182 Chronic kidney disease, stage 2 (mild): Secondary | ICD-10-CM | POA: Diagnosis present

## 2015-08-03 DIAGNOSIS — M79662 Pain in left lower leg: Secondary | ICD-10-CM | POA: Diagnosis not present

## 2015-08-03 DIAGNOSIS — R21 Rash and other nonspecific skin eruption: Secondary | ICD-10-CM | POA: Diagnosis not present

## 2015-08-03 DIAGNOSIS — Z95828 Presence of other vascular implants and grafts: Secondary | ICD-10-CM | POA: Diagnosis not present

## 2015-08-03 DIAGNOSIS — I739 Peripheral vascular disease, unspecified: Secondary | ICD-10-CM | POA: Diagnosis present

## 2015-08-03 DIAGNOSIS — J9601 Acute respiratory failure with hypoxia: Secondary | ICD-10-CM | POA: Diagnosis present

## 2015-08-03 DIAGNOSIS — I2510 Atherosclerotic heart disease of native coronary artery without angina pectoris: Secondary | ICD-10-CM | POA: Diagnosis present

## 2015-08-03 DIAGNOSIS — Z8249 Family history of ischemic heart disease and other diseases of the circulatory system: Secondary | ICD-10-CM | POA: Diagnosis not present

## 2015-08-03 DIAGNOSIS — Z888 Allergy status to other drugs, medicaments and biological substances status: Secondary | ICD-10-CM | POA: Diagnosis not present

## 2015-08-03 DIAGNOSIS — R778 Other specified abnormalities of plasma proteins: Secondary | ICD-10-CM | POA: Diagnosis present

## 2015-08-03 DIAGNOSIS — E785 Hyperlipidemia, unspecified: Secondary | ICD-10-CM

## 2015-08-03 DIAGNOSIS — I501 Left ventricular failure: Secondary | ICD-10-CM | POA: Diagnosis not present

## 2015-08-03 DIAGNOSIS — I130 Hypertensive heart and chronic kidney disease with heart failure and stage 1 through stage 4 chronic kidney disease, or unspecified chronic kidney disease: Secondary | ICD-10-CM | POA: Diagnosis present

## 2015-08-03 DIAGNOSIS — M79661 Pain in right lower leg: Secondary | ICD-10-CM | POA: Diagnosis not present

## 2015-08-03 DIAGNOSIS — E876 Hypokalemia: Secondary | ICD-10-CM | POA: Diagnosis not present

## 2015-08-03 DIAGNOSIS — N179 Acute kidney failure, unspecified: Secondary | ICD-10-CM | POA: Diagnosis present

## 2015-08-03 DIAGNOSIS — I447 Left bundle-branch block, unspecified: Secondary | ICD-10-CM | POA: Diagnosis present

## 2015-08-03 LAB — CBC
HEMATOCRIT: 36.9 % — AB (ref 39.0–52.0)
HEMOGLOBIN: 11.8 g/dL — AB (ref 13.0–17.0)
MCH: 28.4 pg (ref 26.0–34.0)
MCHC: 32 g/dL (ref 30.0–36.0)
MCV: 88.7 fL (ref 78.0–100.0)
Platelets: 208 10*3/uL (ref 150–400)
RBC: 4.16 MIL/uL — AB (ref 4.22–5.81)
RDW: 14.2 % (ref 11.5–15.5)
WBC: 12.2 10*3/uL — AB (ref 4.0–10.5)

## 2015-08-03 LAB — TSH: TSH: 0.867 u[IU]/mL (ref 0.350–4.500)

## 2015-08-03 LAB — BASIC METABOLIC PANEL
ANION GAP: 13 (ref 5–15)
Anion gap: 11 (ref 5–15)
BUN: 25 mg/dL — AB (ref 6–20)
BUN: 24 mg/dL — AB (ref 6–20)
CALCIUM: 8.9 mg/dL (ref 8.9–10.3)
CO2: 20 mmol/L — ABNORMAL LOW (ref 22–32)
CO2: 27 mmol/L (ref 22–32)
CREATININE: 1.41 mg/dL — AB (ref 0.61–1.24)
Calcium: 8.9 mg/dL (ref 8.9–10.3)
Chloride: 94 mmol/L — ABNORMAL LOW (ref 101–111)
Chloride: 96 mmol/L — ABNORMAL LOW (ref 101–111)
Creatinine, Ser: 1.35 mg/dL — ABNORMAL HIGH (ref 0.61–1.24)
GFR calc non Af Amer: 44 mL/min — ABNORMAL LOW (ref >60)
GFR, EST AFRICAN AMERICAN: 51 mL/min — AB (ref >60)
GFR, EST AFRICAN AMERICAN: 54 mL/min — AB (ref >60)
GFR, EST NON AFRICAN AMERICAN: 46 mL/min — AB (ref >60)
Glucose, Bld: 136 mg/dL — ABNORMAL HIGH (ref 65–99)
Glucose, Bld: 146 mg/dL — ABNORMAL HIGH (ref 65–99)
POTASSIUM: 4.6 mmol/L (ref 3.5–5.1)
Potassium: 3.5 mmol/L (ref 3.5–5.1)
SODIUM: 132 mmol/L — AB (ref 135–145)
SODIUM: 129 mmol/L — AB (ref 135–145)

## 2015-08-03 LAB — TROPONIN I
TROPONIN I: 0.42 ng/mL — AB (ref <0.031)
Troponin I: 0.47 ng/mL — ABNORMAL HIGH (ref <0.031)
Troponin I: 0.45 ng/mL — ABNORMAL HIGH (ref <0.031)

## 2015-08-03 LAB — LIPID PANEL
CHOL/HDL RATIO: 2.4 ratio
CHOLESTEROL: 105 mg/dL (ref 0–200)
HDL: 44 mg/dL (ref >40)
LDL Cholesterol: 51 mg/dL (ref 0–99)
Triglycerides: 49 mg/dL (ref <150)
VLDL: 10 mg/dL (ref 0–40)

## 2015-08-03 LAB — CK TOTAL AND CKMB (NOT AT ARMC)
CK, MB: 8.2 ng/mL — AB (ref 0.5–5.0)
CK, MB: 7.4 ng/mL — AB (ref 0.5–5.0)
CK, MB: 5.5 ng/mL — AB (ref 0.5–5.0)
RELATIVE INDEX: 7 — AB (ref 0.0–2.5)
RELATIVE INDEX: INVALID (ref 0.0–2.5)
Relative Index: 6.4 — ABNORMAL HIGH (ref 0.0–2.5)
Total CK: 128 U/L (ref 49–397)
Total CK: 105 U/L (ref 49–397)
Total CK: 97 U/L (ref 49–397)

## 2015-08-03 LAB — HEPARIN LEVEL (UNFRACTIONATED)
HEPARIN UNFRACTIONATED: 0.43 [IU]/mL (ref 0.30–0.70)
Heparin Unfractionated: 0.14 IU/mL — ABNORMAL LOW (ref 0.30–0.70)

## 2015-08-03 LAB — BRAIN NATRIURETIC PEPTIDE: B NATRIURETIC PEPTIDE 5: 1953 pg/mL — AB (ref 0.0–100.0)

## 2015-08-03 LAB — MRSA PCR SCREENING: MRSA by PCR: NEGATIVE

## 2015-08-03 MED ORDER — POTASSIUM CHLORIDE CRYS ER 20 MEQ PO TBCR
40.0000 meq | EXTENDED_RELEASE_TABLET | Freq: Once | ORAL | Status: AC
  Administered 2015-08-03: 40 meq via ORAL
  Filled 2015-08-03: qty 2

## 2015-08-03 MED ORDER — HEPARIN BOLUS VIA INFUSION
4000.0000 [IU] | Freq: Once | INTRAVENOUS | Status: AC
  Administered 2015-08-03: 4000 [IU] via INTRAVENOUS
  Filled 2015-08-03: qty 4000

## 2015-08-03 MED ORDER — ATORVASTATIN CALCIUM 40 MG PO TABS
40.0000 mg | ORAL_TABLET | Freq: Every day | ORAL | Status: DC
  Administered 2015-08-03 – 2015-08-08 (×6): 40 mg via ORAL
  Filled 2015-08-03 (×6): qty 1

## 2015-08-03 MED ORDER — HEPARIN BOLUS VIA INFUSION
2500.0000 [IU] | Freq: Once | INTRAVENOUS | Status: AC
  Administered 2015-08-03: 2500 [IU] via INTRAVENOUS
  Filled 2015-08-03: qty 2500

## 2015-08-03 MED ORDER — DOPAMINE-DEXTROSE 3.2-5 MG/ML-% IV SOLN
INTRAVENOUS | Status: AC
  Filled 2015-08-03: qty 250

## 2015-08-03 MED ORDER — FUROSEMIDE 10 MG/ML IJ SOLN
40.0000 mg | Freq: Three times a day (TID) | INTRAMUSCULAR | Status: AC
  Administered 2015-08-03 – 2015-08-04 (×2): 40 mg via INTRAVENOUS
  Filled 2015-08-03 (×2): qty 4

## 2015-08-03 MED ORDER — ONDANSETRON HCL 4 MG PO TABS: 4.0000 mg | ORAL_TABLET | Freq: Four times a day (QID) | ORAL | Status: DC | PRN

## 2015-08-03 MED ORDER — PANTOPRAZOLE SODIUM 40 MG PO TBEC
40.0000 mg | DELAYED_RELEASE_TABLET | Freq: Every day | ORAL | Status: DC
  Administered 2015-08-03 – 2015-08-09 (×7): 40 mg via ORAL
  Filled 2015-08-03 (×8): qty 1

## 2015-08-03 MED ORDER — ONDANSETRON HCL 4 MG/2ML IJ SOLN
4.0000 mg | Freq: Four times a day (QID) | INTRAMUSCULAR | Status: DC | PRN
  Administered 2015-08-03: 4 mg via INTRAVENOUS
  Filled 2015-08-03 (×2): qty 2

## 2015-08-03 MED ORDER — ASPIRIN 81 MG PO CHEW
81.0000 mg | CHEWABLE_TABLET | Freq: Every day | ORAL | Status: DC
  Administered 2015-08-03: 81 mg via ORAL
  Filled 2015-08-03: qty 1

## 2015-08-03 MED ORDER — DOPAMINE-DEXTROSE 3.2-5 MG/ML-% IV SOLN
0.0000 ug/kg/min | INTRAVENOUS | Status: DC
  Administered 2015-08-03: 5 ug/kg/min via INTRAVENOUS

## 2015-08-03 MED ORDER — FUROSEMIDE 10 MG/ML IJ SOLN
40.0000 mg | INTRAMUSCULAR | Status: AC
Start: 2015-08-03 — End: 2015-08-03
  Administered 2015-08-03: 40 mg via INTRAVENOUS

## 2015-08-03 MED ORDER — PERFLUTREN LIPID MICROSPHERE
1.0000 mL | INTRAVENOUS | Status: AC | PRN
  Administered 2015-08-03: 2 mL via INTRAVENOUS
  Filled 2015-08-03: qty 10

## 2015-08-03 MED ORDER — CARVEDILOL 6.25 MG PO TABS
6.2500 mg | ORAL_TABLET | Freq: Two times a day (BID) | ORAL | Status: DC
  Administered 2015-08-03: 6.25 mg via ORAL
  Filled 2015-08-03 (×2): qty 1

## 2015-08-03 MED ORDER — IRBESARTAN 150 MG PO TABS
150.0000 mg | ORAL_TABLET | Freq: Every day | ORAL | Status: DC
  Filled 2015-08-03: qty 1

## 2015-08-03 MED ORDER — ISOSORBIDE MONONITRATE ER 30 MG PO TB24
30.0000 mg | ORAL_TABLET | Freq: Every day | ORAL | Status: DC
  Administered 2015-08-03 – 2015-08-09 (×7): 30 mg via ORAL
  Filled 2015-08-03 (×8): qty 1

## 2015-08-03 MED ORDER — SENNA 8.6 MG PO TABS
2.0000 | ORAL_TABLET | Freq: Every day | ORAL | Status: DC | PRN
  Administered 2015-08-06 – 2015-08-08 (×3): 17.2 mg via ORAL
  Filled 2015-08-03 (×4): qty 2

## 2015-08-03 MED ORDER — TRAMADOL HCL 50 MG PO TABS
25.0000 mg | ORAL_TABLET | Freq: Once | ORAL | Status: AC
  Administered 2015-08-03: 25 mg via ORAL
  Filled 2015-08-03: qty 1

## 2015-08-03 MED ORDER — FINASTERIDE 5 MG PO TABS
5.0000 mg | ORAL_TABLET | Freq: Every day | ORAL | Status: DC
  Administered 2015-08-03 – 2015-08-09 (×7): 5 mg via ORAL
  Filled 2015-08-03 (×8): qty 1

## 2015-08-03 MED ORDER — GABAPENTIN 300 MG PO CAPS
300.0000 mg | ORAL_CAPSULE | Freq: Two times a day (BID) | ORAL | Status: DC
  Administered 2015-08-03 – 2015-08-09 (×13): 300 mg via ORAL
  Filled 2015-08-03 (×14): qty 1

## 2015-08-03 MED ORDER — CHLORHEXIDINE GLUCONATE 0.12 % MT SOLN
15.0000 mL | Freq: Two times a day (BID) | OROMUCOSAL | Status: DC
  Administered 2015-08-03 – 2015-08-09 (×12): 15 mL via OROMUCOSAL
  Filled 2015-08-03 (×12): qty 15

## 2015-08-03 MED ORDER — ALUM & MAG HYDROXIDE-SIMETH 200-200-20 MG/5ML PO SUSP: 30.0000 mL | Freq: Four times a day (QID) | ORAL | Status: DC | PRN

## 2015-08-03 MED ORDER — FUROSEMIDE 10 MG/ML IJ SOLN
40.0000 mg | Freq: Four times a day (QID) | INTRAMUSCULAR | Status: DC
  Administered 2015-08-03: 40 mg via INTRAVENOUS
  Filled 2015-08-03: qty 4

## 2015-08-03 MED ORDER — ACETAMINOPHEN 325 MG PO TABS
650.0000 mg | ORAL_TABLET | ORAL | Status: DC | PRN
  Administered 2015-08-03: 650 mg via ORAL
  Filled 2015-08-03: qty 2

## 2015-08-03 MED ORDER — CETYLPYRIDINIUM CHLORIDE 0.05 % MT LIQD
7.0000 mL | Freq: Two times a day (BID) | OROMUCOSAL | Status: DC
  Administered 2015-08-03 – 2015-08-09 (×10): 7 mL via OROMUCOSAL

## 2015-08-03 MED ORDER — CETYLPYRIDINIUM CHLORIDE 0.05 % MT LIQD: 7.0000 mL | Freq: Two times a day (BID) | OROMUCOSAL | Status: DC

## 2015-08-03 MED ORDER — HEPARIN (PORCINE) IN NACL 100-0.45 UNIT/ML-% IJ SOLN
1150.0000 [IU]/h | INTRAMUSCULAR | Status: DC
  Administered 2015-08-03: 1150 [IU]/h via INTRAVENOUS
  Administered 2015-08-03: 900 [IU]/h via INTRAVENOUS
  Filled 2015-08-03 (×2): qty 250

## 2015-08-03 NOTE — Progress Notes (Signed)
81 y/o man with known CAD (100% LAD, OM2 & mod-severe OM1, mRCA with 95% small rPL -- treated medically, Myoview with Anteroapical/septal scar w/ peri-infarct ischemia), s/p CEA & AAA repair.   He presented to [facility] ER with combination of SSCP & SOB/DOE.   Began when walking to parking lot @ [employer] - -noted 1st epigastric discomfort that radiated to jaw & associated with much worse than usual DOE.  Felt a little better --> went to restaurant for dinner.  After Dinner, went to restroom & following BM, had recurrence of SSCP & profound dyspnea --> EMS-- ER.  In ER - tachypneic, diaphoretic & pale.  EKG with persistent LBBB (non-diagnostic) & CXR with pulmonary edema & high BNP.  Troponin mildly elevated - unclear of trend.  Cr up to 1.35 from 0.95.   He was started on BiPAP, NTG drip and given IV lasix with improvement in symptoms.  Weaned off NTG gtt & BiPap unpon arrival to [place].  Subjective:  Feel 'MUCH BETTER" currently with no recurrent SSCP or dypsnea.   Objective:  Vital Signs in the last 24 hours: Temp:  [97.4 F (36.3 C)-97.9 F (36.6 C)] 97.4 F (36.3 C) (10/12 0755) Pulse Rate:  [48-97] 51 (10/12 0800) Resp:  [10-24] 14 (10/12 0800) BP: (94-155)/(34-99) 101/35 mmHg (10/12 0800) SpO2:  [96 %-100 %] 98 % (10/12 0800) FiO2 (%):  [60 %] 60 % (10/12 0301) Weight:  [190 lb 11.2 oz (86.5 kg)] 190 lb 11.2 oz (86.5 kg) (10/12 0301)  Intake/Output from previous day: 10/11 0701 - 10/12 0700 In: 7.4 [I.V.:7.4] Out: 1550 [Urine:1550] Intake/Output from this shift: Total I/O In: 18 [I.V.:18] Out: 300 [Urine:300]  Physical Exam: General appearance: alert, cooperative, appears stated age, no distress and pleasant elderley gentleman.  Normal mood & affect Neck: no adenopathy, no carotid bruit and mild JVP elevation. No bruit.  somewhat stiff ROM Lungs: mild basal rales, but mostly CTAB & non-labored Heart: RRR, nl1 S1 & split S2, occasional Ectopy. Abdomen: soft, non-tender;  bowel sounds normal; no masses,  no organomegaly; diathesis recti Extremities: extremities normal, atraumatic, no cyanosis or edema Pulses: 2+ and symmetric Skin: Skin color, texture, turgor normal. No rashes or lesions or mildly pale Neurologic: Grossly normal  Lab Results:  Recent Labs  08/02/15 2336  WBC 12.2*  HGB 11.8*  PLT 208    Recent Labs  08/02/15 2336  NA 129*  K 4.6  CL 96*  CO2 20*  GLUCOSE 146*  BUN 24*  CREATININE 1.35*    Recent Labs  08/03/15 0520  TROPONINI 0.42*   Hepatic Function Panel No results for input(s): PROT, ALBUMIN, AST, ALT, ALKPHOS, BILITOT, BILIDIR, IBILI in the last 72 hours.  Recent Labs  08/03/15 0520  CHOL 105   No results for input(s): PROTIME in the last 72 hours.  Imaging: Imaging results have been reviewed -- CXR - Acute pulmonary edema. Small right effusion. Cardiomegaly appears increased since 2014.   Tele - NSR 60-80, PVCs (LBBB)  Cardiac Studies: EKG reviewed - no notable change  Assessment/Plan:  Principal Problem:   Acute respiratory failure with hypoxemia (HCC) Active Problems:   Coronary artery disease, occlusive   Acute left-sided CHF (congestive heart failure) (HCC) - unclear if combined or diastolic   Elevated troponin I level - in setting of acute CHF   Essential hypertension   Hyperlipidemia  Cannot be sure of timeline - but presentation seems most c/w ACS with acute CHF (at least diastolic,  but cannot exclude systolic). -> Sx notably improved.  Plan today - converted NTG gtt to Imdur.  IV Heparin. Trend Troponin levels & recheck BMP to follow renal Fxn  Continue home dose stating & BP meds (but will need to monitor BP - currently in low 100s). Hold Parameters fo ARB  I would like to trend troponin levels & track his Cr trajectory.   He has significant existing CAD, but this episode does seem acute --> would probably consider Cardiac Catheterization to reassess & evaluate for new lesions. Check  2D Echo for EF eval -- CXR indicated increased cardiomegaly.  Continue IV Lasix - but reduce to 40mg  TID - does not sound very volume overloaded.    LOS: 0 days    HARDING, DAVID W 08/03/2015, 9:16 AM

## 2015-08-03 NOTE — Progress Notes (Signed)
Patient transported from ED to [facility] without any complications. RT will continue to monitor.

## 2015-08-03 NOTE — Progress Notes (Signed)
ANTICOAGULATION CONSULT NOTE - Initial Consult  Pharmacy Consult for Heparin  Indication: chest pain/ACS  Allergies  Allergen Reactions  . Sinequan [Doxepin Hcl] Other (See Comments)    hallucinations  . Verapamil Other (See Comments)    Caused heart to race  . Procaine Hcl Other (See Comments)    Tingling all over as soon as it was injected    Patient Measurements: Height: 5\' 10"  (177.8 cm) Weight: 190 lb 11.2 oz (86.5 kg) IBW/kg (Calculated) : 73  Vital Signs: Temp: 97.6 F (36.4 C) (10/12 0301) Temp Source: Axillary (10/12 0301) BP: 128/47 mmHg (10/12 0400) Pulse Rate: 53 (10/12 0400)  Labs:  Recent Labs  08/02/15 2336  HGB 11.8*  HCT 36.9*  PLT 208  CREATININE 1.35*    Estimated Creatinine Clearance: 41.3 mL/min (by C-G formula based on Cr of 1.35).   Medical History: Past Medical History  Diagnosis Date  . Palpitation   . AAA (abdominal aortic aneurysm) ([place])   . Arthritis   . Hiatal hernia   . Dizziness     takes Antivert daily as needed  . ED (erectile dysfunction)   . Sigmoid diverticulosis   . Thyroid disease   . T12 compression fracture ([place])   . Allergic rhinitis     uses Flonase daily  . Chronic venous insufficiency   . Cancer ([place])     skin  . Carotid artery occlusion   . Abnormal stress test   . Hypertension     takes Proscar,Dyazide,Avapro,and Metoprolol daily  . GERD (gastroesophageal reflux disease)     takes Omeprazole daily  . Coronary artery disease   . Anginal pain (HCC)     pain in neck,shoulders,down arms occ  . Shortness of breath   . Hx of echocardiogram 04/04/2010    showed a borderline dilatd left ventricle with mild left ventricle hypertrophy and an EF 50-55% doppler suggested diastolic dysfunction, although the pattern is probably normal for an 81 yrs old. The left atrium was indeed mildly dilated at 42 mm, but there are no significant valvular abnormalities.   . Hyperlipidemia     Assessment: 81 y/o M here with  chest pain/shortness of breath, has mildly elevated troponin, Hgb 11.8, mild renal dysfunction, other labs/meds reviewed.   Goal of Therapy:  Heparin level 0.3-0.7 units/ml Monitor platelets by anticoagulation protocol: Yes   Plan:  -Heparin 4000 units BOLUS -Start heparin drip at 900 units/hr -1300 HL -Monitor for bleeding -Daily CBC/HL  Henry Neal 08/03/2015,4:24 AM

## 2015-08-03 NOTE — Progress Notes (Signed)
ANTICOAGULATION CONSULT NOTE - Follow Up Consult  Pharmacy Consult for heparin Indication: chest pain/ACS   Labs:  Recent Labs  08/02/15 2336 08/03/15 0520 08/03/15 1027 08/03/15 1408 08/03/15 1545 08/03/15 2301  HGB 11.8*  --   --   --   --   --   HCT 36.9*  --   --   --   --   --   PLT 208  --   --   --   --   --   HEPARINUNFRC  --   --   --  0.14*  --  0.43  CREATININE 1.35*  --  1.41*  --   --   --   CKTOTAL  --  128 105  --  97  --   CKMB  --  8.2* 7.4*  --  5.5*  --   TROPONINI  --  0.42* 0.47*  --  0.45*  --     Assessment/Plan:  81yo male therapeutic on heparin after rate change. Will continue gtt at current rate and confirm stable with am labs.   Wynona Neat, PharmD, BCPS  08/03/2015,11:52 PM

## 2015-08-03 NOTE — Progress Notes (Signed)
Pt c/o nausea. BP WNL, HR 85.  Dopamine stopped, zofran PRN given.  Will continue to monitor closely and update as needed.

## 2015-08-03 NOTE — Progress Notes (Signed)
HPI: Henry Neal is a 81 y.o. male widowed Caucasian male father of 2, grandfather of one grandchild patient, of Dr. Sallyanne Kuster, Oneida Alar and [place].  He also has had an endoluminal stent graft placed by Dr. Oneida Alar of May 2000 and for abdominal aortic aneurysm. His cardiovascular risk factors are positive for hypertension and hyperlipidemia. He has never had a heart attack or stroke. He has had a Myoview stress test that showed anteroapical/septal scar with moderate peri-infarct ischemia. Based on this he underwent diagnostic coronary arteriography on 10/10/14 revealing three-vessel disease with an occluded LAD in the midportion, an occluded circumflex marginal branch and moderate RCA disease with preserved LV function. Because he was relatively asymptomatic Dr Gwenlyn Found elected to treat him medically. He underwent elective right carotid endarterectomy on 10/30/13 and had an uncomplicated postoperative course. He remains active and still works as a Glass blower/designer at age 71.   Today he presents to [facility] ED with CC of SOB.  He reports he has had some degree of SOB for some time but this acutely got worse this afternoon prompting visit to ED.  EDP reports that on arrival to ED he was tachypneic, diaphoretic and pale.  Work up in ED revealed unchanged ECG, CXR with pulmonary edema and elevated BNP.  He was started on BiPAP, NTG drip and given IV lasix with improvement in symptoms.  Upon arrival to see him in ED, he was resting more comfortably still on BiPAP.  He denied any CP, palpitations, n/v or bleeding.   Review of Systems:     Cardiac Review of Systems: {Y] = yes [ ]  = no  Chest Pain [    ]  Resting SOB [   ] Exertional SOB  [  ]  Orthopnea [  ]   Pedal Edema [   ]    Palpitations [  ] Syncope  [  ]   Presyncope [   ]  General Review of Systems: [Y] = yes [  ]=no Constitional: recent weight change [  ]; anorexia [  ]; fatigue [  ]; nausea [  ]; night sweats [  ]; fever [  ]; or chills [  ];                                                                       Dental: poor dentition[  ];   Eye : blurred vision [  ]; diplopia [   ]; vision changes [  ];  Amaurosis fugax[  ]; Resp: cough [  ];  wheezing[  ];  hemoptysis[  ]; shortness of breath[  ]; paroxysmal nocturnal dyspnea[  ]; dyspnea on exertion[  ]; or orthopnea[  ];  GI:  gallstones[  ], vomiting[  ];  dysphagia[  ]; melena[  ];  hematochezia [  ]; heartburn[  ];   GU: kidney stones [  ]; hematuria[  ];   dysuria [  ];  nocturia[  ];               Skin: rash [  ], swelling[  ];, hair loss[  ];  peripheral edema[  ];  or itching[  ]; Musculosketetal: myalgias[  ];  joint swelling[  ];  joint erythema[  ];  joint pain[  ];  back pain[  ];  Heme/Lymph: bruising[  ];  bleeding[  ];  anemia[  ];  Neuro: TIA[  ];  headaches[  ];  stroke[  ];  vertigo[  ];  seizures[  ];   paresthesias[  ];  difficulty walking[  ];  Psych:depression[  ]; anxiety[  ];  Endocrine: diabetes[  ];  thyroid dysfunction[  ];  Other:  Past Medical History  Diagnosis Date  . Palpitation   . AAA (abdominal aortic aneurysm) ([place])   . Arthritis   . Hiatal hernia   . Dizziness     takes Antivert daily as needed  . ED (erectile dysfunction)   . Sigmoid diverticulosis   . Thyroid disease   . T12 compression fracture ([place])   . Allergic rhinitis     uses Flonase daily  . Chronic venous insufficiency   . Cancer ([place])     skin  . Carotid artery occlusion   . Abnormal stress test   . Hypertension     takes Proscar,Dyazide,Avapro,and Metoprolol daily  . GERD (gastroesophageal reflux disease)     takes Omeprazole daily  . Coronary artery disease   . Anginal pain (H[facility])     pain in neck,shoulders,down arms occ  . Shortness of breath   . Hx of echocardiogram 04/04/2010    showed a borderline dilatd left ventricle with mild left ventricle hypertrophy and an EF 50-55% doppler suggested diastolic dysfunction, although the pattern is probably normal for an 81 yrs  old. The left atrium was indeed mildly dilated at 42 mm, but there are no significant valvular abnormalities.   . Hyperlipidemia     Scheduled Meds: . antiseptic oral rinse  7 mL Mouth Rinse q12n4p  . aspirin  81 mg Oral Daily  . atorvastatin  40 mg Oral QHS  . carvedilol  6.25 mg Oral BID WC  . chlorhexidine  15 mL Mouth Rinse BID  . finasteride  5 mg Oral Daily  . furosemide  40 mg Intravenous 4 times per day  . gabapentin  300 mg Oral BID  . irbesartan  150 mg Oral Daily  . isosorbide mononitrate  30 mg Oral Daily  . pantoprazole  40 mg Oral Daily   Continuous Infusions: . heparin 900 Units/hr (08/03/15 0511)  . nitroGLYCERIN 5 mcg/min (08/03/15 0011)   PRN Meds:.acetaminophen, alum & mag hydroxide-simeth, ondansetron (ZOFRAN) IV, ondansetron, senna   Allergies  Allergen Reactions  . Sinequan [Doxepin Hcl] Other (See Comments)    hallucinations  . Verapamil Other (See Comments)    Caused heart to race  . Procaine Hcl Other (See Comments)    Tingling all over as soon as it was injected    Social History   Social History  . Marital Status: Widowed    Spouse Name: N/A  . Number of Children: N/A  . Years of Education: N/A   Occupational History  . Not on file.   Social History Main Topics  . Smoking status: Former Smoker -- 2.00 packs/day for 30 years    Types: Cigarettes    Quit date: 10/22/1977  . Smokeless tobacco: Former Systems developer    Quit date: 10/22/1984  . Alcohol Use: 4.2 oz/week    7 Cans of beer per week  . Drug Use: No  . Sexual Activity: Not on file   Other Topics Concern  . Not on file   Social History Narrative    Family History  Problem Relation Age of Onset  . Heart disease Mother     before age 23  . COPD Mother   . Diabetes Mother   . Hyperlipidemia Mother   . Hypertension Mother     PHYSICAL EXAM: Filed Vitals:   08/03/15 0430  BP: 94/34  Pulse: 48  Temp:   Resp: 10   General:  Elderly gentleman.  Increased WOB, wearing  BiPAP HEENT: [place], AT, PERRL, EOMI, anicteric Neck: supple. elevated JVP. Carotids 2+ bilat; no bruits. No lymphadenopathy or thryomegaly appreciated. Cor: PMI displaced. Regular rate & rhythm. No rubs, gallops or murmurs but difficult to hear over breath sounds. Lungs: diffuse coarse crackles from bases to apices bilaterally.  No wheezing.  Fair air movement. Abdomen: soft, nontender, nondistended. No hepatosplenomegaly. No bruits or masses. Good bowel sounds. Extremities: no cyanosis, clubbing, rash, + 1 bilateral edema Neuro: alert & oriented x 3, cranial nerves grossly intact. moves all 4 extremities w/o difficulty. Affect pleasant.  ECG:  SR, LBBB with occasional PVC.  No significant change when compared to previous.   Results for orders placed or performed during the hospital encounter of 08/02/15 (from the past 24 hour(s))  Basic metabolic panel     Status: Abnormal   Collection Time: 08/02/15 11:36 PM  Result Value Ref Range   Sodium 129 (L) 135 - 145 mmol/L   Potassium 4.6 3.5 - 5.1 mmol/L   Chloride 96 (L) 101 - 111 mmol/L   CO2 20 (L) 22 - 32 mmol/L   Glucose, Bld 146 (H) 65 - 99 mg/dL   BUN 24 (H) 6 - 20 mg/dL   Creatinine, Ser 1.35 (H) 0.61 - 1.24 mg/dL   Calcium 8.9 8.9 - 10.3 mg/dL   GFR calc non Af Amer 46 (L) >60 mL/min   GFR calc Af Amer 54 (L) >60 mL/min   Anion gap 13 5 - 15  CBC     Status: Abnormal   Collection Time: 08/02/15 11:36 PM  Result Value Ref Range   WBC 12.2 (H) 4.0 - 10.5 K/uL   RBC 4.16 (L) 4.22 - 5.81 MIL/uL   Hemoglobin 11.8 (L) 13.0 - 17.0 g/dL   HCT 36.9 (L) 39.0 - 52.0 %   MCV 88.7 78.0 - 100.0 fL   MCH 28.4 26.0 - 34.0 pg   MCHC 32.0 30.0 - 36.0 g/dL   RDW 14.2 11.5 - 15.5 %   Platelets 208 150 - 400 K/uL  Brain natriuretic peptide     Status: Abnormal   Collection Time: 08/02/15 11:38 PM  Result Value Ref Range   B Natriuretic Peptide 1953.0 (H) 0.0 - 100.0 pg/mL  I-stat troponin, ED  (not at [facility], [facility])     Status: Abnormal    Collection Time: 08/02/15 11:44 PM  Result Value Ref Range   Troponin i, poc 0.21 (HH) 0.00 - 0.08 ng/mL   Comment NOTIFIED PHYSICIAN    Comment 3          I-Stat arterial blood gas, ED  ([facility], [facility])     Status: Abnormal   Collection Time: 08/02/15 11:51 PM  Result Value Ref Range   pH, Arterial 7.214 (L) 7.350 - 7.450   pCO2 arterial 54.9 (H) 35.0 - 45.0 mmHg   pO2, Arterial 104.0 (H) 80.0 - 100.0 mmHg   Bicarbonate 22.3 20.0 - 24.0 mEq/L   TCO2 24 0 - 100 mmol/L   O2 Saturation 97.0 %   Acid-base deficit 6.0 (H) 0.0 - 2.0 mmol/L  Patient temperature 97.9 F    Collection site RADIAL, ALLEN'S TEST ACCEPTABLE    Drawn by RT    Sample type ARTERIAL    Dg Chest Port 1 View  08/02/2015  CLINICAL DATA:  81 year old male with shortness of breath and abnormal pulmonary auscultation. Initial encounter. EXAM: PORTABLE CHEST 1 VIEW COMPARISON:  10/09/2013. FINDINGS: Portable AP semi upright view at 2329 hours. Diffuse pulmonary interstitial opacity. Cardiomegaly appears increased since 2014. Other mediastinal contours are within normal limits. Small right pleural effusion suspected. No pneumothorax. Patchy and confluent retrocardiac opacity, favor atelectasis. IMPRESSION: Acute pulmonary edema. Small right effusion. Cardiomegaly appears increased since 2014. Electronically Signed   By: Genevie Ann M.D.   On: 08/02/2015 23:39     ASSESSMENT: 81 yo man with HTN, HLD, CAD and PVD (s/p CEA and AAA repair) who presents with SOB most likely due to acute CHF.   PLAN/DISCUSSION: Admit to step down Continue BiPAP prn WOB Continue NTG drip Lasix given in ED, repeat and continue q 6 for aggressive diuresis Echocardiogram to assess LVF and valves (difficult to auscultate cardiac sounds over breath sounds).  Continue ASA, statin, BB, and ARB AKI and hyponatremia noted, at this time assumed to be due to above volume overload and thus will need to follow closely.

## 2015-08-03 NOTE — Progress Notes (Signed)
Utilization review completed. Jannette Cotham, RN, BSN. 

## 2015-08-03 NOTE — Care Management Note (Signed)
Case Management Note  Patient Details  Name: Henry Neal MRN: [id number] Date of Birth: 1927-10-16  Subjective/Objective:                    Action/Plan:   Expected Discharge Date:                  Expected Discharge Plan:     In-House Referral:     Discharge planning Services     Post Acute Care Choice:    Choice offered to:     DME Arranged:    DME Agency:     HH Arranged:    [place] Agency:     Status of Service:     Medicare Important Message Given:    Date Medicare IM Given:    Medicare IM give by:    Date Additional Medicare IM Given:    Additional Medicare Important Message give by:     If discussed at [place] of Stay Meetings, dates discussed:    Additional Comments:  Drusilla Kanner, RN 08/03/2015, 7:35 AM

## 2015-08-03 NOTE — Progress Notes (Signed)
Pt taken off BIPAP to assess. Pt now on 2L [place]. Sats 98%. Pt able to speak in complete sentences. RT instructed pt to take slow/deep breaths and to let us know if he began feeling like he did on admission. No Dyspnea, WOB normal. RN aware. RT will monitor.

## 2015-08-03 NOTE — Progress Notes (Signed)
Patient placed on BIPAP per MD order. Patient on 12/6 and 60%FIO2. Patient tolerating well. RT will continue to monitor.

## 2015-08-03 NOTE — Progress Notes (Signed)
MD paged about pt's low BP; ordered to start dopamine at 5.  Will continue to monitor closely and update as needed.

## 2015-08-03 NOTE — Progress Notes (Signed)
Pt c/o dizziness and feeling flushed, and tightness inbetween shoulder blades. EKG ordered. Will continue to monitor closely and update as needed.

## 2015-08-03 NOTE — Progress Notes (Signed)
  Echocardiogram 2D Echocardiogram has been performed.  Henry Neal 08/03/2015, 3:21 PM

## 2015-08-03 NOTE — Progress Notes (Signed)
ANTICOAGULATION CONSULT NOTE - Follow Up Consult  Pharmacy Consult for Heparin Indication: chest pain/ACS  Allergies  Allergen Reactions  . Sinequan [Doxepin Hcl] Other (See Comments)    hallucinations  . Verapamil Other (See Comments)    Caused heart to race  . Procaine Hcl Other (See Comments)    Tingling all over as soon as it was injected    Patient Measurements: Height: 5\' 10"  (177.8 cm) Weight: 190 lb 11.2 oz (86.5 kg) IBW/kg (Calculated) : 73 Heparin Dosing Weight:  86.5 kg  Vital Signs: Temp: 98.1 F (36.7 C) (10/12 1145) Temp Source: Oral (10/12 1145) BP: 81/32 mmHg (10/12 1330) Pulse Rate: 55 (10/12 1330)  Labs:  Recent Labs  08/02/15 2336 08/03/15 0520 08/03/15 1027 08/03/15 1408  HGB 11.8*  --   --   --   HCT 36.9*  --   --   --   PLT 208  --   --   --   HEPARINUNFRC  --   --   --  0.14*  CREATININE 1.35*  --  1.41*  --   CKTOTAL  --  128 105  --   CKMB  --  8.2* 7.4*  --   TROPONINI  --  0.42* 0.47*  --     Estimated Creatinine Clearance: 39.5 mL/min (by C-G formula based on Cr of 1.41).  Assessment: 81 y/o M with known CAD here with chest pain/shortness of breath, has mildly elevated troponin, Hgb 11.8, mild renal dysfunction, other labs/meds reviewed.   Anticoagulation: ACS on IV heparin. Heparin level 0.14 low.  Goal of Therapy:  Heparin level 0.3-0.7 units/ml Monitor platelets by anticoagulation protocol: Yes   Plan:  Bolus heparin 2500 units Increase heparin infusion to 1150 units/hr Will recheck heparin level in 8 hrs.   Henry Neal, PharmD, BCPS Clinical Staff Pharmacist Pager [phone]  Wayland Salinas 08/03/2015,2:39 PM

## 2015-08-04 ENCOUNTER — Encounter (HOSPITAL_COMMUNITY)
Admission: EM | Disposition: A | Discharge status: Home Health | Payer: Self-pay | Source: Home / Self Care | Attending: Cardiology

## 2015-08-04 ENCOUNTER — Encounter (HOSPITAL_COMMUNITY): Payer: Self-pay

## 2015-08-04 DIAGNOSIS — I509 Heart failure, unspecified: Secondary | ICD-10-CM

## 2015-08-04 DIAGNOSIS — I2510 Atherosclerotic heart disease of native coronary artery without angina pectoris: Secondary | ICD-10-CM

## 2015-08-04 DIAGNOSIS — I214 Non-ST elevation (NSTEMI) myocardial infarction: Secondary | ICD-10-CM | POA: Insufficient documentation

## 2015-08-04 HISTORY — PX: CARDIAC CATHETERIZATION: SHX172

## 2015-08-04 LAB — POCT I-STAT 7, (LYTES, BLD GAS, ICA,H+H)
ACID-BASE EXCESS: 3 mmol/L — AB (ref 0.0–2.0)
BICARBONATE: 27.4 meq/L — AB (ref 20.0–24.0)
CALCIUM ION: 1.09 mmol/L — AB (ref 1.13–1.30)
HCT: 31 % — ABNORMAL LOW (ref 39.0–52.0)
Hemoglobin: 10.5 g/dL — ABNORMAL LOW (ref 13.0–17.0)
O2 Saturation: 95 %
PH ART: 7.424 (ref 7.350–7.450)
PO2 ART: 76 mmHg — AB (ref 80.0–100.0)
Potassium: 3.2 mmol/L — ABNORMAL LOW (ref 3.5–5.1)
SODIUM: 134 mmol/L — AB (ref 135–145)
TCO2: 29 mmol/L (ref 0–100)
pCO2 arterial: 41.9 mmHg (ref 35.0–45.0)

## 2015-08-04 LAB — POCT ACTIVATED CLOTTING TIME: Activated Clotting Time: 128 seconds

## 2015-08-04 LAB — BASIC METABOLIC PANEL
Anion gap: 9 (ref 5–15)
BUN: 32 mg/dL — ABNORMAL HIGH (ref 6–20)
CO2: 28 mmol/L (ref 22–32)
Calcium: 8.4 mg/dL — ABNORMAL LOW (ref 8.9–10.3)
Chloride: 94 mmol/L — ABNORMAL LOW (ref 101–111)
Creatinine, Ser: 1.47 mg/dL — ABNORMAL HIGH (ref 0.61–1.24)
GFR calc Af Amer: 48 mL/min — ABNORMAL LOW (ref >60)
GFR calc non Af Amer: 42 mL/min — ABNORMAL LOW (ref >60)
Glucose, Bld: 104 mg/dL — ABNORMAL HIGH (ref 65–99)
Potassium: 3.7 mmol/L (ref 3.5–5.1)
Sodium: 131 mmol/L — ABNORMAL LOW (ref 135–145)

## 2015-08-04 LAB — HEMOGLOBIN A1C
HEMOGLOBIN A1C: 6.2 % — AB (ref 4.8–5.6)
Mean Plasma Glucose: 131 mg/dL

## 2015-08-04 LAB — CBC
HCT: 31.3 % — ABNORMAL LOW (ref 39.0–52.0)
Hemoglobin: 9.9 g/dL — ABNORMAL LOW (ref 13.0–17.0)
MCH: 27.8 pg (ref 26.0–34.0)
MCHC: 31.6 g/dL (ref 30.0–36.0)
MCV: 87.9 fL (ref 78.0–100.0)
Platelets: 167 10*3/uL (ref 150–400)
RBC: 3.56 MIL/uL — ABNORMAL LOW (ref 4.22–5.81)
RDW: 14.1 % (ref 11.5–15.5)
WBC: 8.1 10*3/uL (ref 4.0–10.5)

## 2015-08-04 LAB — HEPARIN LEVEL (UNFRACTIONATED): HEPARIN UNFRACTIONATED: 0.4 [IU]/mL (ref 0.30–0.70)

## 2015-08-04 LAB — PROTIME-INR
INR: 1.38 (ref 0.00–1.49)
Prothrombin Time: 17 seconds — ABNORMAL HIGH (ref 11.6–15.2)

## 2015-08-04 SURGERY — RIGHT/LEFT HEART CATH AND CORONARY ANGIOGRAPHY

## 2015-08-04 MED ORDER — ASPIRIN 81 MG PO CHEW
81.0000 mg | CHEWABLE_TABLET | Freq: Every day | ORAL | Status: DC
  Administered 2015-08-05 – 2015-08-09 (×4): 81 mg via ORAL
  Filled 2015-08-04 (×5): qty 1

## 2015-08-04 MED ORDER — FENTANYL CITRATE (PF) 100 MCG/2ML IJ SOLN
INTRAMUSCULAR | Status: AC
  Filled 2015-08-04: qty 4

## 2015-08-04 MED ORDER — IOHEXOL 350 MG/ML SOLN
INTRAVENOUS | Status: DC | PRN
  Administered 2015-08-04: 100 mL via INTRAVENOUS

## 2015-08-04 MED ORDER — MIDAZOLAM HCL 2 MG/2ML IJ SOLN
INTRAMUSCULAR | Status: AC
  Filled 2015-08-04: qty 4

## 2015-08-04 MED ORDER — SODIUM CHLORIDE 0.9 % IJ SOLN: 3.0000 mL | INTRAMUSCULAR | Status: DC | PRN

## 2015-08-04 MED ORDER — FENTANYL CITRATE (PF) 100 MCG/2ML IJ SOLN
INTRAMUSCULAR | Status: DC | PRN
  Administered 2015-08-04: 25 ug

## 2015-08-04 MED ORDER — SODIUM CHLORIDE 0.9 % IJ SOLN
3.0000 mL | Freq: Two times a day (BID) | INTRAMUSCULAR | Status: DC
  Administered 2015-08-04: 3 mL via INTRAVENOUS

## 2015-08-04 MED ORDER — SODIUM CHLORIDE 0.9 % IV SOLN: INTRAVENOUS | Status: DC

## 2015-08-04 MED ORDER — HEPARIN (PORCINE) IN NACL 100-0.45 UNIT/ML-% IJ SOLN: 1100.0000 [IU]/h | INTRAMUSCULAR | Status: DC

## 2015-08-04 MED ORDER — ATROPINE SULFATE 0.1 MG/ML IJ SOLN
INTRAMUSCULAR | Status: AC
  Filled 2015-08-04: qty 10

## 2015-08-04 MED ORDER — ASPIRIN 81 MG PO CHEW
81.0000 mg | CHEWABLE_TABLET | ORAL | Status: AC
  Administered 2015-08-04: 81 mg via ORAL
  Filled 2015-08-04: qty 1

## 2015-08-04 MED ORDER — SODIUM CHLORIDE 0.9 % IV SOLN
250.0000 mg | INTRAVENOUS | Status: DC | PRN
  Administered 2015-08-04: 1.75 mg/kg/h via INTRAVENOUS

## 2015-08-04 MED ORDER — CLOPIDOGREL BISULFATE 300 MG PO TABS
ORAL_TABLET | ORAL | Status: DC | PRN
  Administered 2015-08-04: 600 mg via ORAL

## 2015-08-04 MED ORDER — CLOPIDOGREL BISULFATE 300 MG PO TABS
ORAL_TABLET | ORAL | Status: AC
  Filled 2015-08-04: qty 2

## 2015-08-04 MED ORDER — SODIUM CHLORIDE 0.9 % IJ SOLN
3.0000 mL | Freq: Two times a day (BID) | INTRAMUSCULAR | Status: DC
  Administered 2015-08-04 – 2015-08-07 (×7): 3 mL via INTRAVENOUS

## 2015-08-04 MED ORDER — CLOPIDOGREL BISULFATE 75 MG PO TABS
75.0000 mg | ORAL_TABLET | Freq: Every day | ORAL | Status: DC
  Administered 2015-08-05 – 2015-08-09 (×5): 75 mg via ORAL
  Filled 2015-08-04 (×5): qty 1

## 2015-08-04 MED ORDER — SODIUM CHLORIDE 0.9 % IV SOLN: 250.0000 mL | INTRAVENOUS | Status: DC | PRN

## 2015-08-04 MED ORDER — LIDOCAINE HCL (PF) 1 % IJ SOLN
INTRAMUSCULAR | Status: AC
  Filled 2015-08-04: qty 30

## 2015-08-04 MED ORDER — BIVALIRUDIN 250 MG IV SOLR
INTRAVENOUS | Status: AC
  Filled 2015-08-04: qty 250

## 2015-08-04 MED ORDER — LIDOCAINE HCL (PF) 1 % IJ SOLN
INTRAMUSCULAR | Status: DC | PRN
  Administered 2015-08-04: 17:00:00

## 2015-08-04 MED ORDER — MIDAZOLAM HCL 2 MG/2ML IJ SOLN
INTRAMUSCULAR | Status: DC | PRN
  Administered 2015-08-04 (×2): 1 mg

## 2015-08-04 MED ORDER — BIVALIRUDIN BOLUS VIA INFUSION - CUPID
INTRAVENOUS | Status: DC | PRN
  Administered 2015-08-04: 62.7 mg via INTRAVENOUS

## 2015-08-04 MED ORDER — SODIUM CHLORIDE 0.9 % IV SOLN
INTRAVENOUS | Status: AC
  Administered 2015-08-04: 18:00:00 via INTRAVENOUS

## 2015-08-04 SURGICAL SUPPLY — 21 items
BALLN EUPHORA RX 2.5X12 (BALLOONS) ×4
BALLN ~~LOC~~ EMERGE MR 4.0X12 (BALLOONS) ×2
BALLOON EUPHORA RX 2.5X12 (BALLOONS) IMPLANT
BALLOON ~~LOC~~ EMERGE MR 4.0X12 (BALLOONS) IMPLANT
CATH INFINITI 5 FR MPA2 (CATHETERS) IMPLANT
CATH INFINITI 5FR MULTPACK ANG (CATHETERS) ×1 IMPLANT
CATH SWAN GANZ 7F STRAIGHT (CATHETERS) ×2 IMPLANT
GUIDE CATH RUNWAY 6FR FR4 (CATHETERS) ×1 IMPLANT
KIT ENCORE 26 ADVANTAGE (KITS) ×1 IMPLANT
KIT HEART LEFT (KITS) ×2 IMPLANT
KIT HEART RIGHT NAMIC (KITS) ×2 IMPLANT
PACK CARDIAC CATHETERIZATION (CUSTOM PROCEDURE TRAY) ×2 IMPLANT
SHEATH PINNACLE 5F 10CM (SHEATH) ×1 IMPLANT
SHEATH PINNACLE 6F 10CM (SHEATH) ×1 IMPLANT
SHEATH PINNACLE 7F 10CM (SHEATH) ×1 IMPLANT
STENT PROMUS PREM MR 4.0X16 (Permanent Stent) ×1 IMPLANT
SYR MEDRAD MARK V 150ML (SYRINGE) ×2 IMPLANT
TRANSDUCER W/STOPCOCK (MISCELLANEOUS) ×4 IMPLANT
WIRE COUGAR XT STRL 190CM (WIRE) ×1 IMPLANT
WIRE EMERALD 3MM-J .035X150CM (WIRE) ×2 IMPLANT
WIRE EMERALD 3MM-J .035X260CM (WIRE) ×1 IMPLANT

## 2015-08-04 NOTE — Progress Notes (Signed)
ANTICOAGULATION CONSULT NOTE - Follow Up Consult  Pharmacy Consult for Heparin Indication: ACS and LV mural thrombus  Allergies  Allergen Reactions  . Sinequan [Doxepin Hcl] Other (See Comments)    hallucinations  . Verapamil Other (See Comments)    Caused heart to race  . Procaine Hcl Other (See Comments)    Tingling all over as soon as it was injected    Patient Measurements: Height: 5\' 10"  (177.8 cm) Weight: 184 lb 4.9 oz (83.6 kg) IBW/kg (Calculated) : 73 Heparin Dosing Weight:  86.5 kg  Vital Signs: Temp: 97.5 F (36.4 C) (10/13 0734) Temp Source: Oral (10/13 0734) BP: 121/53 mmHg (10/13 0734) Pulse Rate: 65 (10/13 0734)  Labs:  Recent Labs  08/02/15 2336 08/03/15 0520 08/03/15 1027 08/03/15 1408 08/03/15 1545 08/03/15 2301 08/04/15 0255  HGB 11.8*  --   --   --   --   --  9.9*  HCT 36.9*  --   --   --   --   --  31.3*  PLT 208  --   --   --   --   --  167  HEPARINUNFRC  --   --   --  0.14*  --  0.43 0.40  CREATININE 1.35*  --  1.41*  --   --   --  1.47*  CKTOTAL  --  128 105  --  97  --   --   CKMB  --  8.2* 7.4*  --  5.5*  --   --   TROPONINI  --  0.42* 0.47*  --  0.45*  --   --     Estimated Creatinine Clearance: 37.9 mL/min (by C-G formula based on Cr of 1.47).  Assessment: 81 y/o M with known CAD here with chest pain/shortness of breath, has mildly elevated troponin and now suspected LV mural Thrombus  Anticoagulation: ACS/LV Thrombus on IV heparin. Heparin level 0.4 on 1150 units/hr  Goal of Therapy:  Heparin level 0.3-0.7 units/ml Monitor platelets by anticoagulation protocol: Yes   Plan:   - continue heparin infusion at 1150 units/hr - daily HL - f/u on long term anti-coag plans  Vincenza Hews, PharmD, BCPS 08/04/2015, 10:02 AM Pager: [phone]

## 2015-08-04 NOTE — Interval H&P Note (Signed)
Cath Lab Visit (complete for each Cath Lab visit)  Clinical Evaluation Leading to the Procedure:   ACS: Yes.    Non-ACS:    Anginal Classification: CCS IV  Anti-ischemic medical therapy: Minimal Therapy (1 class of medications)  Non-Invasive Test Results: No non-invasive testing performed  Prior CABG: No previous CABG      History and Physical Interval Note:  08/04/2015 3:33 PM  Henry Neal  has presented today for surgery, with the diagnosis of cp  The various methods of treatment have been discussed with the patient and family. After consideration of risks, benefits and other options for treatment, the patient has consented to  Procedure(s): Right/Left Heart Cath and Coronary Angiography (N/A) as a surgical intervention .  The patient's history has been reviewed, patient examined, no change in status, stable for surgery.  I have reviewed the patient's chart and labs.  Questions were answered to the patient's satisfaction.     Emilynn Srinivasan [employer]

## 2015-08-04 NOTE — Progress Notes (Signed)
Patient ID: Henry Neal, male   DOB: 11/12/1926, 81 y.o.   MRN: [id number]     SUBJECTIVE: Henry Neal went to the ER in the evening of 10/11 with profound dyspnea and epigastric discomfort.  He says that he has been short of breath with exertion for a long time, but got profoundly worse that evening.  The dyspnea was more prominent than the epigastric pain.  He came to the ER and was put on Bipap, IV NTG, and was diuresed.  CXR showed pulmonary edema.  Echo showed EF down to 15% with possible apical thrombus.   In the hospital, he has diuresed well.  No chest or epigastric pain. Breathing better.  Troponin stably elevated at about 0.4 without trend.  BNP elevated.    Scheduled Meds: . antiseptic oral rinse  7 mL Mouth Rinse BID  . aspirin  81 mg Oral Daily  . atorvastatin  40 mg Oral QHS  . chlorhexidine  15 mL Mouth Rinse BID  . finasteride  5 mg Oral Daily  . furosemide  40 mg Intravenous 3 times per day  . gabapentin  300 mg Oral BID  . isosorbide mononitrate  30 mg Oral Daily  . pantoprazole  40 mg Oral Daily   Continuous Infusions: . DOPamine Stopped (08/03/15 1251)  . heparin 1,150 Units/hr (08/03/15 2229)  . nitroGLYCERIN 5 mcg/min (08/03/15 0011)   PRN Meds:.acetaminophen, alum & mag hydroxide-simeth, ondansetron (ZOFRAN) IV, ondansetron, senna    Filed Vitals:   08/04/15 0350 08/04/15 0403 08/04/15 0533 08/04/15 0734  BP: 112/41  115/46 121/53  Pulse: 66  55 65  Temp: 97.5 F (36.4 C)   97.5 F (36.4 C)  TempSrc: Oral   Oral  Resp: 23  11 13   Height:      Weight:  184 lb 4.9 oz (83.6 kg)    SpO2: 94%  97% 97%    Intake/Output Summary (Last 24 hours) at 08/04/15 0849 Last data filed at 08/04/15 0730  Gross per 24 hour  Intake  683.1 ml  Output   3650 ml  Net -2966.9 ml    LABS: Basic Metabolic Panel:  Recent Labs  08/03/15 1027 08/04/15 0255  NA 132* 131*  K 3.5 3.7  CL 94* 94*  CO2 27 28  GLUCOSE 136* 104*  BUN 25* 32*  CREATININE 1.41* 1.47*    CALCIUM 8.9 8.4*   Liver Function Tests: No results for input(s): AST, ALT, ALKPHOS, BILITOT, PROT, ALBUMIN in the last 72 hours. No results for input(s): LIPASE, AMYLASE in the last 72 hours. CBC:  Recent Labs  08/02/15 2336 08/04/15 0255  WBC 12.2* 8.1  HGB 11.8* 9.9*  HCT 36.9* 31.3*  MCV 88.7 87.9  PLT 208 167   Cardiac Enzymes:  Recent Labs  08/03/15 0520 08/03/15 1027 08/03/15 1545  CKTOTAL 128 105 97  CKMB 8.2* 7.4* 5.5*  TROPONINI 0.42* 0.47* 0.45*   BNP: Invalid input(s): POCBNP D-Dimer: No results for input(s): DDIMER in the last 72 hours. Hemoglobin A1C:  Recent Labs  08/03/15 0520  HGBA1C 6.2*   Fasting Lipid Panel:  Recent Labs  08/03/15 0520  CHOL 105  HDL 44  LDLCALC 51  TRIG 49  CHOLHDL 2.4   Thyroid Function Tests:  Recent Labs  08/03/15 0520  TSH 0.867   Anemia Panel: No results for input(s): VITAMINB12, FOLATE, FERRITIN, TIBC, IRON, RETICCTPCT in the last 72 hours.  RADIOLOGY: Dg Chest Port 1 View  08/02/2015  CLINICAL DATA:  81 year old male with shortness of breath and abnormal pulmonary auscultation. Initial encounter. EXAM: PORTABLE CHEST 1 VIEW COMPARISON:  10/09/2013. FINDINGS: Portable AP semi upright view at 2329 hours. Diffuse pulmonary interstitial opacity. Cardiomegaly appears increased since 2014. Other mediastinal contours are within normal limits. Small right pleural effusion suspected. No pneumothorax. Patchy and confluent retrocardiac opacity, favor atelectasis. IMPRESSION: Acute pulmonary edema. Small right effusion. Cardiomegaly appears increased since 2014. Electronically Signed   By: Genevie Ann M.D.   On: 08/02/2015 23:39    PHYSICAL EXAM General: NAD Neck: JVP 8 cm, no thyromegaly or thyroid nodule.  Lungs: Mildly decreased breath sounds bilaterally.  CV: Nondisplaced PMI.  Heart regular S1/S2, no S3/S4, no murmur.  No peripheral edema.  No carotid bruit.   Abdomen: Soft, nontender, no hepatosplenomegaly, no  distention.  Neurologic: Alert and oriented x 3.  Psych: Normal affect. Extremities: No clubbing or cyanosis.   TELEMETRY: Reviewed telemetry pt in NSR, LBBB  ASSESSMENT AND PLAN: 81 yo with history of AAA s/p repair, known CAD, prior R CEA, and chronic LBBB presented with acute onset severe dyspnea and epigstric pain.  He has had some exertional dyspnea long-term. He was found to have acute pulmonary edema on CXR and has been diuresed.  Echo with EF 15%.  1. Acute on ?chronic systolic CHF:  Acute pulmonary edema at admission.  Echo this admission with EF 15% and multiple wall motion abnormalities.  Last prior assessment of EF was by LV-gram in 12/15, estimated 50%.  No recent prior echo.  He has had exertional dyspnea for a while now, but developed fairly acute worsening of dyspnea with exertion => dyspnea at rest in the evening of 10/11.  He initially required Bipap, now off.  He has diuresed well.  Volume status actually appears pretty good today and his breathing is improved. BP stable.  - I think that we can hold IV Lasix at this point.  - He will need RHC to assess left/right heart filling pressures and cardiac output.  - ? Cause of exacerbation => did he gradually build up fluid overload and finally become symptomatic (has had exertional dyspnea for a long time) or did he have a new ischemic event triggering ischemic diastolic dysfunction and flash pulmonary edmea.  Will do coronary angiography as well later today to assess for new disease.  - He will need to be gradually started on cardiac meds (ACEI, beta blocker).  Will need to be careful with elevated creatinine.  Will decide on regimen after cath.  2. CAD: Known CAD, LHC in 12/15 with total occlusion mLAD, total occlusion OM1, 50-60% pRCA, 70% PDA.  He presented with acute pulmonary edema and some epigastric pain.  Lack of troponin trend (steady at 0.4) points away from large new event (MI) though concerned that ischemia could play a role  in his flash pulmonary edema.   - Coronary angiography later today.  Will hold further Lasix.  He can have a clear liquid breakfast.  We discussed risks/benefits of procedure and he agrees to proceed.  Will need to be very careful with contrast given elevated creatinine.  He has an allergy listed to verapamil.  However, this was many years ago and he said all that happened was that his heart raced after getting po verapamil.  I wonder if this was not the reason he was given the verapamil in the first place but he does not remember.  I think that he would be safe for verapamil as this does  not appear to be true allergy with rash/anaphylaxis or excessive bradycardia.  - Continue ASA and statin.  3. Possible LV mural thrombus: Continue anticoagulation, will review echo.  He will not be getting LV-gram.  4. CKD: Creatinine 1.4, last prior was 0.9 1 year ago, so no sure of baseline.  Holding Lasix now, will be very careful with contrast.   40 minutes critical care time  Loralie Champagne 08/04/2015 9:06 AM

## 2015-08-04 NOTE — Progress Notes (Signed)
Patient c/o ongoing pain to his hips and upper back, stating that this is not a new pain. Tylenol administered 1 1/2 hrs ago but no relief. Patient stated that at home he has hydrocodone that he sometimes takes, but this is not noted in his home meds. MD notified, order for ultram 25mg  obtained, will administer and continue to monitor.

## 2015-08-04 NOTE — H&P (View-Only) (Signed)
Patient ID: NEYTHAN KOZLOV, male   DOB: 10/26/1926, 81 y.o.   MRN: [id number]     SUBJECTIVE: Mr Maffei went to the ER in the evening of 10/11 with profound dyspnea and epigastric discomfort.  He says that he has been short of breath with exertion for a long time, but got profoundly worse that evening.  The dyspnea was more prominent than the epigastric pain.  He came to the ER and was put on Bipap, IV NTG, and was diuresed.  CXR showed pulmonary edema.  Echo showed EF down to 15% with possible apical thrombus.   In the hospital, he has diuresed well.  No chest or epigastric pain. Breathing better.  Troponin stably elevated at about 0.4 without trend.  BNP elevated.    Scheduled Meds: . antiseptic oral rinse  7 mL Mouth Rinse BID  . aspirin  81 mg Oral Daily  . atorvastatin  40 mg Oral QHS  . chlorhexidine  15 mL Mouth Rinse BID  . finasteride  5 mg Oral Daily  . furosemide  40 mg Intravenous 3 times per day  . gabapentin  300 mg Oral BID  . isosorbide mononitrate  30 mg Oral Daily  . pantoprazole  40 mg Oral Daily   Continuous Infusions: . DOPamine Stopped (08/03/15 1251)  . heparin 1,150 Units/hr (08/03/15 2229)  . nitroGLYCERIN 5 mcg/min (08/03/15 0011)   PRN Meds:.acetaminophen, alum & mag hydroxide-simeth, ondansetron (ZOFRAN) IV, ondansetron, senna    Filed Vitals:   08/04/15 0350 08/04/15 0403 08/04/15 0533 08/04/15 0734  BP: 112/41  115/46 121/53  Pulse: 66  55 65  Temp: 97.5 F (36.4 C)   97.5 F (36.4 C)  TempSrc: Oral   Oral  Resp: 23  11 13   Height:      Weight:  184 lb 4.9 oz (83.6 kg)    SpO2: 94%  97% 97%    Intake/Output Summary (Last 24 hours) at 08/04/15 0849 Last data filed at 08/04/15 0730  Gross per 24 hour  Intake  683.1 ml  Output   3650 ml  Net -2966.9 ml    LABS: Basic Metabolic Panel:  Recent Labs  08/03/15 1027 08/04/15 0255  NA 132* 131*  K 3.5 3.7  CL 94* 94*  CO2 27 28  GLUCOSE 136* 104*  BUN 25* 32*  CREATININE 1.41* 1.47*    CALCIUM 8.9 8.4*   Liver Function Tests: No results for input(s): AST, ALT, ALKPHOS, BILITOT, PROT, ALBUMIN in the last 72 hours. No results for input(s): LIPASE, AMYLASE in the last 72 hours. CBC:  Recent Labs  08/02/15 2336 08/04/15 0255  WBC 12.2* 8.1  HGB 11.8* 9.9*  HCT 36.9* 31.3*  MCV 88.7 87.9  PLT 208 167   Cardiac Enzymes:  Recent Labs  08/03/15 0520 08/03/15 1027 08/03/15 1545  CKTOTAL 128 105 97  CKMB 8.2* 7.4* 5.5*  TROPONINI 0.42* 0.47* 0.45*   BNP: Invalid input(s): POCBNP D-Dimer: No results for input(s): DDIMER in the last 72 hours. Hemoglobin A1C:  Recent Labs  08/03/15 0520  HGBA1C 6.2*   Fasting Lipid Panel:  Recent Labs  08/03/15 0520  CHOL 105  HDL 44  LDLCALC 51  TRIG 49  CHOLHDL 2.4   Thyroid Function Tests:  Recent Labs  08/03/15 0520  TSH 0.867   Anemia Panel: No results for input(s): VITAMINB12, FOLATE, FERRITIN, TIBC, IRON, RETICCTPCT in the last 72 hours.  RADIOLOGY: Dg Chest Port 1 View  08/02/2015  CLINICAL DATA:  81 year old male with shortness of breath and abnormal pulmonary auscultation. Initial encounter. EXAM: PORTABLE CHEST 1 VIEW COMPARISON:  10/09/2013. FINDINGS: Portable AP semi upright view at 2329 hours. Diffuse pulmonary interstitial opacity. Cardiomegaly appears increased since 2014. Other mediastinal contours are within normal limits. Small right pleural effusion suspected. No pneumothorax. Patchy and confluent retrocardiac opacity, favor atelectasis. IMPRESSION: Acute pulmonary edema. Small right effusion. Cardiomegaly appears increased since 2014. Electronically Signed   By: Genevie Ann M.D.   On: 08/02/2015 23:39    PHYSICAL EXAM General: NAD Neck: JVP 8 cm, no thyromegaly or thyroid nodule.  Lungs: Mildly decreased breath sounds bilaterally.  CV: Nondisplaced PMI.  Heart regular S1/S2, no S3/S4, no murmur.  No peripheral edema.  No carotid bruit.   Abdomen: Soft, nontender, no hepatosplenomegaly, no  distention.  Neurologic: Alert and oriented x 3.  Psych: Normal affect. Extremities: No clubbing or cyanosis.   TELEMETRY: Reviewed telemetry pt in NSR, LBBB  ASSESSMENT AND PLAN: 81 yo with history of AAA s/p repair, known CAD, prior R CEA, and chronic LBBB presented with acute onset severe dyspnea and epigstric pain.  He has had some exertional dyspnea long-term. He was found to have acute pulmonary edema on CXR and has been diuresed.  Echo with EF 15%.  1. Acute on ?chronic systolic CHF:  Acute pulmonary edema at admission.  Echo this admission with EF 15% and multiple wall motion abnormalities.  Last prior assessment of EF was by LV-gram in 12/15, estimated 50%.  No recent prior echo.  He has had exertional dyspnea for a while now, but developed fairly acute worsening of dyspnea with exertion => dyspnea at rest in the evening of 10/11.  He initially required Bipap, now off.  He has diuresed well.  Volume status actually appears pretty good today and his breathing is improved. BP stable.  - I think that we can hold IV Lasix at this point.  - He will need RHC to assess left/right heart filling pressures and cardiac output.  - ? Cause of exacerbation => did he gradually build up fluid overload and finally become symptomatic (has had exertional dyspnea for a long time) or did he have a new ischemic event triggering ischemic diastolic dysfunction and flash pulmonary edmea.  Will do coronary angiography as well later today to assess for new disease.  - He will need to be gradually started on cardiac meds (ACEI, beta blocker).  Will need to be careful with elevated creatinine.  Will decide on regimen after cath.  2. CAD: Known CAD, LHC in 12/15 with total occlusion mLAD, total occlusion OM1, 50-60% pRCA, 70% PDA.  He presented with acute pulmonary edema and some epigastric pain.  Lack of troponin trend (steady at 0.4) points away from large new event (MI) though concerned that ischemia could play a role  in his flash pulmonary edema.   - Coronary angiography later today.  Will hold further Lasix.  He can have a clear liquid breakfast.  We discussed risks/benefits of procedure and he agrees to proceed.  Will need to be very careful with contrast given elevated creatinine.  He has an allergy listed to verapamil.  However, this was many years ago and he said all that happened was that his heart raced after getting po verapamil.  I wonder if this was not the reason he was given the verapamil in the first place but he does not remember.  I think that he would be safe for verapamil as this does  not appear to be true allergy with rash/anaphylaxis or excessive bradycardia.  - Continue ASA and statin.  3. Possible LV mural thrombus: Continue anticoagulation, will review echo.  He will not be getting LV-gram.  4. CKD: Creatinine 1.4, last prior was 0.9 1 year ago, so no sure of baseline.  Holding Lasix now, will be very careful with contrast.   40 minutes critical care time  Loralie Champagne 08/04/2015 9:06 AM

## 2015-08-04 NOTE — Progress Notes (Signed)
ANTICOAGULATION CONSULT NOTE - Follow Up Consult  Pharmacy Consult for Heparin Indication: chest pain/ACS  Allergies  Allergen Reactions  . Sinequan [Doxepin Hcl] Other (See Comments)    hallucinations  . Verapamil Other (See Comments)    Caused heart to race  . Procaine Hcl Other (See Comments)    Tingling all over as soon as it was injected    Patient Measurements: Height: 5\' 10"  (177.8 cm) Weight: 184 lb 4.9 oz (83.6 kg) IBW/kg (Calculated) : 73 Heparin Dosing Weight:  86.5 kg  Vital Signs: Temp: 97.6 F (36.4 C) (10/13 1210) Temp Source: Oral (10/13 1210) BP: 109/50 mmHg (10/13 1830) Pulse Rate: 56 (10/13 1830)  Labs:  Recent Labs  08/02/15 2336 08/03/15 0520 08/03/15 1027 08/03/15 1408 08/03/15 1545 08/03/15 2301 08/04/15 0255 08/04/15 1210  HGB 11.8*  --   --   --   --   --  9.9*  --   HCT 36.9*  --   --   --   --   --  31.3*  --   PLT 208  --   --   --   --   --  167  --   LABPROT  --   --   --   --   --   --   --  17.0*  INR  --   --   --   --   --   --   --  1.38  HEPARINUNFRC  --   --   --  0.14*  --  0.43 0.40  --   CREATININE 1.35*  --  1.41*  --   --   --  1.47*  --   CKTOTAL  --  128 105  --  97  --   --   --   CKMB  --  8.2* 7.4*  --  5.5*  --   --   --   TROPONINI  --  0.42* 0.47*  --  0.45*  --   --   --     Estimated Creatinine Clearance: 37.9 mL/min (by C-G formula based on Cr of 1.47).  Assessment: 81 y/o M with known CAD here with chest pain/shortness of breath, has mildly elevated troponin, Hgb 11.8, mild renal dysfunction, other labs/meds reviewed.   S/P cath with PCI to RCA, possible LV thrombus restart heparin 8hr after sheath pulled.  Sheath out 1700  Goal of Therapy:  Heparin level 0.3-0.7 units/ml Monitor platelets by anticoagulation protocol: Yes   Plan:   heparin infusion to 110 units/hr - start at 0100 10/14 check heparin level in 6 hrs. Daily  HL, CBC Bonnita Nasuti Pharm.D. CPP, BCPS Clinical  Pharmacist [phone] 08/04/2015 7:01 PM

## 2015-08-05 ENCOUNTER — Encounter (HOSPITAL_COMMUNITY): Payer: Self-pay

## 2015-08-05 ENCOUNTER — Inpatient Hospital Stay (HOSPITAL_COMMUNITY): Payer: Medicare Other

## 2015-08-05 ENCOUNTER — Encounter (HOSPITAL_COMMUNITY): Payer: Self-pay | Admitting: Cardiology

## 2015-08-05 DIAGNOSIS — I509 Heart failure, unspecified: Secondary | ICD-10-CM

## 2015-08-05 LAB — CBC
HEMATOCRIT: 31.9 % — AB (ref 39.0–52.0)
Hemoglobin: 10.2 g/dL — ABNORMAL LOW (ref 13.0–17.0)
MCH: 28.6 pg (ref 26.0–34.0)
MCHC: 32 g/dL (ref 30.0–36.0)
MCV: 89.4 fL (ref 78.0–100.0)
Platelets: 165 10*3/uL (ref 150–400)
RBC: 3.57 MIL/uL — AB (ref 4.22–5.81)
RDW: 14.3 % (ref 11.5–15.5)
WBC: 8.2 10*3/uL (ref 4.0–10.5)

## 2015-08-05 LAB — BASIC METABOLIC PANEL
Anion gap: 9 (ref 5–15)
BUN: 24 mg/dL — ABNORMAL HIGH (ref 6–20)
CHLORIDE: 95 mmol/L — AB (ref 101–111)
CO2: 30 mmol/L (ref 22–32)
Calcium: 8.6 mg/dL — ABNORMAL LOW (ref 8.9–10.3)
Creatinine, Ser: 1.32 mg/dL — ABNORMAL HIGH (ref 0.61–1.24)
GFR calc non Af Amer: 47 mL/min — ABNORMAL LOW (ref >60)
GFR, EST AFRICAN AMERICAN: 55 mL/min — AB (ref >60)
Glucose, Bld: 156 mg/dL — ABNORMAL HIGH (ref 65–99)
POTASSIUM: 3.4 mmol/L — AB (ref 3.5–5.1)
SODIUM: 134 mmol/L — AB (ref 135–145)

## 2015-08-05 LAB — POCT ACTIVATED CLOTTING TIME: Activated Clotting Time: 509 seconds

## 2015-08-05 LAB — POCT I-STAT 3, ART BLOOD GAS (G3+)
Acid-Base Excess: 4 mmol/L — ABNORMAL HIGH (ref 0.0–2.0)
BICARBONATE: 30.2 meq/L — AB (ref 20.0–24.0)
O2 SAT: 89 %
PCO2 ART: 54 mmHg — AB (ref 35.0–45.0)
PO2 ART: 60 mmHg — AB (ref 80.0–100.0)
TCO2: 32 mmol/L (ref 0–100)
pH, Arterial: 7.355 (ref 7.350–7.450)

## 2015-08-05 LAB — HEPARIN LEVEL (UNFRACTIONATED): Heparin Unfractionated: 0.23 IU/mL — ABNORMAL LOW (ref 0.30–0.70)

## 2015-08-05 LAB — POCT I-STAT 3, VENOUS BLOOD GAS (G3P V)
ACID-BASE EXCESS: 5 mmol/L — AB (ref 0.0–2.0)
BICARBONATE: 30.5 meq/L — AB (ref 20.0–24.0)
O2 SAT: 60 %
PO2 VEN: 32 mmHg (ref 30.0–45.0)
TCO2: 32 mmol/L (ref 0–100)
pCO2, Ven: 49.8 mmHg (ref 45.0–50.0)
pH, Ven: 7.395 — ABNORMAL HIGH (ref 7.250–7.300)

## 2015-08-05 MED ORDER — LISINOPRIL 5 MG PO TABS
2.5000 mg | ORAL_TABLET | Freq: Every day | ORAL | Status: DC
  Administered 2015-08-05 – 2015-08-08 (×4): 2.5 mg via ORAL
  Filled 2015-08-05 (×5): qty 1

## 2015-08-05 MED ORDER — HEPARIN (PORCINE) IN NACL 100-0.45 UNIT/ML-% IJ SOLN
1200.0000 [IU]/h | INTRAMUSCULAR | Status: DC
  Administered 2015-08-05: 1100 [IU]/h via INTRAVENOUS
  Filled 2015-08-05: qty 250

## 2015-08-05 MED ORDER — HEPARIN SODIUM (PORCINE) 5000 UNIT/ML IJ SOLN
5000.0000 [IU] | Freq: Three times a day (TID) | INTRAMUSCULAR | Status: DC
  Administered 2015-08-05 – 2015-08-09 (×9): 5000 [IU] via SUBCUTANEOUS
  Filled 2015-08-05 (×9): qty 1

## 2015-08-05 MED ORDER — POTASSIUM CHLORIDE CRYS ER 20 MEQ PO TBCR
20.0000 meq | EXTENDED_RELEASE_TABLET | Freq: Once | ORAL | Status: AC
  Administered 2015-08-05: 20 meq via ORAL
  Filled 2015-08-05: qty 1

## 2015-08-05 MED ORDER — GADOBENATE DIMEGLUMINE 529 MG/ML IV SOLN
30.0000 mL | Freq: Once | INTRAVENOUS | Status: AC | PRN
  Administered 2015-08-05: 30 mL via INTRAVENOUS

## 2015-08-05 NOTE — Progress Notes (Signed)
Pt transferred from [facility]; received report from Tammy; VSS; on telemetry box #[id number]; CCMD notified/  Pt in no acute distress.

## 2015-08-05 NOTE — Clinical Documentation Improvement (Signed)
Cardiology  Can the diagnosis of CKD be further specified?   CKD Stage I - GFR greater than or equal to 90  CKD Stage II - GFR 60-89  CKD Stage III - GFR 30-59  CKD Stage IV - GFR 15-29  CKD Stage V - GFR < 15  ESRD (End Stage Renal Disease)  Other condition  Unable to clinically determine   Supporting Information: : (risk factors, signs and symptoms, diagnostics, treatment) 08/04/15: GFR= 42 08/05/15: GFR= 47  Please exercise your independent, professional judgment when responding. A specific answer is not anticipated or expected.   Thank You, Rolm Gala, RN, [place] [phone]

## 2015-08-05 NOTE — Progress Notes (Signed)
Report completed with RN Sabrina on unit 3East, pt will transfer to room 3E28. Pt informed of new room number and he will contact his family with new room number.

## 2015-08-05 NOTE — Progress Notes (Signed)
Pt transferred via wheelchair with belongings to 3E28.

## 2015-08-05 NOTE — Progress Notes (Signed)
Pt has completed cardiac MRI today, spoke with Amy, NP before placing transfer request in computer, pt held until cardiac MRI completed today to ensure no need to remain in stepdown bed status pending result of MRI.  Amy, NP okay with pt transferring out to telemetry at this time. Cardiac rehab just completed working with pt, see CR notes.

## 2015-08-05 NOTE — Progress Notes (Addendum)
Patient ID: Henry Neal, male   DOB: 1927/05/11, 81 y.o.   MRN: [id number]     SUBJECTIVE: Henry Neal went to the ER in the evening of 10/11 with profound dyspnea and epigastric discomfort.    He came to the ER and was put on Bipap, IV NTG, and was diuresed.  CXR showed pulmonary edema.  Echo showed EF down to 15% with possible apical thrombus.     Yesterday had RHC/LHC. Denies SOB/CP  RHC/LHC10/13 1. Low to normal left and right heart filling pressures with preserved cardiac output.  2. The appearance of the left coronary system is similar to 12/15, the difference being that the LAD is now only subtotally occluded with some flow throughout its entirety. 3. 80-90% hazy stenosis proximal RCA consistent with thrombus.  DES placed to RCA Scheduled Meds: . antiseptic oral rinse  7 mL Mouth Rinse BID  . aspirin  81 mg Oral Daily  . atorvastatin  40 mg Oral QHS  . atropine      . chlorhexidine  15 mL Mouth Rinse BID  . clopidogrel  75 mg Oral Q breakfast  . finasteride  5 mg Oral Daily  . gabapentin  300 mg Oral BID  . isosorbide mononitrate  30 mg Oral Daily  . pantoprazole  40 mg Oral Daily  . sodium chloride  3 mL Intravenous Q12H   Continuous Infusions: . heparin 1,100 Units/hr (08/05/15 0457)   PRN Meds:.sodium chloride, acetaminophen, alum & mag hydroxide-simeth, ondansetron (ZOFRAN) IV, ondansetron, senna, sodium chloride    Filed Vitals:   08/05/15 0321 08/05/15 0400 08/05/15 0500 08/05/15 0600  BP: 102/45 97/37  103/45  Pulse: 60 68  56  Temp: 97.8 F (36.6 C)     TempSrc: Oral     Resp: 10 11  10   Height:      Weight:   181 lb 7 oz (82.3 kg)   SpO2: 98% 95%  99%    Intake/Output Summary (Last 24 hours) at 08/05/15 0708 Last data filed at 08/05/15 0600  Gross per 24 hour  Intake 669.03 ml  Output   2275 ml  Net -1605.97 ml    LABS: Basic Metabolic Panel:  Recent Labs  08/04/15 0255 08/04/15 2009 08/05/15 0251  NA 131* 134* 134*  K 3.7 3.2* 3.4*    CL 94*  --  95*  CO2 28  --  30  GLUCOSE 104*  --  156*  BUN 32*  --  24*  CREATININE 1.47*  --  1.32*  CALCIUM 8.4*  --  8.6*   Liver Function Tests: No results for input(s): AST, ALT, ALKPHOS, BILITOT, PROT, ALBUMIN in the last 72 hours. No results for input(s): LIPASE, AMYLASE in the last 72 hours. CBC:  Recent Labs  08/04/15 0255 08/04/15 2009 08/05/15 0251  WBC 8.1  --  8.2  HGB 9.9* 10.5* 10.2*  HCT 31.3* 31.0* 31.9*  MCV 87.9  --  89.4  PLT 167  --  165   Cardiac Enzymes:  Recent Labs  08/03/15 0520 08/03/15 1027 08/03/15 1545  CKTOTAL 128 105 97  CKMB 8.2* 7.4* 5.5*  TROPONINI 0.42* 0.47* 0.45*   BNP: Invalid input(s): POCBNP D-Dimer: No results for input(s): DDIMER in the last 72 hours. Hemoglobin A1C:  Recent Labs  08/03/15 0520  HGBA1C 6.2*   Fasting Lipid Panel:  Recent Labs  08/03/15 0520  CHOL 105  HDL 44  LDLCALC 51  TRIG 49  CHOLHDL 2.4   Thyroid  Function Tests:  Recent Labs  08/03/15 0520  TSH 0.867   Anemia Panel: No results for input(s): VITAMINB12, FOLATE, FERRITIN, TIBC, IRON, RETICCTPCT in the last 72 hours.  RADIOLOGY: Dg Chest Port 1 View  08/02/2015  CLINICAL DATA:  81 year old male with shortness of breath and abnormal pulmonary auscultation. Initial encounter. EXAM: PORTABLE CHEST 1 VIEW COMPARISON:  10/09/2013. FINDINGS: Portable AP semi upright view at 2329 hours. Diffuse pulmonary interstitial opacity. Cardiomegaly appears increased since 2014. Other mediastinal contours are within normal limits. Small right pleural effusion suspected. No pneumothorax. Patchy and confluent retrocardiac opacity, favor atelectasis. IMPRESSION: Acute pulmonary edema. Small right effusion. Cardiomegaly appears increased since 2014. Electronically Signed   By: Genevie Ann M.D.   On: 08/02/2015 23:39    PHYSICAL EXAM General: NAD. In bed  Neck: JVP flat, no thyromegaly or thyroid nodule.  Lungs: Mildly decreased breath sounds  bilaterally. On 2 liters [place] CV: Nondisplaced PMI.  Heart regular S1/S2, no S3/S4, no murmur.  No peripheral edema.  No carotid bruit.   Abdomen: Soft, nontender, no hepatosplenomegaly, no distention.  Neurologic: Alert and oriented x 3.  Psych: Normal affect. Extremities: No clubbing or cyanosis. Right groin cath site benign.   TELEMETRY: Sinus Loletha Grayer - Sinus Rhythm 50-60s  ASSESSMENT AND PLAN: 81 yo with history of AAA s/p repair, known CAD, prior R CEA, and chronic LBBB presented with acute onset severe dyspnea and epigstric pain.  He has had some exertional dyspnea long-term. He was found to have acute pulmonary edema on CXR and has been diuresed.  Echo with EF 15%.  1. Acute on ?chronic systolic CHF:  Acute pulmonary edema at admission.  Echo this admission with EF 15% and multiple wall motion abnormalities.  Last prior assessment of EF was by LV-gram in 12/15, estimated 50%.  RHC with low to normal filling pressures and preserved cardiac output.  Suspect flash pulmonary edema in setting of ischemia/ACS with RCA plaque rupture.  -Volume status low. No diuretics. Consider spiro tomorrow.  - No bb for now. HR upper 50s-low 60s. - Add 2.5 mg lisinopril daily. Watch renal function.   2. CAD: Known CAD, LHC in 12/15 with total occlusion mLAD, total occlusion OM1, 50-60% pRCA, 70% PDA.  NSTEMI this admission.  S/P LHC 08/04/15 with DES placed RCA for suspected RCA plaque rupture/thrombus.   - Continue plavix, ASA,  and statin.  - Consult  Cardiac rehab.  - Cardiac MRI to look for viability in the LAD territory => given marked fall in EF, question whether PCI of LAD would be helpful.  The LAD was only subtotally occluded on 08/04/15 cath (as opposed to 12/15 cath) with at least a trickle of flow throughout its entirety.  3. Possible LV mural thrombus: Somewhat questionable by echo but need to rule out. Will get cardiac MRI this morning.  Continue heparin gtt until we rule in/out thrombus.   4. CKD:  Creatinine coming down 1.32. Diuretics on hold .  5. Hypokalemia- Give 20 meq potassium now.   Amy Clegg NP-C  08/05/2015 7:08 AM   Patient seen with NP, doing considerably better today.  No dyspnea.  Has some mild epigastric pain last night, thinks it may have been gas.  Doing well today, no volume overload.  Can add lisinopril 2.5 mg daily as above.    Ambulate with cardiac rehab.   Cardiac MRI: Need to rule in or out LV thrombus, continue heparin gtt until we know.  I reviewed echo, thrombus is  somewhat equivocal. Also look for viability in LAD territory => would consider intervention on LAD if there is viability.   Can go to telemetry.   Loralie Champagne 08/05/2015 8:07 AM

## 2015-08-05 NOTE — Progress Notes (Signed)
Act read 128, sheath prepared for pull out. Atropine at bedside and Thomasene Lot was present during sheath pull. Patient tolerated procedure well, VSS stable thru out procedure, see flowsheet. Pedal pulses present during manual pressure hold. Pressure applied for 25 minutes. Groin remains level zero. Gauze dressing applied and taped with medi pore tape. Instructions given to hold pressure during coughing and sneezing. Instructions understood per patient. Will continue to monitor closely.

## 2015-08-05 NOTE — Progress Notes (Signed)
CARDIAC REHAB PHASE I   PRE:  Rate/Rhythm: 30 SR  BP:  Supine: 100/43  Sitting:   Standing:    SaO2: 96%2L  MODE:  Ambulation: 390 ft   POST:  Rate/Rhythm: 84 SR PVCs  BP:  Supine:   Sitting: 119/49  Standing:    SaO2: 95%2L 1455-1530 Came earlier and pt going for MRI, came back and pt eating lunch. Returned now to walk. Pt walked 390 ft on 2L with rolling walker and minimal asst. Did well. No complaints. To recliner with call bell. Walked on oxygen since first walk up and awaiting MRI results. Pt did Maintenance and knows he will need to do CRP 2. Will refer to [place]. Will follow up tomorrow.   Graylon Good, RN BSN  08/05/2015 3:25 PM

## 2015-08-05 NOTE — Progress Notes (Signed)
ANTICOAGULATION CONSULT NOTE - Follow Up Consult  Pharmacy Consult for Heparin Indication: chest pain/ACS  Allergies  Allergen Reactions  . Sinequan [Doxepin Hcl] Other (See Comments)    hallucinations  . Verapamil Other (See Comments)    Caused heart to race  . Procaine Hcl Other (See Comments)    Tingling all over as soon as it was injected    Patient Measurements: Height: 5\' 10"  (177.8 cm) Weight: 181 lb 7 oz (82.3 kg) IBW/kg (Calculated) : 73 Heparin Dosing Weight: 86.5 kg  Vital Signs: Temp: 98.8 F (37.1 C) (10/14 1147) Temp Source: Oral (10/14 1147) BP: 110/65 mmHg (10/14 1147) Pulse Rate: 73 (10/14 1147)  Labs:  Recent Labs  08/02/15 2336 08/03/15 0520 08/03/15 1027  08/03/15 1545 08/03/15 2301 08/04/15 0255 08/04/15 1210 08/04/15 2009 08/05/15 0251 08/05/15 1523  HGB 11.8*  --   --   --   --   --  9.9*  --  10.5* 10.2*  --   HCT 36.9*  --   --   --   --   --  31.3*  --  31.0* 31.9*  --   PLT 208  --   --   --   --   --  167  --   --  165  --   LABPROT  --   --   --   --   --   --   --  17.0*  --   --   --   INR  --   --   --   --   --   --   --  1.38  --   --   --   HEPARINUNFRC  --   --   --   < >  --  0.43 0.40  --   --   --  0.23*  CREATININE 1.35*  --  1.41*  --   --   --  1.47*  --   --  1.32*  --   CKTOTAL  --  128 105  --  97  --   --   --   --   --   --   CKMB  --  8.2* 7.4*  --  5.5*  --   --   --   --   --   --   TROPONINI  --  0.42* 0.47*  --  0.45*  --   --   --   --   --   --   < > = values in this interval not displayed.  Estimated Creatinine Clearance: 42.2 mL/min (by C-G formula based on Cr of 1.32).  Assessment: 81 y/o M with known CAD here with chest pain/shortness of breath, has mildly elevated troponin. S/P cath with PCI to RCA, possible LV thrombus-  restarted heparin 8hr after sheath pulled.   Heparin level this afternoon was drawn late and was slightly low at 0.23 units/mL. No issues with line per RN, and nothing reported  about infusion being stopped. No bleeding noted.  Goal of Therapy:  Heparin level 0.3-0.7 units/ml Monitor platelets by anticoagulation protocol: Yes   Plan:  -increase heparin infusion to 1200 units/hr -check HL in 8h -daily HL and CBC -follow for s/s bleeding and long term AC plans  Azaria Stegman D. Rhandi Despain, PharmD, BCPS Clinical Pharmacist Pager: [phone] 08/05/2015 4:26 PM

## 2015-08-05 NOTE — Care Management Important Message (Signed)
Important Message  Patient Details  Name: Henry Neal MRN: [id number] Date of Birth: 18-Feb-1927   Medicare Important Message Given:  Yes-second notification given    Delorse Lek 08/05/2015, 10:56 AM

## 2015-08-06 DIAGNOSIS — I214 Non-ST elevation (NSTEMI) myocardial infarction: Principal | ICD-10-CM

## 2015-08-06 LAB — BASIC METABOLIC PANEL
Anion gap: 8 (ref 5–15)
BUN: 26 mg/dL — ABNORMAL HIGH (ref 6–20)
CHLORIDE: 99 mmol/L — AB (ref 101–111)
CO2: 29 mmol/L (ref 22–32)
Calcium: 9 mg/dL (ref 8.9–10.3)
Creatinine, Ser: 1.16 mg/dL (ref 0.61–1.24)
GFR calc non Af Amer: 56 mL/min — ABNORMAL LOW (ref >60)
Glucose, Bld: 96 mg/dL (ref 65–99)
POTASSIUM: 4 mmol/L (ref 3.5–5.1)
SODIUM: 136 mmol/L (ref 135–145)

## 2015-08-06 LAB — CBC
HEMATOCRIT: 30.8 % — AB (ref 39.0–52.0)
HEMOGLOBIN: 9.9 g/dL — AB (ref 13.0–17.0)
MCH: 28.9 pg (ref 26.0–34.0)
MCHC: 32.1 g/dL (ref 30.0–36.0)
MCV: 89.8 fL (ref 78.0–100.0)
Platelets: 164 10*3/uL (ref 150–400)
RBC: 3.43 MIL/uL — AB (ref 4.22–5.81)
RDW: 14.3 % (ref 11.5–15.5)
WBC: 7.9 10*3/uL (ref 4.0–10.5)

## 2015-08-06 MED ORDER — CARVEDILOL 3.125 MG PO TABS
3.1250 mg | ORAL_TABLET | Freq: Two times a day (BID) | ORAL | Status: DC
  Administered 2015-08-06 – 2015-08-09 (×7): 3.125 mg via ORAL
  Filled 2015-08-06 (×7): qty 1

## 2015-08-06 NOTE — Progress Notes (Signed)
CARDIAC REHAB PHASE I   PRE:  Rate/Rhythm: 70 SR BBB  BP:  Supine:   Sitting: 112/68  Standing:    SaO2: 95 RA  MODE:  Ambulation: 420 ft   POST:  Rate/Rhythm: 88  BP:  Supine:   Sitting: 132/62  Standing:    SaO2: 95 RA 1145-1220 Assisted X 1 and used walker to ambulate. Gait steady with walker. Pt only c/o with walking was discomfort with his hydrocele. No c/o of cp or SOB walking. VS stable Pt to bathroom after walk.  Rodney Langton RN 08/06/2015 12:19 PM

## 2015-08-06 NOTE — Progress Notes (Signed)
Patient ID: AZAM GERVASI, male   DOB: [birth year], 81 y.o.   MRN: [id number]     SUBJECTIVE: Mr Amores went to the ER in the evening of 10/11 with profound dyspnea and epigastric discomfort.    He came to the ER and was put on Bipap, IV NTG, and was diuresed.  CXR showed pulmonary edema.  Echo showed EF down to 15% with possible apical thrombus.    RHC/LHC 10/13 1. Low to normal left and right heart filling pressures with preserved cardiac output.  2. The appearance of the left coronary system is similar to 12/15, the difference being that the LAD is now only subtotally occluded with some flow throughout its entirety. 3. 80-90% hazy stenosis proximal RCA consistent with thrombus.  DES placed to RCA  Cardiac MRI (10/14): EF 22%, normal RV, no LV thrombus, possible significant viability in LAD territory.   Today he is doing well.  No chest pain, no dyspnea.  Really has not been out of bed much at all.   Scheduled Meds: . antiseptic oral rinse  7 mL Mouth Rinse BID  . aspirin  81 mg Oral Daily  . atorvastatin  40 mg Oral QHS  . carvedilol  3.125 mg Oral BID WC  . chlorhexidine  15 mL Mouth Rinse BID  . clopidogrel  75 mg Oral Q breakfast  . finasteride  5 mg Oral Daily  . gabapentin  300 mg Oral BID  . heparin subcutaneous  5,000 Units Subcutaneous 3 times per day  . isosorbide mononitrate  30 mg Oral Daily  . lisinopril  2.5 mg Oral Daily  . pantoprazole  40 mg Oral Daily  . sodium chloride  3 mL Intravenous Q12H   Continuous Infusions:   PRN Meds:.sodium chloride, acetaminophen, alum & mag hydroxide-simeth, ondansetron (ZOFRAN) IV, ondansetron, senna, sodium chloride    Filed Vitals:   08/05/15 1147 08/05/15 1627 08/05/15 2005 08/06/15 0525  BP: 110/65 95/51 113/49 118/56  Pulse: 73 67 64 69  Temp: 98.8 F (37.1 C) 98 F (36.7 C) 97.8 F (36.6 C) 98 F (36.7 C)  TempSrc: Oral Oral Oral Oral  Resp: 17 18 18 18   Height:   5\' 10"  (1.778 m)   Weight:    180 lb 4.8 oz  (81.784 kg)  SpO2: 93% 96% 98% 98%    Intake/Output Summary (Last 24 hours) at 08/06/15 1002 Last data filed at 08/06/15 0900  Gross per 24 hour  Intake 297.01 ml  Output   1400 ml  Net -1102.99 ml    LABS: Basic Metabolic Panel:  Recent Labs  08/05/15 0251 08/06/15 0307  NA 134* 136  K 3.4* 4.0  CL 95* 99*  CO2 30 29  GLUCOSE 156* 96  BUN 24* 26*  CREATININE 1.32* 1.16  CALCIUM 8.6* 9.0   Liver Function Tests: No results for input(s): AST, ALT, ALKPHOS, BILITOT, PROT, ALBUMIN in the last 72 hours. No results for input(s): LIPASE, AMYLASE in the last 72 hours. CBC:  Recent Labs  08/05/15 0251 08/06/15 0307  WBC 8.2 7.9  HGB 10.2* 9.9*  HCT 31.9* 30.8*  MCV 89.4 89.8  PLT 165 164   Cardiac Enzymes:  Recent Labs  08/03/15 1027 08/03/15 1545  CKTOTAL 105 97  CKMB 7.4* 5.5*  TROPONINI 0.47* 0.45*   BNP: Invalid input(s): POCBNP D-Dimer: No results for input(s): DDIMER in the last 72 hours. Hemoglobin A1C: No results for input(s): HGBA1C in the last 72 hours. Fasting Lipid Panel: No  results for input(s): CHOL, HDL, LDLCALC, TRIG, CHOLHDL, LDLDIRECT in the last 72 hours. Thyroid Function Tests: No results for input(s): TSH, T4TOTAL, T3FREE, THYROIDAB in the last 72 hours.  Invalid input(s): FREET3 Anemia Panel: No results for input(s): VITAMINB12, FOLATE, FERRITIN, TIBC, IRON, RETICCTPCT in the last 72 hours.  RADIOLOGY: Dg Chest Port 1 View  08/02/2015  CLINICAL DATA:  81 year old male with shortness of breath and abnormal pulmonary auscultation. Initial encounter. EXAM: PORTABLE CHEST 1 VIEW COMPARISON:  10/09/2013. FINDINGS: Portable AP semi upright view at 2329 hours. Diffuse pulmonary interstitial opacity. Cardiomegaly appears increased since 2014. Other mediastinal contours are within normal limits. Small right pleural effusion suspected. No pneumothorax. Patchy and confluent retrocardiac opacity, favor atelectasis. IMPRESSION: Acute pulmonary  edema. Small right effusion. Cardiomegaly appears increased since 2014. Electronically Signed   By: Genevie Ann M.D.   On: 08/02/2015 23:39   Mr Card Morphology Wo/w Cm  08/05/2015  CLINICAL DATA:  Ischemic cardiomyopathy, assess for viability and LV thrombus. EXAM: CARDIAC MRI TECHNIQUE: The patient was scanned on a 1.5 Tesla GE magnet. A dedicated cardiac coil was used. Functional imaging was done using Fiesta sequences. 2,3, and 4 chamber views were done to assess for RWMA's. Modified Simpson's rule using a short axis stack was used to calculate an ejection fraction on a dedicated work Conservation officer, nature. The patient received 30 cc of Multihance. After 10 minutes inversion recovery sequences were used to assess for infiltration and scar tissue. CONTRAST:  30 cc Multihance FINDINGS: There were small bilateral pleural effusions. Severely dilated LV with severe global hypokinesis, EF 22%. There was septal-lateral dyssynchrony. No LV thrombus noted. Normal right ventricular size and systolic function. Mild left atrial enlargement, normal right atrial size. Trileaflet aortic valve with no stenosis or regurgitation. Probably mild mitral regurgitation. On delayed enhancement imaging, the late gadolinium enhancement pattern (LGE) was as follows: - Small area < 50% thickness subendocardial LGE in the basal inferolateral wall. - Small area 76-99% thickness subendocardial LGE in the apical anterior wall. - About 50% thickness subendocardial LGE in the mid to apical anteroseptal wall. MEASUREMENTS: MEASUREMENTS LV EDV:  375 mL LV SV:  81 mL LV EF: 22% IMPRESSION: 1. Severely dilated LV with severely decreased systolic function, EF 19% with septal-lateral dyssynchrony. 2.  No LV thrombus. 3.  Normal RV size and systolic function. 4. LGE pattern suggesting that there may be significant viable tissue in the LAD territory. Denetra Formoso Electronically Signed   By: Loralie Champagne M.D.   On: 08/05/2015 17:40     PHYSICAL EXAM General: NAD. In bed  Neck: JVP flat, no thyromegaly or thyroid nodule.  Lungs: Mildly decreased breath sounds bilaterally. On 2 liters [place] CV: Nondisplaced PMI.  Heart regular S1/S2, no S3/S4, no murmur.  No peripheral edema.  No carotid bruit.   Abdomen: Soft, nontender, no hepatosplenomegaly, no distention.  Neurologic: Alert and oriented x 3.  Psych: Normal affect. Extremities: No clubbing or cyanosis. Right groin cath site benign.   TELEMETRY: Sinus Loletha Grayer - Sinus Rhythm 50-60s  ASSESSMENT AND PLAN: 81 yo with history of AAA s/p repair, known CAD, prior R CEA, and chronic LBBB presented with acute onset severe dyspnea and epigstric pain.  He has had some exertional dyspnea long-term. He was found to have acute pulmonary edema on CXR and has been diuresed.  Echo with EF 15%.  1. Acute on ?chronic systolic CHF:  Acute pulmonary edema at admission.  Echo this admission with EF 15% and  multiple wall motion abnormalities.  Last prior assessment of EF was by LV-gram in 12/15, estimated 50%.  RHC with low to normal filling pressures and preserved cardiac output.  Suspect flash pulmonary edema in setting of ischemia/ACS with RCA plaque rupture.  CMRI 10/14 with EF 22%, no LV thrombus, RV ok, possible significant viability in LAD territory.  Volume status ok now.  - Add low dose spironolactone 12.5 mg daily and Coreg 3.125 mg bid.  - Continue current lisinopril.  2. CAD: Known CAD, LHC in 12/15 with total occlusion mLAD, total occlusion OM1, 50-60% pRCA, 70% PDA.  NSTEMI this admission.  S/P LHC 08/04/15 with DES placed RCA for suspected RCA plaque rupture/thrombus.   - Continue plavix, ASA,  and statin.  - Consult cardiac rehab.  - Cardiac MRI to look for viability in the LAD territory => given marked fall in EF, question whether PCI of LAD would be helpful.  The LAD was only subtotally occluded on 08/04/15 cath (as opposed to 12/15 cath) with at least a trickle of flow throughout  its entirety.  MRI showed possible significant viability in the LAD territory.  I will review films with one of my interventional colleagues and decide on LAD intervention => could we do this Monday?  3. Possible LV mural thrombus: Somewhat questionable by echo but not seen on MRI.  Stopped heparin gtt.   4. CKD: Creatinine improved.   Loralie Champagne 08/06/2015 10:02 AM

## 2015-08-07 LAB — BASIC METABOLIC PANEL WITH GFR
Anion gap: 11 (ref 5–15)
BUN: 21 mg/dL — ABNORMAL HIGH (ref 6–20)
CO2: 26 mmol/L (ref 22–32)
Calcium: 9.3 mg/dL (ref 8.9–10.3)
Chloride: 96 mmol/L — ABNORMAL LOW (ref 101–111)
Creatinine, Ser: 0.96 mg/dL (ref 0.61–1.24)
GFR calc Af Amer: >60 mL/min
GFR calc non Af Amer: >60 mL/min
Glucose, Bld: 99 mg/dL (ref 65–99)
Potassium: 3.9 mmol/L (ref 3.5–5.1)
Sodium: 133 mmol/L — ABNORMAL LOW (ref 135–145)

## 2015-08-07 LAB — CBC
HEMATOCRIT: 32.1 % — AB (ref 39.0–52.0)
HEMOGLOBIN: 10.2 g/dL — AB (ref 13.0–17.0)
MCH: 28.3 pg (ref 26.0–34.0)
MCHC: 31.8 g/dL (ref 30.0–36.0)
MCV: 88.9 fL (ref 78.0–100.0)
Platelets: 165 10*3/uL (ref 150–400)
RBC: 3.61 MIL/uL — ABNORMAL LOW (ref 4.22–5.81)
RDW: 14.1 % (ref 11.5–15.5)
WBC: 7.6 10*3/uL (ref 4.0–10.5)

## 2015-08-07 MED ORDER — SPIRONOLACTONE 12.5 MG HALF TABLET
12.5000 mg | ORAL_TABLET | Freq: Every day | ORAL | Status: DC
  Administered 2015-08-07 – 2015-08-08 (×2): 12.5 mg via ORAL
  Filled 2015-08-07 (×4): qty 1

## 2015-08-07 NOTE — Evaluation (Signed)
Physical Therapy Evaluation Patient Details Name: Henry Neal MRN: [id number] DOB: [birth year] Today's Date: 08/07/2015   History of Present Illness  Henry Neal went to the ER in the evening of 10/11 with profound dyspnea and epigastric discomfort. He came to the ER and was put on Bipap, IV NTG, and was diuresed. CXR showed pulmonary edema. Echo showed EF down to 15% with possible apical thrombus. Plan for cardiac cath 10/17  Clinical Impression   Pt admitted with above diagnosis. Pt currently with functional limitations due to the deficits listed below (see PT Problem List).  Pt will benefit from skilled PT to increase their independence and safety with mobility to allow discharge to the venue listed below.       Follow Up Recommendations Home health PT (May not need HHPT; will discern that post cardiac cath)    Equipment Recommendations  Other (comment) (cane vs RW)    Recommendations for Other Services       Precautions / Restrictions Precautions Precautions: Fall Precaution Comments: fall risk is present, but slight      Mobility  Bed Mobility                  Transfers Overall transfer level: Needs assistance Equipment used: None Transfers: Sit to/from Stand Sit to Stand: Supervision         General transfer comment: Cues to self-monitor for activity tolerance; no gross difficulty with rising  Ambulation/Gait Ambulation/Gait assistance: Min guard;Supervision Ambulation Distance (Feet): 200 Feet Assistive device: None (and pushing dinamap) Gait Pattern/deviations: Step-through pattern     General Gait Details: somewhat stiff gait; no gross loss of balance noted  Stairs            Wheelchair Mobility    Modified Rankin (Stroke Patients Only)       Balance                                             Pertinent Vitals/Pain Pain Assessment: Faces Faces Pain Scale: Hurts a little bit Pain Location: lateral side of  L lower leg; pt also reports a rash on his back; he states RN and MD aware of rash on his back    Home Living Family/patient expects to be discharged to:: Private residence Living Arrangements: Alone Available Help at Discharge: Family;Available PRN/intermittently Type of Home: House Home Access: Stairs to enter Entrance Stairs-Rails:  (to be determined) Entrance Stairs-Number of Steps: 3 Home Layout: One level Home Equipment: None (can borrow son's RW if necessary)      Prior Function Level of Independence: Independent         Comments: driving, did not use a cane PTA     Hand Dominance        Extremity/Trunk Assessment   Upper Extremity Assessment: Overall WFL for tasks assessed           Lower Extremity Assessment: Overall WFL for tasks assessed (except for pain L lateral lower LE with amb)         Communication   Communication: No difficulties  Cognition Arousal/Alertness: Awake/alert Behavior During Therapy: WFL for tasks assessed/performed Overall Cognitive Status: Within Functional Limits for tasks assessed                      General Comments General comments (skin integrity, edema, etc.): Walked without assistive device --  he did not need one PTA; Still, i believe his gait would be smoother with RW/UE support; will try can next visit    Exercises        Assessment/Plan    PT Assessment Patient needs continued PT services  PT Diagnosis Difficulty walking;Generalized weakness   PT Problem List Decreased strength;Decreased activity tolerance;Decreased mobility;Decreased knowledge of use of DME;Cardiopulmonary status limiting activity  PT Treatment Interventions DME instruction;Gait training;Stair training;Functional mobility training;Therapeutic activities;Therapeutic exercise;Patient/family education   PT Goals (Current goals can be found in the Care Plan section) Acute Rehab PT Goals Patient Stated Goal: hopes the cath goes well  tomorrow PT Goal Formulation: With patient Time For Goal Achievement: 08/21/15 Potential to Achieve Goals: Good    Frequency Min 3X/week   Barriers to discharge        Co-evaluation               End of Session   Activity Tolerance: Patient tolerated treatment well Patient left: in chair;with call bell/phone within reach Nurse Communication: Mobility status         Time: 0131-4388 PT Time Calculation (min) (ACUTE ONLY): 22 min   Charges:   PT Evaluation $Initial PT Evaluation Tier I: 1 Procedure     PT G CodesRoney Marion Neal 08/07/2015, 6:03 PM  Henry Marion, PT  Acute Rehabilitation Services Pager [phone] Office [phone]

## 2015-08-07 NOTE — Progress Notes (Signed)
Patient ID: Henry Neal, male   DOB: 1927-03-27, 81 y.o.   MRN: [id number]     SUBJECTIVE: Henry Neal went to the ER in the evening of 10/11 with profound dyspnea and epigastric discomfort.    He came to the ER and was put on Bipap, IV NTG, and was diuresed.  CXR showed pulmonary edema.  Echo showed EF down to 15% with possible apical thrombus.    RHC/LHC 10/13 1. Low to normal left and right heart filling pressures with preserved cardiac output.  2. The appearance of the left coronary system is similar to 12/15, the difference being that the LAD is now only subtotally occluded with some flow throughout its entirety. 3. 80-90% hazy stenosis proximal RCA consistent with thrombus.  DES placed to RCA  Cardiac MRI (10/14): EF 22%, normal RV, no LV thrombus, possible significant viability in LAD territory.   Today he is doing well.  No chest pain, no dyspnea.  Did well walking with cardiac rehab yesterday.   Scheduled Meds: . antiseptic oral rinse  7 mL Mouth Rinse BID  . aspirin  81 mg Oral Daily  . atorvastatin  40 mg Oral QHS  . carvedilol  3.125 mg Oral BID WC  . chlorhexidine  15 mL Mouth Rinse BID  . clopidogrel  75 mg Oral Q breakfast  . finasteride  5 mg Oral Daily  . gabapentin  300 mg Oral BID  . heparin subcutaneous  5,000 Units Subcutaneous 3 times per day  . isosorbide mononitrate  30 mg Oral Daily  . lisinopril  2.5 mg Oral Daily  . pantoprazole  40 mg Oral Daily  . sodium chloride  3 mL Intravenous Q12H  . spironolactone  12.5 mg Oral Daily   Continuous Infusions:   PRN Meds:.sodium chloride, acetaminophen, alum & mag hydroxide-simeth, ondansetron (ZOFRAN) IV, ondansetron, senna, sodium chloride    Filed Vitals:   08/06/15 0525 08/06/15 1151 08/06/15 2040 08/07/15 0500  BP: 118/56 112/68 116/60 116/55  Pulse: 69 70 69 57  Temp: 98 F (36.7 C) 98 F (36.7 C) 98.5 F (36.9 C) 98.4 F (36.9 C)  TempSrc: Oral Oral Oral Oral  Resp: 18  18 18   Height:        Weight: 180 lb 4.8 oz (81.784 kg)   183 lb 11.2 oz (83.326 kg)  SpO2: 98% 95% 99% 98%    Intake/Output Summary (Last 24 hours) at 08/07/15 0930 Last data filed at 08/07/15 0926  Gross per 24 hour  Intake    840 ml  Output   2176 ml  Net  -1336 ml    LABS: Basic Metabolic Panel:  Recent Labs  08/06/15 0307 08/07/15 0320  NA 136 133*  K 4.0 3.9  CL 99* 96*  CO2 29 26  GLUCOSE 96 99  BUN 26* 21*  CREATININE 1.16 0.96  CALCIUM 9.0 9.3   Liver Function Tests: No results for input(s): AST, ALT, ALKPHOS, BILITOT, PROT, ALBUMIN in the last 72 hours. No results for input(s): LIPASE, AMYLASE in the last 72 hours. CBC:  Recent Labs  08/06/15 0307 08/07/15 0320  WBC 7.9 7.6  HGB 9.9* 10.2*  HCT 30.8* 32.1*  MCV 89.8 88.9  PLT 164 165   Cardiac Enzymes: No results for input(s): CKTOTAL, CKMB, CKMBINDEX, TROPONINI in the last 72 hours. BNP: Invalid input(s): POCBNP D-Dimer: No results for input(s): DDIMER in the last 72 hours. Hemoglobin A1C: No results for input(s): HGBA1C in the last 72 hours. Fasting  Lipid Panel: No results for input(s): CHOL, HDL, LDLCALC, TRIG, CHOLHDL, LDLDIRECT in the last 72 hours. Thyroid Function Tests: No results for input(s): TSH, T4TOTAL, T3FREE, THYROIDAB in the last 72 hours.  Invalid input(s): FREET3 Anemia Panel: No results for input(s): VITAMINB12, FOLATE, FERRITIN, TIBC, IRON, RETICCTPCT in the last 72 hours.  RADIOLOGY: Dg Chest Port 1 View  08/02/2015  CLINICAL DATA:  81 year old male with shortness of breath and abnormal pulmonary auscultation. Initial encounter. EXAM: PORTABLE CHEST 1 VIEW COMPARISON:  10/09/2013. FINDINGS: Portable AP semi upright view at 2329 hours. Diffuse pulmonary interstitial opacity. Cardiomegaly appears increased since 2014. Other mediastinal contours are within normal limits. Small right pleural effusion suspected. No pneumothorax. Patchy and confluent retrocardiac opacity, favor atelectasis.  IMPRESSION: Acute pulmonary edema. Small right effusion. Cardiomegaly appears increased since 2014. Electronically Signed   By: Genevie Ann M.D.   On: 08/02/2015 23:39   Henry Neal Wo/w Cm  08/05/2015  CLINICAL DATA:  Ischemic cardiomyopathy, assess for viability and LV thrombus. EXAM: CARDIAC MRI TECHNIQUE: The patient was scanned on a 1.5 Tesla GE magnet. A dedicated cardiac coil was used. Functional imaging was done using Fiesta sequences. 2,3, and 4 chamber views were done to assess for RWMA's. Modified Simpson's rule using a short axis stack was used to calculate an ejection fraction on a dedicated work Conservation officer, nature. The patient received 30 cc of Multihance. After 10 minutes inversion recovery sequences were used to assess for infiltration and scar tissue. CONTRAST:  30 cc Multihance FINDINGS: There were small bilateral pleural effusions. Severely dilated LV with severe global hypokinesis, EF 22%. There was septal-lateral dyssynchrony. No LV thrombus noted. Normal right ventricular size and systolic function. Mild left atrial enlargement, normal right atrial size. Trileaflet aortic valve with no stenosis or regurgitation. Probably mild mitral regurgitation. On delayed enhancement imaging, the late gadolinium enhancement pattern (LGE) was as follows: - Small area < 50% thickness subendocardial LGE in the basal inferolateral wall. - Small area 76-99% thickness subendocardial LGE in the apical anterior wall. - About 50% thickness subendocardial LGE in the mid to apical anteroseptal wall. MEASUREMENTS: MEASUREMENTS LV EDV:  375 mL LV SV:  81 mL LV EF: 22% IMPRESSION: 1. Severely dilated LV with severely decreased systolic function, EF 62% with septal-lateral dyssynchrony. 2.  No LV thrombus. 3.  Normal RV size and systolic function. 4. LGE pattern suggesting that there may be significant viable tissue in the LAD territory. Henry Neal Electronically Signed   By: Loralie Champagne M.D.   On:  08/05/2015 17:40    PHYSICAL EXAM General: NAD. In bed  Neck: JVP flat, no thyromegaly or thyroid nodule.  Lungs: Mildly decreased breath sounds bilaterally. On 2 liters  CV: Nondisplaced PMI.  Heart regular S1/S2, no S3/S4, no murmur.  No peripheral edema.  No carotid bruit.   Abdomen: Soft, nontender, no hepatosplenomegaly, no distention.  Neurologic: Alert and oriented x 3.  Psych: Normal affect. Extremities: No clubbing or cyanosis. Right groin cath site benign.  Skin: Mild erythematous rash on back.   TELEMETRY: Sinus Loletha Grayer - Sinus Rhythm 50-60s  ASSESSMENT AND PLAN: 81 yo with history of AAA s/p repair, known CAD, prior R CEA, and chronic LBBB presented with acute onset severe dyspnea and epigstric pain.  He has had some exertional dyspnea long-term. He was found to have acute pulmonary edema on CXR and has been diuresed.  Echo with EF 15%.  1. Acute on ?chronic systolic CHF:  Acute pulmonary edema  at admission.  Echo this admission with EF 15% and multiple wall motion abnormalities.  Last prior assessment of EF was by LV-gram in 12/15, estimated 50%.  RHC with low to normal filling pressures and preserved cardiac output.  Suspect flash pulmonary edema in setting of ischemia/ACS with RCA plaque rupture.  CMRI 10/14 with EF 22%, no LV thrombus, RV ok, possible significant viability in LAD territory.  Volume status ok now.  - Add low dose spironolactone 12.5 mg daily today.  - Continue current lisinopril and Coreg.  2. CAD: Known CAD, LHC in 12/15 with total occlusion mLAD, total occlusion OM1, 50-60% pRCA, 70% PDA.  NSTEMI this admission.  S/P LHC 08/04/15 with DES placed RCA for suspected RCA plaque rupture/thrombus.   - Continue plavix, ASA,  and statin.  - Consult cardiac rehab.  - Cardiac MRI to look for viability in the LAD territory => given marked fall in EF, question whether PCI of LAD would be helpful.  The LAD was only subtotally occluded on 08/04/15 cath (as opposed to 12/15  cath) with at least a trickle of flow throughout its entirety.  MRI showed possible significant viability in the LAD territory.  I reviewed films this weekend with Dr Irish Lack, will post case for Dr Martin[place] to intervene on LAD tomorrow morning. 3. Possible LV mural thrombus: Somewhat questionable by echo but not seen on MRI.  Stopped heparin gtt.   4. CKD: Creatinine improved.  5. Rash: Mild rash on back, new (seen by nurse).  Not particularly bothersome to him.  Will monitor, may be related to Plavix. For now, would not change meds.  If we need to, can switch to ticagrelor.   Loralie Champagne 08/07/2015 9:30 AM

## 2015-08-08 ENCOUNTER — Encounter (HOSPITAL_COMMUNITY)
Admission: EM | Disposition: A | Discharge status: Home Health | Payer: Self-pay | Source: Home / Self Care | Attending: Cardiology

## 2015-08-08 ENCOUNTER — Encounter (HOSPITAL_COMMUNITY): Payer: Self-pay | Admitting: *Deleted

## 2015-08-08 DIAGNOSIS — I509 Heart failure, unspecified: Secondary | ICD-10-CM | POA: Insufficient documentation

## 2015-08-08 DIAGNOSIS — I2582 Chronic total occlusion of coronary artery: Secondary | ICD-10-CM

## 2015-08-08 HISTORY — PX: CARDIAC CATHETERIZATION: SHX172

## 2015-08-08 LAB — CBC
HEMATOCRIT: 31.5 % — AB (ref 39.0–52.0)
Hemoglobin: 10.2 g/dL — ABNORMAL LOW (ref 13.0–17.0)
MCH: 28.6 pg (ref 26.0–34.0)
MCHC: 32.4 g/dL (ref 30.0–36.0)
MCV: 88.2 fL (ref 78.0–100.0)
Platelets: 178 10*3/uL (ref 150–400)
RBC: 3.57 MIL/uL — AB (ref 4.22–5.81)
RDW: 14.1 % (ref 11.5–15.5)
WBC: 7.1 10*3/uL (ref 4.0–10.5)

## 2015-08-08 LAB — BASIC METABOLIC PANEL
Anion gap: 8 (ref 5–15)
BUN: 20 mg/dL (ref 6–20)
CALCIUM: 9.3 mg/dL (ref 8.9–10.3)
CO2: 27 mmol/L (ref 22–32)
CREATININE: 1.03 mg/dL (ref 0.61–1.24)
Chloride: 100 mmol/L — ABNORMAL LOW (ref 101–111)
GFR calc Af Amer: >60 mL/min (ref >60)
GFR calc non Af Amer: >60 mL/min (ref >60)
Glucose, Bld: 109 mg/dL — ABNORMAL HIGH (ref 65–99)
Potassium: 3.8 mmol/L (ref 3.5–5.1)
Sodium: 135 mmol/L (ref 135–145)

## 2015-08-08 LAB — POCT ACTIVATED CLOTTING TIME
Activated Clotting Time: 644 seconds
Activated Clotting Time: 147 seconds

## 2015-08-08 SURGERY — CORONARY STENT INTERVENTION
Anesthesia: LOCAL

## 2015-08-08 MED ORDER — VERAPAMIL HCL 2.5 MG/ML IV SOLN
INTRAVENOUS | Status: DC | PRN
  Administered 2015-08-08: 11:00:00 via INTRA_ARTERIAL

## 2015-08-08 MED ORDER — ASPIRIN 81 MG PO CHEW
CHEWABLE_TABLET | ORAL | Status: AC
  Filled 2015-08-08: qty 1

## 2015-08-08 MED ORDER — HEPARIN SODIUM (PORCINE) 1000 UNIT/ML IJ SOLN
INTRAMUSCULAR | Status: DC | PRN
  Administered 2015-08-08: 7000 [IU] via INTRAVENOUS

## 2015-08-08 MED ORDER — SODIUM CHLORIDE 0.9 % WEIGHT BASED INFUSION
1.0000 mL/kg/h | INTRAVENOUS | Status: AC
  Administered 2015-08-08: 12:00:00 1 mL/kg/h via INTRAVENOUS

## 2015-08-08 MED ORDER — ASPIRIN 81 MG PO CHEW
CHEWABLE_TABLET | ORAL | Status: DC | PRN
  Administered 2015-08-08: 81 mg via ORAL

## 2015-08-08 MED ORDER — SODIUM CHLORIDE 0.9 % WEIGHT BASED INFUSION
3.0000 mL/kg/h | INTRAVENOUS | Status: DC
  Administered 2015-08-08: 3 mL/kg/h via INTRAVENOUS

## 2015-08-08 MED ORDER — SODIUM CHLORIDE 0.9 % IV SOLN: 250.0000 mL | INTRAVENOUS | Status: DC | PRN

## 2015-08-08 MED ORDER — SODIUM CHLORIDE 0.9 % IJ SOLN: 3.0000 mL | Freq: Two times a day (BID) | INTRAMUSCULAR | Status: DC

## 2015-08-08 MED ORDER — HEPARIN (PORCINE) IN NACL 2-0.9 UNIT/ML-% IJ SOLN
INTRAMUSCULAR | Status: AC
  Filled 2015-08-08: qty 500

## 2015-08-08 MED ORDER — MIDAZOLAM HCL 2 MG/2ML IJ SOLN
INTRAMUSCULAR | Status: AC
  Filled 2015-08-08: qty 4

## 2015-08-08 MED ORDER — ASPIRIN 81 MG PO CHEW
81.0000 mg | CHEWABLE_TABLET | ORAL | Status: AC
  Administered 2015-08-08: 81 mg via ORAL
  Filled 2015-08-08: qty 1

## 2015-08-08 MED ORDER — SODIUM CHLORIDE 0.9 % WEIGHT BASED INFUSION
1.0000 mL/kg/h | INTRAVENOUS | Status: DC
  Administered 2015-08-08: 1 mL/kg/h via INTRAVENOUS

## 2015-08-08 MED ORDER — SODIUM CHLORIDE 0.9 % IJ SOLN: 3.0000 mL | INTRAMUSCULAR | Status: DC | PRN

## 2015-08-08 MED ORDER — LIDOCAINE HCL (PF) 1 % IJ SOLN
INTRAMUSCULAR | Status: AC
  Filled 2015-08-08: qty 30

## 2015-08-08 MED ORDER — HEPARIN SODIUM (PORCINE) 1000 UNIT/ML IJ SOLN
INTRAMUSCULAR | Status: AC
  Filled 2015-08-08: qty 1

## 2015-08-08 MED ORDER — FENTANYL CITRATE (PF) 100 MCG/2ML IJ SOLN
INTRAMUSCULAR | Status: AC
  Filled 2015-08-08: qty 4

## 2015-08-08 MED ORDER — NITROGLYCERIN 1 MG/10 ML FOR IR/CATH LAB
INTRA_ARTERIAL | Status: AC
  Filled 2015-08-08: qty 10

## 2015-08-08 MED ORDER — MIDAZOLAM HCL 2 MG/2ML IJ SOLN
INTRAMUSCULAR | Status: DC | PRN
  Administered 2015-08-08: 1 mg via INTRAVENOUS

## 2015-08-08 MED ORDER — VERAPAMIL HCL 2.5 MG/ML IV SOLN
INTRAVENOUS | Status: AC
  Filled 2015-08-08: qty 2

## 2015-08-08 MED ORDER — FENTANYL CITRATE (PF) 100 MCG/2ML IJ SOLN
INTRAMUSCULAR | Status: DC | PRN
  Administered 2015-08-08: 25 ug via INTRAVENOUS

## 2015-08-08 MED ORDER — NITROGLYCERIN 1 MG/10 ML FOR IR/CATH LAB
INTRA_ARTERIAL | Status: DC | PRN
  Administered 2015-08-08: 11:00:00

## 2015-08-08 SURGICAL SUPPLY — 19 items
BALLN MINITREK OTW 2.0X12 (BALLOONS) ×2
BALLN ~~LOC~~ EMERGE MR 2.5X15 (BALLOONS) ×2
BALLOON MINITREK OTW 2.0X12 (BALLOONS) IMPLANT
BALLOON ~~LOC~~ EMERGE MR 2.5X15 (BALLOONS) IMPLANT
DEVICE RAD COMP TR BAND LRG (VASCULAR PRODUCTS) ×1 IMPLANT
GLIDESHEATH SLEND SS 6F .021 (SHEATH) ×1 IMPLANT
GUIDE CATH RUNWAY 6FR CLS3 (CATHETERS) ×1 IMPLANT
KIT ENCORE 26 ADVANTAGE (KITS) ×2 IMPLANT
KIT HEART LEFT (KITS) ×2 IMPLANT
PACK CARDIAC CATHETERIZATION (CUSTOM PROCEDURE TRAY) ×2 IMPLANT
SHEATH PINNACLE 6F 10CM (SHEATH) ×1 IMPLANT
STENT PROMUS PREM MR 2.25X20 (Permanent Stent) ×1 IMPLANT
TRANSDUCER W/STOPCOCK (MISCELLANEOUS) ×2 IMPLANT
TUBING CIL FLEX 10 FLL-RA (TUBING) ×2 IMPLANT
VALVE GUARDIAN II ~~LOC~~ HEMO (MISCELLANEOUS) ×1 IMPLANT
WIRE ASAHI FIELDER XT 300CM (WIRE) ×1 IMPLANT
WIRE EMERALD 3MM-J .035X150CM (WIRE) ×1 IMPLANT
WIRE HI TORQ VERSACORE-J 145CM (WIRE) ×1 IMPLANT
WIRE SAFE-T 1.5MM-J .035X260CM (WIRE) ×1 IMPLANT

## 2015-08-08 NOTE — Progress Notes (Signed)
Removed band at 1630 per protocol.  While cleansing site, arterial bleed developed.  Band reapplied with 10 cc air.  Restarted protocol.

## 2015-08-08 NOTE — Care Management Important Message (Signed)
Important Message  Patient Details  Name: Henry Neal MRN: [id number] Date of Birth: 1927/05/03   Medicare Important Message Given:  Yes-third notification given    Delorse Lek 08/08/2015, 1:23 PM

## 2015-08-08 NOTE — Progress Notes (Addendum)
Patient ID: Henry Neal, male   DOB: October 04, 1927, 81 y.o.   MRN: [id number]     SUBJECTIVE: Henry Neal went to the ER in the evening of 10/11 with profound dyspnea and epigastric discomfort.    He came to the ER and was put on Bipap, IV NTG, and was diuresed.  CXR showed pulmonary edema.  Echo showed EF down to 15% with possible apical thrombus.    RHC/LHC 10/13 1. Low to normal left and right heart filling pressures with preserved cardiac output.  2. The appearance of the left coronary system is similar to 12/15, the difference being that the LAD is now only subtotally occluded with some flow throughout its entirety. 3. 80-90% hazy stenosis proximal RCA consistent with thrombus.  DES placed to RCA  Cardiac MRI (10/14): EF 22%, normal RV, no LV thrombus, possible significant viability in LAD territory.   Today he is doing well.  No chest pain, no dyspnea.  Walked with PT yesterday.  Legs are weak, some pain in calves with walking.   Scheduled Meds: . antiseptic oral rinse  7 mL Mouth Rinse BID  . aspirin  81 mg Oral Daily  . atorvastatin  40 mg Oral QHS  . carvedilol  3.125 mg Oral BID WC  . chlorhexidine  15 mL Mouth Rinse BID  . clopidogrel  75 mg Oral Q breakfast  . finasteride  5 mg Oral Daily  . gabapentin  300 mg Oral BID  . heparin subcutaneous  5,000 Units Subcutaneous 3 times per day  . isosorbide mononitrate  30 mg Oral Daily  . lisinopril  2.5 mg Oral Daily  . pantoprazole  40 mg Oral Daily  . sodium chloride  3 mL Intravenous Q12H  . sodium chloride  3 mL Intravenous Q12H  . spironolactone  12.5 mg Oral Daily   Continuous Infusions: . sodium chloride 1 mL/kg/hr (08/08/15 0724)   PRN Meds:.sodium chloride, sodium chloride, acetaminophen, alum & mag hydroxide-simeth, ondansetron (ZOFRAN) IV, ondansetron, senna, sodium chloride, sodium chloride    Filed Vitals:   08/07/15 0500 08/07/15 1139 08/07/15 2049 08/08/15 0534  BP: 116/55 145/60 104/51 112/39  Pulse: 57 71  64 70  Temp: 98.4 F (36.9 C) 98.1 F (36.7 C) 98.3 F (36.8 C) 98 F (36.7 C)  TempSrc: Oral Oral Oral Oral  Resp: 18 18 17 18   Height:      Weight: 183 lb 11.2 oz (83.326 kg)   184 lb 3.2 oz (83.553 kg)  SpO2: 98% 98% 98% 98%    Intake/Output Summary (Last 24 hours) at 08/08/15 0736 Last data filed at 08/08/15 0622  Gross per 24 hour  Intake    840 ml  Output   2150 ml  Net  -1310 ml    LABS: Basic Metabolic Panel:  Recent Labs  08/07/15 0320 08/08/15 0343  NA 133* 135  K 3.9 3.8  CL 96* 100*  CO2 26 27  GLUCOSE 99 109*  BUN 21* 20  CREATININE 0.96 1.03  CALCIUM 9.3 9.3   Liver Function Tests: No results for input(s): AST, ALT, ALKPHOS, BILITOT, PROT, ALBUMIN in the last 72 hours. No results for input(s): LIPASE, AMYLASE in the last 72 hours. CBC:  Recent Labs  08/07/15 0320 08/08/15 0343  WBC 7.6 7.1  HGB 10.2* 10.2*  HCT 32.1* 31.5*  MCV 88.9 88.2  PLT 165 178   Cardiac Enzymes: No results for input(s): CKTOTAL, CKMB, CKMBINDEX, TROPONINI in the last 72 hours. BNP: Invalid  input(s): POCBNP D-Dimer: No results for input(s): DDIMER in the last 72 hours. Hemoglobin A1C: No results for input(s): HGBA1C in the last 72 hours. Fasting Lipid Panel: No results for input(s): CHOL, HDL, LDLCALC, TRIG, CHOLHDL, LDLDIRECT in the last 72 hours. Thyroid Function Tests: No results for input(s): TSH, T4TOTAL, T3FREE, THYROIDAB in the last 72 hours.  Invalid input(s): FREET3 Anemia Panel: No results for input(s): VITAMINB12, FOLATE, FERRITIN, TIBC, IRON, RETICCTPCT in the last 72 hours.  RADIOLOGY: Dg Chest Port 1 View  08/02/2015  CLINICAL DATA:  81 year old male with shortness of breath and abnormal pulmonary auscultation. Initial encounter. EXAM: PORTABLE CHEST 1 VIEW COMPARISON:  10/09/2013. FINDINGS: Portable AP semi upright view at 2329 hours. Diffuse pulmonary interstitial opacity. Cardiomegaly appears increased since 2014. Other mediastinal contours  are within normal limits. Small right pleural effusion suspected. No pneumothorax. Patchy and confluent retrocardiac opacity, favor atelectasis. IMPRESSION: Acute pulmonary edema. Small right effusion. Cardiomegaly appears increased since 2014. Electronically Signed   By: Genevie Ann M.D.   On: 08/02/2015 23:39   Henry Neal  08/05/2015  CLINICAL DATA:  Ischemic cardiomyopathy, assess for viability and LV thrombus. EXAM: CARDIAC MRI TECHNIQUE: The patient was scanned on a 1.5 Tesla GE magnet. A dedicated cardiac coil was used. Functional imaging was done using Fiesta sequences. 2,3, and 4 chamber views were done to assess for RWMA's. Modified Simpson's rule using a short axis stack was used to calculate an ejection fraction on a dedicated work Conservation officer, nature. The patient received 30 cc of Multihance. After 10 minutes inversion recovery sequences were used to assess for infiltration and scar tissue. CONTRAST:  30 cc Multihance FINDINGS: There were small bilateral pleural effusions. Severely dilated LV with severe global hypokinesis, EF 22%. There was septal-lateral dyssynchrony. No LV thrombus noted. Normal right ventricular size and systolic function. Mild left atrial enlargement, normal right atrial size. Trileaflet aortic valve with no stenosis or regurgitation. Probably mild mitral regurgitation. On delayed enhancement imaging, the late gadolinium enhancement pattern (LGE) was as follows: - Small area < 50% thickness subendocardial LGE in the basal inferolateral wall. - Small area 76-99% thickness subendocardial LGE in the apical anterior wall. - About 50% thickness subendocardial LGE in the mid to apical anteroseptal wall. MEASUREMENTS: MEASUREMENTS LV EDV:  375 mL LV SV:  81 mL LV EF: 22% IMPRESSION: 1. Severely dilated LV with severely decreased systolic function, EF 09% with septal-lateral dyssynchrony. 2.  No LV thrombus. 3.  Normal RV size and systolic function. 4. LGE pattern  suggesting that there may be significant viable tissue in the LAD territory. Henry Neal Electronically Signed   By: Loralie Champagne M.D.   On: 08/05/2015 17:40    PHYSICAL EXAM General: NAD. In bed  Neck: JVP 7-8 Neal, no thyromegaly or thyroid nodule.  Lungs: Mildly decreased breath sounds bilaterally. On 2 liters [place] CV: Nondisplaced PMI.  Heart regular S1/S2, no S3/S4, no murmur.  No peripheral edema.  No carotid bruit.  Decreased pedal pulses bilaterally.  Abdomen: Soft, nontender, no hepatosplenomegaly, no distention.  Neurologic: Alert and oriented x 3.  Psych: Normal affect. Extremities: No clubbing or cyanosis. Right groin cath site benign.  Skin: Mild erythematous rash on back.   TELEMETRY: Sinus Loletha Grayer - Sinus Rhythm 50-60s  ASSESSMENT AND PLAN: 81 yo with history of AAA s/p repair, known CAD, prior R CEA, and chronic LBBB presented with acute onset severe dyspnea and epigstric pain.  He has had some exertional dyspnea  long-term. He was found to have acute pulmonary edema on CXR and has been diuresed.  Echo with EF 15%.  1. Acute on ?chronic systolic CHF:  Acute pulmonary edema at admission.  Echo this admission with EF 15% and multiple wall motion abnormalities.  Last prior assessment of EF was by LV-gram in 12/15, estimated 50%.  RHC with low to normal filling pressures and preserved cardiac output.  Suspect flash pulmonary edema in setting of ischemia/ACS with RCA plaque rupture.  CMRI 10/14 with EF 22%, no LV thrombus, RV ok, possible significant viability in LAD territory.  Volume status ok now.  - Cath today, continue current spironolactone, lisinopril and Coreg.  2. CAD: Known CAD, LHC in 12/15 with total occlusion mLAD, total occlusion OM1, 50-60% pRCA, 70% PDA.  NSTEMI this admission.  S/P LHC 08/04/15 with DES placed RCA for suspected RCA plaque rupture/thrombus.   - Continue plavix, ASA, and statin.  - Cardiac MRI to look for viability in the LAD territory => given marked  fall in EF, question whether PCI of LAD would be helpful.  The LAD was only subtotally occluded on 08/04/15 cath (as opposed to 12/15 cath) with at least a trickle of flow throughout its entirety.  MRI showed possible significant viability in the LAD territory.  I reviewed films this weekend with Dr Irish Lack, will post case for Dr Martinique to intervene on LAD today.  Gentle hydration pre-PCI. 3. Possible LV mural thrombus: Somewhat questionable by echo but not seen on MRI.  Stopped heparin gtt.   4. CKD: Creatinine improved.  5. Rash: Mild rash on back, new (seen by nurse).  Not particularly bothersome to him.  Will monitor, may be related to Plavix. For now, would not change meds.  If we need to, can switch to ticagrelor.  6. PAD: Patient reports calf pain with ambulation yesterday, decreased pedal pulses.  Will get ABIs while here.   Loralie Champagne 08/08/2015 7:36 AM

## 2015-08-08 NOTE — H&P (View-Only) (Signed)
Patient ID: Henry Neal, male   DOB: [birth year], 81 y.o.   MRN: [id number]     SUBJECTIVE: Henry Neal went to the ER in the evening of 10/11 with profound dyspnea and epigastric discomfort.    He came to the ER and was put on Bipap, IV NTG, and was diuresed.  CXR showed pulmonary edema.  Echo showed EF down to 15% with possible apical thrombus.    RHC/LHC 10/13 1. Low to normal left and right heart filling pressures with preserved cardiac output.  2. The appearance of the left coronary system is similar to 12/15, the difference being that the LAD is now only subtotally occluded with some flow throughout its entirety. 3. 80-90% hazy stenosis proximal RCA consistent with thrombus.  DES placed to RCA  Cardiac MRI (10/14): EF 22%, normal RV, no LV thrombus, possible significant viability in LAD territory.   Today he is doing well.  No chest pain, no dyspnea.  Walked with PT yesterday.  Legs are weak, some pain in calves with walking.   Scheduled Meds: . antiseptic oral rinse  7 mL Mouth Rinse BID  . aspirin  81 mg Oral Daily  . atorvastatin  40 mg Oral QHS  . carvedilol  3.125 mg Oral BID WC  . chlorhexidine  15 mL Mouth Rinse BID  . clopidogrel  75 mg Oral Q breakfast  . finasteride  5 mg Oral Daily  . gabapentin  300 mg Oral BID  . heparin subcutaneous  5,000 Units Subcutaneous 3 times per day  . isosorbide mononitrate  30 mg Oral Daily  . lisinopril  2.5 mg Oral Daily  . pantoprazole  40 mg Oral Daily  . sodium chloride  3 mL Intravenous Q12H  . sodium chloride  3 mL Intravenous Q12H  . spironolactone  12.5 mg Oral Daily   Continuous Infusions: . sodium chloride 1 mL/kg/hr (08/08/15 0724)   PRN Meds:.sodium chloride, sodium chloride, acetaminophen, alum & mag hydroxide-simeth, ondansetron (ZOFRAN) IV, ondansetron, senna, sodium chloride, sodium chloride    Filed Vitals:   08/07/15 0500 08/07/15 1139 08/07/15 2049 08/08/15 0534  BP: 116/55 145/60 104/51 112/39  Pulse: 57 71  64 70  Temp: 98.4 F (36.9 C) 98.1 F (36.7 C) 98.3 F (36.8 C) 98 F (36.7 C)  TempSrc: Oral Oral Oral Oral  Resp: 18 18 17 18   Height:      Weight: 183 lb 11.2 oz (83.326 kg)   184 lb 3.2 oz (83.553 kg)  SpO2: 98% 98% 98% 98%    Intake/Output Summary (Last 24 hours) at 08/08/15 0736 Last data filed at 08/08/15 0622  Gross per 24 hour  Intake    840 ml  Output   2150 ml  Net  -1310 ml    LABS: Basic Metabolic Panel:  Recent Labs  08/07/15 0320 08/08/15 0343  NA 133* 135  K 3.9 3.8  CL 96* 100*  CO2 26 27  GLUCOSE 99 109*  BUN 21* 20  CREATININE 0.96 1.03  CALCIUM 9.3 9.3   Liver Function Tests: No results for input(s): AST, ALT, ALKPHOS, BILITOT, PROT, ALBUMIN in the last 72 hours. No results for input(s): LIPASE, AMYLASE in the last 72 hours. CBC:  Recent Labs  08/07/15 0320 08/08/15 0343  WBC 7.6 7.1  HGB 10.2* 10.2*  HCT 32.1* 31.5*  MCV 88.9 88.2  PLT 165 178   Cardiac Enzymes: No results for input(s): CKTOTAL, CKMB, CKMBINDEX, TROPONINI in the last 72 hours. BNP: Invalid  input(s): POCBNP D-Dimer: No results for input(s): DDIMER in the last 72 hours. Hemoglobin A1C: No results for input(s): HGBA1C in the last 72 hours. Fasting Lipid Panel: No results for input(s): CHOL, HDL, LDLCALC, TRIG, CHOLHDL, LDLDIRECT in the last 72 hours. Thyroid Function Tests: No results for input(s): TSH, T4TOTAL, T3FREE, THYROIDAB in the last 72 hours.  Invalid input(s): FREET3 Anemia Panel: No results for input(s): VITAMINB12, FOLATE, FERRITIN, TIBC, IRON, RETICCTPCT in the last 72 hours.  RADIOLOGY: Dg Chest Port 1 View  08/02/2015  CLINICAL DATA:  81 year old male with shortness of breath and abnormal pulmonary auscultation. Initial encounter. EXAM: PORTABLE CHEST 1 VIEW COMPARISON:  10/09/2013. FINDINGS: Portable AP semi upright view at 2329 hours. Diffuse pulmonary interstitial opacity. Cardiomegaly appears increased since 2014. Other mediastinal contours  are within normal limits. Small right pleural effusion suspected. No pneumothorax. Patchy and confluent retrocardiac opacity, favor atelectasis. IMPRESSION: Acute pulmonary edema. Small right effusion. Cardiomegaly appears increased since 2014. Electronically Signed   By: Genevie Ann M.D.   On: 08/02/2015 23:39   Henry Card Morphology Wo/w Cm  08/05/2015  CLINICAL DATA:  Ischemic cardiomyopathy, assess for viability and LV thrombus. EXAM: CARDIAC MRI TECHNIQUE: The patient was scanned on a 1.5 Tesla GE magnet. A dedicated cardiac coil was used. Functional imaging was done using Fiesta sequences. 2,3, and 4 chamber views were done to assess for RWMA's. Modified Simpson's rule using a short axis stack was used to calculate an ejection fraction on a dedicated work Conservation officer, nature. The patient received 30 cc of Multihance. After 10 minutes inversion recovery sequences were used to assess for infiltration and scar tissue. CONTRAST:  30 cc Multihance FINDINGS: There were small bilateral pleural effusions. Severely dilated LV with severe global hypokinesis, EF 22%. There was septal-lateral dyssynchrony. No LV thrombus noted. Normal right ventricular size and systolic function. Mild left atrial enlargement, normal right atrial size. Trileaflet aortic valve with no stenosis or regurgitation. Probably mild mitral regurgitation. On delayed enhancement imaging, the late gadolinium enhancement pattern (LGE) was as follows: - Small area < 50% thickness subendocardial LGE in the basal inferolateral wall. - Small area 76-99% thickness subendocardial LGE in the apical anterior wall. - About 50% thickness subendocardial LGE in the mid to apical anteroseptal wall. MEASUREMENTS: MEASUREMENTS LV EDV:  375 mL LV SV:  81 mL LV EF: 22% IMPRESSION: 1. Severely dilated LV with severely decreased systolic function, EF 40% with septal-lateral dyssynchrony. 2.  No LV thrombus. 3.  Normal RV size and systolic function. 4. LGE pattern  suggesting that there may be significant viable tissue in the LAD territory. Jared Cahn Electronically Signed   By: Loralie Champagne M.D.   On: 08/05/2015 17:40    PHYSICAL EXAM General: NAD. In bed  Neck: JVP 7-8 cm, no thyromegaly or thyroid nodule.  Lungs: Mildly decreased breath sounds bilaterally. On 2 liters [place] CV: Nondisplaced PMI.  Heart regular S1/S2, no S3/S4, no murmur.  No peripheral edema.  No carotid bruit.  Decreased pedal pulses bilaterally.  Abdomen: Soft, nontender, no hepatosplenomegaly, no distention.  Neurologic: Alert and oriented x 3.  Psych: Normal affect. Extremities: No clubbing or cyanosis. Right groin cath site benign.  Skin: Mild erythematous rash on back.   TELEMETRY: Sinus Loletha Grayer - Sinus Rhythm 50-60s  ASSESSMENT AND PLAN: 81 yo with history of AAA s/p repair, known CAD, prior R CEA, and chronic LBBB presented with acute onset severe dyspnea and epigstric pain.  He has had some exertional dyspnea  long-term. He was found to have acute pulmonary edema on CXR and has been diuresed.  Echo with EF 15%.  1. Acute on ?chronic systolic CHF:  Acute pulmonary edema at admission.  Echo this admission with EF 15% and multiple wall motion abnormalities.  Last prior assessment of EF was by LV-gram in 12/15, estimated 50%.  RHC with low to normal filling pressures and preserved cardiac output.  Suspect flash pulmonary edema in setting of ischemia/ACS with RCA plaque rupture.  CMRI 10/14 with EF 22%, no LV thrombus, RV ok, possible significant viability in LAD territory.  Volume status ok now.  - Cath today, continue current spironolactone, lisinopril and Coreg.  2. CAD: Known CAD, LHC in 12/15 with total occlusion mLAD, total occlusion OM1, 50-60% pRCA, 70% PDA.  NSTEMI this admission.  S/P LHC 08/04/15 with DES placed RCA for suspected RCA plaque rupture/thrombus.   - Continue plavix, ASA, and statin.  - Cardiac MRI to look for viability in the LAD territory => given marked  fall in EF, question whether PCI of LAD would be helpful.  The LAD was only subtotally occluded on 08/04/15 cath (as opposed to 12/15 cath) with at least a trickle of flow throughout its entirety.  MRI showed possible significant viability in the LAD territory.  I reviewed films this weekend with Dr Irish Lack, will post case for Dr [place] to intervene on LAD today.  Gentle hydration pre-PCI. 3. Possible LV mural thrombus: Somewhat questionable by echo but not seen on MRI.  Stopped heparin gtt.   4. CKD: Creatinine improved.  5. Rash: Mild rash on back, new (seen by nurse).  Not particularly bothersome to him.  Will monitor, may be related to Plavix. For now, would not change meds.  If we need to, can switch to ticagrelor.  6. PAD: Patient reports calf pain with ambulation yesterday, decreased pedal pulses.  Will get ABIs while here.   Loralie Champagne 08/08/2015 7:36 AM

## 2015-08-08 NOTE — Interval H&P Note (Signed)
History and Physical Interval Note:  08/08/2015 10:23 AM  [name]  has presented today for surgery, with the diagnosis of CAD  The various methods of treatment have been discussed with the patient and family. After consideration of risks, benefits and other options for treatment, the patient has consented to  Procedure(s): Coronary Stent Intervention (N/A) as a surgical intervention .  The patient's history has been reviewed, patient examined, no change in status, stable for surgery.  I have reviewed the patient's chart and labs.  Questions were answered to the patient's satisfaction.    Cath Lab Visit (complete for each Cath Lab visit)  Clinical Evaluation Leading to the Procedure:   ACS: No.  Non-ACS:    Anginal Classification: CCS III  Anti-ischemic medical therapy: Maximal Therapy (2 or more classes of medications)  Non-Invasive Test Results: High-risk stress test findings: cardiac mortality >3%/year  Prior CABG: No previous CABG       Collier [facility]ina [facility] 08/08/2015 10:23 AM

## 2015-08-09 ENCOUNTER — Inpatient Hospital Stay (HOSPITAL_COMMUNITY): Payer: Medicare Other

## 2015-08-09 ENCOUNTER — Encounter (HOSPITAL_COMMUNITY): Payer: Self-pay

## 2015-08-09 DIAGNOSIS — I739 Peripheral vascular disease, unspecified: Secondary | ICD-10-CM

## 2015-08-09 DIAGNOSIS — I5023 Acute on chronic systolic (congestive) heart failure: Secondary | ICD-10-CM

## 2015-08-09 LAB — BASIC METABOLIC PANEL
Anion gap: 9 (ref 5–15)
BUN: 19 mg/dL (ref 6–20)
CHLORIDE: 101 mmol/L (ref 101–111)
CO2: 23 mmol/L (ref 22–32)
CREATININE: 0.84 mg/dL (ref 0.61–1.24)
Calcium: 9 mg/dL (ref 8.9–10.3)
GFR calc Af Amer: >60 mL/min (ref >60)
GFR calc non Af Amer: >60 mL/min (ref >60)
GLUCOSE: 111 mg/dL — AB (ref 65–99)
POTASSIUM: 4 mmol/L (ref 3.5–5.1)
Sodium: 133 mmol/L — ABNORMAL LOW (ref 135–145)

## 2015-08-09 LAB — CBC
HEMATOCRIT: 30.8 % — AB (ref 39.0–52.0)
Hemoglobin: 10.1 g/dL — ABNORMAL LOW (ref 13.0–17.0)
MCH: 29.1 pg (ref 26.0–34.0)
MCHC: 32.8 g/dL (ref 30.0–36.0)
MCV: 88.8 fL (ref 78.0–100.0)
PLATELETS: 173 10*3/uL (ref 150–400)
RBC: 3.47 MIL/uL — ABNORMAL LOW (ref 4.22–5.81)
RDW: 14.4 % (ref 11.5–15.5)
WBC: 7.3 10*3/uL (ref 4.0–10.5)

## 2015-08-09 MED ORDER — ISOSORBIDE MONONITRATE ER 30 MG PO TB24: 30.0000 mg | ORAL_TABLET | Freq: Every day | ORAL | Status: DC

## 2015-08-09 MED ORDER — CLOPIDOGREL BISULFATE 75 MG PO TABS: 75.0000 mg | ORAL_TABLET | Freq: Every day | ORAL | Status: DC

## 2015-08-09 MED ORDER — CARVEDILOL 3.125 MG PO TABS: 3.1250 mg | ORAL_TABLET | Freq: Two times a day (BID) | ORAL | Status: DC

## 2015-08-09 MED ORDER — SPIRONOLACTONE 25 MG PO TABS: 25.0000 mg | ORAL_TABLET | Freq: Every day | ORAL | Status: DC

## 2015-08-09 MED ORDER — LISINOPRIL 5 MG PO TABS: 5.0000 mg | ORAL_TABLET | Freq: Every day | ORAL | Status: DC

## 2015-08-09 MED ORDER — ATORVASTATIN CALCIUM 40 MG PO TABS: 40.0000 mg | ORAL_TABLET | Freq: Every day | ORAL | Status: AC

## 2015-08-09 MED ORDER — LISINOPRIL 5 MG PO TABS
5.0000 mg | ORAL_TABLET | Freq: Every day | ORAL | Status: DC
  Administered 2015-08-09: 10:00:00 5 mg via ORAL
  Filled 2015-08-09: qty 1

## 2015-08-09 MED ORDER — SPIRONOLACTONE 25 MG PO TABS
25.0000 mg | ORAL_TABLET | Freq: Every day | ORAL | Status: DC
  Administered 2015-08-09: 25 mg via ORAL
  Filled 2015-08-09: qty 1

## 2015-08-09 MED ORDER — SENNA 8.6 MG PO TABS: 2.0000 | ORAL_TABLET | Freq: Every day | ORAL | Status: DC | PRN

## 2015-08-09 NOTE — Progress Notes (Signed)
CARDIAC REHAB PHASE I   PRE:  Rate/Rhythm: 61 SR  BP:  Sitting: 118/45        SaO2: 98 RA  MODE:  Ambulation: 500 ft   POST:  Rate/Rhythm: 96 SR  BP:  Sitting: 139/61         SaO2: 98 RA  Pt ambulated 500 ft on RA, handheld assist, rolling walker, fairly steady gait, tolerated well.  Pt c/o of leg pain (chronic), denies cp, dizziness, DOE, declined rest stop. Pt states he feels more steady ambulating with a walker, does not have one at home, RN aware to notify case management for potential rolling walker for home use. Completed MI/stent/CHF education.  Reviewed risk factors, anti-platelet therapy, stent card, activity restrictions, ntg, exercise, heart healthy diet, sodium and fluid restrictions, daily weights, HF booklet and zone tool, and phase 2 cardiac rehab. Referral sent for phase 2 cardiac rehab to [facility]. Pt verbalized understanding of education, states he has watched the heart failure video. Pt states he knows he will have to make a lot of changes to his diet, states he eats a hot dog for dinner almost every night. Pt receptive to education, will benefit from reinforcement prior to d/c.  Pt to recliner after walk, call bell within reach.   [id number]   Lenna Sciara, RN, BSN 08/09/2015 9:29 AM

## 2015-08-09 NOTE — Progress Notes (Addendum)
Patient ID: Henry Neal, male   DOB: 1927-08-26, 81 y.o.   MRN: [id number]     SUBJECTIVE: Henry Neal went to the ER in the evening of 10/11 with profound dyspnea and epigastric discomfort.    He came to the ER and was put on Bipap, IV NTG, and was diuresed.  CXR showed pulmonary edema.  Echo showed EF down to 15% with possible apical thrombus.    RHC/LHC 10/13 1. Low to normal left and right heart filling pressures with preserved cardiac output.  2. The appearance of the left coronary system is similar to 12/15, the difference being that the LAD is now only subtotally occluded with some flow throughout its entirety. 3. 80-90% hazy stenosis proximal RCA consistent with thrombus.  DES placed to RCA  Cardiac MRI (10/14): EF 22%, normal RV, no LV thrombus, possible significant viability in LAD territory.   PCI to subtotal proximal-mid LAD occlusion on 10/17 with Promus DES.   Today, says he does not feel subjectively as good as he did yesterday.  Hard to pinpoint any one issue.  Says he is not short of breath.  Had some RUQ pain earlier that he thinks was gas.   Scheduled Meds: . antiseptic oral rinse  7 mL Mouth Rinse BID  . aspirin  81 mg Oral Daily  . atorvastatin  40 mg Oral QHS  . carvedilol  3.125 mg Oral BID WC  . chlorhexidine  15 mL Mouth Rinse BID  . clopidogrel  75 mg Oral Q breakfast  . finasteride  5 mg Oral Daily  . gabapentin  300 mg Oral BID  . heparin subcutaneous  5,000 Units Subcutaneous 3 times per day  . isosorbide mononitrate  30 mg Oral Daily  . lisinopril  5 mg Oral Daily  . pantoprazole  40 mg Oral Daily  . spironolactone  25 mg Oral Daily   Continuous Infusions:   PRN Meds:.acetaminophen, alum & mag hydroxide-simeth, ondansetron, senna    Filed Vitals:   08/08/15 1545 08/08/15 1730 08/08/15 1927 08/09/15 0422  BP: 143/58 126/37 130/55 116/50  Pulse: 66  69 67  Temp: 98 F (36.7 C)  98.2 F (36.8 C) 98.4 F (36.9 C)  TempSrc: Oral  Oral Oral    Resp: 19 14 13 18   Height:      Weight:    184 lb 1.4 oz (83.5 kg)  SpO2: 100%  98% 95%    Intake/Output Summary (Last 24 hours) at 08/09/15 0820 Last data filed at 08/09/15 0400  Gross per 24 hour  Intake    240 ml  Output   1350 ml  Net  -1110 ml    LABS: Basic Metabolic Panel:  Recent Labs  08/08/15 0343 08/09/15 0512  NA 135 133*  K 3.8 4.0  CL 100* 101  CO2 27 23  GLUCOSE 109* 111*  BUN 20 19  CREATININE 1.03 0.84  CALCIUM 9.3 9.0   Liver Function Tests: No results for input(s): AST, ALT, ALKPHOS, BILITOT, PROT, ALBUMIN in the last 72 hours. No results for input(s): LIPASE, AMYLASE in the last 72 hours. CBC:  Recent Labs  08/08/15 0343 08/09/15 0512  WBC 7.1 7.3  HGB 10.2* 10.1*  HCT 31.5* 30.8*  MCV 88.2 88.8  PLT 178 173   Cardiac Enzymes: No results for input(s): CKTOTAL, CKMB, CKMBINDEX, TROPONINI in the last 72 hours. BNP: Invalid input(s): POCBNP D-Dimer: No results for input(s): DDIMER in the last 72 hours. Hemoglobin A1C: No results  for input(s): HGBA1C in the last 72 hours. Fasting Lipid Panel: No results for input(s): CHOL, HDL, LDLCALC, TRIG, CHOLHDL, LDLDIRECT in the last 72 hours. Thyroid Function Tests: No results for input(s): TSH, T4TOTAL, T3FREE, THYROIDAB in the last 72 hours.  Invalid input(s): FREET3 Anemia Panel: No results for input(s): VITAMINB12, FOLATE, FERRITIN, TIBC, IRON, RETICCTPCT in the last 72 hours.  RADIOLOGY: Dg Chest Port 1 View  08/02/2015  CLINICAL DATA:  81 year old male with shortness of breath and abnormal pulmonary auscultation. Initial encounter. EXAM: PORTABLE CHEST 1 VIEW COMPARISON:  10/09/2013. FINDINGS: Portable AP semi upright view at 2329 hours. Diffuse pulmonary interstitial opacity. Cardiomegaly appears increased since 2014. Other mediastinal contours are within normal limits. Small right pleural effusion suspected. No pneumothorax. Patchy and confluent retrocardiac opacity, favor atelectasis.  IMPRESSION: Acute pulmonary edema. Small right effusion. Cardiomegaly appears increased since 2014. Electronically Signed   By: Genevie Ann M.D.   On: 08/02/2015 23:39   Henry Neal Wo/w Cm  08/05/2015  CLINICAL DATA:  Ischemic cardiomyopathy, assess for viability and LV thrombus. EXAM: CARDIAC MRI TECHNIQUE: The patient was scanned on a 1.5 Tesla GE magnet. A dedicated cardiac coil was used. Functional imaging was done using Fiesta sequences. 2,3, and 4 chamber views were done to assess for RWMA's. Modified Simpson's rule using a short axis stack was used to calculate an ejection fraction on a dedicated work Conservation officer, nature. The patient received 30 cc of Multihance. After 10 minutes inversion recovery sequences were used to assess for infiltration and scar tissue. CONTRAST:  30 cc Multihance FINDINGS: There were small bilateral pleural effusions. Severely dilated LV with severe global hypokinesis, EF 22%. There was septal-lateral dyssynchrony. No LV thrombus noted. Normal right ventricular size and systolic function. Mild left atrial enlargement, normal right atrial size. Trileaflet aortic valve with no stenosis or regurgitation. Probably mild mitral regurgitation. On delayed enhancement imaging, the late gadolinium enhancement pattern (LGE) was as follows: - Small area < 50% thickness subendocardial LGE in the basal inferolateral wall. - Small area 76-99% thickness subendocardial LGE in the apical anterior wall. - About 50% thickness subendocardial LGE in the mid to apical anteroseptal wall. MEASUREMENTS: MEASUREMENTS LV EDV:  375 mL LV SV:  81 mL LV EF: 22% IMPRESSION: 1. Severely dilated LV with severely decreased systolic function, EF 62% with septal-lateral dyssynchrony. 2.  No LV thrombus. 3.  Normal RV size and systolic function. 4. LGE pattern suggesting that there may be significant viable tissue in the LAD territory. Azaleah Usman Electronically Signed   By: Loralie Champagne M.D.   On:  08/05/2015 17:40    PHYSICAL EXAM General: NAD. In bed  Neck: JVP 7-8 cm, no thyromegaly or thyroid nodule.  Lungs: Mildly decreased breath sounds bilaterally. On 2 liters [place] CV: Nondisplaced PMI.  Heart regular S1/S2, no S3/S4, no murmur.  No peripheral edema.  No carotid bruit.  Decreased pedal pulses bilaterally.  Abdomen: Soft, nontender, no hepatosplenomegaly, no distention.  Neurologic: Alert and oriented x 3.  Psych: Normal affect. Extremities: No clubbing or cyanosis. Right groin cath site benign.  Skin: Mild erythematous rash on back, fairly localized and crosses midline.   TELEMETRY: Sinus Loletha Grayer - Sinus Rhythm 50-60s  ASSESSMENT AND PLAN: 81 yo with history of AAA s/p repair, known CAD, prior R CEA, and chronic LBBB presented with acute onset severe dyspnea and epigstric pain.  He has had some exertional dyspnea long-term. He was found to have acute pulmonary edema on CXR and  has been diuresed.  Echo with EF 15%.  1. Acute on ?chronic systolic CHF:  Acute pulmonary edema at admission.  Echo this admission with EF 15% and multiple wall motion abnormalities.  Last prior assessment of EF was by LV-gram in 12/15, estimated 50%.  RHC with low to normal filling pressures and preserved cardiac output.  Suspect flash pulmonary edema in setting of ischemia/ACS with RCA plaque rupture.  CMRI 10/14 with EF 22%, no LV thrombus, RV ok, possible significant viability in LAD territory.  Volume status ok now.  - Continue current Coreg. - Can increase spironolactone to 25 mg daily and lisinopril to 5 mg daily.  2. CAD: Known CAD, LHC in 12/15 with total occlusion mLAD, total occlusion OM1, 50-60% pRCA, 70% PDA.  NSTEMI this admission.  S/P LHC 08/04/15 with DES placed RCA for suspected RCA plaque rupture/thrombus.  He had DES to subtotal LAD occlusion on 10/17 given viability on MRI in LAD territory.  - Continue plavix, ASA, and statin.  3. Possible LV mural thrombus: Questionable by echo but not  seen on MRI.  Stopped anticoagulation.   4. CKD: Creatinine improved.  5. Rash: Mild rash on back, new.  Some pruritis.  Fairly localized and crosses midline so suspect not Zoster.  Will give him Atarax for itching as needed.   Will monitor, may be related to Plavix. For now, would not change meds.  If the rash does not resolve soon on its own, can switch to ticagrelor.  6. PAD: Patient reports calf pain with ambulation yesterday, decreased pedal pulses.  Will get ABIs while here (planned for this morning).  7. Disposition: Walk with cardiac rehab this morning.  If he is doing ok this morning, will let him go home later on today.  Continue current cardiac meds as above.  Will need cardiac rehab as outpatient.  Will need followup with me in CHF clinic in 10-14 days.   Loralie Champagne 08/09/2015 8:20 AM

## 2015-08-09 NOTE — Discharge Instructions (Addendum)
° °  STOP TAKING THE MEDICATIONS:  IRBESARTIN (AVAPRO) TRIAMTERENE-HYDROCHLOROTHIAZIDE (MAXIDE)

## 2015-08-09 NOTE — Discharge Summary (Signed)
Advanced Heart Failure Team  Discharge Summary   Patient ID: Henry Neal MRN: [id number], DOB/AGE: [birth year] 81 y.o. Admit date: 08/02/2015 D/C date:     08/09/2015   Primary Discharge Diagnoses:  1. Acute on chronic systolic CHF - Echo 51/02/58 LVEF 15% with multiple wall motion abnormalties  2. CAD 3. Possible LV mural thrombus - Placed on anticoag, but d/c'd when CMRI showed no thrombus. 4. AKI on CKD stage II 5. Rash - ? Related to plavix 6. PAD - ABIs 08/09/15  Hospital Course:   Henry Neal is a 81 y.o. male with hx of AAA s/p repair, known CAD, prior R CEA, and chronic LBB who presented to [facility] on 08/02/15 with profound dyspnea and epigastric pain.   He diuresed well on IV lasix.  Echo showed EF of 15% with multiple wall abnormalities (down from 50% in 10/2013) and possible LV thrombus. Started on anticoag for ? LV thrombus. Cause of exacerbation was unclear so scheduled for [facility].     RHC with low to normal filling pressures and preserved cardiac output.  LHC 08/04/15 with DES placed RCA with suspected RCA plaque rupture/thrombus. CMRI 08/05/15 with EF 22%, no LV thrombus, RV ok, possible significant viability in LAD territory. Anticoag stopped.   Hospital course was complicate but suspected RCA rupture as above and NSTEMI. He returned to the cath lab for further stenting on 08/08/15 with DES to subtotal LAD occlusion.  Pt also developed a rash on his back that may be related to plavix.  Will monitor closely but leave on for now with new stents. Pt began complaining of calf pain and had decreased pedal pulses 08/08/15, so will get ABIs prior to d/c.  Overall he diuresed 10 L and 6 lbs on IV lasix.  He will be discharged home stable on his current po medications and will have close follow up with the HF clinic as below. He will need cardiac rehab as an outpatient.  Discharge Weight Range:  184 lbs Discharge Vitals: Blood pressure 97/45, pulse 81, temperature 98 F (36.7 C),  temperature source Oral, resp. rate 18, height 5\' 10"  (1.778 m), weight 184 lb 1.4 oz (83.5 kg), SpO2 94 %.  Labs: Lab Results  Component Value Date   WBC 7.3 08/09/2015   HGB 10.1* 08/09/2015   HCT 30.8* 08/09/2015   MCV 88.8 08/09/2015   PLT 173 08/09/2015     Recent Labs Lab 08/09/15 0512  NA 133*  K 4.0  CL 101  CO2 23  BUN 19  CREATININE 0.84  CALCIUM 9.0  GLUCOSE 111*   Lab Results  Component Value Date   CHOL 105 08/03/2015   HDL 44 08/03/2015   LDLCALC 51 08/03/2015   TRIG 49 08/03/2015   BNP (last 3 results)  Recent Labs  08/02/15 2338  BNP 1953.0*    ProBNP (last 3 results) No results for input(s): PROBNP in the last 8760 hours.   Diagnostic Studies/Procedures   ECHO 08/03/15 LVEF 15%, Grade 1 DD ? LV thrombus RV mod dilated PA peak pressure 41 mm Hg  RHC/LHC 08/04/15 Hemodynamics (mmHg) RA mean 2 RV 30/2 PA 31/5, mean 15 PCWP mean 11  Oxygen saturations: PA 60% AO 89%  Cardiac Output (Fick) 6.88  Cardiac Index (Fick) 3.41  Cardiac Output (Thermo) 6.2 Cardiac Index (Thermo) 3.07  1. Low to normal left and right heart filling pressures with preserved cardiac output.  2. The appearance of the left coronary system is similar to 12/15,  the difference being that the LAD is now only subtotally occluded with some flow throughout its entirety. 3. 80-90% hazy stenosis proximal RCA consistent with thrombus. DES placed to RCA  Cardiac MRI 08/05/15: EF 22%, normal RV, no LV thrombus, possible significant viability in LAD territory.   LHC 08/08/15  Successful stenting of prox to mid LAD 99% stenosed with 0% residual stenosis.  Discharge Medications     Medication List    STOP taking these medications        irbesartan 150 MG tablet  Commonly known as:  AVAPRO     triamterene-hydrochlorothiazide 37.5-25 MG tablet  Commonly known as:  MAXZIDE-25      TAKE these medications        aspirin EC 81 MG tablet  Take 81 mg by mouth  daily.     atorvastatin 40 MG tablet  Commonly known as:  LIPITOR  Take 1 tablet (40 mg total) by mouth daily at 6 PM.     CALCIUM 600 + D PO  Take 1 tablet by mouth 2 (two) times daily.     carvedilol 3.125 MG tablet  Commonly known as:  COREG  Take 1 tablet (3.125 mg total) by mouth 2 (two) times daily with a meal.     clopidogrel 75 MG tablet  Commonly known as:  PLAVIX  Take 1 tablet (75 mg total) by mouth daily with breakfast.     diclofenac 50 MG EC tablet  Commonly known as:  VOLTAREN  Take 50 mg by mouth 2 (two) times daily.     finasteride 5 MG tablet  Commonly known as:  PROSCAR  take 1 tablet by mouth once daily     fluticasone 50 MCG/ACT nasal spray  Commonly known as:  FLONASE  Place 1 spray into both nostrils daily as needed for allergies.     gabapentin 300 MG capsule  Commonly known as:  NEURONTIN  Take 300 mg by mouth 2 (two) times daily.     isosorbide mononitrate 30 MG 24 hr tablet  Commonly known as:  IMDUR  Take 1 tablet (30 mg total) by mouth daily.     ketoconazole 2 % cream  Commonly known as:  NIZORAL  apply to affected area ON FACE ONCE DAILY prn     lisinopril 5 MG tablet  Commonly known as:  PRINIVIL,ZESTRIL  Take 1 tablet (5 mg total) by mouth daily.     meclizine 25 MG tablet  Commonly known as:  ANTIVERT  Take 25 mg by mouth 2 (two) times daily as needed for dizziness.     mometasone 0.1 % cream  Commonly known as:  ELOCON  Apply 1 application topically daily as needed (for irritation).     MULTIVITAMIN PO  Take 1 tablet by mouth daily. ALIVE men's vitamin     NITROSTAT 0.4 MG SL tablet  Generic drug:  nitroGLYCERIN  place 1 tablet under the tongue if needed every 5 minutes for chest pain CALL 911 AFTER 3 DOSES     pantoprazole 40 MG tablet  Commonly known as:  PROTONIX  Take 40 mg by mouth daily.     spironolactone 25 MG tablet  Commonly known as:  ALDACTONE  Take 1 tablet (25 mg total) by mouth daily.         Disposition   The patient will be discharged in stable condition to home. Discharge Instructions    Amb Referral to Cardiac Rehabilitation    Complete by:  As  directed   Diagnosis:   Myocardial Infarction PCI            Follow-up Information    Follow up with Loralie Champagne, MD On 08/24/2015.   Specialty:  Cardiology   Why:  at 1040 for post hospital follow up.  Please bring all of your medications with you to your visit. The patient code for parking is 0900.   Contact information:   [address] [place] [address] [phone]       Follow up with [facility].   Why:  arranged for  rolling walker to be delivered @ pt's bedside   Contact information:   [address] [place] [address] [phone]       Follow up with [facility].   Why:  RN and Home Health Physical Therapy arranged   Contact information:   [address] [place] [address] [phone]         Duration of Discharge Encounter: Greater than 35 minutes   Signed, Shirley Friar PA-C 08/09/2015, 12:44 PM

## 2015-08-09 NOTE — Progress Notes (Signed)
Physical Therapy Treatment Patient Details Name: Henry Neal MRN: [id number] DOB: [birth year] Today's Date: 08/09/2015    History of Present Illness Henry Neal went to the ER in the evening of 10/11 with profound dyspnea and epigastric discomfort. He came to the ER and was put on Bipap, IV NTG, and was diuresed. CXR showed pulmonary edema. Echo showed EF down to 15% with possible apical thrombus. Plan for cardiac cath 10/17    PT Comments    Pt functioning near baseline. Pt functioning at mod I/supervision level. Pt with improved ambulation toleance and stability with RW. Pt with good home set up and plans on going home with son at first. Spoke with RN regarding calling [facility] to let them know if he's going to his son's so they can change the address. Pt safe to d/c home once medically stable.  Follow Up Recommendations  Home health PT     Equipment Recommendations       Recommendations for Other Services       Precautions / Restrictions Precautions Precautions: Fall Precaution Comments: minimal Restrictions Weight Bearing Restrictions: Yes RUE Weight Bearing: Non weight bearing    Mobility  Bed Mobility               General bed mobility comments: pt received sitting up in chair  Transfers Overall transfer level: Needs assistance Equipment used: None Transfers: Sit to/from Stand Sit to Stand: Supervision            Ambulation/Gait Ambulation/Gait assistance: Supervision Ambulation Distance (Feet): 500 Feet Assistive device: Rolling walker (2 wheeled);None Gait Pattern/deviations: Step-through pattern Gait velocity: improved with RW   General Gait Details: pt able to amb without AD however with increased trunk flexion, dec pace and dec step length. With RW pt with improved posture and more fluid gait pattern. suggested pt use RW for long distance ambulation   Stairs Stairs: Yes Stairs assistance: Min guard Stair Management: One rail Right Number of  Stairs: 8 General stair comments: ascend reciprocally, step to when descending  Wheelchair Mobility    Modified Rankin (Stroke Patients Only)       Balance Overall balance assessment: No apparent balance deficits (not formally assessed)                                  Cognition Arousal/Alertness: Awake/alert Behavior During Therapy: WFL for tasks assessed/performed Overall Cognitive Status: Within Functional Limits for tasks assessed                      Exercises      General Comments        Pertinent Vitals/Pain Pain Assessment: Faces Faces Pain Scale: Hurts a little bit Pain Location: LE discomfot    Home Living                      Prior Function            PT Goals (current goals can now be found in the care plan section) Progress towards PT goals: Progressing toward goals    Frequency  Min 2X/week    PT Plan Discharge plan needs to be updated    Co-evaluation             End of Session Equipment Utilized During Treatment: Gait belt Activity Tolerance: Patient tolerated treatment well Patient left: in chair;with call bell/phone within reach  Time: 0383-3383 PT Time Calculation (min) (ACUTE ONLY): 18 min  Charges:  $Gait Training: 8-22 mins                    G Codes:      Henry Neal 08/09/2015, 3:15 PM  Henry Neal, PT, DPT Pager #: [phone] Office #: [phone]

## 2015-08-11 ENCOUNTER — Encounter (HOSPITAL_COMMUNITY): Payer: Self-pay

## 2015-08-12 ENCOUNTER — Encounter (HOSPITAL_COMMUNITY): Payer: Self-pay

## 2015-08-16 ENCOUNTER — Encounter (HOSPITAL_COMMUNITY): Payer: Self-pay | Admitting: Emergency Medicine

## 2015-08-16 ENCOUNTER — Observation Stay (HOSPITAL_COMMUNITY)
Admission: EM | Admit: 2015-08-16 | Discharge: 2015-08-17 | Disposition: A | Discharge status: Home / Self Care | Payer: No Typology Code available for payment source | Attending: Internal Medicine | Admitting: Internal Medicine

## 2015-08-16 ENCOUNTER — Emergency Department (HOSPITAL_COMMUNITY): Payer: No Typology Code available for payment source

## 2015-08-16 ENCOUNTER — Encounter (HOSPITAL_COMMUNITY): Payer: Self-pay

## 2015-08-16 DIAGNOSIS — E785 Hyperlipidemia, unspecified: Secondary | ICD-10-CM | POA: Insufficient documentation

## 2015-08-16 DIAGNOSIS — R0789 Other chest pain: Secondary | ICD-10-CM

## 2015-08-16 DIAGNOSIS — I2510 Atherosclerotic heart disease of native coronary artery without angina pectoris: Secondary | ICD-10-CM | POA: Diagnosis not present

## 2015-08-16 DIAGNOSIS — Z87891 Personal history of nicotine dependence: Secondary | ICD-10-CM | POA: Insufficient documentation

## 2015-08-16 DIAGNOSIS — Z79899 Other long term (current) drug therapy: Secondary | ICD-10-CM | POA: Insufficient documentation

## 2015-08-16 DIAGNOSIS — K219 Gastro-esophageal reflux disease without esophagitis: Secondary | ICD-10-CM | POA: Diagnosis not present

## 2015-08-16 DIAGNOSIS — I739 Peripheral vascular disease, unspecified: Secondary | ICD-10-CM | POA: Diagnosis not present

## 2015-08-16 DIAGNOSIS — N179 Acute kidney failure, unspecified: Secondary | ICD-10-CM | POA: Diagnosis not present

## 2015-08-16 DIAGNOSIS — D649 Anemia, unspecified: Secondary | ICD-10-CM | POA: Diagnosis not present

## 2015-08-16 DIAGNOSIS — Z955 Presence of coronary angioplasty implant and graft: Secondary | ICD-10-CM | POA: Diagnosis not present

## 2015-08-16 DIAGNOSIS — R079 Chest pain, unspecified: Secondary | ICD-10-CM | POA: Diagnosis not present

## 2015-08-16 DIAGNOSIS — I5022 Chronic systolic (congestive) heart failure: Secondary | ICD-10-CM | POA: Diagnosis not present

## 2015-08-16 DIAGNOSIS — I10 Essential (primary) hypertension: Secondary | ICD-10-CM | POA: Diagnosis present

## 2015-08-16 LAB — CBC WITH DIFFERENTIAL/PLATELET
BASOS ABS: 0 10*3/uL (ref 0.0–0.1)
Basophils Relative: 0 %
EOS ABS: 0.2 10*3/uL (ref 0.0–0.7)
Eosinophils Relative: 3 %
HCT: 29 % — ABNORMAL LOW (ref 39.0–52.0)
HEMOGLOBIN: 9.3 g/dL — AB (ref 13.0–17.0)
LYMPHS ABS: 1.8 10*3/uL (ref 0.7–4.0)
LYMPHS PCT: 26 %
MCH: 28.4 pg (ref 26.0–34.0)
MCHC: 32.1 g/dL (ref 30.0–36.0)
MCV: 88.4 fL (ref 78.0–100.0)
Monocytes Absolute: 0.7 10*3/uL (ref 0.1–1.0)
Monocytes Relative: 10 %
NEUTROS PCT: 61 %
Neutro Abs: 4.2 10*3/uL (ref 1.7–7.7)
Platelets: 187 10*3/uL (ref 150–400)
RBC: 3.28 MIL/uL — AB (ref 4.22–5.81)
RDW: 14.2 % (ref 11.5–15.5)
WBC: 7 10*3/uL (ref 4.0–10.5)

## 2015-08-16 LAB — I-STAT CHEM 8, ED
BUN: 34 mg/dL — AB (ref 6–20)
CALCIUM ION: 1.11 mmol/L — AB (ref 1.13–1.30)
CHLORIDE: 100 mmol/L — AB (ref 101–111)
Creatinine, Ser: 1.5 mg/dL — ABNORMAL HIGH (ref 0.61–1.24)
Glucose, Bld: 90 mg/dL (ref 65–99)
HCT: 31 % — ABNORMAL LOW (ref 39.0–52.0)
Hemoglobin: 10.5 g/dL — ABNORMAL LOW (ref 13.0–17.0)
POTASSIUM: 4.8 mmol/L (ref 3.5–5.1)
SODIUM: 131 mmol/L — AB (ref 135–145)
TCO2: 19 mmol/L (ref 0–100)

## 2015-08-16 LAB — BASIC METABOLIC PANEL
ANION GAP: 7 (ref 5–15)
BUN: 33 mg/dL — ABNORMAL HIGH (ref 6–20)
CHLORIDE: 103 mmol/L (ref 101–111)
CO2: 24 mmol/L (ref 22–32)
CREATININE: 1.39 mg/dL — AB (ref 0.61–1.24)
Calcium: 8.6 mg/dL — ABNORMAL LOW (ref 8.9–10.3)
GFR calc non Af Amer: 45 mL/min — ABNORMAL LOW (ref >60)
GFR, EST AFRICAN AMERICAN: 52 mL/min — AB (ref >60)
Glucose, Bld: 114 mg/dL — ABNORMAL HIGH (ref 65–99)
Potassium: 5 mmol/L (ref 3.5–5.1)
SODIUM: 134 mmol/L — AB (ref 135–145)

## 2015-08-16 LAB — TROPONIN I
TROPONIN I: 0.05 ng/mL — AB (ref <0.031)
Troponin I: 0.05 ng/mL — ABNORMAL HIGH (ref <0.031)
Troponin I: 0.04 ng/mL — ABNORMAL HIGH (ref <0.031)

## 2015-08-16 LAB — I-STAT TROPONIN, ED
Troponin i, poc: 0.03 ng/mL (ref 0.00–0.08)
Troponin i, poc: 0.05 ng/mL (ref 0.00–0.08)

## 2015-08-16 MED ORDER — GABAPENTIN 300 MG PO CAPS
300.0000 mg | ORAL_CAPSULE | Freq: Two times a day (BID) | ORAL | Status: DC
  Administered 2015-08-16 – 2015-08-17 (×3): 300 mg via ORAL
  Filled 2015-08-16 (×3): qty 1

## 2015-08-16 MED ORDER — CARVEDILOL 3.125 MG PO TABS
3.1250 mg | ORAL_TABLET | Freq: Two times a day (BID) | ORAL | Status: DC
  Administered 2015-08-16 – 2015-08-17 (×3): 3.125 mg via ORAL
  Filled 2015-08-16 (×3): qty 1

## 2015-08-16 MED ORDER — ISOSORBIDE MONONITRATE ER 30 MG PO TB24
30.0000 mg | ORAL_TABLET | Freq: Every day | ORAL | Status: DC
  Administered 2015-08-16 – 2015-08-17 (×2): 30 mg via ORAL
  Filled 2015-08-16 (×2): qty 1

## 2015-08-16 MED ORDER — GI COCKTAIL ~~LOC~~: 30.0000 mL | Freq: Four times a day (QID) | ORAL | Status: DC | PRN

## 2015-08-16 MED ORDER — SPIRONOLACTONE 25 MG PO TABS
25.0000 mg | ORAL_TABLET | Freq: Every day | ORAL | Status: DC
  Administered 2015-08-16 – 2015-08-17 (×2): 25 mg via ORAL
  Filled 2015-08-16 (×2): qty 1

## 2015-08-16 MED ORDER — ASPIRIN 81 MG PO CHEW
324.0000 mg | CHEWABLE_TABLET | Freq: Once | ORAL | Status: AC
  Administered 2015-08-16: 324 mg via ORAL
  Filled 2015-08-16: qty 4

## 2015-08-16 MED ORDER — PANTOPRAZOLE SODIUM 40 MG PO TBEC
40.0000 mg | DELAYED_RELEASE_TABLET | Freq: Every day | ORAL | Status: DC
  Administered 2015-08-16 – 2015-08-17 (×2): 40 mg via ORAL
  Filled 2015-08-16 (×2): qty 1

## 2015-08-16 MED ORDER — ACETAMINOPHEN 325 MG PO TABS: 650.0000 mg | ORAL_TABLET | ORAL | Status: DC | PRN

## 2015-08-16 MED ORDER — ASPIRIN EC 81 MG PO TBEC
81.0000 mg | DELAYED_RELEASE_TABLET | Freq: Every day | ORAL | Status: DC
Start: 2015-08-16 — End: 2015-08-17
  Administered 2015-08-16 – 2015-08-17 (×2): 81 mg via ORAL
  Filled 2015-08-16 (×2): qty 1

## 2015-08-16 MED ORDER — CLOPIDOGREL BISULFATE 75 MG PO TABS
75.0000 mg | ORAL_TABLET | Freq: Every day | ORAL | Status: DC
  Administered 2015-08-16 – 2015-08-17 (×2): 75 mg via ORAL
  Filled 2015-08-16 (×2): qty 1

## 2015-08-16 MED ORDER — SODIUM CHLORIDE 0.9 % IV BOLUS (SEPSIS): 500.0000 mL | Freq: Once | INTRAVENOUS | Status: DC

## 2015-08-16 MED ORDER — GI COCKTAIL ~~LOC~~
30.0000 mL | Freq: Once | ORAL | Status: AC
  Administered 2015-08-16: 30 mL via ORAL
  Filled 2015-08-16: qty 30

## 2015-08-16 MED ORDER — ONDANSETRON HCL 4 MG/2ML IJ SOLN: 4.0000 mg | Freq: Four times a day (QID) | INTRAMUSCULAR | Status: DC | PRN

## 2015-08-16 MED ORDER — FINASTERIDE 5 MG PO TABS
5.0000 mg | ORAL_TABLET | Freq: Every day | ORAL | Status: DC
  Administered 2015-08-16 – 2015-08-17 (×2): 5 mg via ORAL
  Filled 2015-08-16 (×2): qty 1

## 2015-08-16 MED ORDER — LISINOPRIL 5 MG PO TABS
5.0000 mg | ORAL_TABLET | Freq: Every day | ORAL | Status: DC
  Administered 2015-08-16 – 2015-08-17 (×2): 5 mg via ORAL
  Filled 2015-08-16 (×2): qty 1

## 2015-08-16 MED ORDER — ATORVASTATIN CALCIUM 40 MG PO TABS
40.0000 mg | ORAL_TABLET | Freq: Every day | ORAL | Status: DC
  Administered 2015-08-16: 40 mg via ORAL
  Filled 2015-08-16: qty 1
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
## 2015-08-16 NOTE — Progress Notes (Signed)
[facility]  Patient Status: Active (receiving services up to time of hospitalization)  [facility] is providing the following services: RN and PT  If patient discharges after hours, please call [phone].   Henry Neal 08/16/2015, 1:43 PM
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
## 2015-08-16 NOTE — ED Notes (Addendum)
Dr. Randal Buba and Jerene Pitch - RN aware of pt's BP
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
## 2015-08-16 NOTE — ED Notes (Signed)
MD to see and assess patient before RN assessment.

## 2015-08-16 NOTE — ED Notes (Signed)
Patient transported to CT 

## 2015-08-17 DIAGNOSIS — I739 Peripheral vascular disease, unspecified: Secondary | ICD-10-CM | POA: Diagnosis not present

## 2015-08-17 DIAGNOSIS — I10 Essential (primary) hypertension: Secondary | ICD-10-CM | POA: Diagnosis not present

## 2015-08-17 DIAGNOSIS — R0789 Other chest pain: Secondary | ICD-10-CM | POA: Diagnosis not present

## 2015-08-17 LAB — BASIC METABOLIC PANEL
ANION GAP: 11 (ref 5–15)
BUN: 29 mg/dL — ABNORMAL HIGH (ref 6–20)
CALCIUM: 9.1 mg/dL (ref 8.9–10.3)
CO2: 20 mmol/L — AB (ref 22–32)
Chloride: 104 mmol/L (ref 101–111)
Creatinine, Ser: 1.12 mg/dL (ref 0.61–1.24)
GFR, EST NON AFRICAN AMERICAN: 58 mL/min — AB (ref >60)
Glucose, Bld: 98 mg/dL (ref 65–99)
Potassium: 4.6 mmol/L (ref 3.5–5.1)
Sodium: 135 mmol/L (ref 135–145)

## 2015-08-17 LAB — CBC
HEMATOCRIT: 31.2 % — AB (ref 39.0–52.0)
Hemoglobin: 9.7 g/dL — ABNORMAL LOW (ref 13.0–17.0)
MCH: 27.8 pg (ref 26.0–34.0)
MCHC: 31.1 g/dL (ref 30.0–36.0)
MCV: 89.4 fL (ref 78.0–100.0)
Platelets: 185 10*3/uL (ref 150–400)
RBC: 3.49 MIL/uL — ABNORMAL LOW (ref 4.22–5.81)
RDW: 14.3 % (ref 11.5–15.5)
WBC: 6.4 10*3/uL (ref 4.0–10.5)
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
## 2015-08-17 NOTE — Evaluation (Signed)
Physical Therapy Evaluation Patient Details Name: Henry Neal MRN: [id number] DOB: 18-May-1927 Today's Date: 08/17/2015   History of Present Illness  Henry Neal is a 81 y.o. male with a past medical history significant for CAD s/p PCI to RCA and LAD two weeks ago, diffuse PVD, and HTN who presents with chest pain.  Clinical Impression   Pt is at or close to baseline functioning and should be safe at home. There are no further acute PT needs.  Hopefully pt will sign up for CRP@ when cleared by MD.  Will sign off at this time.      Follow Up Recommendations Other (comment) (needs CRP2 when cleared by MD)    Equipment Recommendations  Other (comment)    Recommendations for Other Services       Precautions / Restrictions Precautions Precautions: None      Mobility  Bed Mobility Overal bed mobility: Modified Independent                Transfers Overall transfer level: Modified independent                  Ambulation/Gait Ambulation/Gait assistance: Independent (in homelike environment) Ambulation Distance (Feet): 400 Feet Assistive device: None Gait Pattern/deviations: Step-through pattern;Decreased stride length;Trunk flexed Gait velocity: able to speed up appreciably to command, but prefers slow. Gait velocity interpretation: at or above normal speed for age/gender General Gait Details: slow, deliberate gait, steady, but stiff and decreased ability to scan/observe his environment  Stairs   Stairs assistance: Modified independent (Device/Increase time) Stair Management: One rail Right;Alternating pattern;Forwards Number of Stairs: 3 General stair comments: safe with rail  Wheelchair Mobility    Modified Rankin (Stroke Patients Only)       Balance Overall balance assessment: Needs assistance   Sitting balance-Leahy Scale: Good     Standing balance support: No upper extremity supported Standing balance-Leahy Scale: Good                                Pertinent Vitals/Pain Pain Assessment: 0-10 Pain Score: 5  Pain Location: chest Pain Descriptors / Indicators: Aching Pain Intervention(s): Monitored during session    Home Living Family/patient expects to be discharged to:: Private residence Living Arrangements: Alone Available Help at Discharge: Family;Available PRN/intermittently Type of Home: House Home Access: Stairs to enter Entrance Stairs-Rails: Psychiatric nurse of Steps: 3 Home Layout: One level Home Equipment: None      Prior Function Level of Independence: Independent         Comments: driving     Hand Dominance        Extremity/Trunk Assessment   Upper Extremity Assessment: Overall WFL for tasks assessed           Lower Extremity Assessment: Overall WFL for tasks assessed         Communication   Communication: No difficulties  Cognition Arousal/Alertness: Awake/alert Behavior During Therapy: WFL for tasks assessed/performed Overall Cognitive Status: Within Functional Limits for tasks assessed                      General Comments General comments (skin integrity, edema, etc.): sats on RA b/w 92--96%, EHR b/w 92-97 bpm.  No SOB, pt reports chest pain sternally at 5/10    Exercises        Assessment/Plan    PT Assessment Patent does not need any further PT services  PT Diagnosis  PT Problem List    PT Treatment Interventions     PT Goals (Current goals can be found in the Care Plan section)      Frequency     Barriers to discharge        Co-evaluation               End of Session Equipment Utilized During Treatment: Gait belt Activity Tolerance: Patient tolerated treatment well Patient left: in bed;with call bell/phone within reach;with nursing/sitter in room Nurse Communication: Mobility status    Functional Assessment Tool Used: clinical judgement Functional Limitation: Mobility: Walking and moving  around Mobility: Walking and Moving Around Current Status [phone]): 0 percent impaired, limited or restricted Mobility: Walking and Moving Around Goal Status [phone]): 0 percent impaired, limited or restricted Mobility: Walking and Moving Around Discharge Status [phone]): 0 percent impaired, limited or restricted    Time: 1031-1100 PT Time Calculation (min) (ACUTE ONLY): 29 min   Charges:   PT Evaluation $Initial PT Evaluation Tier I: 1 Procedure PT Treatments $Gait Training: 8-22 mins   PT G Codes:   PT G-Codes **NOT FOR INPATIENT CLASS** Functional Assessment Tool Used: clinical judgement Functional Limitation: Mobility: Walking and moving around Mobility: Walking and Moving Around Current Status (W7371): 0 percent impaired, limited or restricted Mobility: Walking and Moving Around Goal Status (G6269): 0 percent impaired, limited or restricted Mobility: Walking and Moving Around Discharge Status [phone]): 0 percent impaired, limited or restricted    Yomaris Palecek, Tessie Fass 08/17/2015, 11:21 AM

## 2015-08-17 NOTE — Discharge Instructions (Signed)
Follow with Primary MD RAMACHANDRAN,AJITH, MD in 7 days  ° °Get CBC, CMP, 2 view Chest X ray checked  by Primary MD next visit.  ° ° °Activity: As tolerated with Full fall precautions use walker/cane & assistance as needed ° ° °Disposition Home  ° ° °Diet: Heart Healthy , with feeding assistance and aspiration precautions. ° °For Heart failure patients - Check your Weight same time everyday, if you gain over 2 pounds, or you develop in leg swelling, experience more shortness of breath or chest pain, call your Primary MD immediately. Follow Cardiac Low Salt Diet and 1.5 lit/day fluid restriction. ° ° °On your next visit with your primary care physician please Get Medicines reviewed and adjusted. ° ° °Please request your Prim.MD to go over all Hospital Tests and Procedure/Radiological results at the follow up, please get all Hospital records sent to your Prim MD by signing hospital release before you go home. ° ° °If you experience worsening of your admission symptoms, develop shortness of breath, life threatening emergency, suicidal or homicidal thoughts you must seek medical attention immediately by calling 911 or calling your MD immediately  if symptoms less severe. ° °You Must read complete instructions/literature along with all the possible adverse reactions/side effects for all the Medicines you take and that have been prescribed to you. Take any new Medicines after you have completely understood and accpet all the possible adverse reactions/side effects.  ° °Do not drive, operating heavy machinery, perform activities at heights, swimming or participation in water activities or provide baby sitting services if your were admitted for syncope or siezures until you have seen by Primary MD or a Neurologist and advised to do so again. ° °Do not drive when taking Pain medications.  ° ° °Do not take more than prescribed Pain, Sleep and Anxiety Medications ° °Special Instructions: If you have smoked or chewed Tobacco   in the last 2 yrs please stop smoking, stop any regular Alcohol  and or any Recreational drug use. ° °Wear Seat belts while driving. ° ° °Please note ° °You were cared for by a hospitalist during your hospital stay. If you have any questions about your discharge medications or the care you received while you were in the hospital after you are discharged, you can call the unit and asked to speak with the hospitalist on call if the hospitalist that took care of you is not available. Once you are discharged, your primary care physician will handle any further medical issues. Please note that NO REFILLS for any discharge medications will be authorized once you are discharged, as it is imperative that you return to your primary care physician (or establish a relationship with a primary care physician if you do not have one) for your aftercare needs so that they can reassess your need for medications and monitor your lab values. ° °

## 2015-08-17 NOTE — Progress Notes (Signed)
Pt discharged home with son.  Discharge education completed with Pt who indicates understanding, pt can identify new medications he is taking from most recent admission.  Telemetry box removed and CCMD notified.  IV discontinued with catheter intact.  Pt denies any chest pain or SOB.  No S/S of distress.
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
## 2015-08-18 ENCOUNTER — Encounter (HOSPITAL_COMMUNITY): Payer: Self-pay

## 2015-08-19 ENCOUNTER — Encounter (HOSPITAL_COMMUNITY): Payer: Self-pay

## 2015-08-23 ENCOUNTER — Encounter (HOSPITAL_COMMUNITY): Payer: Self-pay

## 2015-08-24 ENCOUNTER — Ambulatory Visit (HOSPITAL_COMMUNITY)
Admit: 2015-08-24 | Discharge: 2015-08-24 | Disposition: A | Discharge status: Home / Self Care | Payer: Medicare Other | Source: Ambulatory Visit | Attending: Cardiology | Admitting: Cardiology

## 2015-08-24 ENCOUNTER — Encounter (HOSPITAL_COMMUNITY): Payer: Self-pay

## 2015-08-24 VITALS — BP 106/58 | HR 62 | Wt 185.8 lb

## 2015-08-24 DIAGNOSIS — I2510 Atherosclerotic heart disease of native coronary artery without angina pectoris: Secondary | ICD-10-CM | POA: Insufficient documentation

## 2015-08-24 DIAGNOSIS — I252 Old myocardial infarction: Secondary | ICD-10-CM | POA: Diagnosis not present

## 2015-08-24 DIAGNOSIS — Z7902 Long term (current) use of antithrombotics/antiplatelets: Secondary | ICD-10-CM | POA: Insufficient documentation

## 2015-08-24 DIAGNOSIS — Z87891 Personal history of nicotine dependence: Secondary | ICD-10-CM | POA: Insufficient documentation

## 2015-08-24 DIAGNOSIS — M79606 Pain in leg, unspecified: Secondary | ICD-10-CM | POA: Insufficient documentation

## 2015-08-24 DIAGNOSIS — Z955 Presence of coronary angioplasty implant and graft: Secondary | ICD-10-CM | POA: Insufficient documentation

## 2015-08-24 DIAGNOSIS — I6529 Occlusion and stenosis of unspecified carotid artery: Secondary | ICD-10-CM | POA: Insufficient documentation

## 2015-08-24 DIAGNOSIS — N189 Chronic kidney disease, unspecified: Secondary | ICD-10-CM | POA: Insufficient documentation

## 2015-08-24 DIAGNOSIS — I214 Non-ST elevation (NSTEMI) myocardial infarction: Secondary | ICD-10-CM | POA: Diagnosis not present

## 2015-08-24 DIAGNOSIS — I5022 Chronic systolic (congestive) heart failure: Secondary | ICD-10-CM | POA: Diagnosis not present

## 2015-08-24 DIAGNOSIS — I255 Ischemic cardiomyopathy: Secondary | ICD-10-CM | POA: Diagnosis not present

## 2015-08-24 DIAGNOSIS — Z8249 Family history of ischemic heart disease and other diseases of the circulatory system: Secondary | ICD-10-CM | POA: Insufficient documentation

## 2015-08-24 DIAGNOSIS — Z7982 Long term (current) use of aspirin: Secondary | ICD-10-CM | POA: Diagnosis not present

## 2015-08-24 DIAGNOSIS — Z79899 Other long term (current) drug therapy: Secondary | ICD-10-CM | POA: Diagnosis not present

## 2015-08-24 MED ORDER — CARVEDILOL 6.25 MG PO TABS: 6.2500 mg | ORAL_TABLET | Freq: Two times a day (BID) | ORAL | Status: DC

## 2015-08-24 NOTE — Patient Instructions (Signed)
Medications:  Increase Carvedilol 6.25 mg twice a day  Referral to cardiac rehab  Follow up:  1 month
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
## 2015-08-25 ENCOUNTER — Encounter (HOSPITAL_COMMUNITY): Payer: Self-pay

## 2015-08-26 ENCOUNTER — Encounter (HOSPITAL_COMMUNITY): Payer: Self-pay

## 2015-08-30 ENCOUNTER — Encounter (HOSPITAL_COMMUNITY): Payer: Self-pay

## 2015-09-01 ENCOUNTER — Encounter (HOSPITAL_COMMUNITY): Payer: Self-pay

## 2015-09-02 ENCOUNTER — Encounter (HOSPITAL_COMMUNITY): Payer: Self-pay

## 2015-09-04 ENCOUNTER — Other Ambulatory Visit (HOSPITAL_COMMUNITY): Payer: Self-pay | Admitting: Student

## 2015-09-06 ENCOUNTER — Encounter (HOSPITAL_COMMUNITY): Payer: Self-pay

## 2015-09-08 ENCOUNTER — Encounter (HOSPITAL_COMMUNITY): Payer: Self-pay

## 2015-09-09 ENCOUNTER — Encounter (HOSPITAL_COMMUNITY): Payer: Self-pay

## 2015-09-12 ENCOUNTER — Telehealth (HOSPITAL_COMMUNITY): Payer: Self-pay

## 2015-09-12 NOTE — Telephone Encounter (Signed)
Have him stay off both lisinopril and spironolactone.  Please get copy of labs from PCP asap.  Needs followup in office here soon.  Needs BMET here on Wednesday morning.

## 2015-09-12 NOTE — Telephone Encounter (Signed)
Returned call to patient Reports a 3-4 lb weight gain since yesterday Takes Spirolactone 25 mg daily  Reports that PCP said that his potasium was 6.4 Friday and 5.4 today  PCP Dr Betti Cruz . Told him to stop both the Spirolactone, lisinopril for 2 days, then start back on Lisinopril and decrease Spirolactone to twice a week  Please advise

## 2015-09-13 ENCOUNTER — Encounter (HOSPITAL_COMMUNITY): Payer: Self-pay

## 2015-09-13 NOTE — Telephone Encounter (Signed)
Called patient. He will be in for lab draw 11/23 (BMET) Instructed to stop both the Spirolactone and lisinopril  OV for 12/2 Patient repeated instructions, understands without questions Asked patient to call PCP office and have them fax results of labs to [phone]

## 2015-09-14 ENCOUNTER — Ambulatory Visit (HOSPITAL_COMMUNITY)
Admission: RE | Admit: 2015-09-14 | Discharge: 2015-09-14 | Disposition: A | Discharge status: Home / Self Care | Payer: Medicare Other | Source: Ambulatory Visit | Attending: Internal Medicine | Admitting: Internal Medicine

## 2015-09-14 DIAGNOSIS — I5022 Chronic systolic (congestive) heart failure: Secondary | ICD-10-CM | POA: Diagnosis present

## 2015-09-14 LAB — BASIC METABOLIC PANEL
Anion gap: 8 (ref 5–15)
BUN: 31 mg/dL — AB (ref 6–20)
CHLORIDE: 108 mmol/L (ref 101–111)
CO2: 22 mmol/L (ref 22–32)
CREATININE: 1.51 mg/dL — AB (ref 0.61–1.24)
Calcium: 9.4 mg/dL (ref 8.9–10.3)
GFR calc Af Amer: 46 mL/min — ABNORMAL LOW (ref >60)
GFR, EST NON AFRICAN AMERICAN: 40 mL/min — AB (ref >60)
Glucose, Bld: 108 mg/dL — ABNORMAL HIGH (ref 65–99)
Potassium: 5.9 mmol/L — ABNORMAL HIGH (ref 3.5–5.1)
SODIUM: 138 mmol/L (ref 135–145)

## 2015-09-20 ENCOUNTER — Encounter (HOSPITAL_COMMUNITY): Payer: Self-pay

## 2015-09-20 ENCOUNTER — Ambulatory Visit (HOSPITAL_COMMUNITY)
Admission: RE | Admit: 2015-09-20 | Discharge: 2015-09-20 | Disposition: A | Discharge status: Home / Self Care | Payer: Medicare Other | Source: Ambulatory Visit | Attending: Cardiology | Admitting: Cardiology

## 2015-09-20 VITALS — BP 104/56 | HR 75 | Wt 176.5 lb

## 2015-09-20 DIAGNOSIS — I252 Old myocardial infarction: Secondary | ICD-10-CM | POA: Insufficient documentation

## 2015-09-20 DIAGNOSIS — Z7982 Long term (current) use of aspirin: Secondary | ICD-10-CM | POA: Insufficient documentation

## 2015-09-20 DIAGNOSIS — I2582 Chronic total occlusion of coronary artery: Secondary | ICD-10-CM | POA: Insufficient documentation

## 2015-09-20 DIAGNOSIS — Z79899 Other long term (current) drug therapy: Secondary | ICD-10-CM | POA: Diagnosis not present

## 2015-09-20 DIAGNOSIS — Z87891 Personal history of nicotine dependence: Secondary | ICD-10-CM | POA: Diagnosis not present

## 2015-09-20 DIAGNOSIS — I7300 Raynaud's syndrome without gangrene: Secondary | ICD-10-CM | POA: Insufficient documentation

## 2015-09-20 DIAGNOSIS — N179 Acute kidney failure, unspecified: Secondary | ICD-10-CM | POA: Diagnosis not present

## 2015-09-20 DIAGNOSIS — I6529 Occlusion and stenosis of unspecified carotid artery: Secondary | ICD-10-CM | POA: Insufficient documentation

## 2015-09-20 DIAGNOSIS — N289 Disorder of kidney and ureter, unspecified: Secondary | ICD-10-CM

## 2015-09-20 DIAGNOSIS — I2510 Atherosclerotic heart disease of native coronary artery without angina pectoris: Secondary | ICD-10-CM | POA: Insufficient documentation

## 2015-09-20 DIAGNOSIS — I447 Left bundle-branch block, unspecified: Secondary | ICD-10-CM | POA: Diagnosis not present

## 2015-09-20 DIAGNOSIS — I5022 Chronic systolic (congestive) heart failure: Secondary | ICD-10-CM | POA: Insufficient documentation

## 2015-09-20 DIAGNOSIS — Z8249 Family history of ischemic heart disease and other diseases of the circulatory system: Secondary | ICD-10-CM | POA: Insufficient documentation

## 2015-09-20 DIAGNOSIS — N189 Chronic kidney disease, unspecified: Secondary | ICD-10-CM | POA: Diagnosis not present

## 2015-09-20 DIAGNOSIS — I714 Abdominal aortic aneurysm, without rupture: Secondary | ICD-10-CM | POA: Insufficient documentation

## 2015-09-20 LAB — BASIC METABOLIC PANEL
ANION GAP: 7 (ref 5–15)
BUN: 20 mg/dL (ref 6–20)
CALCIUM: 9.1 mg/dL (ref 8.9–10.3)
CO2: 23 mmol/L (ref 22–32)
Chloride: 107 mmol/L (ref 101–111)
Creatinine, Ser: 1.01 mg/dL (ref 0.61–1.24)
Glucose, Bld: 108 mg/dL — ABNORMAL HIGH (ref 65–99)
Potassium: 3.7 mmol/L (ref 3.5–5.1)
SODIUM: 137 mmol/L (ref 135–145)

## 2015-09-20 LAB — BRAIN NATRIURETIC PEPTIDE: B NATRIURETIC PEPTIDE 5: 979.1 pg/mL — AB (ref 0.0–100.0)

## 2015-09-20 NOTE — Progress Notes (Signed)
Patient ID: Henry Neal, male   DOB: 08-28-1927, 81 y.o.   MRN: [id number] PCP: Dr. Ashby Dawes Cardiology: Dr. Aundra Dubin  81 yo with history of CAD, chronic systolic CHF, LBBB, AAA s/p repair presents for cardiology followup.  He had a cardiac cath in 12/15, showing total occlusion of the LAD and 50-60% proximal RCA.  EF at that time was near-normal.  In 10/16, he developed dyspnea and epigastric pain.  He was noted to be in acute pulmonary edema and was diuresed.  Troponin was only mildly elevated at 0.47.  He ultimately underwent LHC showing hazy 80-90% RCA stenosis which was likely the culprit for his presentation.  Additionally, the LAD was only subtotally occluded on this cath.  He had DES to RCA.  Cardiac MRI was done, showing EF 22% with significant viability in LAD territory.  Therefore, CTO of LAD was opened with DES.    Since last appointment, creatinine and K rose significantly.  Lisinopril and spironolactone were both stopped.  He has been doing well otherwise . No chest pain.  No exertional dyspnea.  He is back at work full time.  He continues to have bilateral leg pain with ambulation.  ABIs were normal, possibly this is sciatica.  With rise in creatinine, he stopped taking diclofenac.  Had been taking daily for aches/pains.   ECG: NSR, LBBB with QRS 172 msec  Labs (10/16): hgb 9.7, K 4.6, creatinine 1.12 Labs (11/16): K 5.9, creatinine 1.5  PMH: 1. CKD 2. AAA s/p repair, followed at [facility]. 3. Carotid stenosis: s/p right CEA.   Carotid dopplers (2/16) with right CEA stable, < [id number] LICA stenosis.  Followed at [facility].  4. Chronic LBBB 5. LAD: LHC (12/15) with totally occluded LAD, 50-60% pRCA => medically managed.  10/16 admitted with NSTEMI.  LHC with subtotalled LAD occlusion, 80-90% pRCA with thrombus => DES RCA initially.  Cardiac MRI was done showing viability in the LAD territory, so he had DES to the CTO LAD.   6. Chronic systolic CHF: Ischemic cardiomyopathy.  Echo (10/16) with EF  15% with wall motion abnormalities, moderately dilated RV with normal systolic function.  CMRI (10/16) with severe LV dilation, EF 22% with septal-lateral dyssynchrony, no LV thrombus, normal RV size/systolic function => significant viability noted in the LAD territory.   7. ABIs (10/16) were normal.  8. Raynaud's syndrome.   SH: Lives alone, prior smoker, still working as a Furniture conservator/restorer.  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   Current Outpatient Prescriptions  Medication Sig Dispense Refill  . aspirin EC 81 MG tablet Take 81 mg by mouth daily.      Marland Kitchen atorvastatin (LIPITOR) 40 MG tablet Take 1 tablet (40 mg total) by mouth daily at 6 PM. 30 tablet 6  . Calcium Carbonate-Vitamin D (CALCIUM 600 + D PO) Take 1 tablet by mouth 2 (two) times daily.     . carvedilol (COREG) 6.25 MG tablet Take 1 tablet (6.25 mg total) by mouth 2 (two) times daily. 180 tablet 3  . clopidogrel (PLAVIX) 75 MG tablet take 1 tablet by mouth once daily with BREAKFAST 90 tablet 0  . finasteride (PROSCAR) 5 MG tablet take 1 tablet by mouth once daily    . fluticasone (FLONASE) 50 MCG/ACT nasal spray Place 1 spray into both nostrils daily as needed for allergies.     Marland Kitchen gabapentin (NEURONTIN) 300 MG capsule Take 300 mg by mouth 2 (two) times daily.  0  .  isosorbide mononitrate (IMDUR) 30 MG 24 hr tablet Take 1 tablet (30 mg total) by mouth daily. 30 tablet 6  . ketoconazole (NIZORAL) 2 % cream apply to affected area ON FACE ONCE DAILY prn  0  . meclizine (ANTIVERT) 25 MG tablet Take 25 mg by mouth 2 (two) times daily as needed for dizziness.     . mometasone (ELOCON) 0.1 % cream Apply 1 application topically daily as needed (for irritation).     . Multiple Vitamin (MULTIVITAMIN PO) Take 1 tablet by mouth daily. ALIVE men's vitamin    . pantoprazole (PROTONIX) 40 MG tablet Take 40 mg by mouth daily.  0  . NITROSTAT 0.4 MG SL tablet place 1 tablet under the tongue if needed every 5 minutes for  chest pain CALL 911 AFTER 3 DOSES (Patient not taking: Reported on 08/24/2015) 25 tablet 10   No current facility-administered medications for this encounter.   BP 104/56 mmHg  Pulse 75  Wt 176 lb 8 oz (80.06 kg)  SpO2 97% General: NAD Neck: No JVD, no thyromegaly or thyroid nodule.  Lungs: Clear to auscultation bilaterally with normal respiratory effort. CV: Nondisplaced PMI.  Heart regular S1/S2, no S3/S4, no murmur.  Trace ankle edema.  No carotid bruit.  Normal pedal pulses.  Abdomen: Soft, nontender, no hepatosplenomegaly, no distention.  Skin: Intact without lesions or rashes.  Neurologic: Alert and oriented x 3.  Psych: Normal affect. Extremities: No clubbing or cyanosis.  HEENT: Normal.   Assessment/Plan: 1. CAD: Recent NSTEMI with DES to RCA (culprit vessel) and subsequent DES to CTO of LAD (cMRI showed viability in the LAD distribution.  No exertional chest pain.  - Continue ASA 81 and Plavix. - Continue statin.  - I will have him start cardiac rehab.  2. Chronic systolic CHF: Ischemic cardiomyopathy.  NYHA class II symptoms.  He is not volume overloaded on exam.  We had to stop lisinopril and spironolactone with rise in creatinine and hyperkalemia. - Continue Coreg 6.25 mg bid. - BMET today => if K is back in normal range, will put him back on lisinopril at 2.5 mg daily along with Veltassa to control K.     - Repeat echo in 2/16.  If EF remains < 35%, will need to consider CRT-P with very wide LBBB (would avoid ICD given age).   3. Leg pain: Recent ABIs normal.  This may be sciatica. 4. Carotid stenosis: Followed at [facility].  5. CKD/AKI: Stop diclofenac.  Will repeat BMET today, and as above, if creatinine is back to normal and K is back to normal, will try to treat with lisinopril and Veltassa.   Followup in 3 wks.    Loralie Champagne 09/20/2015

## 2015-09-20 NOTE — Progress Notes (Signed)
Advanced Heart Failure Medication Review by a Pharmacist  Does the patient  feel that his/her medications are working for him/her?  yes  Has the patient been experiencing any side effects to the medications prescribed?  no  Does the patient measure his/her own blood pressure or blood glucose at home?  no   Does the patient have any problems obtaining medications due to transportation or finances?   no  Understanding of regimen: good Understanding of indications: good Potential of compliance: good Patient understands to avoid NSAIDs. Patient understands to avoid decongestants.  Issues to address at subsequent visits: None   Pharmacist comments:  Mr. Dupuy is a pleasant 81 yo M presenting without a medication list but with good recall of his regimen including dosages. He reports excellent compliance with his medications and did not have any specific medication-related questions or concerns for me at this time.   Ruta Hinds. Velva Harman, PharmD, BCPS, CPP Clinical Pharmacist Pager: [phone] Phone: [phone] 09/20/2015 10:30 AM      Time with patient: 10 minutes Preparation and documentation time: 2 minutes Total time: 12 minutes

## 2015-09-20 NOTE — Patient Instructions (Signed)
Labs today  You have been referred to Cardiac Rehab, they will call you to scheduled  Your physician recommends that you schedule a follow-up appointment in: 3 weeks

## 2015-09-23 ENCOUNTER — Encounter (HOSPITAL_COMMUNITY): Payer: Medicare Other

## 2015-09-23 ENCOUNTER — Telehealth (HOSPITAL_COMMUNITY): Payer: Self-pay

## 2015-09-23 MED ORDER — PATIROMER SORBITEX CALCIUM 8.4 G PO PACK: 8.4000 g | PACK | Freq: Every day | ORAL | Status: DC

## 2015-09-23 MED ORDER — LISINOPRIL 2.5 MG PO TABS: 2.5000 mg | ORAL_TABLET | Freq: Every day | ORAL | Status: DC

## 2015-09-23 NOTE — Telephone Encounter (Signed)
Patient called for lab results Results to patient along with instructions from Dr. Aundra Dubin. Lisinopril 2.5 mg daily along with Veltassa 8.4 g daily sent to [facility] Patient will come in Wednesday 12/7 for BMET  Patient repeated instructions correctly

## 2015-10-03 ENCOUNTER — Ambulatory Visit (HOSPITAL_COMMUNITY)
Admission: RE | Admit: 2015-10-03 | Discharge: 2015-10-03 | Disposition: A | Discharge status: Home / Self Care | Payer: Medicare Other | Source: Ambulatory Visit | Attending: Cardiology | Admitting: Cardiology

## 2015-10-03 DIAGNOSIS — I5022 Chronic systolic (congestive) heart failure: Secondary | ICD-10-CM | POA: Diagnosis present

## 2015-10-03 LAB — BASIC METABOLIC PANEL
Anion gap: 6 (ref 5–15)
BUN: 17 mg/dL (ref 6–20)
CALCIUM: 8.8 mg/dL — AB (ref 8.9–10.3)
CO2: 26 mmol/L (ref 22–32)
CREATININE: 1.07 mg/dL (ref 0.61–1.24)
Chloride: 105 mmol/L (ref 101–111)
GFR calc Af Amer: >60 mL/min (ref >60)
Glucose, Bld: 107 mg/dL — ABNORMAL HIGH (ref 65–99)
POTASSIUM: 4.2 mmol/L (ref 3.5–5.1)
SODIUM: 137 mmol/L (ref 135–145)

## 2015-10-04 ENCOUNTER — Other Ambulatory Visit (HOSPITAL_COMMUNITY): Payer: Self-pay | Admitting: Cardiology

## 2015-10-06 ENCOUNTER — Encounter (HOSPITAL_COMMUNITY)
Admission: RE | Admit: 2015-10-06 | Discharge: 2015-10-06 | Disposition: A | Discharge status: Home / Self Care | Payer: Medicare Other | Source: Ambulatory Visit | Attending: Cardiology | Admitting: Cardiology

## 2015-10-06 ENCOUNTER — Inpatient Hospital Stay (HOSPITAL_COMMUNITY): Admission: RE | Admit: 2015-10-06 | Payer: Medicare Other | Source: Ambulatory Visit

## 2015-10-06 NOTE — Progress Notes (Addendum)
Cardiac Rehab Medication Review by a Pharmacist  Does the patient  feel that his/her medications are working for him/her?  yes  Has the patient been experiencing any side effects to the medications prescribed?  no  Does the patient measure his/her own blood pressure or blood glucose at home?  no   Does the patient have any problems obtaining medications due to transportation or finances?   no  Understanding of regimen: good Understanding of indications: good Potential of compliance: good    Pharmacist comments: 81 y/o M presents in good spirits and no distress. Reviewed patients medications, no changes noted. Patient endorses compliance to regimen and tolerating well. Pt has prescription for patiromer but has not started taking and is suppose to follow up with PCP. Recent potassium levels seem ok. Deferred to PCP with regards to treatment plan with patiromer.   Angela Burke, PharmD Pharmacy Resident Pager: [phone] 10/06/2015 8:50 AM

## 2015-10-10 ENCOUNTER — Encounter (HOSPITAL_COMMUNITY): Payer: Medicare Other

## 2015-10-11 ENCOUNTER — Ambulatory Visit (HOSPITAL_COMMUNITY)
Admission: RE | Admit: 2015-10-11 | Discharge: 2015-10-11 | Disposition: A | Discharge status: Home / Self Care | Payer: Medicare Other | Source: Ambulatory Visit | Attending: Cardiology | Admitting: Cardiology

## 2015-10-11 VITALS — BP 125/58 | HR 68 | Ht 70.0 in | Wt 177.0 lb

## 2015-10-11 DIAGNOSIS — I2510 Atherosclerotic heart disease of native coronary artery without angina pectoris: Secondary | ICD-10-CM | POA: Insufficient documentation

## 2015-10-11 DIAGNOSIS — N189 Chronic kidney disease, unspecified: Secondary | ICD-10-CM | POA: Insufficient documentation

## 2015-10-11 DIAGNOSIS — M79606 Pain in leg, unspecified: Secondary | ICD-10-CM | POA: Diagnosis not present

## 2015-10-11 DIAGNOSIS — I5022 Chronic systolic (congestive) heart failure: Secondary | ICD-10-CM | POA: Insufficient documentation

## 2015-10-11 DIAGNOSIS — I6529 Occlusion and stenosis of unspecified carotid artery: Secondary | ICD-10-CM | POA: Diagnosis not present

## 2015-10-11 MED ORDER — LISINOPRIL 5 MG PO TABS: 5.0000 mg | ORAL_TABLET | Freq: Every day | ORAL | Status: DC

## 2015-10-11 NOTE — Patient Instructions (Signed)
Increase Lisinopril to 5 mg daily  Labs in 10 days  Your physician has requested that you have an echocardiogram. Echocardiography is a painless test that uses sound waves to create images of your heart. It provides your doctor with information about the size and shape of your heart and how well your heart's chambers and valves are working. This procedure takes approximately one hour. There are no restrictions for this procedure.  IN [place]   Your physician recommends that you schedule a follow-up appointment in: 1 month

## 2015-10-11 NOTE — Progress Notes (Signed)
Patient ID: RAHSAAN HIPPLER, male   DOB: 1927/10/10, 81 y.o.   MRN: [id number] PCP: Dr. Ashby Dawes Cardiology: Dr. Aundra Dubin  81 yo with history of CAD, chronic systolic CHF, LBBB, AAA s/p repair presents for cardiology followup.  He had a cardiac cath in 12/15, showing total occlusion of the LAD and 50-60% proximal RCA.  EF at that time was near-normal.  In 10/16, he developed dyspnea and epigastric pain.  He was noted to be in acute pulmonary edema and was diuresed.  Troponin was only mildly elevated at 0.47.  He ultimately underwent LHC showing hazy 80-90% RCA stenosis which was likely the culprit for his presentation.  Additionally, the LAD was only subtotally occluded on this cath.  He had DES to RCA.  Cardiac MRI was done, showing EF 22% with significant viability in LAD territory.  Therefore, CTO of LAD was opened with DES.    Mr Wierman has tolerated lisinopril so far, no rise in K or creatinine as he had in the past.  No chest pain.  No exertional dyspnea.  He is back at work full time.  He continues to have bilateral leg pain with ambulation.  ABIs were normal, possibly this is sciatica.  With rise in creatinine and K, he stopped taking diclofenac.  Occasional palpitations.  No orthopnea/PND.    ECG: NSR, LBBB with QRS 176 msec  Labs (10/16): hgb 9.7, K 4.6, creatinine 1.12 Labs (11/16): K 5.9, creatinine 1.5 Labs (12/16); K 4.2, creatinine 1.07  PMH: 1. CKD 2. AAA s/p repair, followed at [facility]. 3. Carotid stenosis: s/p right CEA.   Carotid dopplers (2/16) with right CEA stable, < [id number] LICA stenosis.  Followed at [facility].  4. Chronic LBBB 5. LAD: LHC (12/15) with totally occluded LAD, 50-60% pRCA => medically managed.  10/16 admitted with NSTEMI.  LHC with subtotalled LAD occlusion, 80-90% pRCA with thrombus => DES RCA initially.  Cardiac MRI was done showing viability in the LAD territory, so he had DES to the CTO LAD.   6. Chronic systolic CHF: Ischemic cardiomyopathy.  Echo (10/16) with EF 15%  with wall motion abnormalities, moderately dilated RV with normal systolic function.  CMRI (10/16) with severe LV dilation, EF 22% with septal-lateral dyssynchrony, no LV thrombus, normal RV size/systolic function => significant viability noted in the LAD territory.   7. ABIs (10/16) were normal.  8. Raynaud's syndrome.   SH: Lives alone, prior smoker, still working as a Furniture conservator/restorer.  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   Current Outpatient Prescriptions  Medication Sig Dispense Refill  . aspirin EC 81 MG tablet Take 81 mg by mouth daily.      Marland Kitchen atorvastatin (LIPITOR) 40 MG tablet Take 1 tablet (40 mg total) by mouth daily at 6 PM. 30 tablet 6  . Calcium Carbonate-Vitamin D (CALCIUM 600 + D PO) Take 1 tablet by mouth 2 (two) times daily.     . carvedilol (COREG) 6.25 MG tablet Take 1 tablet (6.25 mg total) by mouth 2 (two) times daily. 180 tablet 3  . clopidogrel (PLAVIX) 75 MG tablet take 1 tablet by mouth once daily with BREAKFAST 90 tablet 0  . finasteride (PROSCAR) 5 MG tablet take 1 tablet by mouth once daily    . fluticasone (FLONASE) 50 MCG/ACT nasal spray Place 1 spray into both nostrils daily as needed for allergies.     Marland Kitchen gabapentin (NEURONTIN) 300 MG capsule Take 300 mg by mouth 2 (two)  times daily.  0  . isosorbide mononitrate (IMDUR) 30 MG 24 hr tablet Take 1 tablet (30 mg total) by mouth daily. 30 tablet 6  . ketoconazole (NIZORAL) 2 % cream apply to affected area ON FACE ONCE DAILY prn  0  . lisinopril (PRINIVIL,ZESTRIL) 5 MG tablet Take 1 tablet (5 mg total) by mouth daily. 30 tablet 3  . meclizine (ANTIVERT) 25 MG tablet Take 25 mg by mouth 2 (two) times daily as needed for dizziness.     . mometasone (ELOCON) 0.1 % cream Apply 1 application topically daily as needed (for irritation).     . Multiple Vitamin (MULTIVITAMIN PO) Take 1 tablet by mouth daily. ALIVE men's vitamin    . pantoprazole (PROTONIX) 40 MG tablet Take 40 mg by mouth daily.  0   . NITROSTAT 0.4 MG SL tablet place 1 tablet under the tongue if needed every 5 minutes for chest pain CALL 911 AFTER 3 DOSES (Patient not taking: Reported on 10/11/2015) 25 tablet 10   No current facility-administered medications for this encounter.   BP 125/58 mmHg  Pulse 68  Ht 5\' 10"  (1.778 m)  Wt 177 lb (80.287 kg)  BMI 25.40 kg/m2  SpO2 100% General: NAD Neck: No JVD, no thyromegaly or thyroid nodule.  Lungs: Clear to auscultation bilaterally with normal respiratory effort. CV: Nondisplaced PMI.  Heart regular S1/S2, no S3/S4, no murmur.  1+ ankle edema.  No carotid bruit.  Normal pedal pulses.  Abdomen: Soft, nontender, no hepatosplenomegaly, no distention.  Skin: Intact without lesions or rashes.  Neurologic: Alert and oriented x 3.  Psych: Normal affect. Extremities: No clubbing or cyanosis.  HEENT: Normal.   Assessment/Plan: 1. CAD: Recent NSTEMI with DES to RCA (culprit vessel) and subsequent DES to CTO of LAD (cMRI showed viability in the LAD distribution.  No exertional chest pain.  - Continue ASA 81 and Plavix. - Continue statin.  - He will be starting cardiac rehab.  2. Chronic systolic CHF: Ischemic cardiomyopathy.  NYHA class II symptoms.  He is not volume overloaded on exam.  We had to stop lisinopril and spironolactone in the past with rise in creatinine and hyperkalemia.  However, he is tolerating low dose lisinopril now with normal K and creatinine.   - Continue Coreg 6.25 mg bid. - Increase lisinopril to 5 mg daily with BMET/BNP in 10 days.   - Repeat echo in 2/16.  If EF remains < 35%, will need to consider CRT-P with very wide LBBB (would avoid ICD given age).   3. Leg pain: Recent ABIs normal.  This may be sciatica. 4. Carotid stenosis: Followed at [facility].  5. CKD: Stopped diclofenac.  BMET back to normal range.   Followup in 1 month.    Loralie Champagne 10/11/2015

## 2015-10-12 ENCOUNTER — Encounter (HOSPITAL_COMMUNITY): Payer: Medicare Other

## 2015-10-14 ENCOUNTER — Encounter (HOSPITAL_COMMUNITY): Payer: Medicare Other

## 2015-10-19 ENCOUNTER — Encounter (HOSPITAL_COMMUNITY): Payer: Medicare Other

## 2015-10-21 ENCOUNTER — Encounter (HOSPITAL_COMMUNITY): Payer: Medicare Other

## 2015-10-21 ENCOUNTER — Ambulatory Visit (HOSPITAL_COMMUNITY)
Admission: RE | Admit: 2015-10-21 | Discharge: 2015-10-21 | Disposition: A | Discharge status: Home / Self Care | Payer: Medicare Other | Source: Ambulatory Visit | Attending: Cardiology | Admitting: Cardiology

## 2015-10-21 DIAGNOSIS — I5022 Chronic systolic (congestive) heart failure: Secondary | ICD-10-CM | POA: Diagnosis present

## 2015-10-21 LAB — BASIC METABOLIC PANEL
Anion gap: 8 (ref 5–15)
BUN: 19 mg/dL (ref 6–20)
CALCIUM: 9.2 mg/dL (ref 8.9–10.3)
CHLORIDE: 104 mmol/L (ref 101–111)
CO2: 26 mmol/L (ref 22–32)
CREATININE: 1.02 mg/dL (ref 0.61–1.24)
GFR calc non Af Amer: >60 mL/min (ref >60)
GLUCOSE: 116 mg/dL — AB (ref 65–99)
Potassium: 4.2 mmol/L (ref 3.5–5.1)
Sodium: 138 mmol/L (ref 135–145)

## 2015-10-21 LAB — BRAIN NATRIURETIC PEPTIDE: B NATRIURETIC PEPTIDE 5: 447.6 pg/mL — AB (ref 0.0–100.0)

## 2015-10-26 ENCOUNTER — Encounter (HOSPITAL_COMMUNITY): Payer: Self-pay

## 2015-10-26 ENCOUNTER — Encounter (HOSPITAL_COMMUNITY)
Admission: RE | Admit: 2015-10-26 | Discharge: 2015-10-26 | Disposition: A | Discharge status: Home / Self Care | Payer: Medicare Other | Source: Ambulatory Visit | Attending: Cardiology | Admitting: Cardiology

## 2015-10-26 DIAGNOSIS — Z955 Presence of coronary angioplasty implant and graft: Secondary | ICD-10-CM | POA: Diagnosis present

## 2015-10-26 DIAGNOSIS — I214 Non-ST elevation (NSTEMI) myocardial infarction: Secondary | ICD-10-CM | POA: Insufficient documentation

## 2015-10-26 NOTE — Progress Notes (Signed)
Pt started cardiac rehab today.  Pt tolerated light exercise without difficulty. VSS, telemetry-sinus rhythm, BBB, asymptomatic.  Medication list reconciled.  Pt verbalized compliance with medications and denies barriers to compliance. PSYCHOSOCIAL ASSESSMENT:  PHQ-0. Pt exhibits positive coping skills, hopeful outlook with supportive family. No psychosocial needs identified at this time, no psychosocial interventions necessary.    Pt enjoys working as Scientist, product/process development.   Pt cardiac rehab  goal is  to increase strength, stamina and endurance.  Pt encouraged to participate in home exercise in addition to cardiac rehab to increase ability to achieve these goals.   Pt oriented to exercise equipment and routine.  Understanding verbalized.

## 2015-10-28 ENCOUNTER — Encounter: Payer: Self-pay | Admitting: Podiatry

## 2015-10-28 ENCOUNTER — Encounter (HOSPITAL_COMMUNITY): Payer: Medicare Other

## 2015-10-28 ENCOUNTER — Ambulatory Visit (INDEPENDENT_AMBULATORY_CARE_PROVIDER_SITE_OTHER): Payer: Medicare Other | Admitting: Podiatry

## 2015-10-28 ENCOUNTER — Ambulatory Visit: Payer: Medicare Other | Admitting: Podiatry

## 2015-10-28 DIAGNOSIS — B351 Tinea unguium: Secondary | ICD-10-CM

## 2015-10-28 DIAGNOSIS — L84 Corns and callosities: Secondary | ICD-10-CM | POA: Diagnosis not present

## 2015-10-28 DIAGNOSIS — M79676 Pain in unspecified toe(s): Secondary | ICD-10-CM

## 2015-10-28 NOTE — Progress Notes (Signed)
Patient ID: Henry Neal, male   DOB: [birth year], 82 y.o.   MRN: [id number] Complaint:  Visit Type: Patient returns to my office for continued preventative foot care services. Complaint: Patient states" my nails have grown long and thick and become painful to walk and wear shoes"  He presents for preventative foot care services. No changes to ROS  Podiatric Exam: Vascular: dorsalis pedis and posterior tibial pulses are palpable bilateral. Capillary return is immediate. Temperature gradient is WNL. Skin turgor WNL  Sensorium: Normal Semmes Weinstein monofilament test. Normal tactile sensation bilaterally. Nail Exam: Pt has thick disfigured discolored nails with subungual debris noted bilateral entire nail hallux through fifth toenails Ulcer Exam: There is no evidence of ulcer or pre-ulcerative changes or infection. Orthopedic Exam: Muscle tone and strength are WNL. No limitations in general ROM. No crepitus or effusions noted. Foot type and digits show no abnormalities. Bony prominences are unremarkable. Skin: No Porokeratosis. No infection or ulcers. Distal Clavi second toe right.  Diagnosis:  Tinea unguium, Pain in right toe, pain in left toes, Second toe clavi right  Treatment & Plan Procedures and Treatment: Consent by patient was obtained for treatment procedures. The patient understood the discussion of treatment and procedures well. All questions were answered thoroughly reviewed. Debridement of mycotic and hypertrophic toenails, 1 through 5 bilateral and clearing of subungual debris. No ulceration, no infection noted. Debride clavi. Return Visit-Office Procedure: Patient instructed to return to the office for a follow up visit 3 months for continued evaluation and treatment.

## 2015-10-31 ENCOUNTER — Encounter (HOSPITAL_COMMUNITY): Payer: Medicare Other

## 2015-11-02 ENCOUNTER — Encounter (HOSPITAL_COMMUNITY)
Admission: RE | Admit: 2015-11-02 | Discharge: 2015-11-02 | Disposition: A | Discharge status: Home / Self Care | Payer: Medicare Other | Source: Ambulatory Visit | Attending: Cardiology | Admitting: Cardiology

## 2015-11-02 DIAGNOSIS — I214 Non-ST elevation (NSTEMI) myocardial infarction: Secondary | ICD-10-CM | POA: Diagnosis not present

## 2015-11-04 ENCOUNTER — Encounter (HOSPITAL_COMMUNITY)
Admission: RE | Admit: 2015-11-04 | Discharge: 2015-11-04 | Disposition: A | Discharge status: Home / Self Care | Payer: Medicare Other | Source: Ambulatory Visit | Attending: Cardiology | Admitting: Cardiology

## 2015-11-04 DIAGNOSIS — I214 Non-ST elevation (NSTEMI) myocardial infarction: Secondary | ICD-10-CM | POA: Diagnosis not present

## 2015-11-07 ENCOUNTER — Encounter (HOSPITAL_COMMUNITY)
Admission: RE | Admit: 2015-11-07 | Discharge: 2015-11-07 | Disposition: A | Discharge status: Home / Self Care | Payer: Medicare Other | Source: Ambulatory Visit | Attending: Cardiology | Admitting: Cardiology

## 2015-11-07 DIAGNOSIS — I214 Non-ST elevation (NSTEMI) myocardial infarction: Secondary | ICD-10-CM | POA: Diagnosis not present

## 2015-11-09 ENCOUNTER — Encounter (HOSPITAL_COMMUNITY)
Admission: RE | Admit: 2015-11-09 | Discharge: 2015-11-09 | Disposition: A | Discharge status: Home / Self Care | Payer: Medicare Other | Source: Ambulatory Visit | Attending: Cardiology | Admitting: Cardiology

## 2015-11-09 DIAGNOSIS — I214 Non-ST elevation (NSTEMI) myocardial infarction: Secondary | ICD-10-CM | POA: Diagnosis not present

## 2015-11-11 ENCOUNTER — Ambulatory Visit (HOSPITAL_COMMUNITY)
Admission: RE | Admit: 2015-11-11 | Discharge: 2015-11-11 | Disposition: A | Discharge status: Home / Self Care | Payer: Medicare Other | Source: Ambulatory Visit | Attending: Cardiology | Admitting: Cardiology

## 2015-11-11 ENCOUNTER — Encounter (HOSPITAL_COMMUNITY)
Admission: RE | Admit: 2015-11-11 | Discharge: 2015-11-11 | Disposition: A | Discharge status: Home / Self Care | Payer: Medicare Other | Source: Ambulatory Visit | Attending: Cardiology | Admitting: Cardiology

## 2015-11-11 VITALS — BP 100/50 | HR 67 | Wt 172.0 lb

## 2015-11-11 DIAGNOSIS — I6529 Occlusion and stenosis of unspecified carotid artery: Secondary | ICD-10-CM | POA: Diagnosis not present

## 2015-11-11 DIAGNOSIS — I252 Old myocardial infarction: Secondary | ICD-10-CM | POA: Diagnosis not present

## 2015-11-11 DIAGNOSIS — I2510 Atherosclerotic heart disease of native coronary artery without angina pectoris: Secondary | ICD-10-CM | POA: Insufficient documentation

## 2015-11-11 DIAGNOSIS — Z7902 Long term (current) use of antithrombotics/antiplatelets: Secondary | ICD-10-CM | POA: Diagnosis not present

## 2015-11-11 DIAGNOSIS — I255 Ischemic cardiomyopathy: Secondary | ICD-10-CM | POA: Diagnosis not present

## 2015-11-11 DIAGNOSIS — Z7982 Long term (current) use of aspirin: Secondary | ICD-10-CM | POA: Diagnosis not present

## 2015-11-11 DIAGNOSIS — I5022 Chronic systolic (congestive) heart failure: Secondary | ICD-10-CM | POA: Diagnosis present

## 2015-11-11 DIAGNOSIS — N189 Chronic kidney disease, unspecified: Secondary | ICD-10-CM | POA: Insufficient documentation

## 2015-11-11 DIAGNOSIS — Z87891 Personal history of nicotine dependence: Secondary | ICD-10-CM | POA: Insufficient documentation

## 2015-11-11 DIAGNOSIS — Z8249 Family history of ischemic heart disease and other diseases of the circulatory system: Secondary | ICD-10-CM | POA: Diagnosis not present

## 2015-11-11 DIAGNOSIS — M79606 Pain in leg, unspecified: Secondary | ICD-10-CM | POA: Insufficient documentation

## 2015-11-11 DIAGNOSIS — Z955 Presence of coronary angioplasty implant and graft: Secondary | ICD-10-CM | POA: Insufficient documentation

## 2015-11-11 DIAGNOSIS — Z79899 Other long term (current) drug therapy: Secondary | ICD-10-CM | POA: Diagnosis not present

## 2015-11-11 DIAGNOSIS — I214 Non-ST elevation (NSTEMI) myocardial infarction: Secondary | ICD-10-CM | POA: Diagnosis not present

## 2015-11-11 LAB — BASIC METABOLIC PANEL
ANION GAP: 9 (ref 5–15)
BUN: 28 mg/dL — AB (ref 6–20)
CO2: 25 mmol/L (ref 22–32)
Calcium: 9.3 mg/dL (ref 8.9–10.3)
Chloride: 103 mmol/L (ref 101–111)
Creatinine, Ser: 1.13 mg/dL (ref 0.61–1.24)
GFR calc Af Amer: >60 mL/min (ref >60)
GFR, EST NON AFRICAN AMERICAN: 57 mL/min — AB (ref >60)
Glucose, Bld: 98 mg/dL (ref 65–99)
POTASSIUM: 4.1 mmol/L (ref 3.5–5.1)
SODIUM: 137 mmol/L (ref 135–145)

## 2015-11-11 LAB — LIPID PANEL
Cholesterol: 96 mg/dL (ref 0–200)
HDL: 47 mg/dL (ref >40)
LDL Cholesterol: 40 mg/dL (ref 0–99)
Total CHOL/HDL Ratio: 2 RATIO
Triglycerides: 47 mg/dL (ref <150)
VLDL: 9 mg/dL (ref 0–40)

## 2015-11-11 LAB — BRAIN NATRIURETIC PEPTIDE: B NATRIURETIC PEPTIDE 5: 422.9 pg/mL — AB (ref 0.0–100.0)

## 2015-11-11 NOTE — Patient Instructions (Signed)
Stop Isosorbide  Labs today  We will contact you in 2 months to schedule your next appointment.

## 2015-11-12 NOTE — Progress Notes (Signed)
Patient ID: Henry Neal, male   DOB: July 29, 1927, 82 y.o.   MRN: [id number] PCP: Dr. Ashby Dawes Cardiology: Dr. Aundra Dubin  82 yo with history of CAD, chronic systolic CHF, LBBB, AAA s/p repair presents for cardiology followup.  He had a cardiac cath in 12/15, showing total occlusion of the LAD and 50-60% proximal RCA.  EF at that time was near-normal.  In 10/16, he developed dyspnea and epigastric pain.  He was noted to be in acute pulmonary edema and was diuresed.  Troponin was only mildly elevated at 0.47.  He ultimately underwent LHC showing hazy 80-90% RCA stenosis which was likely the culprit for his presentation.  Additionally, the LAD was only subtotally occluded on this cath.  He had DES to RCA.  Cardiac MRI was done, showing EF 22% with significant viability in LAD territory.  Therefore, CTO of LAD was opened with DES.    Henry Neal is doing well today.  No significant exertional dyspnea.  He has started cardiac rehab. Weight is down 5 lbs. SBP is soft in the 90s-100s. He gets lightheaded at times if he stands fast.  No orthopnea/PND.  No chest pain.  Main problem now is low back pain and sciatica.   ECG: NSR, LBBB with QRS 176 msec  Labs (10/16): hgb 9.7, K 4.6, creatinine 1.12 Labs (11/16): K 5.9, creatinine 1.5 Labs (12/16); K 4.2, creatinine 1.07 => 1.02, BNP 448  PMH: 1. CKD 2. AAA s/p repair, followed at [facility]. 3. Carotid stenosis: s/p right CEA.   Carotid dopplers (2/16) with right CEA stable, < [id number] LICA stenosis.  Followed at [facility].  4. Chronic LBBB 5. LAD: LHC (12/15) with totally occluded LAD, 50-60% pRCA => medically managed.  10/16 admitted with NSTEMI.  LHC with subtotalled LAD occlusion, 80-90% pRCA with thrombus => DES RCA initially.  Cardiac MRI was done showing viability in the LAD territory, so he had DES to the CTO LAD.   6. Chronic systolic CHF: Ischemic cardiomyopathy.  Echo (10/16) with EF 15% with wall motion abnormalities, moderately dilated RV with normal systolic  function.  CMRI (10/16) with severe LV dilation, EF 22% with septal-lateral dyssynchrony, no LV thrombus, normal RV size/systolic function => significant viability noted in the LAD territory.   7. ABIs (10/16) were normal.  8. Raynaud's syndrome.  9. 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   Current Outpatient Prescriptions  Medication Sig Dispense Refill  . aspirin EC 81 MG tablet Take 81 mg by mouth daily.      Marland Kitchen atorvastatin (LIPITOR) 40 MG tablet Take 1 tablet (40 mg total) by mouth daily at 6 PM. 30 tablet 6  . Calcium Carbonate-Vitamin D (CALCIUM 600 + D PO) Take 1 tablet by mouth 2 (two) times daily.     . carvedilol (COREG) 6.25 MG tablet Take 1 tablet (6.25 mg total) by mouth 2 (two) times daily. 180 tablet 3  . clopidogrel (PLAVIX) 75 MG tablet take 1 tablet by mouth once daily with BREAKFAST 90 tablet 0  . finasteride (PROSCAR) 5 MG tablet take 1 tablet by mouth once daily    . fluticasone (FLONASE) 50 MCG/ACT nasal spray Place 1 spray into both nostrils daily as needed for allergies.     Marland Kitchen gabapentin (NEURONTIN) 300 MG capsule Take 300 mg by mouth 2 (two) times daily.  0  . isosorbide mononitrate (IMDUR) 30 MG  24 hr tablet Take 1 tablet (30 mg total) by mouth daily. 30 tablet 6  . ketoconazole (NIZORAL) 2 % cream apply to affected area ON FACE ONCE DAILY prn  0  . lisinopril (PRINIVIL,ZESTRIL) 5 MG tablet Take 1 tablet (5 mg total) by mouth daily. 30 tablet 3  . meclizine (ANTIVERT) 25 MG tablet Take 25 mg by mouth 2 (two) times daily as needed for dizziness.     . mometasone (ELOCON) 0.1 % cream Apply 1 application topically daily as needed (for irritation).     . Multiple Vitamin (MULTIVITAMIN PO) Take 1 tablet by mouth daily. ALIVE men's vitamin    . NITROSTAT 0.4 MG SL tablet place 1 tablet under the tongue if needed every 5 minutes for chest pain CALL 911 AFTER 3 DOSES 25  tablet 10  . pantoprazole (PROTONIX) 40 MG tablet Take 40 mg by mouth daily.  0   No current facility-administered medications for this encounter.   BP 100/50 mmHg  Pulse 67  Wt 172 lb (78.019 kg)  SpO2 95% General: NAD Neck: No JVD, no thyromegaly or thyroid nodule.  Lungs: Clear to auscultation bilaterally with normal respiratory effort. CV: Nondisplaced PMI.  Heart regular S1/S2, no S3/S4, no murmur.  1+ ankle edema.  No carotid bruit.  Normal pedal pulses.  Abdomen: Soft, nontender, no hepatosplenomegaly, no distention.  Skin: Intact without lesions or rashes.  Neurologic: Alert and oriented x 3.  Psych: Normal affect. Extremities: No clubbing or cyanosis.  HEENT: Normal.   Assessment/Plan: 1. CAD: Recent NSTEMI with DES to RCA (culprit vessel) and subsequent DES to CTO of LAD (cMRI showed viability in the LAD distribution.  No exertional chest pain.  - Continue ASA 81 and Plavix. - Continue statin, check lipids today.  - Continue cardiac rehab.  - Given absence of chest pain and periodic lightheadedness, I will stop Imdur.  2. Chronic systolic CHF: Ischemic cardiomyopathy.  NYHA class II symptoms.  He is not volume overloaded on exam.  We had to stop lisinopril and spironolactone in the past with rise in creatinine and hyperkalemia.  However, he is tolerating low dose lisinopril now with normal K and creatinine.   - Continue Coreg 6.25 mg bid and lisinopril 5 mg daily.  No titration with soft BP.   - BMET/BNP today.  - Repeat echo in 2/17.  If EF remains < 35%, will need to consider CRT-P with very wide LBBB (would avoid ICD given age).   3. Leg pain: Recent ABIs normal.  Suspect sciatica. 4. Carotid stenosis: Followed at [facility].  5. CKD: BMET today.    Followup in 2 months.    Loralie Champagne 11/12/2015

## 2015-11-14 ENCOUNTER — Encounter (HOSPITAL_COMMUNITY)
Admission: RE | Admit: 2015-11-14 | Discharge: 2015-11-14 | Disposition: A | Discharge status: Home / Self Care | Payer: Medicare Other | Source: Ambulatory Visit | Attending: Cardiology | Admitting: Cardiology

## 2015-11-14 ENCOUNTER — Encounter (HOSPITAL_COMMUNITY): Payer: Self-pay | Admitting: *Deleted

## 2015-11-14 DIAGNOSIS — I214 Non-ST elevation (NSTEMI) myocardial infarction: Secondary | ICD-10-CM | POA: Diagnosis not present

## 2015-11-16 ENCOUNTER — Encounter (HOSPITAL_COMMUNITY)
Admission: RE | Admit: 2015-11-16 | Discharge: 2015-11-16 | Disposition: A | Discharge status: Home / Self Care | Payer: Medicare Other | Source: Ambulatory Visit | Attending: Cardiology | Admitting: Cardiology

## 2015-11-16 DIAGNOSIS — I214 Non-ST elevation (NSTEMI) myocardial infarction: Secondary | ICD-10-CM | POA: Diagnosis not present

## 2015-11-18 ENCOUNTER — Encounter (HOSPITAL_COMMUNITY)
Admission: RE | Admit: 2015-11-18 | Discharge: 2015-11-18 | Disposition: A | Discharge status: Home / Self Care | Payer: Medicare Other | Source: Ambulatory Visit | Attending: Cardiology | Admitting: Cardiology

## 2015-11-18 DIAGNOSIS — I214 Non-ST elevation (NSTEMI) myocardial infarction: Secondary | ICD-10-CM | POA: Diagnosis not present

## 2015-11-21 ENCOUNTER — Encounter (HOSPITAL_COMMUNITY)
Admission: RE | Admit: 2015-11-21 | Discharge: 2015-11-21 | Disposition: A | Discharge status: Home / Self Care | Payer: Medicare Other | Source: Ambulatory Visit | Attending: Cardiology | Admitting: Cardiology

## 2015-11-21 DIAGNOSIS — I214 Non-ST elevation (NSTEMI) myocardial infarction: Secondary | ICD-10-CM | POA: Diagnosis not present

## 2015-11-21 NOTE — Progress Notes (Signed)
[name] 82 y.o. male Nutrition Note Spoke with pt. Nutrition Plan and Nutrition Survey goals reviewed with pt. Pt is following Step 1 of the Therapeutic Lifestyle Changes diet. Pt with dx of CHF. Per discussion, pt is watching his sodium intake closely and does not use canned/convenience foods. Pt is watching sodium and potassium intake, which pt reports is limiting his diet and has caused him to lose wt. Pt wt is down 16.6 lb over the past year. Pt educated re: low sodium, low potassium diet and ways to increase calorie intake. Pt expressed understanding of the information reviewed. Pt aware of nutrition education classes offered and is unable to attend nutrition classes due to work.  Lab Results  Component Value Date   HGBA1C 6.2* 08/03/2015   Vitals - 1 value per visit 11/11/2015 10/11/2015 10/06/2015 09/20/2015  Weight (lb) 172 177 177.69 176.5   Vitals - 1 value per visit 08/24/2015 08/17/2015 08/16/2015 08/09/2015  Weight (lb) 185.75  180 184.08   Vitals - 1 value per visit 08/05/2015 08/02/2015 07/07/2015 05/18/2015  Weight (lb)   195.1 193.5   Vitals - 1 value per visit 12/08/2014 11/17/2014  Weight (lb) 202 198.6   Nutrition Diagnosis ? Food-and nutrition-related knowledge deficit related to lack of exposure to information as related to diagnosis of: ? CVD ? Pre-DM ? Overweight related to excessive energy intake as evidenced by a BMI of 26.2  Nutrition RX/ Estimated Daily Nutrition Needs for: wt  maintenance  2100-2400 Kcal, 65-75 gm fat, 13-16 gm sat fat, 2.1-2.4 gm trans-fat, <1500 mg sodium  Nutrition Intervention ? Pt's individual nutrition plan reviewed with pt. ? Benefits of adopting Therapeutic Lifestyle Changes discussed when Medficts reviewed. ? Pt to attend the Portion Distortion class  ? Pt given handouts for: ? Nutrition I class ? Nutrition II class ? low sodium ? low potassium ? Continue client-centered nutrition education by RD, as part of interdisciplinary  care. Goal(s) ? Pt to describe the benefit of including low potassium fruits, vegetables, whole grains, and low-fat dairy products in a heart healthy meal plan. Monitor and Evaluate progress toward nutrition goal with team. Nutrition Risk: Change to Moderate Derek Mound, M.Ed, RD, LDN, CDE 11/21/2015 9:45 AM

## 2015-11-23 ENCOUNTER — Encounter (HOSPITAL_COMMUNITY)
Admission: RE | Admit: 2015-11-23 | Discharge: 2015-11-23 | Disposition: A | Discharge status: Home / Self Care | Payer: Medicare Other | Source: Ambulatory Visit | Attending: Cardiology | Admitting: Cardiology

## 2015-11-23 DIAGNOSIS — I214 Non-ST elevation (NSTEMI) myocardial infarction: Secondary | ICD-10-CM | POA: Insufficient documentation

## 2015-11-23 DIAGNOSIS — Z955 Presence of coronary angioplasty implant and graft: Secondary | ICD-10-CM | POA: Diagnosis not present

## 2015-11-25 ENCOUNTER — Encounter (HOSPITAL_COMMUNITY)
Admission: RE | Admit: 2015-11-25 | Discharge: 2015-11-25 | Disposition: A | Discharge status: Home / Self Care | Payer: Medicare Other | Source: Ambulatory Visit | Attending: Cardiology | Admitting: Cardiology

## 2015-11-25 DIAGNOSIS — I214 Non-ST elevation (NSTEMI) myocardial infarction: Secondary | ICD-10-CM | POA: Diagnosis not present

## 2015-11-28 ENCOUNTER — Encounter (HOSPITAL_COMMUNITY)
Admission: RE | Admit: 2015-11-28 | Discharge: 2015-11-28 | Disposition: A | Discharge status: Home / Self Care | Payer: Medicare Other | Source: Ambulatory Visit | Attending: Cardiology | Admitting: Cardiology

## 2015-11-28 DIAGNOSIS — I214 Non-ST elevation (NSTEMI) myocardial infarction: Secondary | ICD-10-CM | POA: Diagnosis not present

## 2015-11-29 ENCOUNTER — Other Ambulatory Visit: Payer: Self-pay

## 2015-11-29 ENCOUNTER — Encounter: Payer: Self-pay | Admitting: Cardiology

## 2015-11-29 ENCOUNTER — Ambulatory Visit (HOSPITAL_COMMUNITY): Payer: Medicare Other | Attending: Cardiovascular Disease

## 2015-11-29 DIAGNOSIS — I5022 Chronic systolic (congestive) heart failure: Secondary | ICD-10-CM | POA: Diagnosis not present

## 2015-11-29 DIAGNOSIS — I509 Heart failure, unspecified: Secondary | ICD-10-CM | POA: Diagnosis present

## 2015-11-29 DIAGNOSIS — I517 Cardiomegaly: Secondary | ICD-10-CM | POA: Insufficient documentation

## 2015-11-29 DIAGNOSIS — R29898 Other symptoms and signs involving the musculoskeletal system: Secondary | ICD-10-CM | POA: Diagnosis not present

## 2015-11-29 DIAGNOSIS — I371 Nonrheumatic pulmonary valve insufficiency: Secondary | ICD-10-CM | POA: Diagnosis not present

## 2015-11-29 DIAGNOSIS — I071 Rheumatic tricuspid insufficiency: Secondary | ICD-10-CM | POA: Diagnosis not present

## 2015-11-29 DIAGNOSIS — I10 Essential (primary) hypertension: Secondary | ICD-10-CM | POA: Diagnosis not present

## 2015-11-29 DIAGNOSIS — I340 Nonrheumatic mitral (valve) insufficiency: Secondary | ICD-10-CM | POA: Insufficient documentation

## 2015-11-29 DIAGNOSIS — E785 Hyperlipidemia, unspecified: Secondary | ICD-10-CM | POA: Diagnosis not present

## 2015-11-30 ENCOUNTER — Encounter (HOSPITAL_COMMUNITY)
Admission: RE | Admit: 2015-11-30 | Discharge: 2015-11-30 | Disposition: A | Discharge status: Home / Self Care | Payer: Medicare Other | Source: Ambulatory Visit | Attending: Cardiology | Admitting: Cardiology

## 2015-11-30 ENCOUNTER — Emergency Department (HOSPITAL_COMMUNITY): Payer: No Typology Code available for payment source

## 2015-11-30 ENCOUNTER — Encounter (HOSPITAL_COMMUNITY): Payer: Self-pay

## 2015-11-30 ENCOUNTER — Emergency Department (HOSPITAL_COMMUNITY)
Admission: EM | Admit: 2015-11-30 | Discharge: 2015-11-30 | Disposition: A | Discharge status: Home / Self Care | Payer: No Typology Code available for payment source | Attending: Emergency Medicine | Admitting: Emergency Medicine

## 2015-11-30 DIAGNOSIS — E785 Hyperlipidemia, unspecified: Secondary | ICD-10-CM | POA: Insufficient documentation

## 2015-11-30 DIAGNOSIS — Z87438 Personal history of other diseases of male genital organs: Secondary | ICD-10-CM | POA: Diagnosis not present

## 2015-11-30 DIAGNOSIS — Z79899 Other long term (current) drug therapy: Secondary | ICD-10-CM | POA: Diagnosis not present

## 2015-11-30 DIAGNOSIS — Z7982 Long term (current) use of aspirin: Secondary | ICD-10-CM | POA: Diagnosis not present

## 2015-11-30 DIAGNOSIS — Z87891 Personal history of nicotine dependence: Secondary | ICD-10-CM | POA: Diagnosis not present

## 2015-11-30 DIAGNOSIS — Z7951 Long term (current) use of inhaled steroids: Secondary | ICD-10-CM | POA: Insufficient documentation

## 2015-11-30 DIAGNOSIS — S199XXA Unspecified injury of neck, initial encounter: Secondary | ICD-10-CM | POA: Diagnosis not present

## 2015-11-30 DIAGNOSIS — M1990 Unspecified osteoarthritis, unspecified site: Secondary | ICD-10-CM | POA: Insufficient documentation

## 2015-11-30 DIAGNOSIS — S4992XA Unspecified injury of left shoulder and upper arm, initial encounter: Secondary | ICD-10-CM | POA: Insufficient documentation

## 2015-11-30 DIAGNOSIS — M542 Cervicalgia: Secondary | ICD-10-CM

## 2015-11-30 DIAGNOSIS — S4991XA Unspecified injury of right shoulder and upper arm, initial encounter: Secondary | ICD-10-CM | POA: Insufficient documentation

## 2015-11-30 DIAGNOSIS — S0990XA Unspecified injury of head, initial encounter: Secondary | ICD-10-CM | POA: Insufficient documentation

## 2015-11-30 DIAGNOSIS — I25119 Atherosclerotic heart disease of native coronary artery with unspecified angina pectoris: Secondary | ICD-10-CM | POA: Insufficient documentation

## 2015-11-30 DIAGNOSIS — K219 Gastro-esophageal reflux disease without esophagitis: Secondary | ICD-10-CM | POA: Diagnosis not present

## 2015-11-30 DIAGNOSIS — Y9389 Activity, other specified: Secondary | ICD-10-CM | POA: Insufficient documentation

## 2015-11-30 DIAGNOSIS — T148 Other injury of unspecified body region: Secondary | ICD-10-CM | POA: Diagnosis not present

## 2015-11-30 DIAGNOSIS — Y92410 Unspecified street and highway as the place of occurrence of the external cause: Secondary | ICD-10-CM | POA: Diagnosis not present

## 2015-11-30 DIAGNOSIS — Z85828 Personal history of other malignant neoplasm of skin: Secondary | ICD-10-CM | POA: Insufficient documentation

## 2015-11-30 DIAGNOSIS — M25512 Pain in left shoulder: Secondary | ICD-10-CM | POA: Diagnosis not present

## 2015-11-30 DIAGNOSIS — Z9889 Other specified postprocedural states: Secondary | ICD-10-CM | POA: Diagnosis not present

## 2015-11-30 DIAGNOSIS — I214 Non-ST elevation (NSTEMI) myocardial infarction: Secondary | ICD-10-CM | POA: Diagnosis not present

## 2015-11-30 DIAGNOSIS — Y998 Other external cause status: Secondary | ICD-10-CM | POA: Diagnosis not present

## 2015-11-30 DIAGNOSIS — I10 Essential (primary) hypertension: Secondary | ICD-10-CM | POA: Insufficient documentation

## 2015-11-30 MED ORDER — ACETAMINOPHEN 325 MG PO TABS: 650.0000 mg | ORAL_TABLET | Freq: Four times a day (QID) | ORAL | Status: DC | PRN

## 2015-11-30 MED ORDER — ACETAMINOPHEN 325 MG PO TABS
650.0000 mg | ORAL_TABLET | Freq: Once | ORAL | Status: AC
  Administered 2015-11-30: 650 mg via ORAL
  Filled 2015-11-30: qty 2

## 2015-11-30 NOTE — Discharge Instructions (Signed)
Your CT scans were normal. Please call your primary care provider to schedule a follow up appointment this week. Return to the ER for new or worsening symptoms.
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
## 2015-12-01 DIAGNOSIS — I10 Essential (primary) hypertension: Secondary | ICD-10-CM | POA: Diagnosis not present

## 2015-12-01 DIAGNOSIS — E782 Mixed hyperlipidemia: Secondary | ICD-10-CM | POA: Diagnosis not present

## 2015-12-02 ENCOUNTER — Encounter (HOSPITAL_COMMUNITY)
Admission: RE | Admit: 2015-12-02 | Discharge: 2015-12-02 | Disposition: A | Discharge status: Home / Self Care | Payer: Medicare Other | Source: Ambulatory Visit | Attending: Cardiology | Admitting: Cardiology

## 2015-12-02 DIAGNOSIS — I214 Non-ST elevation (NSTEMI) myocardial infarction: Secondary | ICD-10-CM | POA: Diagnosis not present

## 2015-12-04 ENCOUNTER — Other Ambulatory Visit (HOSPITAL_COMMUNITY): Payer: Self-pay | Admitting: Student

## 2015-12-05 ENCOUNTER — Encounter (HOSPITAL_COMMUNITY)
Admission: RE | Admit: 2015-12-05 | Discharge: 2015-12-05 | Disposition: A | Discharge status: Home / Self Care | Payer: Medicare Other | Source: Ambulatory Visit | Attending: Cardiology | Admitting: Cardiology

## 2015-12-05 DIAGNOSIS — I214 Non-ST elevation (NSTEMI) myocardial infarction: Secondary | ICD-10-CM | POA: Diagnosis not present

## 2015-12-06 ENCOUNTER — Encounter: Payer: Self-pay | Admitting: Podiatry

## 2015-12-06 ENCOUNTER — Ambulatory Visit (INDEPENDENT_AMBULATORY_CARE_PROVIDER_SITE_OTHER): Payer: Medicare Other | Admitting: Podiatry

## 2015-12-06 VITALS — BP 131/58 | HR 66 | Resp 16

## 2015-12-06 DIAGNOSIS — M5432 Sciatica, left side: Secondary | ICD-10-CM
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
## 2015-12-07 ENCOUNTER — Encounter (HOSPITAL_COMMUNITY): Payer: Medicare Other

## 2015-12-07 ENCOUNTER — Telehealth (HOSPITAL_COMMUNITY): Payer: Self-pay | Admitting: Cardiac Rehabilitation

## 2015-12-07 DIAGNOSIS — M25561 Pain in right knee: Secondary | ICD-10-CM | POA: Diagnosis not present

## 2015-12-07 DIAGNOSIS — M545 Low back pain: Secondary | ICD-10-CM | POA: Diagnosis not present

## 2015-12-07 DIAGNOSIS — M25461 Effusion, right knee: Secondary | ICD-10-CM | POA: Diagnosis not present
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
## 2015-12-08 DIAGNOSIS — I10 Essential (primary) hypertension: Secondary | ICD-10-CM | POA: Diagnosis not present

## 2015-12-08 DIAGNOSIS — M150 Primary generalized (osteo)arthritis: Secondary | ICD-10-CM | POA: Diagnosis not present

## 2015-12-08 DIAGNOSIS — K5901 Slow transit constipation: Secondary | ICD-10-CM | POA: Diagnosis not present

## 2015-12-08 DIAGNOSIS — E782 Mixed hyperlipidemia: Secondary | ICD-10-CM | POA: Diagnosis not present

## 2015-12-09 ENCOUNTER — Encounter (HOSPITAL_COMMUNITY): Payer: Medicare Other

## 2015-12-12 ENCOUNTER — Encounter (HOSPITAL_COMMUNITY)
Admission: RE | Admit: 2015-12-12 | Discharge: 2015-12-12 | Disposition: A | Discharge status: Home / Self Care | Payer: Medicare Other | Source: Ambulatory Visit | Attending: Cardiology | Admitting: Cardiology

## 2015-12-12 DIAGNOSIS — I214 Non-ST elevation (NSTEMI) myocardial infarction: Secondary | ICD-10-CM | POA: Diagnosis not present

## 2015-12-14 ENCOUNTER — Encounter (HOSPITAL_COMMUNITY)
Admission: RE | Admit: 2015-12-14 | Discharge: 2015-12-14 | Disposition: A | Discharge status: Home / Self Care | Payer: Medicare Other | Source: Ambulatory Visit | Attending: Cardiology | Admitting: Cardiology

## 2015-12-14 DIAGNOSIS — I214 Non-ST elevation (NSTEMI) myocardial infarction: Secondary | ICD-10-CM | POA: Diagnosis not present

## 2015-12-15 ENCOUNTER — Encounter (HOSPITAL_COMMUNITY): Payer: Medicare Other

## 2015-12-15 ENCOUNTER — Encounter: Payer: Self-pay | Admitting: Vascular Surgery

## 2015-12-15 ENCOUNTER — Ambulatory Visit (INDEPENDENT_AMBULATORY_CARE_PROVIDER_SITE_OTHER)
Admission: RE | Admit: 2015-12-15 | Discharge: 2015-12-15 | Disposition: A | Discharge status: Home / Self Care | Payer: Medicare Other | Source: Ambulatory Visit | Attending: Vascular Surgery | Admitting: Vascular Surgery

## 2015-12-15 ENCOUNTER — Ambulatory Visit: Payer: Medicare Other | Admitting: Vascular Surgery

## 2015-12-15 ENCOUNTER — Other Ambulatory Visit (HOSPITAL_COMMUNITY): Payer: Medicare Other

## 2015-12-15 ENCOUNTER — Ambulatory Visit (HOSPITAL_COMMUNITY)
Admission: RE | Admit: 2015-12-15 | Discharge: 2015-12-15 | Disposition: A | Discharge status: Home / Self Care | Payer: Medicare Other | Source: Ambulatory Visit | Attending: Vascular Surgery | Admitting: Vascular Surgery

## 2015-12-15 DIAGNOSIS — I714 Abdominal aortic aneurysm, without rupture, unspecified: Secondary | ICD-10-CM

## 2015-12-15 DIAGNOSIS — I6521 Occlusion and stenosis of right carotid artery: Secondary | ICD-10-CM

## 2015-12-15 DIAGNOSIS — Z48812 Encounter for surgical aftercare following surgery on the circulatory system: Secondary | ICD-10-CM

## 2015-12-15 DIAGNOSIS — I83893 Varicose veins of bilateral lower extremities with other complications: Secondary | ICD-10-CM

## 2015-12-15 DIAGNOSIS — I6522 Occlusion and stenosis of left carotid artery: Secondary | ICD-10-CM | POA: Diagnosis not present

## 2015-12-16 ENCOUNTER — Encounter (HOSPITAL_COMMUNITY)
Admission: RE | Admit: 2015-12-16 | Discharge: 2015-12-16 | Disposition: A | Discharge status: Home / Self Care | Payer: Medicare Other | Source: Ambulatory Visit | Attending: Cardiology | Admitting: Cardiology

## 2015-12-16 DIAGNOSIS — I214 Non-ST elevation (NSTEMI) myocardial infarction: Secondary | ICD-10-CM | POA: Diagnosis not present

## 2015-12-19 ENCOUNTER — Encounter (HOSPITAL_COMMUNITY)
Admission: RE | Admit: 2015-12-19 | Discharge: 2015-12-19 | Disposition: A | Discharge status: Home / Self Care | Payer: Medicare Other | Source: Ambulatory Visit | Attending: Cardiology | Admitting: Cardiology

## 2015-12-19 DIAGNOSIS — I214 Non-ST elevation (NSTEMI) myocardial infarction: Secondary | ICD-10-CM | POA: Diagnosis not present

## 2015-12-21 ENCOUNTER — Encounter (HOSPITAL_COMMUNITY)
Admission: RE | Admit: 2015-12-21 | Discharge: 2015-12-21 | Disposition: A | Discharge status: Home / Self Care | Payer: Medicare Other | Source: Ambulatory Visit | Attending: Cardiology | Admitting: Cardiology

## 2015-12-21 DIAGNOSIS — I214 Non-ST elevation (NSTEMI) myocardial infarction: Secondary | ICD-10-CM | POA: Diagnosis not present

## 2015-12-21 DIAGNOSIS — Z955 Presence of coronary angioplasty implant and graft: Secondary | ICD-10-CM | POA: Insufficient documentation

## 2015-12-22 ENCOUNTER — Encounter: Payer: Self-pay | Admitting: Vascular Surgery

## 2015-12-22 ENCOUNTER — Ambulatory Visit (INDEPENDENT_AMBULATORY_CARE_PROVIDER_SITE_OTHER): Payer: Medicare Other | Admitting: Vascular Surgery

## 2015-12-22 VITALS — BP 109/45 | HR 59 | Temp 97.4°F | Resp 16 | Ht 70.0 in | Wt 172.0 lb

## 2015-12-22 DIAGNOSIS — I714 Abdominal aortic aneurysm, without rupture, unspecified: Secondary | ICD-10-CM

## 2015-12-22 DIAGNOSIS — I6529 Occlusion and stenosis of unspecified carotid artery: Secondary | ICD-10-CM | POA: Diagnosis not present

## 2015-12-22 NOTE — Progress Notes (Signed)
Patient is an 82 year old male who returns today for followup after right carotid endarterectomy. This was done in January 2015. He reports no episodes of TIA amaurosis stroke weakness or numbness in his extremities. He did have myocardial infarction in the fall and was treated by Dr. Martinique for this. He also previously had aneurysm stent graft repair in 2010. Continue to follow him for this as well. Patient also complains of bilateral lower extremity swelling. He does wear 20-30 mmHg compression stockings which are thigh-high. He states he is compliant with these. He has been wearing these for several months. He does get some relief while wearing these but states that the veins are still a nuisance problem for him.  Physical exam:       Filed Vitals:   12/22/15 1140 12/22/15 1141  BP: 103/44 109/45  Pulse: 60 59  Temp:  97.4 F (36.3 C)  TempSrc:  Oral  Resp:  16  Height:  5\' 10"  (1.778 m)  Weight:  172 lb (78.019 kg)  SpO2:  97%    Neck: Well-healed neck incision   Neuro: Symmetric upper extremity and lower extremity motor strength 5 over 5 Abdomen: Soft nontender nondistended no pulsatile mass 2+ femoral pulses 2+ posterior tibial pulses Extremities: Multiple clusters of varicosities right worse than left vein diameter 4-6 mm clusters primarily right posterior medial calf bilaterally with areas of spider and reticular veins bilaterally near the ankle some brawny staining of the gaiter area no ulceration  Data: UA 23rd 2017 Aortic ultrasound aneurysm diameter 3.4 cm no evidence of type I or type II endoleak.  Carotid duplex showed no significant recurrent stenosis. He also had reflux duplex exam of his venous system. This showed some reflux in the common femoral vein on the right side. He did have bilateral greater saphenous reflux. Over this was fairly segmental in the left leg. On the right leg his vein diameter was 5-6 mm.    Assessment: Doing well status post right carotid  endarterectomy.  Abdominal aortic stent graft in good position with no evidence of endoleak aneurysm diameter 3.4 cm. Bilateral symptomatic varicose veins   Plan: The patient will follow-up with [place] in one year for a carotid duplex exam as well as an aneurysm ultrasound. We will also see how he does over the next year with compression stockings for treating his varicose veins. If his symptoms are not relieved by this we will consider laser ablation. However, the patient is 82 years old and to embark on a procedure for varicose veins I think may not be worth the benefit.    Ruta Hinds, MD [facility] Office: [phone] Pager: [phone]

## 2015-12-23 ENCOUNTER — Encounter (HOSPITAL_COMMUNITY): Payer: Medicare Other

## 2015-12-26 ENCOUNTER — Encounter (HOSPITAL_COMMUNITY)
Admission: RE | Admit: 2015-12-26 | Discharge: 2015-12-26 | Disposition: A | Discharge status: Home / Self Care | Payer: Medicare Other | Source: Ambulatory Visit | Attending: Cardiology | Admitting: Cardiology

## 2015-12-26 DIAGNOSIS — I214 Non-ST elevation (NSTEMI) myocardial infarction: Secondary | ICD-10-CM | POA: Diagnosis not present

## 2015-12-27 ENCOUNTER — Ambulatory Visit (HOSPITAL_COMMUNITY)
Admission: RE | Admit: 2015-12-27 | Discharge: 2015-12-27 | Disposition: A | Discharge status: Home / Self Care | Payer: Medicare Other | Source: Ambulatory Visit | Attending: Cardiology | Admitting: Cardiology

## 2015-12-27 VITALS — BP 126/52 | HR 75 | Wt 172.2 lb

## 2015-12-27 DIAGNOSIS — I739 Peripheral vascular disease, unspecified: Secondary | ICD-10-CM | POA: Diagnosis not present

## 2015-12-27 DIAGNOSIS — Z8249 Family history of ischemic heart disease and other diseases of the circulatory system: Secondary | ICD-10-CM | POA: Diagnosis not present

## 2015-12-27 DIAGNOSIS — I7300 Raynaud's syndrome without gangrene: Secondary | ICD-10-CM | POA: Diagnosis not present

## 2015-12-27 DIAGNOSIS — Z7902 Long term (current) use of antithrombotics/antiplatelets: Secondary | ICD-10-CM | POA: Insufficient documentation

## 2015-12-27 DIAGNOSIS — Z87891 Personal history of nicotine dependence: Secondary | ICD-10-CM | POA: Diagnosis not present

## 2015-12-27 DIAGNOSIS — I5022 Chronic systolic (congestive) heart failure: Secondary | ICD-10-CM | POA: Diagnosis not present

## 2015-12-27 DIAGNOSIS — Z955 Presence of coronary angioplasty implant and graft: Secondary | ICD-10-CM | POA: Diagnosis not present

## 2015-12-27 DIAGNOSIS — I6522 Occlusion and stenosis of left carotid artery: Secondary | ICD-10-CM | POA: Diagnosis not present

## 2015-12-27 DIAGNOSIS — Z79899 Other long term (current) drug therapy: Secondary | ICD-10-CM | POA: Diagnosis not present

## 2015-12-27 DIAGNOSIS — I252 Old myocardial infarction: Secondary | ICD-10-CM | POA: Diagnosis not present

## 2015-12-27 DIAGNOSIS — Z7982 Long term (current) use of aspirin: Secondary | ICD-10-CM | POA: Diagnosis not present

## 2015-12-27 DIAGNOSIS — N189 Chronic kidney disease, unspecified: Secondary | ICD-10-CM | POA: Insufficient documentation

## 2015-12-27 DIAGNOSIS — M79606 Pain in leg, unspecified: Secondary | ICD-10-CM | POA: Insufficient documentation

## 2015-12-27 DIAGNOSIS — I255 Ischemic cardiomyopathy: Secondary | ICD-10-CM | POA: Insufficient documentation

## 2015-12-27 DIAGNOSIS — I2510 Atherosclerotic heart disease of native coronary artery without angina pectoris: Secondary | ICD-10-CM | POA: Insufficient documentation

## 2015-12-27 DIAGNOSIS — I447 Left bundle-branch block, unspecified: Secondary | ICD-10-CM | POA: Diagnosis not present

## 2015-12-27 MED ORDER — SPIRONOLACTONE 25 MG PO TABS: 12.5000 mg | ORAL_TABLET | Freq: Every day | ORAL | Status: DC

## 2015-12-27 NOTE — Progress Notes (Signed)
Patient ID: Henry Neal, male   DOB: June 05, 1927, 82 y.o.   MRN: [id number] PCP: Dr. Ashby Dawes Cardiology: Dr. Aundra Dubin  82 yo with history of CAD, chronic systolic CHF, LBBB, AAA s/p repair presents for cardiology followup.  He had a cardiac cath in 12/15, showing total occlusion of the LAD and 50-60% proximal RCA.  EF at that time was near-normal.  In 10/16, he developed dyspnea and epigastric pain.  He was noted to be in acute pulmonary edema and was diuresed.  Troponin was only mildly elevated at 0.47.  He ultimately underwent LHC showing hazy 80-90% RCA stenosis which was likely the culprit for his presentation.  Additionally, the LAD was only subtotally occluded on this cath.  He had DES to RCA.  Cardiac MRI was done, showing EF 22% with significant viability in LAD territory.  Therefore, CTO of LAD was opened with DES.  Echo in 2/17 showed EF persistently low at 15%.    Mr Ekberg is doing well today.  He is doing cardiac rehab. Dyspnea only with heavy activity.  Some fatigue.  Still working full time.  Occasional atypical chest pain, not exertional.  No orthopnea/PND.  Main problem now is low back pain and sciatica.   ECG: NSR, LBBB with QRS 176 msec  Labs (10/16): hgb 9.7, K 4.6, creatinine 1.12 Labs (11/16): K 5.9, creatinine 1.5 Labs (12/16); K 4.2, creatinine 1.07 => 1.02, BNP 448 Labs (1/17): K 4.1, creatinine 1.13, BNP 423, LDL 40, HDL 47  PMH: 1. CKD 2. AAA s/p repair, followed at [facility]. 3. Carotid stenosis: s/p right CEA.   Carotid dopplers (2/16) with right CEA stable, < [id number] LICA stenosis.  Followed at [facility].  4. Chronic LBBB 5. LAD: LHC (12/15) with totally occluded LAD, 50-60% pRCA => medically managed.  10/16 admitted with NSTEMI.  LHC with subtotalled LAD occlusion, 80-90% pRCA with thrombus => DES RCA initially.  Cardiac MRI was done showing viability in the LAD territory, so he had DES to the CTO LAD.   6. Chronic systolic CHF: Ischemic cardiomyopathy.  Echo (10/16) with EF  15% with wall motion abnormalities, moderately dilated RV with normal systolic function.  CMRI (10/16) with severe LV dilation, EF 22% with septal-lateral dyssynchrony, no LV thrombus, normal RV size/systolic function => significant viability noted in the LAD territory.  Echo (2/17): EF 15%, severe LV dilation, wall motion abnormalities noted, mild MR, mildly dilated RV with normal systolic function, PASP 55 mmHg.   7. ABIs (10/16) were normal.  8. Raynaud's syndrome.  9. 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   Current Outpatient Prescriptions  Medication Sig Dispense Refill  . acetaminophen (TYLENOL) 325 MG tablet Take 2 tablets (650 mg total) by mouth every 6 (six) hours as needed. 30 tablet 0  . aspirin EC 81 MG tablet Take 81 mg by mouth daily.      Marland Kitchen atorvastatin (LIPITOR) 40 MG tablet Take 1 tablet (40 mg total) by mouth daily at 6 PM. 30 tablet 6  . Calcium Carbonate-Vitamin D (CALCIUM 600 + D PO) Take 1 tablet by mouth 2 (two) times daily.     . carvedilol (COREG) 6.25 MG tablet Take 1 tablet (6.25 mg total) by mouth 2 (two) times daily. 180 tablet 3  . clopidogrel (PLAVIX) 75 MG tablet take 1 tablet by mouth once daily WITH BREAKFAST 90 tablet 0  .  clotrimazole (LOTRIMIN) 1 % cream Apply sparingly to affected area twice daily for 2 weeks.  0  . diclofenac (VOLTAREN) 50 MG EC tablet Take 50 mg by mouth 2 (two) times daily. Reported on 12/22/2015  0  . finasteride (PROSCAR) 5 MG tablet take 1 tablet by mouth once daily    . fluticasone (FLONASE) 50 MCG/ACT nasal spray Place 1 spray into both nostrils daily as needed for allergies.     Marland Kitchen gabapentin (NEURONTIN) 300 MG capsule Take 300 mg by mouth 2 (two) times daily.  0  . ketoconazole (NIZORAL) 2 % cream apply to affected area ON FACE ONCE DAILY prn  0  . lisinopril (PRINIVIL,ZESTRIL) 5 MG tablet Take 5 mg by mouth daily.  0  . meclizine  (ANTIVERT) 25 MG tablet Take 25 mg by mouth 2 (two) times daily as needed for dizziness.     . mometasone (ELOCON) 0.1 % cream Apply 1 application topically daily as needed (for irritation).     . Multiple Vitamin (MULTIVITAMIN PO) Take 1 tablet by mouth daily. ALIVE men's vitamin    . NITROSTAT 0.4 MG SL tablet place 1 tablet under the tongue if needed every 5 minutes for chest pain CALL 911 AFTER 3 DOSES 25 tablet 10  . pantoprazole (PROTONIX) 40 MG tablet Take 40 mg by mouth daily.  0  . triamcinolone cream (KENALOG) 0.1 %   0  . spironolactone (ALDACTONE) 25 MG tablet Take 0.5 tablets (12.5 mg total) by mouth daily. 15 tablet 3   No current facility-administered medications for this encounter.   BP 126/52 mmHg  Pulse 75  Wt 172 lb 4 oz (78.132 kg)  SpO2 97% General: NAD Neck: No JVD, no thyromegaly or thyroid nodule.  Lungs: Clear to auscultation bilaterally with normal respiratory effort. CV: Nondisplaced PMI.  Heart regular S1/S2, no S3/S4, no murmur.  1+ ankle edema.  No carotid bruit.  Normal pedal pulses.  Abdomen: Soft, nontender, no hepatosplenomegaly, no distention.  Skin: Intact without lesions or rashes.  Neurologic: Alert and oriented x 3.  Psych: Normal affect. Extremities: No clubbing or cyanosis.  HEENT: Normal.   Assessment/Plan: 1. CAD: Recent NSTEMI with DES to RCA (culprit vessel) and subsequent DES to CTO of LAD (cMRI showed viability in the LAD distribution).  No exertional chest pain.  - Continue ASA 81 and Plavix. - Continue statin, good lipids in 1/17.  - Continue cardiac rehab.  2. Chronic systolic CHF: Ischemic cardiomyopathy.  NYHA class II symptoms.  He is not volume overloaded on exam.  EF is persistently low, 15% on echo in 2/17.    - Continue Coreg 6.25 mg bid and lisinopril 5 mg daily.   - I will try him again on low dose spironolactone, 12.5 daily.  Will need to follow K closely, BMET in 1 week.   - EF remains low and he has a wide LBBB.  Though he  is 44, he is very active and works full time as a Furniture conservator/restorer.  I think CRT-P would be reasonable here.  I am going to refer him to EP.    3. Leg pain:  ABIs normal.  Suspect sciatica. 4. Carotid stenosis: Followed at [facility].  5. CKD: BMET 1 week.     Followup in 6 weeks.     Loralie Champagne 12/27/2015

## 2015-12-27 NOTE — Patient Instructions (Signed)
Start Spironolactone 12.5 mg (1/2 tab) daily  Labs in 1 week  You have been referred to EP to discuss getting pacemaker  Your physician recommends that you schedule a follow-up appointment in: 6 weeks

## 2015-12-28 ENCOUNTER — Encounter (HOSPITAL_COMMUNITY)
Admission: RE | Admit: 2015-12-28 | Discharge: 2015-12-28 | Disposition: A | Discharge status: Home / Self Care | Payer: Medicare Other | Source: Ambulatory Visit | Attending: Cardiology | Admitting: Cardiology

## 2015-12-28 ENCOUNTER — Telehealth: Payer: Self-pay | Admitting: Cardiology

## 2015-12-28 DIAGNOSIS — I214 Non-ST elevation (NSTEMI) myocardial infarction: Secondary | ICD-10-CM | POA: Diagnosis not present

## 2015-12-28 NOTE — Telephone Encounter (Signed)
Spoke with pt, he was referred to EP per dr Aundra Dubin, per the last office note. ? CRT-P, Follow up scheduled with dr Lovena Le.

## 2015-12-28 NOTE — Telephone Encounter (Signed)
Pt said Dr Aundra Dubin wanted him to see another doctor,he wants to know who is supposed to see?

## 2015-12-30 ENCOUNTER — Encounter (HOSPITAL_COMMUNITY)
Admission: RE | Admit: 2015-12-30 | Discharge: 2015-12-30 | Disposition: A | Discharge status: Home / Self Care | Payer: Medicare Other | Source: Ambulatory Visit | Attending: Cardiology | Admitting: Cardiology

## 2015-12-30 DIAGNOSIS — I214 Non-ST elevation (NSTEMI) myocardial infarction: Secondary | ICD-10-CM | POA: Diagnosis not present

## 2016-01-02 ENCOUNTER — Encounter (HOSPITAL_COMMUNITY)
Admission: RE | Admit: 2016-01-02 | Discharge: 2016-01-02 | Disposition: A | Discharge status: Home / Self Care | Payer: Medicare Other | Source: Ambulatory Visit | Attending: Cardiology | Admitting: Cardiology

## 2016-01-02 DIAGNOSIS — I214 Non-ST elevation (NSTEMI) myocardial infarction: Secondary | ICD-10-CM | POA: Diagnosis not present

## 2016-01-03 ENCOUNTER — Encounter: Payer: Self-pay | Admitting: Internal Medicine

## 2016-01-03 ENCOUNTER — Ambulatory Visit (INDEPENDENT_AMBULATORY_CARE_PROVIDER_SITE_OTHER): Payer: Medicare Other | Admitting: Internal Medicine

## 2016-01-03 VITALS — BP 130/46 | HR 56 | Ht 70.5 in | Wt 172.0 lb

## 2016-01-03 DIAGNOSIS — I5022 Chronic systolic (congestive) heart failure: Secondary | ICD-10-CM

## 2016-01-03 NOTE — Progress Notes (Signed)
HPI Henry Neal is referred today to consider BiV PPM insertion. He is a pleasant 82 yo man who still works. He is s/p remote MI with occluded LAD and RCA and has severe LV dysfunction. The patient also has LBBB with a QRS duration of 165 ms. EF is 15% by echo.  He has not had syncope. He denies chest pain. He has class 2-3 CHF symptoms. He denies anginal symptoms. Allergies  Allergen Reactions  . Sinequan [Doxepin Hcl] Other (See Comments)    hallucinations  . Verapamil Other (See Comments)    Caused heart to race  . Procaine Hcl Other (See Comments)    Tingling all over as soon as it was injected     Current Outpatient Prescriptions  Medication Sig Dispense Refill  . acetaminophen (TYLENOL) 325 MG tablet Take 650 mg by mouth every 6 (six) hours as needed for mild pain.    Marland Kitchen aspirin EC 81 MG tablet Take 81 mg by mouth daily.      Marland Kitchen atorvastatin (LIPITOR) 40 MG tablet Take 1 tablet (40 mg total) by mouth daily at 6 PM. 30 tablet 6  . Calcium Carbonate-Vitamin D (CALCIUM 600 + D PO) Take 1 tablet by mouth 2 (two) times daily.     . carvedilol (COREG) 6.25 MG tablet Take 1 tablet (6.25 mg total) by mouth 2 (two) times daily. 180 tablet 3  . clopidogrel (PLAVIX) 75 MG tablet take 1 tablet by mouth once daily WITH BREAKFAST 90 tablet 0  . finasteride (PROSCAR) 5 MG tablet take 1 tablet by mouth once daily    . fluticasone (FLONASE) 50 MCG/ACT nasal spray Place 1 spray into both nostrils daily as needed for allergies.     Marland Kitchen gabapentin (NEURONTIN) 300 MG capsule Take 300 mg by mouth 2 (two) times daily.  0  . ketoconazole (NIZORAL) 2 % cream apply to affected area ON FACE ONCE DAILY prn skin  0  . lisinopril (PRINIVIL,ZESTRIL) 5 MG tablet Take 5 mg by mouth daily.  0  . meclizine (ANTIVERT) 25 MG tablet Take 25 mg by mouth 2 (two) times daily as needed for dizziness.     . mometasone (ELOCON) 0.1 % cream Apply 1 application topically daily as needed (for irritation).     . Multiple  Vitamin (MULTIVITAMIN PO) Take 1 tablet by mouth daily. ALIVE men's vitamin    . NITROSTAT 0.4 MG SL tablet place 1 tablet under the tongue if needed every 5 minutes for chest pain CALL 911 AFTER 3 DOSES 25 tablet 10  . pantoprazole (PROTONIX) 40 MG tablet Take 40 mg by mouth daily.  0  . spironolactone (ALDACTONE) 25 MG tablet Take 0.5 tablets (12.5 mg total) by mouth daily. 15 tablet 3  . triamcinolone cream (KENALOG) 0.1 % Apply 1 application topically 2 (two) times daily as needed (skin).   0   No current facility-administered medications for this visit.     Past Medical History  Diagnosis Date  . Palpitation   . AAA (abdominal aortic aneurysm) ([place])   . Arthritis   . Hiatal hernia   . Dizziness     takes Antivert daily as needed  . ED (erectile dysfunction)   . Sigmoid diverticulosis   . Thyroid disease   . T12 compression fracture ([place])   . Allergic rhinitis     uses Flonase daily  . Chronic venous insufficiency   . Cancer ([address])     skin  . Carotid  artery occlusion   . Abnormal stress test   . Hypertension     takes Proscar,Dyazide,Avapro,and Metoprolol daily  . GERD (gastroesophageal reflux disease)     takes Omeprazole daily  . Coronary artery disease   . Anginal pain ([facility])     pain in neck,shoulders,down arms occ  . Shortness of breath   . Hx of echocardiogram 04/04/2010    showed a borderline dilatd left ventricle with mild left ventricle hypertrophy and an EF 50-55% doppler suggested diastolic dysfunction, although the pattern is probably normal for an 82 yrs old. The left atrium was indeed mildly dilated at 42 mm, but there are no significant valvular abnormalities.   . Hyperlipidemia   . Myocardial infarction [facility]) Oct. 11, 2016    Heart Attack    ROS:   All systems reviewed and negative except as noted in the HPI.   Past Surgical History  Procedure Laterality Date  . Rotator cuff repair  2003    left arm  . Abdominal aortic aneurysm repair   02/24/2009    AAA Stenting  . Hernia repair Bilateral   . Hand surgery Right     tendon, lft 90's  . Endarterectomy Right 10/27/2013    Procedure: ENDARTERECTOMY CAROTID;  Surgeon: Elam Dutch, MD;  Location: [place];  Service: Vascular;  Laterality: Right;  . Melanoma removed    . Left heart catheterization with coronary angiogram N/A 10/12/2013    Procedure: LEFT HEART CATHETERIZATION WITH CORONARY ANGIOGRAM;  Surgeon: Lorretta Harp, MD;  Location[facility] CATH LAB;  Service: Cardiovascular;  Laterality: N/A;  . Cardiac catheterization N/A 08/04/2015    Procedure: Right/Left Heart Cath and Coronary Angiography;  Surgeon: Larey Dresser, MD;  Location: [place] CV LAB;  Service: Cardiovascular;  Laterality: N/A;  . Cardiac catheterization N/A 08/04/2015    Procedure: Coronary Stent Intervention;  Surgeon: Burnell Blanks, MD;  Location: [place] CV LAB;  Service: Cardiovascular;  Laterality: N/A;  . Cardiac catheterization N/A 08/08/2015    Procedure: Coronary Stent Intervention;  Surgeon: Peter M Martinique, MD;  Location: [place] CV LAB;  Service: Cardiovascular;  Laterality: N/A;     Family History  Problem Relation Age of Onset  . Heart disease Mother     before age 63  . COPD Mother   . Diabetes Mother   . Hyperlipidemia Mother   . Hypertension Mother      Social History   Social History  . Marital Status: Widowed    Spouse Name: N/A  . Number of Children: N/A  . Years of Education: N/A   Occupational History  . Not on file.   Social History Main Topics  . Smoking status: Former Smoker -- 2.00 packs/day for 30 years    Types: Cigarettes    Quit date: 10/22/1977  . Smokeless tobacco: Former Systems developer    Quit date: 10/22/1984  . Alcohol Use: 4.2 oz/week    7 Cans of beer per week  . Drug Use: No  . Sexual Activity: Not on file   Other Topics Concern  . Not on file   Social History Narrative     BP 130/46 mmHg  Pulse 56  Ht 5' 10.5" (1.791 m)   Wt 172 lb (78.019 kg)  BMI 24.32 kg/m2  Physical Exam:  Well appearing elderly man, NAD HEENT: Unremarkable Neck:  No JVD, no thyromegally Lymphatics:  No adenopathy Back:  No CVA tenderness Lungs:  Clear with no wheezes HEART:  Regular  rate rhythm, no murmurs, no rubs, no clicks, split S2. Abd:  soft, positive bowel sounds, no organomegally, no rebound, no guarding Ext:  2 plus pulses, no edema, no cyanosis, no clubbing Skin:  No rashes no nodules Neuro:  CN II through XII intact, motor grossly intact  EKG - NSR with LBBB and PVC's    Assess/Plan: 1. Chronic systolic heart failure - his symptoms are class 2-3. He has severe LV dysfunction. I have offered him BiV PPM insertion and he wishes to proceed. 2. CAD - he denies anginal symptoms though he is s/p MI. 3. HTN - his blood pressure is stable. Will follow.  Mikle Bosworth.D.

## 2016-01-03 NOTE — Patient Instructions (Signed)
Medication Instructions:  Your physician recommends that you continue on your current medications as directed. Please refer to the Current Medication list given to you today.   Labwork: None ordered   Testing/Procedures: Your physician has recommended that you have a pacemaker inserted. A pacemaker is a small device that is placed under the skin of your chest or abdomen to help control abnormal heart rhythms. This device uses electrical pulses to prompt the heart to beat at a normal rate. Pacemakers are used to treat heart rhythms that are too slow. Wire (leads) are attached to the pacemaker that goes into the chambers of you heart. This is done in the hospital and usually requires and overnight stay. Please see the instruction sheet given to you today for more information.(Bi-V PPM SJ)  Dates for procedure: 3/28,4/3,4/6,4/11,4/17,4/20,5/1,5/5,5/10,5/11,5/16,5/18,5/22,5/25,5/30  Call back and leave message for Janan Halter, RN  [phone]    Follow-Up: Your physician recommends that you schedule a follow-up appointment after device placement   Any Other Special Instructions Will Be Listed Below (If Applicable).     If you need a refill on your cardiac medications before your next appointment, please call your pharmacy.

## 2016-01-04 ENCOUNTER — Encounter (HOSPITAL_COMMUNITY)
Admission: RE | Admit: 2016-01-04 | Discharge: 2016-01-04 | Disposition: A | Discharge status: Home / Self Care | Payer: Medicare Other | Source: Ambulatory Visit | Attending: Cardiology | Admitting: Cardiology

## 2016-01-04 ENCOUNTER — Encounter: Payer: Self-pay | Admitting: Cardiology

## 2016-01-04 DIAGNOSIS — I214 Non-ST elevation (NSTEMI) myocardial infarction: Secondary | ICD-10-CM | POA: Diagnosis not present

## 2016-01-06 ENCOUNTER — Encounter (HOSPITAL_COMMUNITY)
Admission: RE | Admit: 2016-01-06 | Discharge: 2016-01-06 | Disposition: A | Discharge status: Home / Self Care | Payer: Medicare Other | Source: Ambulatory Visit | Attending: Cardiology | Admitting: Cardiology

## 2016-01-06 ENCOUNTER — Ambulatory Visit (HOSPITAL_COMMUNITY)
Admission: RE | Admit: 2016-01-06 | Discharge: 2016-01-06 | Disposition: A | Discharge status: Home / Self Care | Payer: Medicare Other | Source: Ambulatory Visit | Attending: Cardiology | Admitting: Cardiology

## 2016-01-06 DIAGNOSIS — I5022 Chronic systolic (congestive) heart failure: Secondary | ICD-10-CM | POA: Insufficient documentation

## 2016-01-06 DIAGNOSIS — Z955 Presence of coronary angioplasty implant and graft: Secondary | ICD-10-CM | POA: Diagnosis not present

## 2016-01-06 LAB — BASIC METABOLIC PANEL
ANION GAP: 8 (ref 5–15)
BUN: 23 mg/dL — ABNORMAL HIGH (ref 6–20)
CALCIUM: 8.9 mg/dL (ref 8.9–10.3)
CO2: 25 mmol/L (ref 22–32)
Chloride: 102 mmol/L (ref 101–111)
Creatinine, Ser: 1.19 mg/dL (ref 0.61–1.24)
GFR, EST NON AFRICAN AMERICAN: 53 mL/min — AB (ref >60)
Glucose, Bld: 100 mg/dL — ABNORMAL HIGH (ref 65–99)
POTASSIUM: 4.9 mmol/L (ref 3.5–5.1)
Sodium: 135 mmol/L (ref 135–145)

## 2016-01-06 LAB — BRAIN NATRIURETIC PEPTIDE: B NATRIURETIC PEPTIDE 5: 277.3 pg/mL — AB (ref 0.0–100.0)

## 2016-01-09 ENCOUNTER — Encounter (HOSPITAL_COMMUNITY)
Admission: RE | Admit: 2016-01-09 | Discharge: 2016-01-09 | Disposition: A | Discharge status: Home / Self Care | Payer: Medicare Other | Source: Ambulatory Visit | Attending: Cardiology | Admitting: Cardiology

## 2016-01-09 DIAGNOSIS — I214 Non-ST elevation (NSTEMI) myocardial infarction: Secondary | ICD-10-CM | POA: Diagnosis not present

## 2016-01-11 ENCOUNTER — Encounter (HOSPITAL_COMMUNITY): Payer: Medicare Other

## 2016-01-12 ENCOUNTER — Ambulatory Visit (INDEPENDENT_AMBULATORY_CARE_PROVIDER_SITE_OTHER): Payer: Medicare Other | Admitting: Podiatry

## 2016-01-12 ENCOUNTER — Encounter: Payer: Self-pay | Admitting: Podiatry

## 2016-01-12 DIAGNOSIS — L84 Corns and callosities: Secondary | ICD-10-CM | POA: Diagnosis not present

## 2016-01-12 DIAGNOSIS — B351 Tinea unguium: Secondary | ICD-10-CM | POA: Diagnosis not present

## 2016-01-12 DIAGNOSIS — M79676 Pain in unspecified toe(s): Secondary | ICD-10-CM | POA: Diagnosis not present

## 2016-01-12 NOTE — Progress Notes (Signed)
Patient ID: JOBANI ARABIE, male   DOB: 21-Sep-1927, 82 y.o.   MRN: [id number] Complaint:  Visit Type: Patient returns to my office for continued preventative foot care services. Complaint: Patient states" my nails have grown long and thick and become painful to walk and wear shoes"  He presents for preventative foot care services. No changes to ROS  Podiatric Exam: Vascular: dorsalis pedis and posterior tibial pulses are palpable bilateral. Capillary return is immediate. Temperature gradient is WNL. Skin turgor WNL  Sensorium: Normal Semmes Weinstein monofilament test. Normal tactile sensation bilaterally. Nail Exam: Pt has thick disfigured discolored nails with subungual debris noted bilateral entire nail hallux through fifth toenails Ulcer Exam: There is no evidence of ulcer or pre-ulcerative changes or infection. Orthopedic Exam: Muscle tone and strength are WNL. No limitations in general ROM. No crepitus or effusions noted. Foot type and digits show no abnormalities. Bony prominences are unremarkable. Skin: No Porokeratosis. No infection or ulcers. Distal Clavi second toe right.  Diagnosis:  Tinea unguium, Pain in right toe, pain in left toes, Second toe clavi right  Treatment & Plan Procedures and Treatment: Consent by patient was obtained for treatment procedures. The patient understood the discussion of treatment and procedures well. All questions were answered thoroughly reviewed. Debridement of mycotic and hypertrophic toenails, 1 through 5 bilateral and clearing of subungual debris. No ulceration, no infection noted. Debride clavi. Return Visit-Office Procedure: Patient instructed to return to the office for a follow up visit 3 months for continued evaluation and treatment.   Gardiner Barefoot DPM

## 2016-01-13 ENCOUNTER — Encounter (HOSPITAL_COMMUNITY)
Admission: RE | Admit: 2016-01-13 | Discharge: 2016-01-13 | Disposition: A | Discharge status: Home / Self Care | Payer: Medicare Other | Source: Ambulatory Visit | Attending: Cardiology | Admitting: Cardiology

## 2016-01-13 DIAGNOSIS — I214 Non-ST elevation (NSTEMI) myocardial infarction: Secondary | ICD-10-CM | POA: Diagnosis not present

## 2016-01-16 ENCOUNTER — Telehealth (HOSPITAL_COMMUNITY): Payer: Self-pay | Admitting: Cardiac Rehabilitation

## 2016-01-16 ENCOUNTER — Encounter (HOSPITAL_COMMUNITY): Payer: Medicare Other

## 2016-01-16 NOTE — Telephone Encounter (Signed)
pc to pt to assess reason for absence from cardiac rehab. Pt states he did not feel well this morning. Pt is anticipating BIV PPM implant within next 2-4 weeks. Pt will plan to exit from cardiac rehab prior to his implant and hopefully return to cardiac maintenance program afterwards. Pt plans to return to rehab on 01/18/16.  Pt verbalized understanding.

## 2016-01-18 ENCOUNTER — Encounter (HOSPITAL_COMMUNITY)
Admission: RE | Admit: 2016-01-18 | Discharge: 2016-01-18 | Disposition: A | Discharge status: Home / Self Care | Payer: Medicare Other | Source: Ambulatory Visit | Attending: Cardiology | Admitting: Cardiology

## 2016-01-18 ENCOUNTER — Telehealth: Payer: Self-pay | Admitting: Internal Medicine

## 2016-01-18 DIAGNOSIS — I214 Non-ST elevation (NSTEMI) myocardial infarction: Secondary | ICD-10-CM | POA: Diagnosis not present

## 2016-01-18 NOTE — Telephone Encounter (Signed)
New Message  Pt called req a call back to schedule the procedure

## 2016-01-19 NOTE — Telephone Encounter (Signed)
Spoke with patient and scheduled him for a Bi V PPM implant on 01/26/16 at 9:30am He will arrive at the [facility] at 7:30am NPO after midnight Last dose of Plavix Mon 01/23/16 per Dr Lovena Le Pre Cert sent  Labs at the hospital

## 2016-01-19 NOTE — Telephone Encounter (Signed)
Spoke with patient, he is scheduled for 01/26/16

## 2016-01-20 ENCOUNTER — Encounter (HOSPITAL_COMMUNITY)
Admission: RE | Admit: 2016-01-20 | Discharge: 2016-01-20 | Disposition: A | Discharge status: Home / Self Care | Payer: Medicare Other | Source: Ambulatory Visit | Attending: Cardiology | Admitting: Cardiology

## 2016-01-20 DIAGNOSIS — I214 Non-ST elevation (NSTEMI) myocardial infarction: Secondary | ICD-10-CM | POA: Diagnosis not present

## 2016-01-23 ENCOUNTER — Encounter (HOSPITAL_COMMUNITY): Payer: Medicare Other

## 2016-01-23 ENCOUNTER — Telehealth: Payer: Self-pay | Admitting: Internal Medicine

## 2016-01-23 NOTE — Telephone Encounter (Signed)
New message   Pt is calling to speak to rn about 01/26/16 surgery

## 2016-01-23 NOTE — Telephone Encounter (Signed)
Patient wanted to know what time surgery is scheduled for 0- informed him 9:30 am. But reminded to arrive to [facility] at 7:30 am - pt is agreeable. He also wanted to know if he should take any medications before procedure.  He knows he isn't to take his Plavix. Advised patient he may take all medications with a sip of water the morning of procedure or he can wait and he will be given after procedure. Patient states he will wait to take after procedure. Thanks me for calling back.

## 2016-01-24 DIAGNOSIS — I10 Essential (primary) hypertension: Secondary | ICD-10-CM | POA: Diagnosis not present

## 2016-01-25 ENCOUNTER — Encounter (HOSPITAL_COMMUNITY): Payer: Self-pay

## 2016-01-25 ENCOUNTER — Encounter (HOSPITAL_COMMUNITY)
Admission: RE | Admit: 2016-01-25 | Discharge: 2016-01-25 | Disposition: A | Discharge status: Home / Self Care | Payer: Medicare Other | Source: Ambulatory Visit | Attending: Cardiology | Admitting: Cardiology

## 2016-01-25 DIAGNOSIS — Z955 Presence of coronary angioplasty implant and graft: Secondary | ICD-10-CM | POA: Insufficient documentation

## 2016-01-25 DIAGNOSIS — I214 Non-ST elevation (NSTEMI) myocardial infarction: Secondary | ICD-10-CM | POA: Insufficient documentation

## 2016-01-25 NOTE — Progress Notes (Signed)
Pt graduated from cardiac rehab program today with completion of 32  exercise sessions in Phase II.  Pt is exiting program early due to scheduled BIV PPM implant.   Pt maintained average attendance with exception of medical absences. Pt  progressed nicely during his participation in rehab as evidenced by increased MET level.    Medication list reconciled. Repeat  PHQ score-0  .  Pt should be commended for his success. Pt feels he has achieved his goals during cardiac rehab which includes increased strength.     Pt plans to continue exercise in cardiac maintenance program.

## 2016-01-26 ENCOUNTER — Encounter (HOSPITAL_COMMUNITY)
Admission: RE | Disposition: A | Discharge status: Home / Self Care | Payer: Self-pay | Source: Ambulatory Visit | Attending: Internal Medicine

## 2016-01-26 ENCOUNTER — Encounter (HOSPITAL_COMMUNITY): Payer: Self-pay | Admitting: Internal Medicine

## 2016-01-26 ENCOUNTER — Ambulatory Visit (HOSPITAL_COMMUNITY)
Admission: RE | Admit: 2016-01-26 | Discharge: 2016-01-27 | Disposition: A | Discharge status: Home / Self Care | Payer: Medicare Other | Source: Ambulatory Visit | Attending: Internal Medicine | Admitting: Internal Medicine

## 2016-01-26 DIAGNOSIS — E785 Hyperlipidemia, unspecified: Secondary | ICD-10-CM | POA: Diagnosis not present

## 2016-01-26 DIAGNOSIS — I130 Hypertensive heart and chronic kidney disease with heart failure and stage 1 through stage 4 chronic kidney disease, or unspecified chronic kidney disease: Secondary | ICD-10-CM | POA: Diagnosis not present

## 2016-01-26 DIAGNOSIS — N189 Chronic kidney disease, unspecified: Secondary | ICD-10-CM | POA: Insufficient documentation

## 2016-01-26 DIAGNOSIS — K219 Gastro-esophageal reflux disease without esophagitis: Secondary | ICD-10-CM | POA: Diagnosis not present

## 2016-01-26 DIAGNOSIS — I447 Left bundle-branch block, unspecified: Secondary | ICD-10-CM | POA: Diagnosis present

## 2016-01-26 DIAGNOSIS — I255 Ischemic cardiomyopathy: Secondary | ICD-10-CM | POA: Insufficient documentation

## 2016-01-26 DIAGNOSIS — I6529 Occlusion and stenosis of unspecified carotid artery: Secondary | ICD-10-CM | POA: Insufficient documentation

## 2016-01-26 DIAGNOSIS — Z87891 Personal history of nicotine dependence: Secondary | ICD-10-CM | POA: Diagnosis not present

## 2016-01-26 DIAGNOSIS — Z7982 Long term (current) use of aspirin: Secondary | ICD-10-CM | POA: Insufficient documentation

## 2016-01-26 DIAGNOSIS — I5022 Chronic systolic (congestive) heart failure: Secondary | ICD-10-CM | POA: Diagnosis not present

## 2016-01-26 DIAGNOSIS — Z950 Presence of cardiac pacemaker: Secondary | ICD-10-CM

## 2016-01-26 DIAGNOSIS — I2510 Atherosclerotic heart disease of native coronary artery without angina pectoris: Secondary | ICD-10-CM | POA: Insufficient documentation

## 2016-01-26 DIAGNOSIS — I252 Old myocardial infarction: Secondary | ICD-10-CM | POA: Diagnosis not present

## 2016-01-26 DIAGNOSIS — Z7902 Long term (current) use of antithrombotics/antiplatelets: Secondary | ICD-10-CM | POA: Diagnosis not present

## 2016-01-26 DIAGNOSIS — Z79899 Other long term (current) drug therapy: Secondary | ICD-10-CM | POA: Diagnosis not present

## 2016-01-26 HISTORY — PX: EP IMPLANTABLE DEVICE: SHX172B

## 2016-01-26 LAB — BASIC METABOLIC PANEL
Anion gap: 12 (ref 5–15)
BUN: 29 mg/dL — AB (ref 6–20)
CALCIUM: 9.4 mg/dL (ref 8.9–10.3)
CHLORIDE: 101 mmol/L (ref 101–111)
CO2: 24 mmol/L (ref 22–32)
CREATININE: 1.03 mg/dL (ref 0.61–1.24)
GFR calc non Af Amer: >60 mL/min (ref >60)
GLUCOSE: 102 mg/dL — AB (ref 65–99)
Potassium: 3.9 mmol/L (ref 3.5–5.1)
Sodium: 137 mmol/L (ref 135–145)

## 2016-01-26 LAB — CBC
HEMATOCRIT: 32.6 % — AB (ref 39.0–52.0)
HEMOGLOBIN: 10.7 g/dL — AB (ref 13.0–17.0)
MCH: 27.6 pg (ref 26.0–34.0)
MCHC: 32.8 g/dL (ref 30.0–36.0)
MCV: 84.2 fL (ref 78.0–100.0)
Platelets: 147 10*3/uL — ABNORMAL LOW (ref 150–400)
RBC: 3.87 MIL/uL — ABNORMAL LOW (ref 4.22–5.81)
RDW: 15.7 % — AB (ref 11.5–15.5)
WBC: 6.1 10*3/uL (ref 4.0–10.5)

## 2016-01-26 LAB — SURGICAL PCR SCREEN
MRSA, PCR: NEGATIVE
STAPHYLOCOCCUS AUREUS: NEGATIVE

## 2016-01-26 SURGERY — BIV PACEMAKER INSERTION CRT-P

## 2016-01-26 MED ORDER — FENTANYL CITRATE (PF) 100 MCG/2ML IJ SOLN
INTRAMUSCULAR | Status: AC
  Filled 2016-01-26: qty 2

## 2016-01-26 MED ORDER — CEFAZOLIN SODIUM 1-5 GM-% IV SOLN
1.0000 g | Freq: Four times a day (QID) | INTRAVENOUS | Status: AC
  Administered 2016-01-26 – 2016-01-27 (×3): 1 g via INTRAVENOUS
  Filled 2016-01-26 (×4): qty 50

## 2016-01-26 MED ORDER — MIDAZOLAM HCL 5 MG/5ML IJ SOLN
INTRAMUSCULAR | Status: AC
  Filled 2016-01-26: qty 5

## 2016-01-26 MED ORDER — IOHEXOL 350 MG/ML SOLN
INTRAVENOUS | Status: DC | PRN
  Administered 2016-01-26: 20 mL via INTRAVENOUS

## 2016-01-26 MED ORDER — HEPARIN (PORCINE) IN NACL 2-0.9 UNIT/ML-% IJ SOLN
INTRAMUSCULAR | Status: DC | PRN
  Administered 2016-01-26: 12:00:00

## 2016-01-26 MED ORDER — SODIUM CHLORIDE 0.9 % IV SOLN
INTRAVENOUS | Status: DC | PRN
  Administered 2016-01-26: 50 mL/h via INTRAVENOUS

## 2016-01-26 MED ORDER — ACETAMINOPHEN 500 MG PO TABS
1000.0000 mg | ORAL_TABLET | Freq: Every day | ORAL | Status: DC | PRN
  Administered 2016-01-26: 1000 mg via ORAL
  Filled 2016-01-26: qty 2

## 2016-01-26 MED ORDER — FINASTERIDE 5 MG PO TABS
5.0000 mg | ORAL_TABLET | Freq: Every day | ORAL | Status: DC
  Administered 2016-01-26 – 2016-01-27 (×2): 5 mg via ORAL
  Filled 2016-01-26 (×2): qty 1

## 2016-01-26 MED ORDER — ACETAMINOPHEN 325 MG PO TABS
325.0000 mg | ORAL_TABLET | ORAL | Status: DC | PRN
  Administered 2016-01-27: 650 mg via ORAL
  Filled 2016-01-26: qty 2

## 2016-01-26 MED ORDER — GENTAMICIN SULFATE 40 MG/ML IJ SOLN
80.0000 mg | INTRAMUSCULAR | Status: AC
  Administered 2016-01-26: 80 mg
  Filled 2016-01-26: qty 2

## 2016-01-26 MED ORDER — CHLORHEXIDINE GLUCONATE 4 % EX LIQD
60.0000 mL | Freq: Once | CUTANEOUS | Status: DC
Start: 2016-01-26 — End: 2016-01-26

## 2016-01-26 MED ORDER — HEPARIN (PORCINE) IN NACL 2-0.9 UNIT/ML-% IJ SOLN
INTRAMUSCULAR | Status: AC
  Filled 2016-01-26: qty 500

## 2016-01-26 MED ORDER — LIDOCAINE HCL (PF) 1 % IJ SOLN
INTRAMUSCULAR | Status: AC
  Filled 2016-01-26: qty 30

## 2016-01-26 MED ORDER — LIDOCAINE HCL (PF) 1 % IJ SOLN
INTRAMUSCULAR | Status: DC | PRN
  Administered 2016-01-26: 46 mL via SUBCUTANEOUS

## 2016-01-26 MED ORDER — LISINOPRIL 5 MG PO TABS
5.0000 mg | ORAL_TABLET | Freq: Every day | ORAL | Status: DC
  Administered 2016-01-26 – 2016-01-27 (×2): 5 mg via ORAL
  Filled 2016-01-26 (×2): qty 1

## 2016-01-26 MED ORDER — CARVEDILOL 6.25 MG PO TABS
6.2500 mg | ORAL_TABLET | Freq: Two times a day (BID) | ORAL | Status: DC
  Administered 2016-01-26 – 2016-01-27 (×2): 6.25 mg via ORAL
  Filled 2016-01-26 (×2): qty 1

## 2016-01-26 MED ORDER — MUPIROCIN 2 % EX OINT
TOPICAL_OINTMENT | CUTANEOUS | Status: AC
  Administered 2016-01-26: 1 via TOPICAL
  Filled 2016-01-26: qty 22

## 2016-01-26 MED ORDER — SPIRONOLACTONE 25 MG PO TABS
12.5000 mg | ORAL_TABLET | Freq: Every day | ORAL | Status: DC
  Administered 2016-01-26 – 2016-01-27 (×2): 12.5 mg via ORAL
  Filled 2016-01-26 (×2): qty 1

## 2016-01-26 MED ORDER — MIDAZOLAM HCL 5 MG/5ML IJ SOLN
INTRAMUSCULAR | Status: DC | PRN
  Administered 2016-01-26 (×6): 1 mg via INTRAVENOUS

## 2016-01-26 MED ORDER — MUPIROCIN 2 % EX OINT
1.0000 "application " | TOPICAL_OINTMENT | Freq: Once | CUTANEOUS | Status: AC
  Administered 2016-01-26: 1 via TOPICAL

## 2016-01-26 MED ORDER — ATORVASTATIN CALCIUM 40 MG PO TABS
40.0000 mg | ORAL_TABLET | Freq: Every day | ORAL | Status: DC
  Administered 2016-01-26: 40 mg via ORAL
  Filled 2016-01-26: qty 1

## 2016-01-26 MED ORDER — CEFAZOLIN SODIUM-DEXTROSE 2-3 GM-% IV SOLR
INTRAVENOUS | Status: AC
  Filled 2016-01-26: qty 50

## 2016-01-26 MED ORDER — PANTOPRAZOLE SODIUM 40 MG PO TBEC
40.0000 mg | DELAYED_RELEASE_TABLET | Freq: Every day | ORAL | Status: DC
  Administered 2016-01-26 – 2016-01-27 (×2): 40 mg via ORAL
  Filled 2016-01-26 (×2): qty 1

## 2016-01-26 MED ORDER — ONDANSETRON HCL 4 MG/2ML IJ SOLN: 4.0000 mg | Freq: Four times a day (QID) | INTRAMUSCULAR | Status: DC | PRN

## 2016-01-26 MED ORDER — CEFAZOLIN SODIUM-DEXTROSE 2-3 GM-% IV SOLR
INTRAVENOUS | Status: DC | PRN
  Administered 2016-01-26: 2 g via INTRAVENOUS

## 2016-01-26 MED ORDER — MECLIZINE HCL 25 MG PO TABS: 25.0000 mg | ORAL_TABLET | Freq: Two times a day (BID) | ORAL | Status: DC | PRN

## 2016-01-26 MED ORDER — SODIUM CHLORIDE 0.9 % IV SOLN
INTRAVENOUS | Status: DC
  Administered 2016-01-26: 08:00:00 via INTRAVENOUS

## 2016-01-26 MED ORDER — FENTANYL CITRATE (PF) 100 MCG/2ML IJ SOLN
INTRAMUSCULAR | Status: DC | PRN
  Administered 2016-01-26 (×6): 12.5 ug via INTRAVENOUS

## 2016-01-26 MED ORDER — GABAPENTIN 300 MG PO CAPS
300.0000 mg | ORAL_CAPSULE | Freq: Two times a day (BID) | ORAL | Status: DC
  Administered 2016-01-26 – 2016-01-27 (×3): 300 mg via ORAL
  Filled 2016-01-26 (×3): qty 1

## 2016-01-26 MED ORDER — CEFAZOLIN SODIUM-DEXTROSE 2-4 GM/100ML-% IV SOLN
2.0000 g | INTRAVENOUS | Status: DC
  Filled 2016-01-26: qty 100

## 2016-01-26 MED ORDER — FLUTICASONE PROPIONATE 50 MCG/ACT NA SUSP: 1.0000 | Freq: Every day | NASAL | Status: DC | PRN

## 2016-01-26 MED ORDER — ASPIRIN EC 81 MG PO TBEC
81.0000 mg | DELAYED_RELEASE_TABLET | Freq: Every day | ORAL | Status: DC
  Administered 2016-01-26 – 2016-01-27 (×2): 81 mg via ORAL
  Filled 2016-01-26 (×2): qty 1

## 2016-01-26 MED ORDER — SODIUM CHLORIDE 0.9 % IR SOLN
Status: AC
  Filled 2016-01-26: qty 2

## 2016-01-26 SURGICAL SUPPLY — 18 items
ALLURE CRT PM3262 (Pacemaker) ×2 IMPLANT
CABLE SURGICAL S-101-97-12 (CABLE) ×1 IMPLANT
CATH CPS DIRECT 135 DS2C020 (CATHETERS) ×1 IMPLANT
CATH CPS QUART SUB DS2N026-59 (CATHETERS) ×1 IMPLANT
CATH HEX JOS 2-5-2 65CM 6F REP (CATHETERS) ×1 IMPLANT
CPS IMPLANT KIT 410190 (MISCELLANEOUS) ×1 IMPLANT
LEAD QUARTET 1458QL-86 (Lead) IMPLANT
LEAD TENDRIL MRI 52CM LPA1200M (Lead) ×1 IMPLANT
LEAD TENDRIL MRI 58CM LPA1200M (Lead) ×1 IMPLANT
PACEMAKER ALLURE CRT (Pacemaker) IMPLANT
PAD DEFIB LIFELINK (PAD) ×1 IMPLANT
QUARTET 1458QL-86 (Lead) ×2 IMPLANT
SHEATH CLASSIC 8F (SHEATH) ×2 IMPLANT
SLITTER UNIVERSAL DS2A003 (MISCELLANEOUS) ×1 IMPLANT
TRAY PACEMAKER INSERTION (PACKS) ×1 IMPLANT
WIRE ASAHI SOFT 180CM (WIRE) ×1 IMPLANT
WIRE LUGE 182CM (WIRE) ×1 IMPLANT
WIRE MAILMAN 182CM (WIRE) ×1 IMPLANT

## 2016-01-26 NOTE — Discharge Summary (Signed)
ELECTROPHYSIOLOGY PROCEDURE DISCHARGE SUMMARY    Patient ID: JEM EASTBURN,  MRN: [id number], DOB/AGE: [birth year] 82 y.o.  Admit date: 01/26/2016 Discharge date: 01/27/2016  Primary Care Physician: Merrilee Seashore, MD Primary Cardiologist: Aundra Dubin Electrophysiologist: Lovena Le  Primary Discharge Diagnosis:  LV dysfunction, LBBB, and chronic systolic heart failure status post CRTP implant this admission  Secondary Discharge Diagnosis:  1.  CKD 2.  Carotid stenosis 3.  CAD 4.  AAA s/p repair  Allergies  Allergen Reactions  . Sinequan [Doxepin Hcl] Other (See Comments)    hallucinations  . Verapamil Other (See Comments)    Caused heart to race  . Procaine Hcl Other (See Comments)    Tingling all over as soon as it was injected     Procedures This Admission:  1.  Implantation of a STJ CRTP on 01/26/16 by Dr Lovena Le.  See op note for full details. There were no immediate post procedure complications. 2.  CXR on 01/27/16 demonstrated no pneumothorax status post device implantation.   Brief HPI: Henry Neal is a 82 y.o. male was referred to electrophysiology in the outpatient setting for consideration of CRTP implantation.  Past medical history includes chronic systolic heart failure, LBBB.  Risks, benefits, and alternatives to CRTP implantation were reviewed with the patient who wished to proceed.   Hospital Course:  The patient was admitted and underwent implantation of a STJ CRTP with details as outlined above.  He  was monitored on telemetry overnight which demonstrated sinus rhythm with ventricular pacing.  Left chest was without hematoma or ecchymosis.  The device was interrogated and found to be functioning normally.  CXR was obtained and demonstrated no pneumothorax status post device implantation.  Wound care, arm mobility, and restrictions were reviewed with the patient.  The patient was examined and considered stable for discharge to home.    Physical Exam: Filed  Vitals:   01/26/16 1800 01/26/16 1900 01/26/16 2000 01/27/16 0500  BP: 130/54 105/44 111/48 116/54  Pulse: 56 52 57 50  Temp:   98.1 F (36.7 C) 98.3 F (36.8 C)  TempSrc:   Oral Oral  Resp: 14 11 12 16   Height:      Weight:    167 lb 14.4 oz (76.159 kg)  SpO2: 99% 96% 97% 99%    GEN- The patient is well appearing, alert and oriented x 3 today.   HEENT: normocephalic, atraumatic; sclera clear, conjunctiva pink; hearing intact; oropharynx clear; neck supple  Lungs- Clear to ausculation bilaterally, normal work of breathing.  No wheezes, rales, rhonchi Heart- Regular rate and rhythm  GI- soft, non-tender, non-distended, bowel sounds present  Extremities- no clubbing, cyanosis, or edema; DP/PT/radial pulses 2+ bilaterally MS- no significant deformity or atrophy Skin- warm and dry, no rash or lesion, left chest without hematoma/ecchymosis Psych- euthymic mood, full affect Neuro- strength and sensation are intact   Labs:   Lab Results  Component Value Date   WBC 6.1 01/26/2016   HGB 10.7* 01/26/2016   HCT 32.6* 01/26/2016   MCV 84.2 01/26/2016   PLT 147* 01/26/2016     Recent Labs Lab 01/26/16 0823  NA 137  K 3.9  CL 101  CO2 24  BUN 29*  CREATININE 1.03  CALCIUM 9.4  GLUCOSE 102*    Discharge Medications:    Medication List    TAKE these medications        acetaminophen 500 MG tablet  Commonly known as:  TYLENOL  Take 1,000 mg by mouth  daily as needed for mild pain or headache. Reported on 01/25/2016     aspirin EC 81 MG tablet  Take 81 mg by mouth daily.     atorvastatin 40 MG tablet  Commonly known as:  LIPITOR  Take 1 tablet (40 mg total) by mouth daily at 6 PM.     CALCIUM 600 + D PO  Take 1 tablet by mouth 2 (two) times daily.     carvedilol 6.25 MG tablet  Commonly known as:  COREG  Take 1 tablet (6.25 mg total) by mouth 2 (two) times daily.     clopidogrel 75 MG tablet  Commonly known as:  PLAVIX  take 1 tablet by mouth once daily WITH  BREAKFAST     finasteride 5 MG tablet  Commonly known as:  PROSCAR  take 1 tablet by mouth once daily     fluticasone 50 MCG/ACT nasal spray  Commonly known as:  FLONASE  Place 1 spray into both nostrils daily as needed for allergies.     gabapentin 300 MG capsule  Commonly known as:  NEURONTIN  Take 300 mg by mouth 2 (two) times daily.     ketoconazole 2 % cream  Commonly known as:  NIZORAL  apply to affected area ON FACE ONCE DAILY prn skin     lisinopril 5 MG tablet  Commonly known as:  PRINIVIL,ZESTRIL  Take 5 mg by mouth daily.     meclizine 25 MG tablet  Commonly known as:  ANTIVERT  Take 25 mg by mouth 2 (two) times daily as needed for dizziness.     mometasone 0.1 % cream  Commonly known as:  ELOCON  Apply 1 application topically daily as needed (for irritation).     MULTIVITAMIN PO  Take 1 tablet by mouth daily. ALIVE men's vitamin     NITROSTAT 0.4 MG SL tablet  Generic drug:  nitroGLYCERIN  place 1 tablet under the tongue if needed every 5 minutes for chest pain CALL 911 AFTER 3 DOSES     pantoprazole 40 MG tablet  Commonly known as:  PROTONIX  Take 40 mg by mouth daily.     spironolactone 25 MG tablet  Commonly known as:  ALDACTONE  Take 0.5 tablets (12.5 mg total) by mouth daily.     SYSTANE BALANCE OP  Place 1 drop into both eyes daily as needed (for dry eyes).     triamcinolone cream 0.1 %  Commonly known as:  KENALOG  Apply 1 application topically 2 (two) times daily as needed (skin).        Disposition:  Discharge Instructions    Diet - low sodium heart healthy    Complete by:  As directed      Increase activity slowly    Complete by:  As directed           Follow-up Information    Follow up with [facility] On 02/06/2016.   Specialty:  Cardiology   Why:  at 9:30AM for wound check    Contact information:   [address] [place] [phone]      Duration of  Discharge Encounter: Greater than 30 minutes including physician time.  Signed, Chanetta Marshall, NP 01/27/2016 7:18 AM   EP Attending  Patient seen and examined. Agree with the findings as noted above. His BiV PPM is working normally. Will plan DC home with usual followup.  Mikle Bosworth.D.

## 2016-01-26 NOTE — Progress Notes (Signed)
Orthopedic Tech Progress Note Patient Details:  Henry Neal 08/11/1927 [id number]  Ortho Devices Type of Ortho Device: Arm sling Ortho Device/Splint Interventions: Application   [place] Pink 01/26/2016, 12:49 PM

## 2016-01-26 NOTE — Interval H&P Note (Signed)
History and Physical Interval Note:  01/26/2016 12:52 PM  Henry Neal  has presented today for surgery, with the diagnosis of chf  The various methods of treatment have been discussed with the patient and family. After consideration of risks, benefits and other options for treatment, the patient has consented to  Procedure(s): BiV Pacemaker Insertion CRT-P (N/A) as a surgical intervention .  The patient's history has been reviewed, patient examined, no change in status, stable for surgery.  I have reviewed the patient's chart and labs.  Questions were answered to the patient's satisfaction.     Cristopher Peru

## 2016-01-26 NOTE — Discharge Instructions (Signed)
° ° °  Supplemental Discharge Instructions for  Pacemaker/Defibrillator Patients  Activity No heavy lifting or vigorous activity with your left/right arm for 6 to 8 weeks.  Do not raise your left/right arm above your head for one week.  Gradually raise your affected arm as drawn below.           __     01/30/16                   01/31/16                      02/01/16                02/02/16   WOUND CARE - Keep the wound area clean and dry.  Do not get this area wet for one week. No showers for one week; you may shower on   02/02/16  . - The tape/steri-strips on your wound will fall off; do not pull them off.  No bandage is needed on the site.  DO  NOT apply any creams, oils, or ointments to the wound area. - If you notice any drainage or discharge from the wound, any swelling or bruising at the site, or you develop a fever > 101? F after you are discharged home, call the office at once.  Special Instructions - You are still able to use cellular telephones; use the ear opposite the side where you have your pacemaker/defibrillator.  Avoid carrying your cellular phone near your device. - When traveling through airports, show security personnel your identification card to avoid being screened in the metal detectors.  Ask the security personnel to use the hand wand. - Avoid arc welding equipment, MRI testing (magnetic resonance imaging), TENS units (transcutaneous nerve stimulators).  Call the office for questions about other devices. - Avoid electrical appliances that are in poor condition or are not properly grounded. - Microwave ovens are safe to be near or to operate.

## 2016-01-26 NOTE — H&P (View-Only) (Signed)
HPI Henry Neal is referred today to consider BiV PPM insertion. He is a pleasant 82 yo man who still works. He is s/p remote MI with occluded LAD and RCA and has severe LV dysfunction. The patient also has LBBB with a QRS duration of 165 ms. EF is 15% by echo.  He has not had syncope. He denies chest pain. He has class 2-3 CHF symptoms. He denies anginal symptoms. Allergies  Allergen Reactions  . Sinequan [Doxepin Hcl] Other (See Comments)    hallucinations  . Verapamil Other (See Comments)    Caused heart to race  . Procaine Hcl Other (See Comments)    Tingling all over as soon as it was injected     Current Outpatient Prescriptions  Medication Sig Dispense Refill  . acetaminophen (TYLENOL) 325 MG tablet Take 650 mg by mouth every 6 (six) hours as needed for mild pain.    Marland Kitchen aspirin EC 81 MG tablet Take 81 mg by mouth daily.      Marland Kitchen atorvastatin (LIPITOR) 40 MG tablet Take 1 tablet (40 mg total) by mouth daily at 6 PM. 30 tablet 6  . Calcium Carbonate-Vitamin D (CALCIUM 600 + D PO) Take 1 tablet by mouth 2 (two) times daily.     . carvedilol (COREG) 6.25 MG tablet Take 1 tablet (6.25 mg total) by mouth 2 (two) times daily. 180 tablet 3  . clopidogrel (PLAVIX) 75 MG tablet take 1 tablet by mouth once daily WITH BREAKFAST 90 tablet 0  . finasteride (PROSCAR) 5 MG tablet take 1 tablet by mouth once daily    . fluticasone (FLONASE) 50 MCG/ACT nasal spray Place 1 spray into both nostrils daily as needed for allergies.     Marland Kitchen gabapentin (NEURONTIN) 300 MG capsule Take 300 mg by mouth 2 (two) times daily.  0  . ketoconazole (NIZORAL) 2 % cream apply to affected area ON FACE ONCE DAILY prn skin  0  . lisinopril (PRINIVIL,ZESTRIL) 5 MG tablet Take 5 mg by mouth daily.  0  . meclizine (ANTIVERT) 25 MG tablet Take 25 mg by mouth 2 (two) times daily as needed for dizziness.     . mometasone (ELOCON) 0.1 % cream Apply 1 application topically daily as needed (for irritation).     . Multiple  Vitamin (MULTIVITAMIN PO) Take 1 tablet by mouth daily. ALIVE men's vitamin    . NITROSTAT 0.4 MG SL tablet place 1 tablet under the tongue if needed every 5 minutes for chest pain CALL 911 AFTER 3 DOSES 25 tablet 10  . pantoprazole (PROTONIX) 40 MG tablet Take 40 mg by mouth daily.  0  . spironolactone (ALDACTONE) 25 MG tablet Take 0.5 tablets (12.5 mg total) by mouth daily. 15 tablet 3  . triamcinolone cream (KENALOG) 0.1 % Apply 1 application topically 2 (two) times daily as needed (skin).   0   No current facility-administered medications for this visit.     Past Medical History  Diagnosis Date  . Palpitation   . AAA (abdominal aortic aneurysm) ([place])   . Arthritis   . Hiatal hernia   . Dizziness     takes Antivert daily as needed  . ED (erectile dysfunction)   . Sigmoid diverticulosis   . Thyroid disease   . T12 compression fracture ([place])   . Allergic rhinitis     uses Flonase daily  . Chronic venous insufficiency   . Cancer ([place])     skin  . Carotid  artery occlusion   . Abnormal stress test   . Hypertension     takes Proscar,Dyazide,Avapro,and Metoprolol daily  . GERD (gastroesophageal reflux disease)     takes Omeprazole daily  . Coronary artery disease   . Anginal pain ([facility])     pain in neck,shoulders,down arms occ  . Shortness of breath   . Hx of echocardiogram 04/04/2010    showed a borderline dilatd left ventricle with mild left ventricle hypertrophy and an EF 50-55% doppler suggested diastolic dysfunction, although the pattern is probably normal for an 82 yrs old. The left atrium was indeed mildly dilated at 42 mm, but there are no significant valvular abnormalities.   . Hyperlipidemia   . Myocardial infarction [facility]) Oct. 11, 2016    Heart Attack    ROS:   All systems reviewed and negative except as noted in the HPI.   Past Surgical History  Procedure Laterality Date  . Rotator cuff repair  2003    left arm  . Abdominal aortic aneurysm repair   02/24/2009    AAA Stenting  . Hernia repair Bilateral   . Hand surgery Right     tendon, lft 90's  . Endarterectomy Right 10/27/2013    Procedure: ENDARTERECTOMY CAROTID;  Surgeon: Elam Dutch, MD;  Location: [place];  Service: Vascular;  Laterality: Right;  . Melanoma removed    . Left heart catheterization with coronary angiogram N/A 10/12/2013    Procedure: LEFT HEART CATHETERIZATION WITH CORONARY ANGIOGRAM;  Surgeon: Lorretta Harp, MD;  Location[facility] CATH LAB;  Service: Cardiovascular;  Laterality: N/A;  . Cardiac catheterization N/A 08/04/2015    Procedure: Right/Left Heart Cath and Coronary Angiography;  Surgeon: Larey Dresser, MD;  Location: [place] CV LAB;  Service: Cardiovascular;  Laterality: N/A;  . Cardiac catheterization N/A 08/04/2015    Procedure: Coronary Stent Intervention;  Surgeon: Burnell Blanks, MD;  Location: [place] CV LAB;  Service: Cardiovascular;  Laterality: N/A;  . Cardiac catheterization N/A 08/08/2015    Procedure: Coronary Stent Intervention;  Surgeon: Peter M Martinique, MD;  Location: [place] CV LAB;  Service: Cardiovascular;  Laterality: N/A;     Family History  Problem Relation Age of Onset  . Heart disease Mother     before age 95  . COPD Mother   . Diabetes Mother   . Hyperlipidemia Mother   . Hypertension Mother      Social History   Social History  . Marital Status: Widowed    Spouse Name: N/A  . Number of Children: N/A  . Years of Education: N/A   Occupational History  . Not on file.   Social History Main Topics  . Smoking status: Former Smoker -- 2.00 packs/day for 30 years    Types: Cigarettes    Quit date: 10/22/1977  . Smokeless tobacco: Former Systems developer    Quit date: 10/22/1984  . Alcohol Use: 4.2 oz/week    7 Cans of beer per week  . Drug Use: No  . Sexual Activity: Not on file   Other Topics Concern  . Not on file   Social History Narrative     BP 130/46 mmHg  Pulse 56  Ht 5' 10.5" (1.791 m)   Wt 172 lb (78.019 kg)  BMI 24.32 kg/m2  Physical Exam:  Well appearing elderly man, NAD HEENT: Unremarkable Neck:  No JVD, no thyromegally Lymphatics:  No adenopathy Back:  No CVA tenderness Lungs:  Clear with no wheezes HEART:  Regular  rate rhythm, no murmurs, no rubs, no clicks, split S2. Abd:  soft, positive bowel sounds, no organomegally, no rebound, no guarding Ext:  2 plus pulses, no edema, no cyanosis, no clubbing Skin:  No rashes no nodules Neuro:  CN II through XII intact, motor grossly intact  EKG - NSR with LBBB and PVC's    Assess/Plan: 1. Chronic systolic heart failure - his symptoms are class 2-3. He has severe LV dysfunction. I have offered him BiV PPM insertion and he wishes to proceed. 2. CAD - he denies anginal symptoms though he is s/p MI. 3. HTN - his blood pressure is stable. Will follow.  Mikle Bosworth.D.

## 2016-01-27 ENCOUNTER — Encounter (HOSPITAL_COMMUNITY): Payer: Medicare Other

## 2016-01-27 ENCOUNTER — Ambulatory Visit (HOSPITAL_COMMUNITY): Payer: Medicare Other

## 2016-01-27 DIAGNOSIS — I130 Hypertensive heart and chronic kidney disease with heart failure and stage 1 through stage 4 chronic kidney disease, or unspecified chronic kidney disease: Secondary | ICD-10-CM | POA: Diagnosis not present

## 2016-01-27 DIAGNOSIS — I447 Left bundle-branch block, unspecified: Secondary | ICD-10-CM

## 2016-01-27 DIAGNOSIS — N189 Chronic kidney disease, unspecified: Secondary | ICD-10-CM | POA: Diagnosis not present

## 2016-01-27 DIAGNOSIS — I255 Ischemic cardiomyopathy: Secondary | ICD-10-CM | POA: Diagnosis not present

## 2016-01-27 DIAGNOSIS — Z45018 Encounter for adjustment and management of other part of cardiac pacemaker: Secondary | ICD-10-CM | POA: Diagnosis not present

## 2016-01-30 ENCOUNTER — Encounter (HOSPITAL_COMMUNITY): Payer: Medicare Other

## 2016-02-01 ENCOUNTER — Encounter (HOSPITAL_COMMUNITY): Payer: Medicare Other

## 2016-02-03 ENCOUNTER — Encounter (HOSPITAL_COMMUNITY): Payer: Medicare Other

## 2016-02-06 ENCOUNTER — Ambulatory Visit (INDEPENDENT_AMBULATORY_CARE_PROVIDER_SITE_OTHER): Payer: Medicare Other | Admitting: *Deleted

## 2016-02-06 ENCOUNTER — Encounter (HOSPITAL_COMMUNITY): Payer: Medicare Other

## 2016-02-06 ENCOUNTER — Encounter: Payer: Self-pay | Admitting: Internal Medicine

## 2016-02-06 DIAGNOSIS — I5022 Chronic systolic (congestive) heart failure: Secondary | ICD-10-CM

## 2016-02-06 DIAGNOSIS — I447 Left bundle-branch block, unspecified: Secondary | ICD-10-CM

## 2016-02-06 NOTE — Progress Notes (Addendum)
Wound check appointment. Steri-strips removed. Wound without redness or edema. Incision edges approximated, stitch removed from medial incision, antibiotic ointment and bandaid applied, incision otherwise well healed. Patient aware to call with signs/symptoms of infection.   Normal device function. RA and RV thresholds, sensing, and impedances consistent with implant measurements; LV threshold slightly increased from implant (1.0V @ 0.69ms) to 2.125V @ 0.54ms, no additional vector testing performed at this time per industry recommendation (based on trended data). Device auto capture programmed on for extra safety margin until 3 month visit. Histogram distribution appropriate for patient and level of activity. BiV pacing 99%. No mode switches or high ventricular rates noted. Patient educated about wound care, arm mobility, lifting restrictions. ROV with GT on 05/03/16.

## 2016-02-07 ENCOUNTER — Encounter: Payer: Self-pay | Admitting: Cardiology

## 2016-02-07 LAB — CUP PACEART INCLINIC DEVICE CHECK
Battery Voltage: 3.04 V
Brady Statistic RA Percent Paced: 2.9 %
Brady Statistic RV Percent Paced: 99 %
Date Time Interrogation Session: 20170417134832
Implantable Lead Implant Date: 20170406
Implantable Lead Location: 753860
Lead Channel Impedance Value: 537.5 Ohm
Lead Channel Pacing Threshold Amplitude: 0.5 V
Lead Channel Pacing Threshold Amplitude: 2.125 V
Lead Channel Pacing Threshold Pulse Width: 0.4 ms
Lead Channel Pacing Threshold Pulse Width: 0.4 ms
Lead Channel Setting Pacing Amplitude: 2 V
Lead Channel Setting Pacing Amplitude: 3.125
Lead Channel Setting Pacing Pulse Width: 0.4 ms
Lead Channel Setting Pacing Pulse Width: 0.4 ms
Lead Channel Setting Sensing Sensitivity: 2 mV
MDC IDC LEAD IMPLANT DT: 20170406
MDC IDC LEAD IMPLANT DT: 20170406
MDC IDC LEAD LOCATION: 753858
MDC IDC LEAD LOCATION: 753859
MDC IDC MSMT BATTERY REMAINING LONGEVITY: 117.6
MDC IDC MSMT LEADCHNL LV IMPEDANCE VALUE: 687.5 Ohm
MDC IDC MSMT LEADCHNL RA PACING THRESHOLD AMPLITUDE: 0.375 V
MDC IDC MSMT LEADCHNL RA PACING THRESHOLD PULSEWIDTH: 0.4 ms
MDC IDC MSMT LEADCHNL RA SENSING INTR AMPL: 2.6 mV
MDC IDC MSMT LEADCHNL RV IMPEDANCE VALUE: 637.5 Ohm
MDC IDC MSMT LEADCHNL RV SENSING INTR AMPL: 12 mV
MDC IDC PG SERIAL: 7866838
MDC IDC SET LEADCHNL RA PACING AMPLITUDE: 1.375

## 2016-02-08 ENCOUNTER — Encounter (HOSPITAL_COMMUNITY): Payer: Medicare Other

## 2016-02-09 ENCOUNTER — Ambulatory Visit (HOSPITAL_COMMUNITY)
Admission: RE | Admit: 2016-02-09 | Discharge: 2016-02-09 | Disposition: A | Discharge status: Home / Self Care | Payer: Medicare Other | Source: Ambulatory Visit | Attending: Cardiology | Admitting: Cardiology

## 2016-02-09 ENCOUNTER — Encounter (HOSPITAL_COMMUNITY): Payer: Self-pay

## 2016-02-09 VITALS — BP 106/50 | HR 64 | Wt 169.2 lb

## 2016-02-09 DIAGNOSIS — I739 Peripheral vascular disease, unspecified: Secondary | ICD-10-CM

## 2016-02-09 DIAGNOSIS — Z8249 Family history of ischemic heart disease and other diseases of the circulatory system: Secondary | ICD-10-CM | POA: Diagnosis not present

## 2016-02-09 DIAGNOSIS — N189 Chronic kidney disease, unspecified: Secondary | ICD-10-CM | POA: Diagnosis not present

## 2016-02-09 DIAGNOSIS — I7300 Raynaud's syndrome without gangrene: Secondary | ICD-10-CM | POA: Insufficient documentation

## 2016-02-09 DIAGNOSIS — I5022 Chronic systolic (congestive) heart failure: Secondary | ICD-10-CM | POA: Diagnosis not present

## 2016-02-09 DIAGNOSIS — Z7902 Long term (current) use of antithrombotics/antiplatelets: Secondary | ICD-10-CM | POA: Insufficient documentation

## 2016-02-09 DIAGNOSIS — I255 Ischemic cardiomyopathy: Secondary | ICD-10-CM | POA: Insufficient documentation

## 2016-02-09 DIAGNOSIS — Z79899 Other long term (current) drug therapy: Secondary | ICD-10-CM | POA: Diagnosis not present

## 2016-02-09 DIAGNOSIS — I2510 Atherosclerotic heart disease of native coronary artery without angina pectoris: Secondary | ICD-10-CM | POA: Insufficient documentation

## 2016-02-09 DIAGNOSIS — Z955 Presence of coronary angioplasty implant and graft: Secondary | ICD-10-CM | POA: Insufficient documentation

## 2016-02-09 DIAGNOSIS — Z87891 Personal history of nicotine dependence: Secondary | ICD-10-CM | POA: Insufficient documentation

## 2016-02-09 DIAGNOSIS — M79606 Pain in leg, unspecified: Secondary | ICD-10-CM | POA: Insufficient documentation

## 2016-02-09 DIAGNOSIS — Z7982 Long term (current) use of aspirin: Secondary | ICD-10-CM | POA: Diagnosis not present

## 2016-02-09 DIAGNOSIS — I252 Old myocardial infarction: Secondary | ICD-10-CM | POA: Insufficient documentation

## 2016-02-09 MED ORDER — SPIRONOLACTONE 25 MG PO TABS: 25.0000 mg | ORAL_TABLET | Freq: Every day | ORAL | Status: DC

## 2016-02-09 NOTE — Patient Instructions (Signed)
Increase Spironolactone to 25 mg (1 tab) daily  Labs in 2 weeks  We will contact you in 3 months to schedule your next appointment and echocardiogram

## 2016-02-09 NOTE — Progress Notes (Signed)
Advanced Heart Failure Medication Review by a Pharmacist  Does the patient  feel that his/her medications are working for him/her?  yes  Has the patient been experiencing any side effects to the medications prescribed?  no  Does the patient measure his/her own blood pressure or blood glucose at home?  Once in a while   Does the patient have any problems obtaining medications due to transportation or finances?   no  Understanding of regimen: excellent Understanding of indications: excellent Potential of compliance: excellent Patient understands to avoid NSAIDs. Patient understands to avoid decongestants.  Issues to address at subsequent visits: none   Pharmacist comments: Henry Neal is a very pleasant 82 yo man here for HF clinic f/u. No issues or concerns at this time. Great adherence to medications, although did miss 1 dose of carvedilol last night d/t forgetting. Denies any lightheadedness, dizziness. Overall feels great. Did mention having a bit of a cough with phlegm, attributed to allergies.    [name], [place].D., BCPS PGY2 Cardiology Pharmacy Resident Pager: [phone]   Time with patient: 5 min Preparation and documentation time: 5 min Total time: 10 min

## 2016-02-10 ENCOUNTER — Encounter (HOSPITAL_COMMUNITY): Payer: Medicare Other

## 2016-02-11 NOTE — Progress Notes (Signed)
Patient ID: Henry Neal, male   DOB: [birth year], 82 y.o.   MRN: [id number] PCP: Dr. Ashby Dawes Cardiology: Dr. Aundra Dubin  82 yo with history of CAD, chronic systolic CHF, LBBB, AAA s/p repair presents for cardiology followup.  He had a cardiac cath in 12/15, showing total occlusion of the LAD and 50-60% proximal RCA.  EF at that time was near-normal.  In 10/16, he developed dyspnea and epigastric pain.  He was noted to be in acute pulmonary edema and was diuresed.  Troponin was only mildly elevated at 0.47.  He ultimately underwent LHC showing hazy 80-90% RCA stenosis which was likely the culprit for his presentation.  Additionally, the LAD was only subtotally occluded on this cath.  He had DES to RCA.  Cardiac MRI was done, showing EF 22% with significant viability in LAD territory.  Therefore, CTO of LAD was opened with DES.  Echo in 2/17 showed EF persistently low at 15%.  He had St Jude CRT-P placement in 4/17.   Henry Neal is doing well today.  He is doing cardiac rehab. Dyspnea only with heavy activity.  Still working full time.  No chest pain.  No palpitations.  No orthopnea/PND.  Main problem now is low back pain and sciatica.   ECG: NSR, BiV paced  Labs (10/16): hgb 9.7, K 4.6, creatinine 1.12 Labs (11/16): K 5.9, creatinine 1.5 Labs (12/16); K 4.2, creatinine 1.07 => 1.02, BNP 448 Labs (1/17): K 4.1, creatinine 1.13, BNP 423, LDL 40, HDL 47 Labs (4/17): K 3.9, creatinine 1.03, HCT 32.6  PMH: 1. CKD 2. AAA s/p repair, followed at [facility]. 3. Carotid stenosis: s/p right CEA.   Carotid dopplers (2/16) with right CEA stable, < [id number] LICA stenosis.  Followed at [facility].  4. Chronic LBBB 5. LAD: LHC (12/15) with totally occluded LAD, 50-60% pRCA => medically managed.  10/16 admitted with NSTEMI.  LHC with subtotalled LAD occlusion, 80-90% pRCA with thrombus => DES RCA initially.  Cardiac MRI was done showing viability in the LAD territory, so he had DES to the CTO LAD.   6. Chronic systolic CHF:  Ischemic cardiomyopathy.  Echo (10/16) with EF 15% with wall motion abnormalities, moderately dilated RV with normal systolic function.  CMRI (10/16) with severe LV dilation, EF 22% with septal-lateral dyssynchrony, no LV thrombus, normal RV size/systolic function => significant viability noted in the LAD territory.  Echo (2/17): EF 15%, severe LV dilation, wall motion abnormalities noted, mild Henry, mildly dilated RV with normal systolic function, PASP 55 mmHg.  St Jude CRT-P 4/17.  7. ABIs (10/16) were normal.  8. Raynaud's syndrome.  9. 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   Current Outpatient Prescriptions  Medication Sig Dispense Refill  . aspirin EC 81 MG tablet Take 81 mg by mouth daily.      Marland Kitchen atorvastatin (LIPITOR) 40 MG tablet Take 1 tablet (40 mg total) by mouth daily at 6 PM. 30 tablet 6  . Calcium Carbonate-Vitamin D (CALCIUM 600 + D PO) Take 1 tablet by mouth 2 (two) times daily.     . carvedilol (COREG) 6.25 MG tablet Take 1 tablet (6.25 mg total) by mouth 2 (two) times daily. 180 tablet 3  . clopidogrel (PLAVIX) 75 MG tablet take 1 tablet by mouth once daily WITH BREAKFAST 90 tablet 0  . finasteride (PROSCAR) 5 MG tablet take 1 tablet by mouth  once daily    . gabapentin (NEURONTIN) 300 MG capsule Take 300 mg by mouth 2 (two) times daily.  0  . lisinopril (PRINIVIL,ZESTRIL) 5 MG tablet Take 5 mg by mouth daily.  0  . mometasone (ELOCON) 0.1 % cream Apply 1 application topically daily as needed (for irritation).     . Multiple Vitamin (MULTIVITAMIN PO) Take 1 tablet by mouth daily. ALIVE men's vitamin    . pantoprazole (PROTONIX) 40 MG tablet Take 40 mg by mouth daily.  0  . spironolactone (ALDACTONE) 25 MG tablet Take 1 tablet (25 mg total) by mouth daily. 30 tablet 3  . triamcinolone cream (KENALOG) 0.1 % Apply 1 application topically 2 (two) times daily as needed (skin).   0   . acetaminophen (TYLENOL) 500 MG tablet Take 1,000 mg by mouth daily as needed for mild pain or headache. Reported on 01/25/2016    . fluticasone (FLONASE) 50 MCG/ACT nasal spray Place 1 spray into both nostrils daily as needed for allergies.     Marland Kitchen ketoconazole (NIZORAL) 2 % cream apply to affected area ON FACE ONCE DAILY prn skin  0  . meclizine (ANTIVERT) 25 MG tablet Take 25 mg by mouth 2 (two) times daily as needed for dizziness.     Marland Kitchen NITROSTAT 0.4 MG SL tablet place 1 tablet under the tongue if needed every 5 minutes for chest pain CALL 911 AFTER 3 DOSES 25 tablet 10   No current facility-administered medications for this encounter.   BP 106/50 mmHg  Pulse 64  Wt 169 lb 4 oz (76.771 kg)  SpO2 100% General: NAD Neck: No JVD, no thyromegaly or thyroid nodule.  Lungs: Clear to auscultation bilaterally with normal respiratory effort. CV: Nondisplaced PMI.  Heart regular S1/S2, no S3/S4, no murmur.  No edema.  No carotid bruit.  Normal pedal pulses.  Abdomen: Soft, nontender, no hepatosplenomegaly, no distention.  Skin: Intact without lesions or rashes.  Neurologic: Alert and oriented x 3.  Psych: Normal affect. Extremities: No clubbing or cyanosis.  HEENT: Normal.   Assessment/Plan: 1. CAD: Recent NSTEMI with DES to RCA (culprit vessel) and subsequent DES to CTO of LAD (cMRI showed viability in the LAD distribution).  No exertional chest pain.  - Continue ASA 81 and Plavix. - Continue statin, good lipids in 1/17.  - Continue cardiac rehab.  2. Chronic systolic CHF: Ischemic cardiomyopathy.  NYHA class II symptoms.  He is not volume overloaded on exam.  EF is persistently low, 15% on echo in 2/17.   He had St Jude CRT-P placed in 4/17.  - Continue Coreg 6.25 mg bid and lisinopril 5 mg daily.   - Increase spironolactone to 25 mg daily and get BMET in 10 days.   - Repeat echo in 3 months or so to assess for improvement with CRT.  3. Leg pain:  ABIs normal.  Suspect sciatica. 4.  Carotid stenosis: Followed at [facility].  5. CKD: BMET 10 days.     Loralie Champagne 02/11/2016

## 2016-02-20 ENCOUNTER — Ambulatory Visit (HOSPITAL_COMMUNITY)
Admission: RE | Admit: 2016-02-20 | Discharge: 2016-02-20 | Disposition: A | Discharge status: Home / Self Care | Payer: Medicare Other | Source: Ambulatory Visit | Attending: Internal Medicine | Admitting: Internal Medicine

## 2016-02-20 DIAGNOSIS — I5022 Chronic systolic (congestive) heart failure: Secondary | ICD-10-CM

## 2016-02-20 LAB — BASIC METABOLIC PANEL
Anion gap: 10 (ref 5–15)
BUN: 31 mg/dL — ABNORMAL HIGH (ref 6–20)
CALCIUM: 9.3 mg/dL (ref 8.9–10.3)
CO2: 20 mmol/L — ABNORMAL LOW (ref 22–32)
CREATININE: 1.3 mg/dL — AB (ref 0.61–1.24)
Chloride: 105 mmol/L (ref 101–111)
GFR, EST AFRICAN AMERICAN: 56 mL/min — AB (ref >60)
GFR, EST NON AFRICAN AMERICAN: 48 mL/min — AB (ref >60)
Glucose, Bld: 116 mg/dL — ABNORMAL HIGH (ref 65–99)
Potassium: 5.3 mmol/L — ABNORMAL HIGH (ref 3.5–5.1)
SODIUM: 135 mmol/L (ref 135–145)

## 2016-02-22 ENCOUNTER — Telehealth (HOSPITAL_COMMUNITY): Payer: Self-pay | Admitting: Cardiology

## 2016-02-22 DIAGNOSIS — I509 Heart failure, unspecified: Secondary | ICD-10-CM

## 2016-02-22 MED ORDER — SPIRONOLACTONE 25 MG PO TABS: 12.5000 mg | ORAL_TABLET | Freq: Every day | ORAL | Status: DC

## 2016-02-22 NOTE — Telephone Encounter (Signed)
Pt aware and voiced understanding 

## 2016-02-22 NOTE — Telephone Encounter (Signed)
-----   Message from Larey Dresser, MD sent at 02/20/2016 10:23 AM EDT ----- Decrease spironolactone back to 12.5 mg daily.  Keep well-hydrated.  Repeat BMET 1 week.

## 2016-03-02 ENCOUNTER — Ambulatory Visit (HOSPITAL_COMMUNITY)
Admission: RE | Admit: 2016-03-02 | Discharge: 2016-03-02 | Disposition: A | Discharge status: Home / Self Care | Payer: Medicare Other | Source: Ambulatory Visit | Attending: Internal Medicine | Admitting: Internal Medicine

## 2016-03-02 DIAGNOSIS — I5022 Chronic systolic (congestive) heart failure: Secondary | ICD-10-CM | POA: Insufficient documentation

## 2016-03-02 LAB — BASIC METABOLIC PANEL
ANION GAP: 9 (ref 5–15)
BUN: 27 mg/dL — ABNORMAL HIGH (ref 6–20)
CO2: 25 mmol/L (ref 22–32)
Calcium: 9.3 mg/dL (ref 8.9–10.3)
Chloride: 104 mmol/L (ref 101–111)
Creatinine, Ser: 1.09 mg/dL (ref 0.61–1.24)
GFR, EST NON AFRICAN AMERICAN: 59 mL/min — AB (ref >60)
GLUCOSE: 109 mg/dL — AB (ref 65–99)
POTASSIUM: 4.4 mmol/L (ref 3.5–5.1)
Sodium: 138 mmol/L (ref 135–145)

## 2016-03-07 ENCOUNTER — Other Ambulatory Visit (HOSPITAL_COMMUNITY): Payer: Self-pay | Admitting: Student

## 2016-03-21 ENCOUNTER — Encounter (HOSPITAL_COMMUNITY): Payer: Self-pay

## 2016-03-21 ENCOUNTER — Emergency Department (HOSPITAL_COMMUNITY): Payer: Worker's Compensation

## 2016-03-21 ENCOUNTER — Emergency Department (HOSPITAL_COMMUNITY)
Admission: EM | Admit: 2016-03-21 | Discharge: 2016-03-22 | Disposition: A | Discharge status: Home / Self Care | Payer: Worker's Compensation | Attending: Emergency Medicine | Admitting: Emergency Medicine

## 2016-03-21 DIAGNOSIS — Y990 Civilian activity done for income or pay: Secondary | ICD-10-CM | POA: Insufficient documentation

## 2016-03-21 DIAGNOSIS — S61215A Laceration without foreign body of left ring finger without damage to nail, initial encounter: Secondary | ICD-10-CM | POA: Insufficient documentation

## 2016-03-21 DIAGNOSIS — I25119 Atherosclerotic heart disease of native coronary artery with unspecified angina pectoris: Secondary | ICD-10-CM | POA: Diagnosis not present

## 2016-03-21 DIAGNOSIS — I10 Essential (primary) hypertension: Secondary | ICD-10-CM | POA: Insufficient documentation

## 2016-03-21 DIAGNOSIS — Z7982 Long term (current) use of aspirin: Secondary | ICD-10-CM | POA: Insufficient documentation

## 2016-03-21 DIAGNOSIS — K219 Gastro-esophageal reflux disease without esophagitis: Secondary | ICD-10-CM | POA: Insufficient documentation

## 2016-03-21 DIAGNOSIS — Y9389 Activity, other specified: Secondary | ICD-10-CM | POA: Insufficient documentation

## 2016-03-21 DIAGNOSIS — S61213A Laceration without foreign body of left middle finger without damage to nail, initial encounter: Secondary | ICD-10-CM | POA: Insufficient documentation

## 2016-03-21 DIAGNOSIS — Y9289 Other specified places as the place of occurrence of the external cause: Secondary | ICD-10-CM | POA: Diagnosis not present

## 2016-03-21 DIAGNOSIS — S60022A Contusion of left index finger without damage to nail, initial encounter: Secondary | ICD-10-CM | POA: Insufficient documentation

## 2016-03-21 DIAGNOSIS — M1990 Unspecified osteoarthritis, unspecified site: Secondary | ICD-10-CM | POA: Insufficient documentation

## 2016-03-21 DIAGNOSIS — S61217A Laceration without foreign body of left little finger without damage to nail, initial encounter: Secondary | ICD-10-CM | POA: Diagnosis not present

## 2016-03-21 DIAGNOSIS — Z85828 Personal history of other malignant neoplasm of skin: Secondary | ICD-10-CM | POA: Insufficient documentation

## 2016-03-21 DIAGNOSIS — Z79899 Other long term (current) drug therapy: Secondary | ICD-10-CM | POA: Diagnosis not present

## 2016-03-21 DIAGNOSIS — W230XXA Caught, crushed, jammed, or pinched between moving objects, initial encounter: Secondary | ICD-10-CM | POA: Insufficient documentation

## 2016-03-21 DIAGNOSIS — I252 Old myocardial infarction: Secondary | ICD-10-CM | POA: Diagnosis not present

## 2016-03-21 DIAGNOSIS — Z87438 Personal history of other diseases of male genital organs: Secondary | ICD-10-CM | POA: Insufficient documentation

## 2016-03-21 DIAGNOSIS — Z23 Encounter for immunization: Secondary | ICD-10-CM | POA: Diagnosis not present

## 2016-03-21 DIAGNOSIS — S61412A Laceration without foreign body of left hand, initial encounter: Secondary | ICD-10-CM | POA: Diagnosis present

## 2016-03-21 DIAGNOSIS — S61219A Laceration without foreign body of unspecified finger without damage to nail, initial encounter: Secondary | ICD-10-CM

## 2016-03-21 DIAGNOSIS — Z7902 Long term (current) use of antithrombotics/antiplatelets: Secondary | ICD-10-CM | POA: Insufficient documentation

## 2016-03-21 DIAGNOSIS — E785 Hyperlipidemia, unspecified: Secondary | ICD-10-CM | POA: Insufficient documentation

## 2016-03-21 DIAGNOSIS — Z87891 Personal history of nicotine dependence: Secondary | ICD-10-CM | POA: Insufficient documentation

## 2016-03-21 MED ORDER — LIDOCAINE HCL (PF) 1 % IJ SOLN
5.0000 mL | Freq: Once | INTRAMUSCULAR | Status: AC
  Administered 2016-03-21: 5 mL via INTRADERMAL
  Filled 2016-03-21: qty 5

## 2016-03-21 MED ORDER — TETANUS-DIPHTH-ACELL PERTUSSIS 5-2.5-18.5 LF-MCG/0.5 IM SUSP
0.5000 mL | Freq: Once | INTRAMUSCULAR | Status: AC
  Administered 2016-03-21: 0.5 mL via INTRAMUSCULAR
  Filled 2016-03-21: qty 0.5

## 2016-03-21 MED ORDER — LIDOCAINE HCL (PF) 1 % IJ SOLN: 5.0000 mL | Freq: Once | INTRAMUSCULAR | Status: DC

## 2016-03-21 MED ORDER — LIDOCAINE HCL (PF) 1 % IJ SOLN
INTRAMUSCULAR | Status: DC
Start: 2016-03-21 — End: 2016-03-22
  Filled 2016-03-21: qty 5

## 2016-03-21 NOTE — ED Notes (Signed)
Patient here with multiple lacerations to left hand 3rd-5th fingers after cutting same on machine this evening. Good rom to same. Bleeding controlled with saline dressing

## 2016-03-21 NOTE — ED Notes (Signed)
The pt returned from xray some steady bleeding

## 2016-03-21 NOTE — ED Provider Notes (Signed)
CSN: [id number]     Arrival date & time 03/21/16  1803 History   First MD Initiated Contact with Patient 03/21/16 2140     Chief Complaint  Patient presents with  . Extremity Laceration     (Consider location/radiation/quality/duration/timing/severity/associated sxs/prior Treatment) Patient is a 82 y.o. male presenting with skin laceration. The history is provided by the patient. No language interpreter was used.  Laceration Location:  Hand Hand laceration location:  L hand (Left small, ring, and middle fingers) Length (cm):  2cm laceration small finger, 2cm laceration ring finger, 1cm laceration Depth:  Cutaneous Quality: jagged   Bleeding: controlled   Injury mechanism: Caught in a lathe (machinery for making machine parts), while trying to adjust settings, thinks he caugh left hand on a moving serrated belt. Pain details:    Quality:  Aching   Severity:  Mild   Timing:  Constant   Progression:  Unchanged Relieved by:  Pressure Worsened by:  Nothing tried Ineffective treatments:  None tried Tetanus status:  Unknown   Past Medical History  Diagnosis Date  . Palpitation   . AAA (abdominal aortic aneurysm) ([place])   . Arthritis   . Hiatal hernia   . Dizziness     takes Antivert daily as needed  . ED (erectile dysfunction)   . Sigmoid diverticulosis   . Thyroid disease   . T12 compression fracture ([place])   . Allergic rhinitis     uses Flonase daily  . Chronic venous insufficiency   . Cancer ([place])     skin  . Carotid artery occlusion   . Abnormal stress test   . Hypertension     takes Proscar,Dyazide,Avapro,and Metoprolol daily  . GERD (gastroesophageal reflux disease)     takes Omeprazole daily  . Coronary artery disease   . Anginal pain (H[facility])     pain in neck,shoulders,down arms occ  . Shortness of breath   . Hx of echocardiogram 04/04/2010    showed a borderline dilatd left ventricle with mild left ventricle hypertrophy and an EF 50-55% doppler suggested  diastolic dysfunction, although the pattern is probably normal for an 82 yrs old. The left atrium was indeed mildly dilated at 42 mm, but there are no significant valvular abnormalities.   . Hyperlipidemia   . Myocardial infarction [facility]) Oct. 11, 2016    Heart Attack   Past Surgical History  Procedure Laterality Date  . Rotator cuff repair  2003    left arm  . Abdominal aortic aneurysm repair  02/24/2009    AAA Stenting  . Hernia repair Bilateral   . Hand surgery Right     tendon, lft 90's  . Endarterectomy Right 10/27/2013    Procedure: ENDARTERECTOMY CAROTID;  Surgeon: Elam Dutch, MD;  Location: [facility];  Service: Vascular;  Laterality: Right;  . Melanoma removed    . Left heart catheterization with coronary angiogram N/A 10/12/2013    Procedure: LEFT HEART CATHETERIZATION WITH CORONARY ANGIOGRAM;  Surgeon: Lorretta Harp, MD;  Location: [facility] CATH LAB;  Service: Cardiovascular;  Laterality: N/A;  . Cardiac catheterization N/A 08/04/2015    Procedure: Right/Left Heart Cath and Coronary Angiography;  Surgeon: Larey Dresser, MD;  Location: [facility] CV LAB;  Service: Cardiovascular;  Laterality: N/A;  . Cardiac catheterization N/A 08/04/2015    Procedure: Coronary Stent Intervention;  Surgeon: Burnell Blanks, MD;  Location: [place] CV LAB;  Service: Cardiovascular;  Laterality: N/A;  . Cardiac catheterization N/A 08/08/2015    Procedure:  Coronary Stent Intervention;  Surgeon: Peter M Martinique, MD;  Location: [facility] CV LAB;  Service: Cardiovascular;  Laterality: N/A;  . Ep implantable device N/A 01/26/2016    Procedure: BiV Pacemaker Insertion CRT-P;  Surgeon: Evans Lance, MD;  Location: [place] CV LAB;  Service: Cardiovascular;  Laterality: N/A;   Family History  Problem Relation Age of Onset  . Heart disease Mother     before age 7  . COPD Mother   . Diabetes Mother   . Hyperlipidemia Mother   . Hypertension Mother    Social History  Substance Use  Topics  . Smoking status: Former Smoker -- 2.00 packs/day for 30 years    Types: Cigarettes    Quit date: 10/22/1977  . Smokeless tobacco: Former Systems developer    Quit date: 10/22/1984  . Alcohol Use: 4.2 oz/week    7 Cans of beer per week    Review of Systems  Constitutional: Negative for fever and chills.  HENT: Negative for congestion and rhinorrhea.   Eyes: Negative for visual disturbance.  Respiratory: Negative for shortness of breath.   Cardiovascular: Negative for chest pain.  Gastrointestinal: Negative for abdominal pain.  Genitourinary: Negative for dysuria and difficulty urinating.  Musculoskeletal: Negative for back pain and neck pain.  Skin: Positive for wound.  Neurological: Negative for weakness and numbness.  Psychiatric/Behavioral: Negative for confusion.      Allergies  Sinequan; Verapamil; and Procaine hcl  Home Medications   Prior to Admission medications   Medication Sig Start Date End Date Taking? Authorizing Provider  acetaminophen (TYLENOL) 500 MG tablet Take 1,000 mg by mouth daily as needed for mild pain or headache. Reported on 01/25/2016    Historical Provider, MD  aspirin EC 81 MG tablet Take 81 mg by mouth daily.      Historical Provider, MD  atorvastatin (LIPITOR) 40 MG tablet Take 1 tablet (40 mg total) by mouth daily at 6 PM. 08/09/15   Shirley Friar, PA-C  Calcium Carbonate-Vitamin D (CALCIUM 600 + D PO) Take 1 tablet by mouth 2 (two) times daily.     Historical Provider, MD  carvedilol (COREG) 6.25 MG tablet Take 1 tablet (6.25 mg total) by mouth 2 (two) times daily. 08/24/15   Larey Dresser, MD  clopidogrel (PLAVIX) 75 MG tablet take 1 tablet by mouth once daily WITH BREAKFAST 03/07/16   Shirley Friar, PA-C  finasteride (PROSCAR) 5 MG tablet take 1 tablet by mouth once daily 11/19/14   Historical Provider, MD  fluticasone (FLONASE) 50 MCG/ACT nasal spray Place 1 spray into both nostrils daily as needed for allergies.  08/09/13    Historical Provider, MD  gabapentin (NEURONTIN) 300 MG capsule Take 300 mg by mouth 2 (two) times daily. 11/01/14   Historical Provider, MD  ketoconazole (NIZORAL) 2 % cream apply to affected area ON FACE ONCE DAILY prn skin 04/27/15   Historical Provider, MD  lisinopril (PRINIVIL,ZESTRIL) 5 MG tablet Take 5 mg by mouth daily. 11/13/15   Historical Provider, MD  meclizine (ANTIVERT) 25 MG tablet Take 25 mg by mouth 2 (two) times daily as needed for dizziness.     Historical Provider, MD  mometasone (ELOCON) 0.1 % cream Apply 1 application topically daily as needed (for irritation).  07/30/13   Historical Provider, MD  Multiple Vitamin (MULTIVITAMIN PO) Take 1 tablet by mouth daily. ALIVE men's vitamin    Historical Provider, MD  NITROSTAT 0.4 MG SL tablet place 1 tablet under the tongue if  needed every 5 minutes for chest pain CALL 911 AFTER 3 DOSES 06/20/15   Brittainy Erie Noe, PA-C  pantoprazole (PROTONIX) 40 MG tablet Take 40 mg by mouth daily. 02/14/15   Historical Provider, MD  spironolactone (ALDACTONE) 25 MG tablet Take 0.5 tablets (12.5 mg total) by mouth daily. 02/22/16   Larey Dresser, MD  triamcinolone cream (KENALOG) 0.1 % Apply 1 application topically 2 (two) times daily as needed (skin).  09/24/15   Historical Provider, MD   BP 120/92 mmHg  Pulse 60  Temp(Src) 98.9 F (37.2 C) (Oral)  Resp 16  SpO2 98% Physical Exam  Constitutional: He is oriented to person, place, and time. He appears well-developed and well-nourished.  HENT:  Head: Normocephalic and atraumatic.  Eyes: EOM are normal. Pupils are equal, round, and reactive to light.  Neck: Normal range of motion. Neck supple.  Cardiovascular: Normal rate, regular rhythm and intact distal pulses.   Pulmonary/Chest: Effort normal. No respiratory distress.  Abdominal: Soft. He exhibits no distension.  Musculoskeletal: Normal range of motion.  2cm jagged laceration on left lateral small finger with small amount of active oozing. 2cm  laceration on dorsal distal ring finger with minimal oozing. 1cm laceration on proximal dorsal ring finger. 1cm laceration on proximal dorsal middle finger. There is bruising and mild TTP about left index finger. Wounds irrigated and explored, no fb or tendon injury identified. Able to perform full ROM of fingers, grip strength 5/5, sensation intact.   Neurological: He is alert and oriented to person, place, and time.  Skin: Skin is warm and dry. No rash noted.  Psychiatric: He has a normal mood and affect.  Nursing note and vitals reviewed.   ED Course  .Marland KitchenLaceration Repair Date/Time: 03/22/2016 1:14 AM Performed by: Tamala Julian, Keyaria Lawson B Authorized by: Darnelle Bos B Consent: Verbal consent obtained. Required items: required blood products, implants, devices, and special equipment available Body area: upper extremity Location details: left small finger Laceration length: 3 cm Contamination: The wound is contaminated. Foreign bodies: no foreign bodies Tendon involvement: none Nerve involvement: none Vascular damage: no Anesthesia: local infiltration Local anesthetic: lidocaine 1% without epinephrine Preparation: Patient was prepped and draped in the usual sterile fashion. Irrigation solution: saline Irrigation method: syringe Amount of cleaning: extensive Debridement: minimal Degree of undermining: minimal Skin closure: 5-0 nylon (5) Subcutaneous closure: 4-0 Vicryl (2) Technique: simple and horizontal mattress Approximation: loose Approximation difficulty: complex Dressing: 4x4 sterile gauze, antibiotic ointment and gauze roll Patient tolerance: Patient tolerated the procedure well with no immediate complications  .Marland KitchenLaceration Repair Date/Time: 03/22/2016 1:16 AM Performed by: Tamala Julian, Teila Skalsky B Authorized by: Darnelle Bos B Consent: Verbal consent obtained. Time out: Immediately prior to procedure a "time out" was called to verify the correct patient, procedure, equipment, support staff and  site/side marked as required. Body area: upper extremity (dorsal distal left ring finger) Contamination: The wound is contaminated. Foreign bodies: no foreign bodies Tendon involvement: none Nerve involvement: none Vascular damage: no Anesthesia: local infiltration Local anesthetic: lidocaine 1% without epinephrine Preparation: Patient was prepped and draped in the usual sterile fashion. Irrigation solution: saline Irrigation method: syringe Amount of cleaning: extensive Debridement: minimal Degree of undermining: none Skin closure: 5-0 nylon (5 sutures) Subcutaneous closure: 4-0 Vicryl (1 suture) Technique: simple Approximation: loose Approximation difficulty: complex Dressing: 4x4 sterile gauze, antibiotic ointment and gauze roll Patient tolerance: Patient tolerated the procedure well with no immediate complications  .Marland KitchenLaceration Repair Date/Time: 03/22/2016 1:18 AM Performed by: Tamala Julian, Icelynn Onken B Authorized by: Tamala Julian, Nili Honda  B Location: proximal dorsal left ring finger. Laceration length: 1 cm Foreign bodies: no foreign bodies Tendon involvement: none Nerve involvement: none Vascular damage: no Anesthesia: local infiltration Local anesthetic: lidocaine 1% without epinephrine Preparation: Patient was prepped and draped in the usual sterile fashion. Irrigation solution: saline Irrigation method: syringe Amount of cleaning: extensive Debridement: none Skin closure: 5-0 nylon (2 sutures) Number of sutures: 2 Technique: simple Approximation difficulty: simple Dressing: 4x4 sterile gauze, antibiotic ointment and gauze roll Patient tolerance: Patient tolerated the procedure well with no immediate complications  .Marland KitchenLaceration Repair Date/Time: 03/22/2016 1:19 AM Performed by: Tamala Julian, Harrell Niehoff B Authorized by: Darnelle Bos B Consent: Verbal consent obtained. Risks and benefits: risks, benefits and alternatives were discussed Consent given by: patient Location: left dorsal distal middle  finger. Foreign bodies: no foreign bodies Tendon involvement: none Nerve involvement: none Vascular damage: no Anesthesia: local infiltration Local anesthetic: lidocaine 1% without epinephrine Preparation: Patient was prepped and draped in the usual sterile fashion. Irrigation solution: saline Irrigation method: syringe Amount of cleaning: standard Debridement: none Degree of undermining: none Skin closure: 5-0 nylon Number of sutures: 2 Technique: simple Approximation: close Approximation difficulty: simple Dressing: 4x4 sterile gauze, antibiotic ointment, gauze packing and gauze roll Patient tolerance: Patient tolerated the procedure well with no immediate complications   (including critical care time) Labs Review Labs Reviewed - No data to display  Imaging Review Dg Hand Complete Left  03/21/2016  CLINICAL DATA:  Hand got caught in a lathe. Multiple lacerations. Rule out fracture. EXAM: LEFT HAND - COMPLETE 3+ VIEW COMPARISON:  None. FINDINGS: No acute fracture or dislocation. Scattered osteoarthritis. Probable remote radial styloid fracture. Lacerations noted multiple digits overlying dressing. No radiopaque foreign body. IMPRESSION: No acute fracture or subluxation. Electronically Signed   By: Jeb Levering M.D.   On: 03/21/2016 22:54   I have personally reviewed and evaluated these images and lab results as part of my medical decision-making.   EKG Interpretation None      MDM   Final diagnoses:  Laceration of finger of left hand, initial encounter    Patient is a 82 year old male who presents with multiple left finger lacerations after an accident at work. He is on Plavix but no other blood thinners. Exam of the hip with lacerations to left small finger, index finger and middle fingers described above. Tdap updated. XR of left hand is negative for fracture. Wounds irrigated thoroughly and explored without any evidence of tendon or vascular injury. Lacerations  repaired as described above. Wounds were contaminated with car grease and required debridement, particularly over left small finger and index finger. Po Keflex given.  Patient was discharged in stable condition. Wound care instructions given and strict return precautions discussed. Advised to follow up with employer and worker's compensation as injury occurred at work. Will follow up with PCP for suture removal in 3-4 days for wound re-check and for suture removal in 8-10 days. Rx Keflex given for contaminated wound. Patient reports understanding and agreement with plan.   Patient seen and discussed with Dr. Oleta Mouse, ED attending    Gibson Ramp, MD 03/22/16 [id number]  Forde Dandy, MD 03/22/16 1212

## 2016-03-21 NOTE — ED Notes (Signed)
The pt has his lt middle ring and little fingers with cuts scattered on them  He was reaching under siomething and his hand was snagged  Minimal bleeding at present

## 2016-03-22 ENCOUNTER — Other Ambulatory Visit (HOSPITAL_COMMUNITY): Payer: Self-pay | Admitting: *Deleted

## 2016-03-22 DIAGNOSIS — R7301 Impaired fasting glucose: Secondary | ICD-10-CM | POA: Diagnosis not present

## 2016-03-22 DIAGNOSIS — I10 Essential (primary) hypertension: Secondary | ICD-10-CM | POA: Diagnosis not present

## 2016-03-22 DIAGNOSIS — E782 Mixed hyperlipidemia: Secondary | ICD-10-CM | POA: Diagnosis not present

## 2016-03-22 MED ORDER — LISINOPRIL 5 MG PO TABS: 5.0000 mg | ORAL_TABLET | Freq: Every day | ORAL | Status: DC

## 2016-03-22 MED ORDER — CEPHALEXIN 250 MG PO CAPS
500.0000 mg | ORAL_CAPSULE | Freq: Once | ORAL | Status: AC
  Administered 2016-03-22: 500 mg via ORAL
  Filled 2016-03-22: qty 2

## 2016-03-22 MED ORDER — CEPHALEXIN 500 MG PO CAPS: 500.0000 mg | ORAL_CAPSULE | Freq: Four times a day (QID) | ORAL | Status: AC

## 2016-03-22 NOTE — Discharge Instructions (Signed)
Keep your wound clean and dry for the first 24 hours. Then you may remove the bandage and wash it gently with non-scented soap and warm water. Dry thoroughly and change bandage. You should be seen in the next 3-4 days for wound re-check to ensure it is healing without signs of infection. Your sutures will need to be removed. See your doctor or return to the ED to get your sutures out in 8-10 days.  Return to the ED or see your healthcare provider immediately if you experience worsening pain, fever, swelling of the hand, blue fingers or loss of sensation, redness around your wounds that is explaning, pus draining from wounds, or red streaking up your arm.

## 2016-03-26 DIAGNOSIS — S61219A Laceration without foreign body of unspecified finger without damage to nail, initial encounter: Secondary | ICD-10-CM | POA: Diagnosis not present

## 2016-03-29 DIAGNOSIS — I2510 Atherosclerotic heart disease of native coronary artery without angina pectoris: Secondary | ICD-10-CM | POA: Diagnosis not present

## 2016-03-29 DIAGNOSIS — I739 Peripheral vascular disease, unspecified: Secondary | ICD-10-CM | POA: Diagnosis not present

## 2016-03-29 DIAGNOSIS — E782 Mixed hyperlipidemia: Secondary | ICD-10-CM | POA: Diagnosis not present

## 2016-03-29 DIAGNOSIS — I10 Essential (primary) hypertension: Secondary | ICD-10-CM | POA: Diagnosis not present

## 2016-04-02 DIAGNOSIS — S61412A Laceration without foreign body of left hand, initial encounter: Secondary | ICD-10-CM | POA: Insufficient documentation

## 2016-04-02 DIAGNOSIS — S61219A Laceration without foreign body of unspecified finger without damage to nail, initial encounter: Secondary | ICD-10-CM | POA: Insufficient documentation

## 2016-04-12 ENCOUNTER — Ambulatory Visit (INDEPENDENT_AMBULATORY_CARE_PROVIDER_SITE_OTHER): Payer: Medicare Other | Admitting: Podiatry

## 2016-04-12 ENCOUNTER — Encounter: Payer: Self-pay | Admitting: Podiatry

## 2016-04-12 DIAGNOSIS — B351 Tinea unguium: Secondary | ICD-10-CM | POA: Diagnosis not present

## 2016-04-12 DIAGNOSIS — L84 Corns and callosities: Secondary | ICD-10-CM | POA: Diagnosis not present

## 2016-04-12 DIAGNOSIS — M79676 Pain in unspecified toe(s): Secondary | ICD-10-CM

## 2016-04-12 NOTE — Progress Notes (Signed)
Patient ID: Henry Neal, male   DOB: 1927/09/13, 82 y.o.   MRN: [id number] Complaint:  Visit Type: Patient returns to my office for continued preventative foot care services. Complaint: Patient states" my nails have grown long and thick and become painful to walk and wear shoes"  He presents for preventative foot care services. No changes to ROS  Podiatric Exam: Vascular: dorsalis pedis and posterior tibial pulses are palpable bilateral. Capillary return is immediate. Temperature gradient is WNL. Skin turgor WNL  Sensorium: Normal Semmes Weinstein monofilament test. Normal tactile sensation bilaterally. Nail Exam: Pt has thick disfigured discolored nails with subungual debris noted bilateral entire nail hallux through fifth toenails Ulcer Exam: There is no evidence of ulcer or pre-ulcerative changes or infection. Orthopedic Exam: Muscle tone and strength are WNL. No limitations in general ROM. No crepitus or effusions noted. Foot type and digits show no abnormalities. Bony prominences are unremarkable. Skin: No Porokeratosis. No infection or ulcers. Distal Clavi second toe right.  Diagnosis:  Tinea unguium, Pain in right toe, pain in left toes, Second toe clavi right  Treatment & Plan Procedures and Treatment: Consent by patient was obtained for treatment procedures. The patient understood the discussion of treatment and procedures well. All questions were answered thoroughly reviewed. Debridement of mycotic and hypertrophic toenails, 1 through 5 bilateral and clearing of subungual debris. No ulceration, no infection noted. Debride clavi. Return Visit-Office Procedure: Patient instructed to return to the office for a follow up visit 3 months for continued evaluation and treatment.   Gardiner Barefoot DPM

## 2016-04-26 DIAGNOSIS — L218 Other seborrheic dermatitis: Secondary | ICD-10-CM | POA: Diagnosis not present

## 2016-04-26 DIAGNOSIS — D171 Benign lipomatous neoplasm of skin and subcutaneous tissue of trunk: Secondary | ICD-10-CM | POA: Diagnosis not present

## 2016-04-26 DIAGNOSIS — B351 Tinea unguium: Secondary | ICD-10-CM | POA: Diagnosis not present

## 2016-04-26 DIAGNOSIS — D692 Other nonthrombocytopenic purpura: Secondary | ICD-10-CM | POA: Diagnosis not present

## 2016-04-26 DIAGNOSIS — Z85828 Personal history of other malignant neoplasm of skin: Secondary | ICD-10-CM | POA: Diagnosis not present

## 2016-04-26 DIAGNOSIS — L570 Actinic keratosis: Secondary | ICD-10-CM | POA: Diagnosis not present

## 2016-04-26 DIAGNOSIS — D225 Melanocytic nevi of trunk: Secondary | ICD-10-CM | POA: Diagnosis not present

## 2016-04-26 DIAGNOSIS — L821 Other seborrheic keratosis: Secondary | ICD-10-CM | POA: Diagnosis not present

## 2016-04-26 DIAGNOSIS — L720 Epidermal cyst: Secondary | ICD-10-CM | POA: Diagnosis not present

## 2016-04-26 DIAGNOSIS — Z85820 Personal history of malignant melanoma of skin: Secondary | ICD-10-CM | POA: Diagnosis not present

## 2016-05-03 ENCOUNTER — Encounter: Payer: Self-pay | Admitting: Internal Medicine

## 2016-05-03 ENCOUNTER — Ambulatory Visit (INDEPENDENT_AMBULATORY_CARE_PROVIDER_SITE_OTHER): Payer: Medicare Other | Admitting: Internal Medicine

## 2016-05-03 VITALS — BP 130/60 | HR 55 | Ht 70.5 in | Wt 172.8 lb

## 2016-05-03 DIAGNOSIS — I5022 Chronic systolic (congestive) heart failure: Secondary | ICD-10-CM

## 2016-05-03 LAB — CUP PACEART INCLINIC DEVICE CHECK
Brady Statistic RA Percent Paced: 6.3 %
Brady Statistic RV Percent Paced: 99 %
Implantable Lead Implant Date: 20170406
Implantable Lead Implant Date: 20170406
Lead Channel Impedance Value: 512.5 Ohm
Lead Channel Pacing Threshold Amplitude: 0.75 V
Lead Channel Pacing Threshold Amplitude: 0.75 V
Lead Channel Pacing Threshold Amplitude: 1.25 V
Lead Channel Pacing Threshold Pulse Width: 0.4 ms
Lead Channel Pacing Threshold Pulse Width: 0.4 ms
Lead Channel Setting Pacing Amplitude: 2 V
Lead Channel Setting Pacing Amplitude: 2 V
Lead Channel Setting Pacing Pulse Width: 0.4 ms
Lead Channel Setting Pacing Pulse Width: 0.4 ms
MDC IDC LEAD IMPLANT DT: 20170406
MDC IDC LEAD LOCATION: 753858
MDC IDC LEAD LOCATION: 753859
MDC IDC LEAD LOCATION: 753860
MDC IDC MSMT BATTERY REMAINING LONGEVITY: 124.8
MDC IDC MSMT BATTERY VOLTAGE: 3.02 V
MDC IDC MSMT LEADCHNL LV IMPEDANCE VALUE: 925 Ohm
MDC IDC MSMT LEADCHNL LV PACING THRESHOLD PULSEWIDTH: 0.4 ms
MDC IDC MSMT LEADCHNL RA PACING THRESHOLD AMPLITUDE: 0.5 V
MDC IDC MSMT LEADCHNL RA PACING THRESHOLD PULSEWIDTH: 0.4 ms
MDC IDC MSMT LEADCHNL RA SENSING INTR AMPL: 2.4 mV
MDC IDC MSMT LEADCHNL RV IMPEDANCE VALUE: 675 Ohm
MDC IDC MSMT LEADCHNL RV SENSING INTR AMPL: 12 mV
MDC IDC PG SERIAL: 7866838
MDC IDC SESS DTM: 20170713131456
MDC IDC SET LEADCHNL RA PACING AMPLITUDE: 1.375
MDC IDC SET LEADCHNL RV SENSING SENSITIVITY: 2 mV

## 2016-05-03 NOTE — Patient Instructions (Addendum)
Medication Instructions:  Your physician recommends that you continue on your current medications as directed. Please refer to the Current Medication list given to you today.   Labwork: None ordered   Testing/Procedures: None ordered   Follow-Up: Your physician wants you to follow-up in: April 2018 with Dr Knox Saliva will receive a reminder letter in the mail two months in advance. If you don't receive a letter, please call our office to schedule the follow-up appointment.  Remote monitoring is used to monitor your Pacemaker  from home. This monitoring reduces the number of office visits required to check your device to one time per year. It allows [place] to keep an eye on the functioning of your device to ensure it is working properly. You are scheduled for a device check from home on 08/02/16. You may send your transmission at any time that day. If you have a wireless device, the transmission will be sent automatically. After your physician reviews your transmission, you will receive a postcard with your next transmission date.     Any Other Special Instructions Will Be Listed Below (If Applicable).     If you need a refill on your cardiac medications before your next appointment, please call your pharmacy.

## 2016-05-03 NOTE — Progress Notes (Signed)
HPI [name] returns today for followup, s/p BiV PPM insertion. He is a pleasant 82 yo man who still works. He is s/p remote MI with occluded LAD and RCA and has severe LV dysfunction. The patient also has LBBB with a QRS duration of 165 ms. EF is 15% by echo.  He has not had syncope. He denies chest pain. He has class 2-3 CHF symptoms. He denies anginal symptoms. He underwent successful biV PM insertion 4 months ago.  Allergies  Allergen Reactions  . Doxepin Other (See Comments)    hallucinations   . Sinequan [Doxepin Hcl] Other (See Comments)    hallucinations  . Verapamil Other (See Comments)    Caused heart to race  . Procaine Hcl Other (See Comments)    Tingling all over as soon as it was injected     Current Outpatient Prescriptions  Medication Sig Dispense Refill  . acetaminophen (TYLENOL) 500 MG tablet Take 1,000 mg by mouth daily as needed for mild pain or headache. Reported on 01/25/2016    . aspirin EC 81 MG tablet Take 81 mg by mouth daily.      Marland Kitchen atorvastatin (LIPITOR) 40 MG tablet Take 1 tablet (40 mg total) by mouth daily at 6 PM. 30 tablet 6  . Calcium Carbonate-Vitamin D (CALCIUM 600 + D PO) Take 1 tablet by mouth 2 (two) times daily.     . carvedilol (COREG) 6.25 MG tablet Take 1 tablet (6.25 mg total) by mouth 2 (two) times daily. 180 tablet 3  . clopidogrel (PLAVIX) 75 MG tablet take 1 tablet by mouth once daily WITH BREAKFAST 90 tablet 0  . finasteride (PROSCAR) 5 MG tablet take 1 tablet by mouth once daily    . fluticasone (FLONASE) 50 MCG/ACT nasal spray Place 1 spray into both nostrils daily as needed for allergies.     Marland Kitchen gabapentin (NEURONTIN) 300 MG capsule Take 300 mg by mouth 2 (two) times daily.  0  . ketoconazole (NIZORAL) 2 % cream Apply to face daily as needed  0  . lisinopril (PRINIVIL,ZESTRIL) 5 MG tablet Take 1 tablet (5 mg total) by mouth daily. 90 tablet 3  . meclizine (ANTIVERT) 25 MG tablet Take 25 mg by mouth 2 (two) times daily as needed  for dizziness.     . mometasone (ELOCON) 0.1 % cream Apply 1 application topically daily as needed (for irritation).     . Multiple Vitamin (MULTIVITAMIN PO) Take 1 tablet by mouth daily. ALIVE men's vitamin    . NITROSTAT 0.4 MG SL tablet place 1 tablet under the tongue if needed every 5 minutes for chest pain CALL 911 AFTER 3 DOSES 25 tablet 10  . pantoprazole (PROTONIX) 40 MG tablet Take 40 mg by mouth daily.  0  . spironolactone (ALDACTONE) 25 MG tablet Take 0.5 tablets (12.5 mg total) by mouth daily. 30 tablet 3  . triamcinolone cream (KENALOG) 0.1 % Apply 1 application topically 2 (two) times daily as needed (skin).   0   No current facility-administered medications for this visit.     Past Medical History  Diagnosis Date  . Palpitation   . AAA (abdominal aortic aneurysm) ([place])   . Arthritis   . Hiatal hernia   . Dizziness     takes Antivert daily as needed  . ED (erectile dysfunction)   . Sigmoid diverticulosis   . Thyroid disease   . T12 compression fracture ([place])   . Allergic rhinitis  uses Flonase daily  . Chronic venous insufficiency   . Cancer ([place])     skin  . Carotid artery occlusion   . Abnormal stress test   . Hypertension     takes Proscar,Dyazide,Avapro,and Metoprolol daily  . GERD (gastroesophageal reflux disease)     takes Omeprazole daily  . Coronary artery disease   . Anginal pain ([facility])     pain in neck,shoulders,down arms occ  . Shortness of breath   . Hx of echocardiogram 04/04/2010    showed a borderline dilatd left ventricle with mild left ventricle hypertrophy and an EF 50-55% doppler suggested diastolic dysfunction, although the pattern is probably normal for an 82 yrs old. The left atrium was indeed mildly dilated at 42 mm, but there are no significant valvular abnormalities.   . Hyperlipidemia   . Myocardial infarction [facility]) Oct. 11, 2016    Heart Attack    ROS:   All systems reviewed and negative except as noted in the HPI.   Past  Surgical History  Procedure Laterality Date  . Rotator cuff repair  2003    left arm  . Abdominal aortic aneurysm repair  02/24/2009    AAA Stenting  . Hernia repair Bilateral   . Hand surgery Right     tendon, lft 90's  . Endarterectomy Right 10/27/2013    Procedure: ENDARTERECTOMY CAROTID;  Surgeon: Elam Dutch, MD;  Location: [place];  Service: Vascular;  Laterality: Right;  . Melanoma removed    . Left heart catheterization with coronary angiogram N/A 10/12/2013    Procedure: LEFT HEART CATHETERIZATION WITH CORONARY ANGIOGRAM;  Surgeon: Lorretta Harp, MD;  Location: [facility] CATH LAB;  Service: Cardiovascular;  Laterality: N/A;  . Cardiac catheterization N/A 08/04/2015    Procedure: Right/Left Heart Cath and Coronary Angiography;  Surgeon: Larey Dresser, MD;  Location: [place] CV LAB;  Service: Cardiovascular;  Laterality: N/A;  . Cardiac catheterization N/A 08/04/2015    Procedure: Coronary Stent Intervention;  Surgeon: Burnell Blanks, MD;  Location: [place] CV LAB;  Service: Cardiovascular;  Laterality: N/A;  . Cardiac catheterization N/A 08/08/2015    Procedure: Coronary Stent Intervention;  Surgeon: Peter M Martinique, MD;  Location: [place] CV LAB;  Service: Cardiovascular;  Laterality: N/A;  . Ep implantable device N/A 01/26/2016    Procedure: BiV Pacemaker Insertion CRT-P;  Surgeon: Evans Lance, MD;  Location: [place] CV LAB;  Service: Cardiovascular;  Laterality: N/A;     Family History  Problem Relation Age of Onset  . Heart disease Mother     before age 55  . COPD Mother   . Diabetes Mother   . Hyperlipidemia Mother   . Hypertension Mother      Social History   Social History  . Marital Status: Widowed    Spouse Name: N/A  . Number of Children: N/A  . Years of Education: N/A   Occupational History  . Not on file.   Social History Main Topics  . Smoking status: Former Smoker -- 2.00 packs/day for 30 years    Types: Cigarettes    Quit  date: 10/22/1977  . Smokeless tobacco: Former Systems developer    Quit date: 10/22/1984  . Alcohol Use: 4.2 oz/week    7 Cans of beer per week  . Drug Use: No  . Sexual Activity: Not on file   Other Topics Concern  . Not on file   Social History Narrative     BP 130/60 mmHg  Pulse 55  Ht 5' 10.5" (1.791 m)  Wt 172 lb 12.8 oz (78.382 kg)  BMI 24.44 kg/m2  Physical Exam:  Well appearing elderly man, NAD HEENT: Unremarkable Neck:  No JVD, no thyromegally Lymphatics:  No adenopathy Back:  No CVA tenderness Lungs:  Clear with no wheezes HEART:  Regular rate rhythm, no murmurs, no rubs, no clicks, split S2. Abd:  soft, positive bowel sounds, no organomegally, no rebound, no guarding Ext:  2 plus pulses, no edema, no cyanosis, no clubbing Skin:  No rashes no nodules Neuro:  CN II through XII intact, motor grossly intact  Device interogation: Normal Biv pM function   Assess/Plan: 1. Chronic systolic heart failure - his symptoms are improved. He still has low blood pressure at times.  2. BiV PPM - his St. Jude device is working normally. Will follow. 3. HTN - his blood pressure is well controlled. Will follow.  Lilliauna Van,M.D. 1. Chronic systolic heart failure - his symptoms are class 2-3. He has severe LV dysfunction. I have offered him BiV PPM insertion and he wishes to proceed. 2. CAD - he denies anginal symptoms though he is s/p MI. 3. HTN - his blood pressure is stable. Will follow.  Mikle Bosworth.D.

## 2016-06-05 ENCOUNTER — Other Ambulatory Visit (HOSPITAL_COMMUNITY): Payer: Self-pay | Admitting: Student

## 2016-06-14 ENCOUNTER — Ambulatory Visit (INDEPENDENT_AMBULATORY_CARE_PROVIDER_SITE_OTHER): Payer: Medicare Other | Admitting: Podiatry

## 2016-06-14 ENCOUNTER — Encounter: Payer: Self-pay | Admitting: Podiatry

## 2016-06-14 DIAGNOSIS — M79676 Pain in unspecified toe(s): Secondary | ICD-10-CM

## 2016-06-14 DIAGNOSIS — L84 Corns and callosities: Secondary | ICD-10-CM

## 2016-06-14 DIAGNOSIS — B351 Tinea unguium: Secondary | ICD-10-CM

## 2016-06-14 NOTE — Progress Notes (Signed)
Patient ID: JOBANI ARABIE, male   DOB: 21-Sep-1927, 82 y.o.   MRN: [id number] Complaint:  Visit Type: Patient returns to my office for continued preventative foot care services. Complaint: Patient states" my nails have grown long and thick and become painful to walk and wear shoes"  He presents for preventative foot care services. No changes to ROS  Podiatric Exam: Vascular: dorsalis pedis and posterior tibial pulses are palpable bilateral. Capillary return is immediate. Temperature gradient is WNL. Skin turgor WNL  Sensorium: Normal Semmes Weinstein monofilament test. Normal tactile sensation bilaterally. Nail Exam: Pt has thick disfigured discolored nails with subungual debris noted bilateral entire nail hallux through fifth toenails Ulcer Exam: There is no evidence of ulcer or pre-ulcerative changes or infection. Orthopedic Exam: Muscle tone and strength are WNL. No limitations in general ROM. No crepitus or effusions noted. Foot type and digits show no abnormalities. Bony prominences are unremarkable. Skin: No Porokeratosis. No infection or ulcers. Distal Clavi second toe right.  Diagnosis:  Tinea unguium, Pain in right toe, pain in left toes, Second toe clavi right  Treatment & Plan Procedures and Treatment: Consent by patient was obtained for treatment procedures. The patient understood the discussion of treatment and procedures well. All questions were answered thoroughly reviewed. Debridement of mycotic and hypertrophic toenails, 1 through 5 bilateral and clearing of subungual debris. No ulceration, no infection noted. Debride clavi. Return Visit-Office Procedure: Patient instructed to return to the office for a follow up visit 3 months for continued evaluation and treatment.   Gardiner Barefoot DPM

## 2016-08-01 ENCOUNTER — Telehealth: Payer: Self-pay | Admitting: Internal Medicine

## 2016-08-01 NOTE — Telephone Encounter (Signed)
New message.  Pt call requesting to speak with RN. Pt would like to know how he will need to send his transmission on 10/12. Please call back to discuss

## 2016-08-01 NOTE — Telephone Encounter (Signed)
Spoke w/ pt and gave him my number and he will call me in the morning so I can help him with his home monitor. Pt verbalized understanding.

## 2016-08-02 ENCOUNTER — Ambulatory Visit (INDEPENDENT_AMBULATORY_CARE_PROVIDER_SITE_OTHER): Payer: Medicare Other | Admitting: *Deleted

## 2016-08-02 DIAGNOSIS — I5022 Chronic systolic (congestive) heart failure: Secondary | ICD-10-CM | POA: Diagnosis not present

## 2016-08-02 NOTE — Progress Notes (Signed)
Remote pacemaker transmission.   

## 2016-08-03 ENCOUNTER — Encounter: Payer: Self-pay | Admitting: Cardiology

## 2016-08-03 DIAGNOSIS — Z23 Encounter for immunization: Secondary | ICD-10-CM | POA: Diagnosis not present

## 2016-08-16 ENCOUNTER — Encounter: Payer: Self-pay | Admitting: Podiatry

## 2016-08-16 ENCOUNTER — Ambulatory Visit (INDEPENDENT_AMBULATORY_CARE_PROVIDER_SITE_OTHER): Payer: Medicare Other | Admitting: Podiatry

## 2016-08-16 VITALS — Ht 70.5 in | Wt 172.0 lb

## 2016-08-16 DIAGNOSIS — L84 Corns and callosities: Secondary | ICD-10-CM | POA: Diagnosis not present

## 2016-08-16 DIAGNOSIS — B351 Tinea unguium: Secondary | ICD-10-CM

## 2016-08-16 DIAGNOSIS — M79676 Pain in unspecified toe(s): Secondary | ICD-10-CM | POA: Diagnosis not present

## 2016-08-16 NOTE — Progress Notes (Signed)
Patient ID: Henry Neal, male   DOB: June 03, 1927, 82 y.o.   MRN: [id number] Complaint:  Visit Type: Patient returns to my office for continued preventative foot care services. Complaint: Patient states" my nails have grown long and thick and become painful to walk and wear shoes"  He presents for preventative foot care services. No changes to ROS  Podiatric Exam: Vascular: dorsalis pedis and posterior tibial pulses are palpable bilateral. Capillary return is immediate. Temperature gradient is WNL. Skin turgor WNL  Sensorium: Normal Semmes Weinstein monofilament test. Normal tactile sensation bilaterally. Nail Exam: Pt has thick disfigured discolored nails with subungual debris noted bilateral entire nail hallux through fifth toenails Ulcer Exam: There is no evidence of ulcer or pre-ulcerative changes or infection. Orthopedic Exam: Muscle tone and strength are WNL. No limitations in general ROM. No crepitus or effusions noted. Foot type and digits show no abnormalities. Bony prominences are unremarkable. Skin: No Porokeratosis. No infection or ulcers. Distal Clavi second toe right.  Diagnosis:  Tinea unguium, Pain in right toe, pain in left toes, Second toe clavi right  Treatment & Plan Procedures and Treatment: Consent by patient was obtained for treatment procedures. The patient understood the discussion of treatment and procedures well. All questions were answered thoroughly reviewed. Debridement of mycotic and hypertrophic toenails, 1 through 5 bilateral and clearing of subungual debris. No ulceration, no infection noted. Debride clavi. Prescribed powerstep insole.Return Visit-Office Procedure: Patient instructed to return to the office for a follow up visit 3 months for continued evaluation and treatment.   Gardiner Barefoot DPM

## 2016-08-19 ENCOUNTER — Other Ambulatory Visit (HOSPITAL_COMMUNITY): Payer: Self-pay | Admitting: Cardiology

## 2016-09-03 LAB — CUP PACEART REMOTE DEVICE CHECK
Battery Remaining Longevity: 123 mo
Brady Statistic AP VP Percent: 13 %
Brady Statistic AP VS Percent: 1 %
Brady Statistic AS VP Percent: 86 %
Brady Statistic AS VS Percent: 1 %
Brady Statistic RA Percent Paced: 12 %
Implantable Lead Location: 753858
Implantable Lead Location: 753860
Implantable Pulse Generator Implant Date: 20170406
Lead Channel Impedance Value: 460 Ohm
Lead Channel Impedance Value: 650 Ohm
Lead Channel Impedance Value: 910 Ohm
Lead Channel Pacing Threshold Amplitude: 0.5 V
Lead Channel Pacing Threshold Amplitude: 0.875 V
Lead Channel Pacing Threshold Pulse Width: 0.4 ms
Lead Channel Sensing Intrinsic Amplitude: 12 mV
Lead Channel Setting Pacing Amplitude: 1.375
Lead Channel Setting Pacing Amplitude: 2 V
Lead Channel Setting Pacing Pulse Width: 0.4 ms
Lead Channel Setting Sensing Sensitivity: 2 mV
MDC IDC LEAD IMPLANT DT: 20170406
MDC IDC LEAD IMPLANT DT: 20170406
MDC IDC LEAD IMPLANT DT: 20170406
MDC IDC LEAD LOCATION: 753859
MDC IDC MSMT BATTERY REMAINING PERCENTAGE: 95.5 %
MDC IDC MSMT BATTERY VOLTAGE: 3.01 V
MDC IDC MSMT LEADCHNL LV PACING THRESHOLD PULSEWIDTH: 0.4 ms
MDC IDC MSMT LEADCHNL RA PACING THRESHOLD AMPLITUDE: 0.375 V
MDC IDC MSMT LEADCHNL RA SENSING INTR AMPL: 2.3 mV
MDC IDC MSMT LEADCHNL RV PACING THRESHOLD PULSEWIDTH: 0.4 ms
MDC IDC PG SERIAL: 7866838
MDC IDC SESS DTM: 20171012123707
MDC IDC SET LEADCHNL LV PACING PULSEWIDTH: 0.4 ms
MDC IDC SET LEADCHNL RV PACING AMPLITUDE: 2 V

## 2016-09-03 NOTE — Progress Notes (Signed)
Normal remote reviewed.  Next Merlin 11/01/16

## 2016-09-12 DIAGNOSIS — Z0000 Encounter for general adult medical examination without abnormal findings: Secondary | ICD-10-CM | POA: Diagnosis not present

## 2016-09-12 DIAGNOSIS — I2510 Atherosclerotic heart disease of native coronary artery without angina pectoris: Secondary | ICD-10-CM | POA: Diagnosis not present

## 2016-09-12 DIAGNOSIS — R39198 Other difficulties with micturition: Secondary | ICD-10-CM | POA: Diagnosis not present

## 2016-09-12 DIAGNOSIS — M4850XD Collapsed vertebra, not elsewhere classified, site unspecified, subsequent encounter for fracture with routine healing: Secondary | ICD-10-CM | POA: Diagnosis not present

## 2016-09-12 DIAGNOSIS — E782 Mixed hyperlipidemia: Secondary | ICD-10-CM | POA: Diagnosis not present

## 2016-09-20 DIAGNOSIS — E782 Mixed hyperlipidemia: Secondary | ICD-10-CM | POA: Diagnosis not present

## 2016-09-20 DIAGNOSIS — I2510 Atherosclerotic heart disease of native coronary artery without angina pectoris: Secondary | ICD-10-CM | POA: Diagnosis not present

## 2016-09-20 DIAGNOSIS — R7303 Prediabetes: Secondary | ICD-10-CM | POA: Diagnosis not present

## 2016-09-20 DIAGNOSIS — I779 Disorder of arteries and arterioles, unspecified: Secondary | ICD-10-CM | POA: Diagnosis not present

## 2016-09-27 ENCOUNTER — Ambulatory Visit (HOSPITAL_COMMUNITY)
Admission: RE | Admit: 2016-09-27 | Discharge: 2016-09-27 | Disposition: A | Discharge status: Home / Self Care | Payer: Medicare Other | Source: Ambulatory Visit | Attending: Internal Medicine | Admitting: Internal Medicine

## 2016-09-27 VITALS — BP 130/62 | Resp 18

## 2016-09-27 DIAGNOSIS — R030 Elevated blood-pressure reading, without diagnosis of hypertension: Secondary | ICD-10-CM | POA: Insufficient documentation

## 2016-09-27 DIAGNOSIS — I5022 Chronic systolic (congestive) heart failure: Secondary | ICD-10-CM

## 2016-09-27 NOTE — Progress Notes (Signed)
Patient walked in to CHF clinic asking for BP check. States he did not receive call back from triage and was concerned of home BP readings on his machine of 170s SBP. Patient feels fine, no complaints otherwise. Vitals checked in clinic, manual BP 130/62.  Home monitor checked in clinic, reading 142/64. Apt made to follow up with Aundra Dubin in January as he has not been since since 01/2016.  Renee Pain, RN

## 2016-10-06 ENCOUNTER — Other Ambulatory Visit (HOSPITAL_COMMUNITY): Payer: Self-pay | Admitting: Cardiology

## 2016-10-29 DIAGNOSIS — M65342 Trigger finger, left ring finger: Secondary | ICD-10-CM | POA: Insufficient documentation

## 2016-10-31 ENCOUNTER — Ambulatory Visit (HOSPITAL_COMMUNITY)
Admission: RE | Admit: 2016-10-31 | Discharge: 2016-10-31 | Disposition: A | Discharge status: Home / Self Care | Payer: Medicare Other | Source: Ambulatory Visit | Attending: Cardiology | Admitting: Cardiology

## 2016-10-31 VITALS — BP 134/51 | HR 56 | Wt 170.2 lb

## 2016-10-31 DIAGNOSIS — I739 Peripheral vascular disease, unspecified: Secondary | ICD-10-CM | POA: Diagnosis not present

## 2016-10-31 DIAGNOSIS — I252 Old myocardial infarction: Secondary | ICD-10-CM | POA: Insufficient documentation

## 2016-10-31 DIAGNOSIS — Z8249 Family history of ischemic heart disease and other diseases of the circulatory system: Secondary | ICD-10-CM | POA: Insufficient documentation

## 2016-10-31 DIAGNOSIS — I7300 Raynaud's syndrome without gangrene: Secondary | ICD-10-CM | POA: Diagnosis not present

## 2016-10-31 DIAGNOSIS — E784 Other hyperlipidemia: Secondary | ICD-10-CM | POA: Diagnosis not present

## 2016-10-31 DIAGNOSIS — Z950 Presence of cardiac pacemaker: Secondary | ICD-10-CM | POA: Insufficient documentation

## 2016-10-31 DIAGNOSIS — N189 Chronic kidney disease, unspecified: Secondary | ICD-10-CM | POA: Insufficient documentation

## 2016-10-31 DIAGNOSIS — E7849 Other hyperlipidemia: Secondary | ICD-10-CM

## 2016-10-31 DIAGNOSIS — I714 Abdominal aortic aneurysm, without rupture: Secondary | ICD-10-CM | POA: Insufficient documentation

## 2016-10-31 DIAGNOSIS — I6523 Occlusion and stenosis of bilateral carotid arteries: Secondary | ICD-10-CM | POA: Diagnosis not present

## 2016-10-31 DIAGNOSIS — I2582 Chronic total occlusion of coronary artery: Secondary | ICD-10-CM | POA: Diagnosis not present

## 2016-10-31 DIAGNOSIS — Z7982 Long term (current) use of aspirin: Secondary | ICD-10-CM | POA: Insufficient documentation

## 2016-10-31 DIAGNOSIS — I255 Ischemic cardiomyopathy: Secondary | ICD-10-CM | POA: Insufficient documentation

## 2016-10-31 DIAGNOSIS — I5022 Chronic systolic (congestive) heart failure: Secondary | ICD-10-CM | POA: Diagnosis not present

## 2016-10-31 DIAGNOSIS — Z7902 Long term (current) use of antithrombotics/antiplatelets: Secondary | ICD-10-CM | POA: Diagnosis not present

## 2016-10-31 DIAGNOSIS — Z87891 Personal history of nicotine dependence: Secondary | ICD-10-CM | POA: Insufficient documentation

## 2016-10-31 DIAGNOSIS — I2510 Atherosclerotic heart disease of native coronary artery without angina pectoris: Secondary | ICD-10-CM | POA: Insufficient documentation

## 2016-10-31 LAB — BASIC METABOLIC PANEL
ANION GAP: 6 (ref 5–15)
BUN: 14 mg/dL (ref 6–20)
CO2: 28 mmol/L (ref 22–32)
Calcium: 9.2 mg/dL (ref 8.9–10.3)
Chloride: 104 mmol/L (ref 101–111)
Creatinine, Ser: 0.9 mg/dL (ref 0.61–1.24)
GFR calc Af Amer: >60 mL/min (ref >60)
GFR calc non Af Amer: >60 mL/min (ref >60)
GLUCOSE: 107 mg/dL — AB (ref 65–99)
POTASSIUM: 3.8 mmol/L (ref 3.5–5.1)
Sodium: 138 mmol/L (ref 135–145)

## 2016-10-31 LAB — LIPID PANEL
CHOL/HDL RATIO: 1.8 ratio
Cholesterol: 109 mg/dL (ref 0–200)
HDL: 59 mg/dL (ref >40)
LDL CALC: 37 mg/dL (ref 0–99)
Triglycerides: 64 mg/dL (ref <150)
VLDL: 13 mg/dL (ref 0–40)

## 2016-10-31 MED ORDER — SACUBITRIL-VALSARTAN 24-26 MG PO TABS: 1.0000 | ORAL_TABLET | Freq: Two times a day (BID) | ORAL | 3 refills | Status: DC

## 2016-10-31 NOTE — Patient Instructions (Signed)
Stop Lisinopril  Start Entresto 24/26 mg Twice daily STARTING Friday 1/12 AM  Labs today  Labs in 10 days  Your physician has requested that you have an echocardiogram. Echocardiography is a painless test that uses sound waves to create images of your heart. It provides your doctor with information about the size and shape of your heart and how well your heart's chambers and valves are working. This procedure takes approximately one hour. There are no restrictions for this procedure.  You have been referred to Cardiac Rehab Maintenance Program  Your physician recommends that you schedule a follow-up appointment in: 3 months

## 2016-10-31 NOTE — Progress Notes (Signed)
Patient ID: Henry Neal, male   DOB: [birth year], 83 y.o.   MRN: [id number] PCP: Dr. Ashby Dawes Cardiology: Dr. Aundra Dubin  83 yo with history of CAD, chronic systolic CHF, LBBB, AAA s/p repair presents for cardiology followup.  He had a cardiac cath in 12/15, showing total occlusion of the LAD and 50-60% proximal RCA.  EF at that time was near-normal.  In 10/16, he developed dyspnea and epigastric pain.  He was noted to be in acute pulmonary edema and was diuresed.  Troponin was only mildly elevated at 0.47.  He ultimately underwent LHC showing hazy 80-90% RCA stenosis which was likely the culprit for his presentation.  Additionally, the LAD was only subtotally occluded on this cath.  He had DES to RCA.  Cardiac MRI was done, showing EF 22% with significant viability in LAD territory.  Therefore, CTO of LAD was opened with DES.  Echo in 2/17 showed EF persistently low at 15%.  He had St Jude CRT-P placement in 4/17.   Mr Cheatam is doing well today.  Dyspnea only with heavy activity.  Still working full time.  Rare atypical chest pain.  No palpitations.  No orthopnea/PND.  Main problem now is low back pain and hip pain.  No lightheadedness.  Weight stable.  Has some Raynaud's-type symptoms.   ECG: NSR, BiV paced  Labs (10/16): hgb 9.7, K 4.6, creatinine 1.12 Labs (11/16): K 5.9, creatinine 1.5 Labs (12/16); K 4.2, creatinine 1.07 => 1.02, BNP 448 Labs (1/17): K 4.1, creatinine 1.13, BNP 423, LDL 40, HDL 47 Labs (4/17): K 3.9, creatinine 1.03, HCT 32.6 Labs (5/17): K 4.4, creatinine 1.09  PMH: 1. CKD 2. AAA s/p repair, followed at [facility]. 3. Carotid stenosis: s/p right CEA.   Carotid dopplers (2/16) with right CEA stable, < [id number] LICA stenosis.  Followed at [facility].  4. Chronic LBBB 5. LAD: LHC (12/15) with totally occluded LAD, 50-60% pRCA => medically managed.  10/16 admitted with NSTEMI.  LHC with subtotalled LAD occlusion, 80-90% pRCA with thrombus => DES RCA initially.  Cardiac MRI was done showing  viability in the LAD territory, so he had DES to the CTO LAD.   6. Chronic systolic CHF: Ischemic cardiomyopathy.  Echo (10/16) with EF 15% with wall motion abnormalities, moderately dilated RV with normal systolic function.  CMRI (10/16) with severe LV dilation, EF 22% with septal-lateral dyssynchrony, no LV thrombus, normal RV size/systolic function => significant viability noted in the LAD territory.  Echo (2/17): EF 15%, severe LV dilation, wall motion abnormalities noted, mild MR, mildly dilated RV with normal systolic function, PASP 55 mmHg.  St Jude CRT-P 4/17.  7. ABIs (10/16) were normal.  8. Raynaud's syndrome.  9. 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   Current Outpatient Prescriptions  Medication Sig Dispense Refill  . acetaminophen (TYLENOL) 500 MG tablet Take 1,000 mg by mouth daily as needed for mild pain or headache. Reported on 01/25/2016    . aspirin EC 81 MG tablet Take 81 mg by mouth daily.      Marland Kitchen atorvastatin (LIPITOR) 40 MG tablet Take 1 tablet (40 mg total) by mouth daily at 6 PM. 30 tablet 6  . Calcium Carbonate-Vitamin D (CALCIUM 600 + D PO) Take 1 tablet by mouth 2 (two) times daily.     . carvedilol (COREG) 6.25 MG tablet take 1 tablet by mouth twice a  day 180 tablet 3  . clopidogrel (PLAVIX) 75 MG tablet take 1 tablet by mouth once daily WITH BREAKFAST 90 tablet 3  . finasteride (PROSCAR) 5 MG tablet take 1 tablet by mouth once daily    . fluticasone (FLONASE) 50 MCG/ACT nasal spray Place 1 spray into both nostrils daily as needed for allergies.     Marland Kitchen gabapentin (NEURONTIN) 300 MG capsule Take 300 mg by mouth 2 (two) times daily.  0  . ketoconazole (NIZORAL) 2 % cream Apply to face daily as needed  0  . meclizine (ANTIVERT) 25 MG tablet Take 25 mg by mouth 2 (two) times daily as needed for dizziness.     . mometasone (ELOCON) 0.1 % cream Apply 1 application topically  daily as needed (for irritation).     . Multiple Vitamin (MULTIVITAMIN PO) Take 1 tablet by mouth daily. ALIVE men's vitamin    . NITROSTAT 0.4 MG SL tablet place 1 tablet under the tongue if needed every 5 minutes for chest pain CALL 911 AFTER 3 DOSES 25 tablet 10  . pantoprazole (PROTONIX) 40 MG tablet Take 40 mg by mouth daily.  0  . spironolactone (ALDACTONE) 25 MG tablet Take 0.5 tablets (12.5 mg total) by mouth daily. 30 tablet 3  . sacubitril-valsartan (ENTRESTO) 24-26 MG Take 1 tablet by mouth 2 (two) times daily. 60 tablet 3  . triamcinolone cream (KENALOG) 0.1 % Apply 1 application topically 2 (two) times daily as needed (skin).   0   No current facility-administered medications for this encounter.    BP (!) 134/51   Pulse (!) 56   Wt 170 lb 4 oz (77.2 kg)   SpO2 100%   BMI 24.08 kg/m  General: NAD Neck: No JVD, no thyromegaly or thyroid nodule.  Lungs: Clear to auscultation bilaterally with normal respiratory effort. CV: Nondisplaced PMI.  Heart regular S1/S2, no S3/S4, no murmur.  Trace ankle edema.  R carotid bruit.  Normal pedal pulses.  Abdomen: Soft, nontender, no hepatosplenomegaly, no distention.  Skin: Intact without lesions or rashes.  Neurologic: Alert and oriented x 3.  Psych: Normal affect. Extremities: No clubbing or cyanosis.  HEENT: Normal.   Assessment/Plan: 1. CAD: NSTEMI with DES to RCA (culprit vessel) and subsequent DES to CTO of LAD (cMRI showed viability in the LAD distribution) in 10/16.  No exertional chest pain.  - Continue ASA 81 and Plavix, will continue Plavix long-term. - Continue statin, check lipids today.  - Refer to maintenance cardiac rehab.   2. Chronic systolic CHF: Ischemic cardiomyopathy.  NYHA class II symptoms.  He is not volume overloaded on exam.  EF is persistently low, 15% on echo in 2/17.   He had St Jude CRT-P placed in 4/17.  - Continue Coreg 6.25 mg bid and spironolactone 25 daily.  - I will have him stop lisinopril and  after 36 hours start Entresto 24/26 bid.  BMET today and again in 10 days.  - I will get an echo to look for improvement with CRT.  3. Carotid stenosis: Has followup at [facility].   Loralie Champagne 10/31/2016

## 2016-11-01 ENCOUNTER — Telehealth: Payer: Self-pay | Admitting: Internal Medicine

## 2016-11-01 ENCOUNTER — Ambulatory Visit (INDEPENDENT_AMBULATORY_CARE_PROVIDER_SITE_OTHER): Payer: Medicare Other | Admitting: *Deleted

## 2016-11-01 DIAGNOSIS — I5022 Chronic systolic (congestive) heart failure: Secondary | ICD-10-CM

## 2016-11-01 NOTE — Telephone Encounter (Signed)
Called, spoke with pt. Pt stated he felt his heart skipping beats yesterday afternoon, after his appt with Dr. Aundra Dubin. Pt stated the left side of the neck has been sore for the past year. Pt denied CP or SOB. Will forward to Dr. Lovena Le and Dr. Aundra Dubin to advise.

## 2016-11-01 NOTE — Telephone Encounter (Signed)
Calld, spoke with pt. Pt stated he is feeling better than before. Informed Dr. Aundra Dubin stated pt may be having some PVCs. No intervention at this time. Informed pt should call back if symptoms get worse. Pt verbalized understanding.

## 2016-11-01 NOTE — Telephone Encounter (Signed)
Henry Neal is calling to report that his heart has been skipping more than usual, he also states that he has neck pain. He states that when he neck hurts that's when he notices the skipping. This has been going since yesterday afternoon.  Please call, thanks.

## 2016-11-01 NOTE — Telephone Encounter (Signed)
Remote transmission received 11/01/16 at 4:05am that demonstrates sinus rhythm with biventricular pacing. No episodes, < 1% PVC burden, normal sensing and thresholds.

## 2016-11-01 NOTE — Telephone Encounter (Signed)
He was in sinus rhythm in the office yesterday and on device interrogation.  May have some PVCs.  No intervention needed that I can tell.

## 2016-11-01 NOTE — Progress Notes (Signed)
Remote pacemaker transmission.   

## 2016-11-02 ENCOUNTER — Encounter: Payer: Self-pay | Admitting: Cardiology

## 2016-11-08 ENCOUNTER — Other Ambulatory Visit (HOSPITAL_COMMUNITY): Payer: Self-pay

## 2016-11-09 ENCOUNTER — Other Ambulatory Visit (HOSPITAL_COMMUNITY): Payer: Self-pay

## 2016-11-09 ENCOUNTER — Ambulatory Visit (HOSPITAL_COMMUNITY)
Admission: RE | Admit: 2016-11-09 | Discharge: 2016-11-09 | Disposition: A | Discharge status: Home / Self Care | Payer: Medicare Other | Source: Ambulatory Visit | Attending: Cardiology | Admitting: Cardiology

## 2016-11-09 DIAGNOSIS — I5022 Chronic systolic (congestive) heart failure: Secondary | ICD-10-CM | POA: Insufficient documentation

## 2016-11-09 LAB — BASIC METABOLIC PANEL
ANION GAP: 8 (ref 5–15)
BUN: 19 mg/dL (ref 6–20)
CALCIUM: 9.1 mg/dL (ref 8.9–10.3)
CHLORIDE: 102 mmol/L (ref 101–111)
CO2: 25 mmol/L (ref 22–32)
Creatinine, Ser: 1.06 mg/dL (ref 0.61–1.24)
GFR calc non Af Amer: >60 mL/min (ref >60)
Glucose, Bld: 113 mg/dL — ABNORMAL HIGH (ref 65–99)
Potassium: 4.7 mmol/L (ref 3.5–5.1)
SODIUM: 135 mmol/L (ref 135–145)

## 2016-11-12 DIAGNOSIS — H52223 Regular astigmatism, bilateral: Secondary | ICD-10-CM | POA: Diagnosis not present

## 2016-11-13 ENCOUNTER — Other Ambulatory Visit (HOSPITAL_COMMUNITY): Payer: Self-pay | Admitting: *Deleted

## 2016-11-15 ENCOUNTER — Ambulatory Visit: Payer: Medicare Other | Admitting: Podiatry

## 2016-11-15 LAB — CUP PACEART REMOTE DEVICE CHECK
Brady Statistic AP VP Percent: 13 %
Brady Statistic AP VS Percent: 1 %
Brady Statistic AS VP Percent: 86 %
Brady Statistic AS VS Percent: 1 %
Brady Statistic RA Percent Paced: 12 %
Date Time Interrogation Session: 20180111090521
Implantable Lead Implant Date: 20170406
Implantable Lead Implant Date: 20170406
Implantable Lead Location: 753858
Implantable Lead Location: 753859
Lead Channel Impedance Value: 630 Ohm
Lead Channel Impedance Value: 910 Ohm
Lead Channel Pacing Threshold Amplitude: 0.5 V
Lead Channel Pacing Threshold Amplitude: 0.625 V
Lead Channel Pacing Threshold Pulse Width: 0.4 ms
Lead Channel Pacing Threshold Pulse Width: 0.4 ms
Lead Channel Pacing Threshold Pulse Width: 0.4 ms
Lead Channel Sensing Intrinsic Amplitude: 12 mV
Lead Channel Setting Pacing Amplitude: 2 V
Lead Channel Setting Pacing Amplitude: 2 V
Lead Channel Setting Sensing Sensitivity: 2 mV
MDC IDC LEAD IMPLANT DT: 20170406
MDC IDC LEAD LOCATION: 753860
MDC IDC MSMT BATTERY REMAINING LONGEVITY: 121 mo
MDC IDC MSMT BATTERY REMAINING PERCENTAGE: 95.5 %
MDC IDC MSMT BATTERY VOLTAGE: 3.01 V
MDC IDC MSMT LEADCHNL RA IMPEDANCE VALUE: 480 Ohm
MDC IDC MSMT LEADCHNL RA PACING THRESHOLD AMPLITUDE: 0.5 V
MDC IDC MSMT LEADCHNL RA SENSING INTR AMPL: 2.5 mV
MDC IDC PG IMPLANT DT: 20170406
MDC IDC SET LEADCHNL LV PACING PULSEWIDTH: 0.4 ms
MDC IDC SET LEADCHNL RA PACING AMPLITUDE: 1.5 V
MDC IDC SET LEADCHNL RV PACING PULSEWIDTH: 0.4 ms
Pulse Gen Serial Number: 7866838

## 2016-11-16 ENCOUNTER — Other Ambulatory Visit: Payer: Self-pay

## 2016-11-16 ENCOUNTER — Ambulatory Visit (INDEPENDENT_AMBULATORY_CARE_PROVIDER_SITE_OTHER): Payer: Medicare Other | Admitting: Podiatry

## 2016-11-16 ENCOUNTER — Ambulatory Visit (HOSPITAL_COMMUNITY): Payer: Medicare Other | Attending: Cardiovascular Disease

## 2016-11-16 ENCOUNTER — Encounter: Payer: Self-pay | Admitting: Podiatry

## 2016-11-16 VITALS — Ht 70.5 in | Wt 172.0 lb

## 2016-11-16 DIAGNOSIS — M79676 Pain in unspecified toe(s): Secondary | ICD-10-CM | POA: Diagnosis not present

## 2016-11-16 DIAGNOSIS — L84 Corns and callosities: Secondary | ICD-10-CM | POA: Diagnosis not present

## 2016-11-16 DIAGNOSIS — I340 Nonrheumatic mitral (valve) insufficiency: Secondary | ICD-10-CM | POA: Diagnosis not present

## 2016-11-16 DIAGNOSIS — I739 Peripheral vascular disease, unspecified: Secondary | ICD-10-CM

## 2016-11-16 DIAGNOSIS — I5022 Chronic systolic (congestive) heart failure: Secondary | ICD-10-CM | POA: Insufficient documentation

## 2016-11-16 DIAGNOSIS — B351 Tinea unguium: Secondary | ICD-10-CM

## 2016-11-16 LAB — ECHOCARDIOGRAM COMPLETE
Ao-asc: 35 cm
CHL CUP TV REG PEAK VELOCITY: 236 cm/s
E decel time: 257 msec
EERAT: 16.49
FS: 27 % — AB (ref 28–44)
Height: 70.5 in
IVS/LV PW RATIO, ED: 0.89
LA diam end sys: 39 mm
LA diam index: 1.99 cm/m2
LA vol: 30.4 mL
LASIZE: 39 mm
LAVOLA4C: 20.9 mL
LAVOLIN: 15.5 mL/m2
LV E/e'average: 16.49
LV TDI E'LATERAL: 3.59
LVEEMED: 16.49
LVELAT: 3.59 cm/s
LVOT VTI: 19.8 cm
LVOT area: 4.52 cm2
LVOT diameter: 24 mm
LVOTPV: 94.6 cm/s
LVOTSV: 89 mL
MV Dec: 257
MV pk A vel: 85.4 m/s
MVPKEVEL: 59.2 m/s
PW: 11.6 mm — AB (ref 0.6–1.1)
RV LATERAL S' VELOCITY: 12.4 cm/s
RV TAPSE: 20.7 mm
TDI e' medial: 4.24
TR max vel: 236 cm/s
Weight: 2752 oz

## 2016-11-16 NOTE — Progress Notes (Signed)
Patient ID: Henry Neal, male   DOB: [birth year], 83 y.o.   MRN: [id number] Complaint:  Visit Type: Patient returns to my office for continued preventative foot care services. Complaint: Patient states" my nails have grown long and thick and become painful to walk and wear shoes"  He presents for preventative foot care services. No changes to ROS  Podiatric Exam: Vascular: dorsalis pedis and posterior tibial pulses are palpable bilateral. Capillary return is immediate. Temperature gradient is WNL. Skin turgor WNL  Sensorium: Normal Semmes Weinstein monofilament test. Normal tactile sensation bilaterally. Nail Exam: Pt has thick disfigured discolored nails with subungual debris noted bilateral entire nail hallux through fifth toenails Ulcer Exam: There is no evidence of ulcer or pre-ulcerative changes or infection. Orthopedic Exam: Muscle tone and strength are WNL. No limitations in general ROM. No crepitus or effusions noted. Foot type and digits show no abnormalities. Bony prominences are unremarkable. Skin: No Porokeratosis. No infection or ulcers. Distal Clavi second toe right.  Diagnosis:  Tinea unguium, Pain in right toe, pain in left toes, Second toe clavi right  Treatment & Plan Procedures and Treatment: Consent by patient was obtained for treatment procedures. The patient understood the discussion of treatment and procedures well. All questions were answered thoroughly reviewed. Debridement of mycotic and hypertrophic toenails, 1 through 5 bilateral and clearing of subungual debris. No ulceration, no infection noted. Debride clavi. Return Visit-Office Procedure: Patient instructed to return to the office for a follow up visit 3 months for continued evaluation and treatment.

## 2016-11-19 DIAGNOSIS — M79642 Pain in left hand: Secondary | ICD-10-CM | POA: Diagnosis not present

## 2016-11-19 DIAGNOSIS — M19042 Primary osteoarthritis, left hand: Secondary | ICD-10-CM | POA: Diagnosis not present

## 2016-11-19 DIAGNOSIS — M545 Low back pain: Secondary | ICD-10-CM | POA: Diagnosis not present

## 2016-11-19 DIAGNOSIS — M19041 Primary osteoarthritis, right hand: Secondary | ICD-10-CM | POA: Diagnosis not present

## 2016-11-19 DIAGNOSIS — I7300 Raynaud's syndrome without gangrene: Secondary | ICD-10-CM | POA: Diagnosis not present

## 2016-11-19 DIAGNOSIS — M79641 Pain in right hand: Secondary | ICD-10-CM | POA: Diagnosis not present

## 2016-11-27 ENCOUNTER — Encounter (HOSPITAL_COMMUNITY)
Admission: RE | Admit: 2016-11-27 | Discharge: 2016-11-27 | Disposition: A | Discharge status: Home / Self Care | Payer: Self-pay | Source: Ambulatory Visit | Attending: Cardiology | Admitting: Cardiology

## 2016-11-27 DIAGNOSIS — I5022 Chronic systolic (congestive) heart failure: Secondary | ICD-10-CM | POA: Insufficient documentation

## 2016-11-29 ENCOUNTER — Encounter (HOSPITAL_COMMUNITY)
Admission: RE | Admit: 2016-11-29 | Discharge: 2016-11-29 | Disposition: A | Discharge status: Home / Self Care | Payer: Self-pay | Source: Ambulatory Visit | Attending: Cardiology | Admitting: Cardiology

## 2016-11-30 ENCOUNTER — Telehealth (HOSPITAL_COMMUNITY): Payer: Self-pay | Admitting: Pharmacist

## 2016-11-30 ENCOUNTER — Encounter (HOSPITAL_COMMUNITY)
Admission: RE | Admit: 2016-11-30 | Discharge: 2016-11-30 | Disposition: A | Discharge status: Home / Self Care | Payer: Self-pay | Source: Ambulatory Visit | Attending: Cardiology | Admitting: Cardiology

## 2016-11-30 DIAGNOSIS — S0011XA Contusion of right eyelid and periocular area, initial encounter: Secondary | ICD-10-CM | POA: Diagnosis not present

## 2016-11-30 NOTE — Telephone Encounter (Signed)
Entresto 24-26 mg BID PA approved by [employer] Part D through 11/29/17.   Ruta Hinds. Velva Harman, PharmD, BCPS, CPP Clinical Pharmacist Pager: [phone] Phone: [phone] 11/30/2016 9:29 AM

## 2016-12-03 DIAGNOSIS — H02115 Cicatricial ectropion of left lower eyelid: Secondary | ICD-10-CM | POA: Diagnosis not present

## 2016-12-03 DIAGNOSIS — H02535 Eyelid retraction left lower eyelid: Secondary | ICD-10-CM | POA: Diagnosis not present

## 2016-12-03 DIAGNOSIS — H02112 Cicatricial ectropion of right lower eyelid: Secondary | ICD-10-CM | POA: Diagnosis not present

## 2016-12-03 DIAGNOSIS — H02532 Eyelid retraction right lower eyelid: Secondary | ICD-10-CM | POA: Diagnosis not present

## 2016-12-04 ENCOUNTER — Encounter (HOSPITAL_COMMUNITY)
Admission: RE | Admit: 2016-12-04 | Discharge: 2016-12-04 | Disposition: A | Discharge status: Home / Self Care | Payer: Self-pay | Source: Ambulatory Visit | Attending: Cardiology | Admitting: Cardiology

## 2016-12-06 ENCOUNTER — Encounter (HOSPITAL_COMMUNITY)
Admission: RE | Admit: 2016-12-06 | Discharge: 2016-12-06 | Disposition: A | Discharge status: Home / Self Care | Payer: Self-pay | Source: Ambulatory Visit | Attending: Cardiology | Admitting: Cardiology

## 2016-12-07 ENCOUNTER — Encounter (HOSPITAL_COMMUNITY)
Admission: RE | Admit: 2016-12-07 | Discharge: 2016-12-07 | Disposition: A | Discharge status: Home / Self Care | Payer: Self-pay | Source: Ambulatory Visit | Attending: Cardiology | Admitting: Cardiology

## 2016-12-11 ENCOUNTER — Encounter (HOSPITAL_COMMUNITY)
Admission: RE | Admit: 2016-12-11 | Discharge: 2016-12-11 | Disposition: A | Discharge status: Home / Self Care | Payer: Self-pay | Source: Ambulatory Visit | Attending: Cardiology | Admitting: Cardiology

## 2016-12-13 ENCOUNTER — Encounter (HOSPITAL_COMMUNITY): Payer: Self-pay

## 2016-12-14 ENCOUNTER — Encounter (HOSPITAL_COMMUNITY)
Admission: RE | Admit: 2016-12-14 | Discharge: 2016-12-14 | Disposition: A | Discharge status: Home / Self Care | Payer: Self-pay | Source: Ambulatory Visit | Attending: Cardiology | Admitting: Cardiology

## 2016-12-18 ENCOUNTER — Encounter (HOSPITAL_COMMUNITY): Payer: Self-pay

## 2016-12-18 DIAGNOSIS — R600 Localized edema: Secondary | ICD-10-CM | POA: Diagnosis not present

## 2016-12-18 DIAGNOSIS — I7300 Raynaud's syndrome without gangrene: Secondary | ICD-10-CM | POA: Diagnosis not present

## 2016-12-18 DIAGNOSIS — M19042 Primary osteoarthritis, left hand: Secondary | ICD-10-CM | POA: Diagnosis not present

## 2016-12-18 DIAGNOSIS — M19041 Primary osteoarthritis, right hand: Secondary | ICD-10-CM | POA: Diagnosis not present

## 2016-12-20 ENCOUNTER — Encounter: Payer: Self-pay | Admitting: Family

## 2016-12-20 ENCOUNTER — Encounter (HOSPITAL_COMMUNITY)
Admission: RE | Admit: 2016-12-20 | Discharge: 2016-12-20 | Disposition: A | Discharge status: Home / Self Care | Payer: Self-pay | Source: Ambulatory Visit | Attending: Cardiology | Admitting: Cardiology

## 2016-12-20 DIAGNOSIS — I5022 Chronic systolic (congestive) heart failure: Secondary | ICD-10-CM | POA: Insufficient documentation

## 2016-12-21 ENCOUNTER — Encounter (HOSPITAL_COMMUNITY)
Admission: RE | Admit: 2016-12-21 | Discharge: 2016-12-21 | Disposition: A | Discharge status: Home / Self Care | Payer: Self-pay | Source: Ambulatory Visit | Attending: Cardiology | Admitting: Cardiology

## 2016-12-25 ENCOUNTER — Encounter (HOSPITAL_COMMUNITY): Payer: Self-pay

## 2016-12-27 ENCOUNTER — Encounter: Payer: Self-pay | Admitting: Family

## 2016-12-27 ENCOUNTER — Ambulatory Visit (INDEPENDENT_AMBULATORY_CARE_PROVIDER_SITE_OTHER)
Admission: RE | Admit: 2016-12-27 | Discharge: 2016-12-27 | Disposition: A | Discharge status: Home / Self Care | Payer: Medicare Other | Source: Ambulatory Visit | Attending: Family | Admitting: Family

## 2016-12-27 ENCOUNTER — Ambulatory Visit (HOSPITAL_COMMUNITY)
Admission: RE | Admit: 2016-12-27 | Discharge: 2016-12-27 | Disposition: A | Discharge status: Home / Self Care | Payer: Medicare Other | Source: Ambulatory Visit | Attending: Family | Admitting: Family

## 2016-12-27 ENCOUNTER — Ambulatory Visit (INDEPENDENT_AMBULATORY_CARE_PROVIDER_SITE_OTHER): Payer: Medicare Other | Admitting: Family

## 2016-12-27 ENCOUNTER — Encounter (HOSPITAL_COMMUNITY): Payer: Self-pay

## 2016-12-27 VITALS — BP 128/60 | HR 56 | Temp 98.0°F | Resp 24 | Ht 70.0 in | Wt 168.0 lb

## 2016-12-27 DIAGNOSIS — I714 Abdominal aortic aneurysm, without rupture, unspecified: Secondary | ICD-10-CM

## 2016-12-27 DIAGNOSIS — Z9889 Other specified postprocedural states: Secondary | ICD-10-CM

## 2016-12-27 DIAGNOSIS — I6523 Occlusion and stenosis of bilateral carotid arteries: Secondary | ICD-10-CM

## 2016-12-27 DIAGNOSIS — Z87891 Personal history of nicotine dependence: Secondary | ICD-10-CM | POA: Diagnosis not present

## 2016-12-27 DIAGNOSIS — R0989 Other specified symptoms and signs involving the circulatory and respiratory systems: Secondary | ICD-10-CM

## 2016-12-27 DIAGNOSIS — Z95828 Presence of other vascular implants and grafts: Secondary | ICD-10-CM | POA: Insufficient documentation

## 2016-12-27 DIAGNOSIS — I6522 Occlusion and stenosis of left carotid artery: Secondary | ICD-10-CM | POA: Insufficient documentation

## 2016-12-27 DIAGNOSIS — I6529 Occlusion and stenosis of unspecified carotid artery: Secondary | ICD-10-CM | POA: Diagnosis not present

## 2016-12-27 NOTE — Patient Instructions (Signed)
Stroke Prevention Some medical conditions and behaviors are associated with an increased chance of having a stroke. You may prevent a stroke by making healthy choices and managing medical conditions. How can I reduce my risk of having a stroke?  Stay physically active. Get at least 30 minutes of activity on most or all days.  Do not smoke. It may also be helpful to avoid exposure to secondhand smoke.  Limit alcohol use. Moderate alcohol use is considered to be:  No more than 2 drinks per day for men.  No more than 1 drink per day for nonpregnant women.  Eat healthy foods. This involves:  Eating 5 or more servings of fruits and vegetables a day.  Making dietary changes that address high blood pressure (hypertension), high cholesterol, diabetes, or obesity.  Manage your cholesterol levels.  Making food choices that are high in fiber and low in saturated fat, trans fat, and cholesterol may control cholesterol levels.  Take any prescribed medicines to control cholesterol as directed by your health care provider.  Manage your diabetes.  Controlling your carbohydrate and sugar intake is recommended to manage diabetes.  Take any prescribed medicines to control diabetes as directed by your health care provider.  Control your hypertension.  Making food choices that are low in salt (sodium), saturated fat, trans fat, and cholesterol is recommended to manage hypertension.  Ask your health care provider if you need treatment to lower your blood pressure. Take any prescribed medicines to control hypertension as directed by your health care provider.  If you are 18-39 years of age, have your blood pressure checked every 3-5 years. If you are 40 years of age or older, have your blood pressure checked every year.  Maintain a healthy weight.  Reducing calorie intake and making food choices that are low in sodium, saturated fat, trans fat, and cholesterol are recommended to manage  weight.  Stop drug abuse.  Avoid taking birth control pills.  Talk to your health care provider about the risks of taking birth control pills if you are over 35 years old, smoke, get migraines, or have ever had a blood clot.  Get evaluated for sleep disorders (sleep apnea).  Talk to your health care provider about getting a sleep evaluation if you snore a lot or have excessive sleepiness.  Take medicines only as directed by your health care provider.  For some people, aspirin or blood thinners (anticoagulants) are helpful in reducing the risk of forming abnormal blood clots that can lead to stroke. If you have the irregular heart rhythm of atrial fibrillation, you should be on a blood thinner unless there is a good reason you cannot take them.  Understand all your medicine instructions.  Make sure that other conditions (such as anemia or atherosclerosis) are addressed. Get help right away if:  You have sudden weakness or numbness of the face, arm, or leg, especially on one side of the body.  Your face or eyelid droops to one side.  You have sudden confusion.  You have trouble speaking (aphasia) or understanding.  You have sudden trouble seeing in one or both eyes.  You have sudden trouble walking.  You have dizziness.  You have a loss of balance or coordination.  You have a sudden, severe headache with no known cause.  You have new chest pain or an irregular heartbeat. Any of these symptoms may represent a serious problem that is an emergency. Do not wait to see if the symptoms will go away.   Get medical help at once. Call your local emergency services (911 in [place] Do not drive yourself to the hospital. This information is not intended to replace advice given to you by your health care provider. Make sure you discuss any questions you have with your health care provider. Document Released: 11/15/2004 Document Revised: 03/15/2016 Document Reviewed: 04/10/2013 Elsevier  Interactive Patient Education  2017 Elsevier Inc.      Preventing Cerebrovascular Disease Arteries are blood vessels that carry blood that contains oxygen from the heart to all parts of the body. Cerebrovascular disease affects arteries that supply the brain. Any condition that blocks or disrupts blood flow to the brain can cause cerebrovascular disease. Brain cells that lose blood supply start to die within minutes (stroke). Stroke is the main danger of cerebrovascular disease. Atherosclerosis and high blood pressure are common causes of cerebrovascular disease. Atherosclerosis is narrowing and hardening of an artery that results when fat, cholesterol, calcium, or other substances (plaque) build up inside an artery. Plaque reduces blood flow through the artery. High blood pressure increases the risk of bleeding inside the brain. Making diet and lifestyle changes to prevent atherosclerosis and high blood pressure lowers your risk of cerebrovascular disease. What nutrition changes can be made?  Eat more fruits, vegetables, and whole grains.  Reduce how much saturated fat you eat. To do this, eat less red meat and fewer full-fat dairy products.  Eat healthy proteins instead of red meat. Healthy proteins include:  Fish. Eat fish that contains heart-healthy omega-3 fatty acids, twice a week. Examples include salmon, albacore tuna, mackerel, and herring.  Chicken.  Nuts.  Low-fat or nonfat yogurt.  Avoid processed meats, like bacon and lunchmeat.  Avoid foods that contain:  A lot of sugar, such as sweets and drinks with added sugar.  A lot of salt (sodium). Avoid adding extra salt to your food, as told by your health care provider.  Trans fats, such as margarine and baked goods. Trans fats may be listed as "partially hydrogenated oils" on food labels.  Check food labels to see how much sodium, sugar, and trans fats are in foods.  Use vegetable oils that contain low amounts of  saturated fat, such as olive oil or canola oil. What lifestyle changes can be made?  Drink alcohol in moderation. This means no more than 1 drink a day for nonpregnant women and 2 drinks a day for men. One drink equals 12 oz of beer, 5 oz of wine, or 1 oz of hard liquor.  If you are overweight, ask your health care provider to recommend a weight-loss plan for you. Losing 5-10 lb (2.2-4.5 kg) can reduce your risk of diabetes, atherosclerosis, and high blood pressure.  Exercise for 30?60 minutes on most days, or as much as told by your health care provider.  Do moderate-intensity exercise, such as brisk walking, bicycling, and water aerobics. Ask your health care provider which activities are safe for you.  Do not use any products that contain nicotine or tobacco, such as cigarettes and e-cigarettes. If you need help quitting, ask your health care provider. Why are these changes important? Making these changes lowers your risk of many diseases that can cause cerebrovascular disease and stroke. Stroke is a leading cause of death and disability. Making these changes also improves your overall health and quality of life. What can I do to lower my risk? The following factors make you more likely to develop cerebrovascular disease:  Being overweight.  Smoking.  Being physically   inactive.  Eating a high-fat diet.  Having certain health conditions, such as:  Diabetes.  High blood pressure.  Heart disease.  Atherosclerosis.  High cholesterol.  Sickle cell disease. Talk with your health care provider about your risk for cerebrovascular disease. Work with your health care provider to control diseases that you have that may contribute to cerebrovascular disease. Your health care provider may prescribe medicines to help prevent major causes of cerebrovascular disease. Where to find more information: Learn more about preventing cerebrovascular disease from:  National Heart, Lung, and  Blood Institute: www.nhlbi.nih.gov/health/health-topics/topics/stroke  Centers for Disease Control and Prevention: cdc.gov/stroke/about.htm Summary  Cerebrovascular disease can lead to a stroke.  Atherosclerosis and high blood pressure are major causes of cerebrovascular disease.  Making diet and lifestyle changes can reduce your risk of cerebrovascular disease.  Work with your health care provider to get your risk factors under control to reduce your risk of cerebrovascular disease. This information is not intended to replace advice given to you by your health care provider. Make sure you discuss any questions you have with your health care provider. Document Released: 10/23/2015 Document Revised: 04/27/2016 Document Reviewed: 10/23/2015 Elsevier Interactive Patient Education  2017 Elsevier Inc.  

## 2016-12-27 NOTE — Progress Notes (Signed)
[facility] [place]  CC: Follow up s/p EVAR and right CEA  History of Present Illness  Henry Neal is a 83 y.o. (1927/06/01) male patient of Dr. Oneida Alar who returns today for followup after right carotid endarterectomy. This was done in January 2015. He reports no episodes of TIA amaurosis stroke weakness or numbness in his extremities. He did have myocardial infarction in the Fall of 2016 and was treated by Dr. Martinique for this. He also previously had aneurysm stent graft repair in 2010. Continue to follow him for this as well. Patient also complains of bilateral lower extremity swelling. He does wear 20-30 mmHg compression stockings which are thigh-high. He states he is compliant with these. He has been wearing these for several months. He does get some relief while wearing these but states that the veins are still a nuisance problem for him.  Dr. Oneida Alar last evaluated pt on 12-22-15. At that time pt was doing well status post right carotid endarterectomy. Abdominal aortic stent graft was in good position with no evidence of endoleak, aneurysm diameter 3.4 cm. Bilateral symptomatic varicose veins The patient was advised to follow-up with [place] in one year for a carotid duplex exam as well as an aneurysm ultrasound. We will also see how he does over the next year with compression stockings for treating his varicose veins. If his symptoms are not relieved by this we will consider laser ablation. However, the patient was 83 years old at that time and to embark on a procedure for varicose veins may not be worth the benefit.   Pt reports chronic lower back issues for many years, no new back pain. He denies abdominal pain.  He denies claudication symptoms with walking.   He states his feet and legs are strong, but his feet feel unstable, states he will see a podiatrist about this.   He still works part time as a Furniture conservator/restorer.  He participates in cardiac rehab 3 days/week.   Pt  Diabetic: No Pt smoker: former smoker, quit in 1979, smoked x 30 years; quit smokeless tobacco in 1989  He takes Lipitor, Plavix, and ASA.    Past Medical History:  Diagnosis Date  . AAA (abdominal aortic aneurysm) ([facility])   . Abnormal stress test   . Allergic rhinitis    uses Flonase daily  . Anginal pain (HCC)    pain in neck,shoulders,down arms occ  . Arthritis   . Cancer ([place])    skin  . Carotid artery occlusion   . Chronic venous insufficiency   . Coronary artery disease   . Dizziness    takes Antivert daily as needed  . ED (erectile dysfunction)   . GERD (gastroesophageal reflux disease)    takes Omeprazole daily  . Hiatal hernia   . Hx of echocardiogram 04/04/2010   showed a borderline dilatd left ventricle with mild left ventricle hypertrophy and an EF 50-55% doppler suggested diastolic dysfunction, although the pattern is probably normal for an 83 yrs old. The left atrium was indeed mildly dilated at 42 mm, but there are no significant valvular abnormalities.   . Hyperlipidemia   . Hypertension    takes Proscar,Dyazide,Avapro,and Metoprolol daily  . Myocardial infarction Oct. 11, 2016   Heart Attack  . Palpitation   . Shortness of breath   . Sigmoid diverticulosis   . T12 compression fracture ([place])   . Thyroid disease    Past Surgical History:  Procedure Laterality Date  . ABDOMINAL AORTIC ANEURYSM  REPAIR  02/24/2009   AAA Stenting  . CARDIAC CATHETERIZATION N/A 08/04/2015   Procedure: Right/Left Heart Cath and Coronary Angiography;  Surgeon: Larey Dresser, MD;  Location: [facility];  Service: Cardiovascular;  Laterality: N/A;  . CARDIAC CATHETERIZATION N/A 08/04/2015   Procedure: Coronary Stent Intervention;  Surgeon: Burnell Blanks, MD;  Location: [facility];  Service: Cardiovascular;  Laterality: N/A;  . CARDIAC CATHETERIZATION N/A 08/08/2015   Procedure: Coronary Stent Intervention;  Surgeon: Peter M Martinique, MD;  Location: [facility];  Service: Cardiovascular;  Laterality: N/A;  . ENDARTERECTOMY Right 10/27/2013   Procedure: ENDARTERECTOMY CAROTID;  Surgeon: Elam Dutch, MD;  Location: [facility];  Service: Vascular;  Laterality: Right;  . EP IMPLANTABLE DEVICE N/A 01/26/2016   Procedure: BiV Pacemaker Insertion CRT-P;  Surgeon: Evans Lance, MD;  Location: [facility];  Service: Cardiovascular;  Laterality: N/A;  . HAND SURGERY Right    tendon, lft 90's  . HERNIA REPAIR Bilateral   . LEFT HEART CATHETERIZATION WITH CORONARY ANGIOGRAM N/A 10/12/2013   Procedure: LEFT HEART CATHETERIZATION WITH CORONARY ANGIOGRAM;  Surgeon: Lorretta Harp, MD;  Location: [facility] C[facility];  Service: Cardiovascular;  Laterality: N/A;  . melanoma removed    . ROTATOR CUFF REPAIR  2003   left arm   Social History Social History  Substance Use Topics  . Smoking status: Former Smoker    Packs/day: 2.00    Years: 30.00    Types: Cigarettes    Quit date: 10/22/1977  . Smokeless tobacco: Former Systems developer    Quit date: 10/22/1984  . Alcohol use 4.2 oz/week    7 Cans of beer per week   Family History Family History  Problem Relation Age of Onset  . Heart disease Mother     before age 61  . COPD Mother   . Diabetes Mother   . Hyperlipidemia Mother   . Hypertension Mother    Current Outpatient Prescriptions on File Prior to Visit  Medication Sig Dispense Refill  . acetaminophen (TYLENOL) 500 MG tablet Take 1,000 mg by mouth daily as needed for mild pain or headache. Reported on 01/25/2016    . aspirin EC 81 MG tablet Take 81 mg by mouth daily.      Marland Kitchen atorvastatin (LIPITOR) 40 MG tablet Take 1 tablet (40 mg total) by mouth daily at 6 PM. 30 tablet 6  . Calcium Carbonate-Vitamin D (CALCIUM 600 + D PO) Take 1 tablet by mouth 2 (two) times daily.     . carvedilol (COREG) 6.25 MG tablet take 1 tablet by mouth twice a day 180 tablet 3  . clopidogrel (PLAVIX) 75 MG tablet take 1 tablet by mouth once daily WITH BREAKFAST 90 tablet 3   . finasteride (PROSCAR) 5 MG tablet take 1 tablet by mouth once daily    . fluticasone (FLONASE) 50 MCG/ACT nasal spray Place 1 spray into both nostrils daily as needed for allergies.     Marland Kitchen gabapentin (NEURONTIN) 300 MG capsule Take 300 mg by mouth 2 (two) times daily.  0  . ketoconazole (NIZORAL) 2 % cream Apply to face daily as needed  0  . meclizine (ANTIVERT) 25 MG tablet Take 25 mg by mouth 2 (two) times daily as needed for dizziness.     . mometasone (ELOCON) 0.1 % cream Apply 1 application topically daily as needed (for irritation).     . Multiple Vitamin (MULTIVITAMIN PO) Take 1 tablet by mouth  daily. ALIVE men's vitamin    . NITROSTAT 0.4 MG SL tablet place 1 tablet under the tongue if needed every 5 minutes for chest pain CALL 911 AFTER 3 DOSES 25 tablet 10  . pantoprazole (PROTONIX) 40 MG tablet Take 40 mg by mouth daily.  0  . sacubitril-valsartan (ENTRESTO) 24-26 MG Take 1 tablet by mouth 2 (two) times daily. 60 tablet 3  . spironolactone (ALDACTONE) 25 MG tablet Take 0.5 tablets (12.5 mg total) by mouth daily. 30 tablet 3  . triamcinolone cream (KENALOG) 0.1 % Apply 1 application topically 2 (two) times daily as needed (skin).   0   No current facility-administered medications on file prior to visit.    Allergies  Allergen Reactions  . Doxepin Other (See Comments)    hallucinations   . Sinequan [Doxepin Hcl] Other (See Comments)    hallucinations  . Verapamil Other (See Comments)    Caused heart to race  . Procaine Hcl Other (See Comments)    Tingling all over as soon as it was injected     ROS: See HPI for pertinent positives and negatives.  Physical Examination  Vitals:   12/27/16 0914 12/27/16 0919  BP: 138/68 128/60  Pulse: (!) 55 (!) 56  Resp: (!) 24   Temp: 98 F (36.7 C)   TempSrc: Oral   SpO2: 94%   Weight: 168 lb (76.2 kg)   Height: 5\' 10"  (1.778 m)    Body mass index is 24.11 kg/m.  General: A&O x 3, WD, elderly male  Pulmonary: Sym exp,  respirations are non labored, good air movt, CTAB  Cardiac: RRR, Nl S1, S2, no murmur appreciated  Vascular: Vessel Right Left  Radial 2+Palpable 2+Palpable  Carotid  without bruit  without bruit  Aorta Not palpable N/A  Femoral Not Palpable Not Palpable  Popliteal 3+ palpable 3+ palpable  PT 3+Palpable 3+Palpable  DP Not Palpable Not Palpable   Gastrointestinal: soft, NTND, -G/R, - HSM, - palpable masses, - CVAT B.  Musculoskeletal: M/S 5/5 throughout, extremities without ischemic changes. Moderate kyphosis. He is wearing knee high compression hose.   Neurologic: Pain and light touch intact in extremities, Motor exam as listed above  Non-Invasive Vascular Imaging (Date: 12/27/16):  Carotid Duplex: Right CEA site with no restenosis. Left ICA with <40% stenosis.  Bilateral vertebral artery flow is antegrade.  Bilateral subclavian artery waveforms are normal.  No significant change compared to the last exam on 12-15-15.   EVAR Duplex  AAA sac size: 3.39 cm; Rght CIA: 1.36 cm; Left CIA: 1.45 cm  no endoleak detected  12-15-15: AAA sac size: 3.4 cm   Medical Decision Making  KYLAND NO is a 83 y.o. male who presents s/p EVAR (Date: 02-24-2009).  Pt is ssymptomatic with stable sac size.  I discussed with the patient the importance of surveillance of the endograft.  The next endograft duplex and carotid duplex will be scheduled for 12 months. Will add bilateral popliteal artery duplex as he has prominent popliteal pulses w/o claudication.   The patient will follow up with [place] in 12 months with these studies.  I emphasized the importance of maximal medical management including strict control of blood pressure, blood glucose, and lipid levels, antiplatelet agents, obtaining regular exercise, and cessation of smoking.   Thank you for allowing [place] to participate in this patient's care.  Clemon Chambers, RN, MSN, FNP-C [facility] [place] Office:  [facility] Physician: Oneida Alar  12/27/2016, 9:28 AM

## 2016-12-28 ENCOUNTER — Encounter (HOSPITAL_COMMUNITY): Payer: Self-pay

## 2017-01-01 ENCOUNTER — Encounter (HOSPITAL_COMMUNITY): Payer: Self-pay

## 2017-01-03 ENCOUNTER — Encounter (HOSPITAL_COMMUNITY): Payer: Self-pay

## 2017-01-03 NOTE — Addendum Note (Signed)
Addended by: Lianne Cure A on: 01/03/2017 09:36 AM   Modules accepted: Orders

## 2017-01-04 ENCOUNTER — Encounter (HOSPITAL_COMMUNITY): Payer: Self-pay

## 2017-01-08 ENCOUNTER — Encounter (HOSPITAL_COMMUNITY): Payer: Self-pay

## 2017-01-10 ENCOUNTER — Encounter (HOSPITAL_COMMUNITY): Payer: Self-pay

## 2017-01-11 ENCOUNTER — Encounter (HOSPITAL_COMMUNITY): Payer: Self-pay

## 2017-01-15 ENCOUNTER — Encounter (HOSPITAL_COMMUNITY): Payer: Self-pay

## 2017-01-17 ENCOUNTER — Encounter (HOSPITAL_COMMUNITY): Payer: Self-pay

## 2017-01-17 ENCOUNTER — Ambulatory Visit (INDEPENDENT_AMBULATORY_CARE_PROVIDER_SITE_OTHER): Payer: Medicare Other | Admitting: Podiatry

## 2017-01-17 ENCOUNTER — Encounter: Payer: Self-pay | Admitting: Podiatry

## 2017-01-17 VITALS — Ht 70.0 in | Wt 168.0 lb

## 2017-01-17 DIAGNOSIS — L84 Corns and callosities: Secondary | ICD-10-CM | POA: Diagnosis not present

## 2017-01-17 DIAGNOSIS — I739 Peripheral vascular disease, unspecified: Secondary | ICD-10-CM

## 2017-01-17 MED ORDER — CEPHALEXIN 500 MG PO CAPS: 500.0000 mg | ORAL_CAPSULE | Freq: Two times a day (BID) | ORAL | 0 refills | Status: DC

## 2017-01-17 NOTE — Progress Notes (Signed)
Patient ID: Henry Neal, male   DOB: [birth year], 83 y.o.   MRN: [id number] Complaint:  Visit Type: Patient presents to the office with chief complaint of a painful second toe right foot. He says that the area behind the toe has had become red and swollen and he thought he was developing an infection. This redness and swelling has now resolved. He continues to have a painful distal clavi I second toe right foot.  He presents the office today for an evaluation and treatment  Podiatric Exam: Vascular: dorsalis pedis and posterior tibial pulses are palpable bilateral. Capillary return is immediate. Temperature gradient is WNL. Skin turgor WNL  Sensorium: Normal Semmes Weinstein monofilament test. Normal tactile sensation bilaterally. Nail Exam: Pt has thick disfigured discolored nails with subungual debris noted bilateral entire nail hallux through fifth toenails Ulcer Exam: There is no evidence of ulcer or pre-ulcerative changes or infection. Orthopedic Exam: Muscle tone and strength are WNL. No limitations in general ROM. No crepitus or effusions noted. Foot type and digits show no abnormalities. Bony prominences are unremarkable. Skin: No Porokeratosis. No infection or ulcers. Distal Clavi second toe right.  Diagnosis:   Second toe clavi right  Treatment & Plan Procedures and Treatment: Consent by patient was obtained for treatment procedures. The patient understood the discussion of treatment and procedures well. All questions were answered thoroughly reviewed. Debridement of distal clavi second toe right foot. Prescribed cephalexin 500 mg #15 to be taken 1 twice a day.  I am prescribing the antibiotic since patient says he had an infection in the toe, which at this time has improved Return Visit-Office Procedure: Patient instructed to return to the office for a follow up visit 6 weeks for continued evaluation and treatment.    Gardiner Barefoot DPM

## 2017-01-18 ENCOUNTER — Telehealth (HOSPITAL_COMMUNITY): Payer: Self-pay | Admitting: *Deleted

## 2017-01-18 ENCOUNTER — Encounter (HOSPITAL_COMMUNITY): Admission: RE | Admit: 2017-01-18 | Payer: Self-pay | Source: Ambulatory Visit

## 2017-01-22 ENCOUNTER — Encounter (HOSPITAL_COMMUNITY)
Admission: RE | Admit: 2017-01-22 | Discharge: 2017-01-22 | Disposition: A | Discharge status: Home / Self Care | Payer: Self-pay | Source: Ambulatory Visit | Attending: Cardiology | Admitting: Cardiology

## 2017-01-22 DIAGNOSIS — I5022 Chronic systolic (congestive) heart failure: Secondary | ICD-10-CM | POA: Insufficient documentation

## 2017-01-24 ENCOUNTER — Encounter (HOSPITAL_COMMUNITY)
Admission: RE | Admit: 2017-01-24 | Discharge: 2017-01-24 | Disposition: A | Discharge status: Home / Self Care | Payer: Self-pay | Source: Ambulatory Visit | Attending: Cardiology | Admitting: Cardiology

## 2017-01-24 NOTE — Progress Notes (Signed)
Entry blood pressure 90/41. Patient asymptomatic. Patient given Gatorade. Recheck blood pressure 112/56. Henry Neal exercised without difficulty or complaints. Post exercise blood pressure 97/41. Henry Neal complained of feeling a little lightheaded. Patient given Gatorade post exercise.. Recheck blood pressure 85/54 sitting. Standing blood pressure 87/50 with a heart rate of 56. Henry Neal's feet were elevated with a recheck blood pressure of 102/56 sitting and a standing blood pressure of 101/51. Henry Neal reported feeling better after resting. The heart failure clinic called and notified, spoke with Kevan Rosebush RN.Medication list reviewed with the patient. Henry Neal says he is not taking his amlodipine because it causes his feet to swell. Will fax Henry Neal's exercise flow sheets from cardiac rehab maintenance for Dr Aundra Dubin to review.Barnet Pall, RN,BSN 01/24/2017 11:16 AM

## 2017-01-25 ENCOUNTER — Encounter (HOSPITAL_COMMUNITY)
Admission: RE | Admit: 2017-01-25 | Discharge: 2017-01-25 | Disposition: A | Discharge status: Home / Self Care | Payer: Self-pay | Source: Ambulatory Visit | Attending: Cardiology | Admitting: Cardiology

## 2017-01-25 ENCOUNTER — Ambulatory Visit (HOSPITAL_COMMUNITY)
Admission: RE | Admit: 2017-01-25 | Discharge: 2017-01-25 | Disposition: A | Discharge status: Home / Self Care | Payer: Medicare Other | Source: Ambulatory Visit | Attending: Internal Medicine | Admitting: Internal Medicine

## 2017-01-25 ENCOUNTER — Telehealth (HOSPITAL_COMMUNITY): Payer: Self-pay | Admitting: *Deleted

## 2017-01-25 DIAGNOSIS — I5022 Chronic systolic (congestive) heart failure: Secondary | ICD-10-CM | POA: Diagnosis not present

## 2017-01-25 LAB — BASIC METABOLIC PANEL
ANION GAP: 8 (ref 5–15)
BUN: 11 mg/dL (ref 6–20)
CALCIUM: 8.8 mg/dL — AB (ref 8.9–10.3)
CO2: 26 mmol/L (ref 22–32)
Chloride: 103 mmol/L (ref 101–111)
Creatinine, Ser: 0.96 mg/dL (ref 0.61–1.24)
Glucose, Bld: 109 mg/dL — ABNORMAL HIGH (ref 65–99)
Potassium: 3.5 mmol/L (ref 3.5–5.1)
Sodium: 137 mmol/L (ref 135–145)

## 2017-01-25 NOTE — Progress Notes (Signed)
Henry Neal's entry blood pressure was 90/52 sitting. Patient reported feeling lightheaded. Sitting blood pressure 78/34. Patient given Gatorade. Recheck blood pressure  Lying 118/65 with a heart rate of 55. Sitting blood pressure 104/55 sitting with a heart rate of 57. Standing blood pressure 111/52 with a heart rate of 60 standing. The heart failure clinic called and notified about today's blood pressures, spoke with Susie. Susie told me they will contact Henry Neal with further instructions. Patient left cardiac rehab without complaints or symptoms. I advised Henry Neal not exercise today.Barnet Pall, RN,BSN 01/25/2017 9:24 AM

## 2017-01-25 NOTE — Telephone Encounter (Signed)
Henry Neal with Cardiac Rehab called to report patient's BP was 90/52 standing and 78/34 sitting.  Patient was feeling dizzy and lightheaded this morning.  I spoke with Oda Kilts, PA and he wants patient to come in for lab work today, have patient hold Entresto and spiro through the weekend and restart them on Monday.  Also instructed to make patient an appointment for Amy, NP to see next week.  Patient is agreeable with plan, lab order placed, and appointment scheduled.

## 2017-01-28 DIAGNOSIS — M7531 Calcific tendinitis of right shoulder: Secondary | ICD-10-CM | POA: Diagnosis not present

## 2017-01-28 DIAGNOSIS — M25561 Pain in right knee: Secondary | ICD-10-CM | POA: Diagnosis not present

## 2017-01-28 DIAGNOSIS — I7300 Raynaud's syndrome without gangrene: Secondary | ICD-10-CM | POA: Diagnosis not present

## 2017-01-29 ENCOUNTER — Encounter (HOSPITAL_COMMUNITY): Payer: Self-pay

## 2017-01-30 ENCOUNTER — Encounter (HOSPITAL_COMMUNITY): Payer: Self-pay

## 2017-01-30 ENCOUNTER — Ambulatory Visit (HOSPITAL_COMMUNITY)
Admission: RE | Admit: 2017-01-30 | Discharge: 2017-01-30 | Disposition: A | Discharge status: Home / Self Care | Payer: Medicare Other | Source: Ambulatory Visit | Attending: Cardiology | Admitting: Cardiology

## 2017-01-30 VITALS — BP 134/52 | HR 60 | Wt 175.0 lb

## 2017-01-30 DIAGNOSIS — Z950 Presence of cardiac pacemaker: Secondary | ICD-10-CM | POA: Diagnosis not present

## 2017-01-30 DIAGNOSIS — Z79899 Other long term (current) drug therapy: Secondary | ICD-10-CM | POA: Diagnosis not present

## 2017-01-30 DIAGNOSIS — Z7982 Long term (current) use of aspirin: Secondary | ICD-10-CM | POA: Diagnosis not present

## 2017-01-30 DIAGNOSIS — I5022 Chronic systolic (congestive) heart failure: Secondary | ICD-10-CM | POA: Diagnosis not present

## 2017-01-30 DIAGNOSIS — I6521 Occlusion and stenosis of right carotid artery: Secondary | ICD-10-CM

## 2017-01-30 DIAGNOSIS — Z7951 Long term (current) use of inhaled steroids: Secondary | ICD-10-CM | POA: Diagnosis not present

## 2017-01-30 DIAGNOSIS — Z7902 Long term (current) use of antithrombotics/antiplatelets: Secondary | ICD-10-CM | POA: Insufficient documentation

## 2017-01-30 DIAGNOSIS — Z8249 Family history of ischemic heart disease and other diseases of the circulatory system: Secondary | ICD-10-CM | POA: Diagnosis not present

## 2017-01-30 DIAGNOSIS — N189 Chronic kidney disease, unspecified: Secondary | ICD-10-CM | POA: Insufficient documentation

## 2017-01-30 DIAGNOSIS — I2510 Atherosclerotic heart disease of native coronary artery without angina pectoris: Secondary | ICD-10-CM | POA: Insufficient documentation

## 2017-01-30 DIAGNOSIS — I252 Old myocardial infarction: Secondary | ICD-10-CM | POA: Diagnosis not present

## 2017-01-30 DIAGNOSIS — Z87891 Personal history of nicotine dependence: Secondary | ICD-10-CM | POA: Diagnosis not present

## 2017-01-30 DIAGNOSIS — Z955 Presence of coronary angioplasty implant and graft: Secondary | ICD-10-CM | POA: Diagnosis not present

## 2017-01-30 NOTE — Progress Notes (Signed)
Patient ID: Henry Neal, male   DOB: 1927/04/15, 83 y.o.   MRN: [id number] PCP: Dr. Ashby Dawes Cardiology: Dr. Aundra Dubin  83 yo with history of CAD, chronic systolic CHF, LBBB, AAA s/p repair presents for cardiology followup.  He had a cardiac cath in 12/15, showing total occlusion of the LAD and 50-60% proximal RCA.  EF at that time was near-normal.  In 10/16, he developed dyspnea and epigastric pain.  He was noted to be in acute pulmonary edema and was diuresed.  Troponin was only mildly elevated at 0.47.  He ultimately underwent LHC showing hazy 80-90% RCA stenosis which was likely the culprit for his presentation.  Additionally, the LAD was only subtotally occluded on this cath.  He had DES to RCA.  Cardiac MRI was done, showing EF 22% with significant viability in LAD territory.  Therefore, CTO of LAD was opened with DES.  Echo in 2/17 showed EF persistently low at 15%.  He had St Jude CRT-P placement in 4/17.   He returns today for HF follow up. He was at cardiac rehab last week and was orthostatic (90/52 sitting and 78/34 standing) so his Delene Loll and Arlyce Harman were held. He started taking his Delene Loll and Arlyce Harman back 2 days ago. BP today is 134/52. Orthostatic BP is 126/50 sitting, 120/50 standing. Echo from Jan. 2018 showed EF of 50-55%. He is feeling well, denies SOB. Works with cardiac rehab and does well without SOB. No SOB with stairs. Weight up 6 pounds from last month, weight at home 166-172 pounds, weight has been trending up lately. Can lay flat for sleep. Denies PND, chest pain.    ECG: NSR, BiV paced Optivol: Mild fluid overload, but he has not been taking his medications for a few days as we instructed him.   Labs (10/16): hgb 9.7, K 4.6, creatinine 1.12 Labs (11/16): K 5.9, creatinine 1.5 Labs (12/16); K 4.2, creatinine 1.07 => 1.02, BNP 448 Labs (1/17): K 4.1, creatinine 1.13, BNP 423, LDL 40, HDL 47 Labs (4/17): K 3.9, creatinine 1.03, HCT 32.6 Labs (5/17): K 4.4,  creatinine 1.09 Labs (4/18): K 3.5, creatinine 0.96.    PMH: 1. CKD 2. AAA s/p repair, followed at [facility]. 3. Carotid stenosis: s/p right CEA.   Carotid dopplers (2/16) with right CEA stable, < 97% LICA stenosis.  Followed at [facility].  4. Chronic LBBB 5. LAD: LHC (12/15) with totally occluded LAD, 50-60% pRCA => medically managed.  10/16 admitted with NSTEMI.  LHC with subtotalled LAD occlusion, 80-90% pRCA with thrombus => DES RCA initially.  Cardiac MRI was done showing viability in the LAD territory, so he had DES to the CTO LAD.   6. Chronic systolic CHF: Ischemic cardiomyopathy.  Echo (10/16) with EF 15% with wall motion abnormalities, moderately dilated RV with normal systolic function.  CMRI (10/16) with severe LV dilation, EF 22% with septal-lateral dyssynchrony, no LV thrombus, normal RV size/systolic function => significant viability noted in the LAD territory.  Echo (2/17): EF 15%, severe LV dilation, wall motion abnormalities noted, mild MR, mildly dilated RV with normal systolic function, PASP 55 mmHg.  St Jude CRT-P 4/17.  7. ABIs (10/16) were normal.  8. Raynaud's syndrome.  9. 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   Current Outpatient Prescriptions  Medication Sig Dispense Refill  . acetaminophen (TYLENOL) 500 MG tablet Take 1,000 mg by mouth  daily as needed for mild pain or headache. Reported on 01/25/2016    . amLODipine (NORVASC) 5 MG tablet take 1 tablet by mouth every morning if needed  0  . aspirin EC 81 MG tablet Take 81 mg by mouth daily.      Marland Kitchen atorvastatin (LIPITOR) 40 MG tablet Take 1 tablet (40 mg total) by mouth daily at 6 PM. 30 tablet 6  . Calcium Carbonate-Vitamin D (CALCIUM 600 + D PO) Take 1 tablet by mouth 2 (two) times daily.     . carvedilol (COREG) 6.25 MG tablet take 1 tablet by mouth twice a day 180 tablet 3  . cephALEXin (KEFLEX) 500 MG capsule Take 1  capsule (500 mg total) by mouth 2 (two) times daily. 20 capsule 0  . clopidogrel (PLAVIX) 75 MG tablet take 1 tablet by mouth once daily WITH BREAKFAST 90 tablet 3  . finasteride (PROSCAR) 5 MG tablet take 1 tablet by mouth once daily    . fluticasone (FLONASE) 50 MCG/ACT nasal spray Place 1 spray into both nostrils daily as needed for allergies.     Marland Kitchen gabapentin (NEURONTIN) 300 MG capsule Take 300 mg by mouth 2 (two) times daily.  0  . ketoconazole (NIZORAL) 2 % cream Apply to face daily as needed  0  . meclizine (ANTIVERT) 25 MG tablet Take 25 mg by mouth 2 (two) times daily as needed for dizziness.     . mometasone (ELOCON) 0.1 % cream Apply 1 application topically daily as needed (for irritation).     . Multiple Vitamin (MULTIVITAMIN PO) Take 1 tablet by mouth daily. ALIVE men's vitamin    . NITROSTAT 0.4 MG SL tablet place 1 tablet under the tongue if needed every 5 minutes for chest pain CALL 911 AFTER 3 DOSES 25 tablet 10  . pantoprazole (PROTONIX) 40 MG tablet Take 40 mg by mouth daily.  0  . sacubitril-valsartan (ENTRESTO) 24-26 MG Take 1 tablet by mouth 2 (two) times daily. 60 tablet 3  . spironolactone (ALDACTONE) 25 MG tablet Take 0.5 tablets (12.5 mg total) by mouth daily. 30 tablet 3  . triamcinolone cream (KENALOG) 0.1 % Apply 1 application topically 2 (two) times daily as needed (skin).   0   No current facility-administered medications for this encounter.    BP (!) 134/52 (BP Location: Left Arm, Patient Position: Sitting, Cuff Size: Normal)   Pulse 60   Wt 175 lb (79.4 kg)   SpO2 95%   BMI 25.11 kg/m  General: Elderly male, well appearing.  Neck: No JVD, no thyromegaly or thyroid nodule.  Lungs: Clear to auscultation bilaterally.  CV: Nondisplaced PMI.  Heart rate regular, no murmurs. No  carotid bruit.  Normal pedal pulses.  Abdomen: Soft, nontender, no hepatosplenomegaly, no distention.  Skin: Intact without lesions or rashes.  Neurologic: Alert and oriented x 3.    Psych: Normal affect. Extremities: No clubbing or cyanosis. Trace right ankle edema.  HEENT: Normal.   Assessment/Plan: 1. CAD: NSTEMI with DES to RCA (culprit vessel) and subsequent DES to CTO of LAD (cMRI showed viability in the LAD distribution) in 10/16.  No exertional chest pain.  - Continue ASA and Plavix.  - Continue statin, LDL was 37 in Jan. 2018.  - Doing well at cardiac rehab.   2. Chronic systolic CHF: Ischemic cardiomyopathy.   - NYHA class II symptoms.  He is not volume overloaded on exam.  - EF recovered now last Echo in 50-55%. - Continue Coreg 6.25  mg bid and spironolactone 25 daily.  - Continue Entresto 24/26mg  BID, Will not titrate up today with recent orthostatic symptoms. Can consider increasing at next visit.  - Optivol suggests that he has some volume, but he has not been taking his meds as he had an episode of orthostasis at cardiac rehab next week, so we instructed him to hold his Arlyce Harman and [place].  3. Carotid stenosis: Recent carotid dopplers without significant stenosis.   Follow up in 4 weeks.    Arbutus Leas, NP-C 01/30/2017

## 2017-01-30 NOTE — Patient Instructions (Signed)
Continue taking Entresto and Spironolactone as prescribed.  Follow up in 6 weeks

## 2017-01-31 ENCOUNTER — Encounter (HOSPITAL_COMMUNITY): Payer: Self-pay

## 2017-02-01 ENCOUNTER — Encounter (HOSPITAL_COMMUNITY): Payer: Self-pay

## 2017-02-05 ENCOUNTER — Other Ambulatory Visit (HOSPITAL_COMMUNITY): Payer: Self-pay | Admitting: Cardiology

## 2017-02-05 ENCOUNTER — Encounter (HOSPITAL_COMMUNITY)
Admission: RE | Admit: 2017-02-05 | Discharge: 2017-02-05 | Disposition: A | Discharge status: Home / Self Care | Payer: Self-pay | Source: Ambulatory Visit | Attending: Cardiology | Admitting: Cardiology

## 2017-02-07 ENCOUNTER — Encounter (HOSPITAL_COMMUNITY)
Admission: RE | Admit: 2017-02-07 | Discharge: 2017-02-07 | Disposition: A | Discharge status: Home / Self Care | Payer: Self-pay | Source: Ambulatory Visit | Attending: Cardiology | Admitting: Cardiology

## 2017-02-08 ENCOUNTER — Encounter (HOSPITAL_COMMUNITY): Payer: Self-pay

## 2017-02-12 ENCOUNTER — Encounter (HOSPITAL_COMMUNITY)
Admission: RE | Admit: 2017-02-12 | Discharge: 2017-02-12 | Disposition: A | Discharge status: Home / Self Care | Payer: Self-pay | Source: Ambulatory Visit | Attending: Cardiology | Admitting: Cardiology

## 2017-02-14 ENCOUNTER — Encounter (HOSPITAL_COMMUNITY): Payer: Self-pay

## 2017-02-14 ENCOUNTER — Other Ambulatory Visit: Payer: Self-pay | Admitting: Internal Medicine

## 2017-02-15 ENCOUNTER — Encounter: Payer: Self-pay | Admitting: Podiatry

## 2017-02-15 ENCOUNTER — Encounter (HOSPITAL_COMMUNITY): Payer: Self-pay

## 2017-02-15 ENCOUNTER — Ambulatory Visit (INDEPENDENT_AMBULATORY_CARE_PROVIDER_SITE_OTHER): Payer: Medicare Other | Admitting: Podiatry

## 2017-02-15 DIAGNOSIS — B351 Tinea unguium: Secondary | ICD-10-CM

## 2017-02-15 DIAGNOSIS — M79676 Pain in unspecified toe(s): Secondary | ICD-10-CM

## 2017-02-15 NOTE — Progress Notes (Signed)
Patient ID: Henry Neal, male   DOB: [birth year], 83 y.o.   MRN: [id number] Complaint:  Visit Type: Patient returns to my office for continued preventative foot care services. Complaint: Patient states" my nails have grown long and thick and become painful to walk and wear shoes"  He presents for preventative foot care services. No changes to ROS  Podiatric Exam: Vascular: dorsalis pedis and posterior tibial pulses are palpable bilateral. Capillary return is immediate. Temperature gradient is WNL. Skin turgor WNL  Sensorium: Normal Semmes Weinstein monofilament test. Normal tactile sensation bilaterally. Nail Exam: Pt has thick disfigured discolored nails with subungual debris noted bilateral entire nail hallux through fifth toenails Ulcer Exam: There is callus covering small ulcer at the distal aspect second toe right foot. Orthopedic Exam: Muscle tone and strength are WNL. No limitations in general ROM. No crepitus or effusions noted. Foot type and digits show no abnormalities. Bony prominences are unremarkable. Skin: No Porokeratosis. No infection or ulcers. Distal Clavi second toe right.  Diagnosis:  Tinea unguium, Pain in right toe, pain in left toes, Second toe clavi/ ulcer second toe right  Treatment & Plan Procedures and Treatment: Consent by patient was obtained for treatment procedures. The patient understood the discussion of treatment and procedures well. All questions were answered thoroughly reviewed. Debridement of mycotic and hypertrophic toenails, 1 through 5 bilateral and clearing of subungual debris. No ulceration, no infection noted. Debride clavi./ulcer Return Visit-Office Procedure: Patient instructed to return to the office for a follow up visit 9 weeks for continued evaluation and treatment.

## 2017-02-16 ENCOUNTER — Other Ambulatory Visit: Payer: Self-pay | Admitting: Cardiology

## 2017-02-18 NOTE — Telephone Encounter (Signed)
Rx has been sent to the pharmacy electronically. ° °

## 2017-02-19 ENCOUNTER — Encounter (HOSPITAL_COMMUNITY): Payer: Self-pay

## 2017-02-19 DIAGNOSIS — I5022 Chronic systolic (congestive) heart failure: Secondary | ICD-10-CM | POA: Insufficient documentation

## 2017-02-21 ENCOUNTER — Encounter (HOSPITAL_COMMUNITY): Payer: Self-pay

## 2017-02-22 ENCOUNTER — Encounter (HOSPITAL_COMMUNITY): Payer: Self-pay

## 2017-02-26 ENCOUNTER — Encounter (HOSPITAL_COMMUNITY)
Admission: RE | Admit: 2017-02-26 | Discharge: 2017-02-26 | Disposition: A | Discharge status: Home / Self Care | Payer: Self-pay | Source: Ambulatory Visit | Attending: Cardiology | Admitting: Cardiology

## 2017-02-27 ENCOUNTER — Ambulatory Visit (HOSPITAL_COMMUNITY)
Admission: RE | Admit: 2017-02-27 | Discharge: 2017-02-27 | Disposition: A | Discharge status: Home / Self Care | Payer: Medicare Other | Source: Ambulatory Visit | Attending: Cardiology | Admitting: Cardiology

## 2017-02-27 ENCOUNTER — Other Ambulatory Visit (HOSPITAL_COMMUNITY): Payer: Self-pay | Admitting: Pharmacist

## 2017-02-27 ENCOUNTER — Ambulatory Visit (INDEPENDENT_AMBULATORY_CARE_PROVIDER_SITE_OTHER): Payer: Medicare Other | Admitting: *Deleted

## 2017-02-27 VITALS — BP 110/46 | HR 59 | Wt 172.6 lb

## 2017-02-27 DIAGNOSIS — I252 Old myocardial infarction: Secondary | ICD-10-CM | POA: Insufficient documentation

## 2017-02-27 DIAGNOSIS — I5022 Chronic systolic (congestive) heart failure: Secondary | ICD-10-CM

## 2017-02-27 DIAGNOSIS — I5042 Chronic combined systolic (congestive) and diastolic (congestive) heart failure: Secondary | ICD-10-CM | POA: Insufficient documentation

## 2017-02-27 DIAGNOSIS — I6529 Occlusion and stenosis of unspecified carotid artery: Secondary | ICD-10-CM | POA: Diagnosis not present

## 2017-02-27 DIAGNOSIS — I255 Ischemic cardiomyopathy: Secondary | ICD-10-CM | POA: Insufficient documentation

## 2017-02-27 DIAGNOSIS — Z7902 Long term (current) use of antithrombotics/antiplatelets: Secondary | ICD-10-CM | POA: Diagnosis not present

## 2017-02-27 DIAGNOSIS — I2510 Atherosclerotic heart disease of native coronary artery without angina pectoris: Secondary | ICD-10-CM | POA: Insufficient documentation

## 2017-02-27 DIAGNOSIS — Z79899 Other long term (current) drug therapy: Secondary | ICD-10-CM | POA: Diagnosis not present

## 2017-02-27 DIAGNOSIS — Z7982 Long term (current) use of aspirin: Secondary | ICD-10-CM | POA: Diagnosis not present

## 2017-02-27 LAB — CUP PACEART REMOTE DEVICE CHECK
Battery Remaining Percentage: 95.5 %
Battery Voltage: 2.99 V
Brady Statistic AP VP Percent: 12 %
Brady Statistic AS VP Percent: 88 %
Brady Statistic RA Percent Paced: 11 %
Implantable Lead Implant Date: 20170406
Implantable Lead Implant Date: 20170406
Implantable Lead Location: 753858
Implantable Lead Location: 753859
Lead Channel Impedance Value: 590 Ohm
Lead Channel Impedance Value: 900 Ohm
Lead Channel Pacing Threshold Amplitude: 0.375 V
Lead Channel Sensing Intrinsic Amplitude: 12 mV
Lead Channel Sensing Intrinsic Amplitude: 2.5 mV
Lead Channel Setting Pacing Amplitude: 2 V
Lead Channel Setting Pacing Pulse Width: 0.4 ms
MDC IDC LEAD IMPLANT DT: 20170406
MDC IDC LEAD LOCATION: 753860
MDC IDC MSMT BATTERY REMAINING LONGEVITY: 127 mo
MDC IDC MSMT LEADCHNL LV PACING THRESHOLD AMPLITUDE: 0.75 V
MDC IDC MSMT LEADCHNL LV PACING THRESHOLD PULSEWIDTH: 0.4 ms
MDC IDC MSMT LEADCHNL RA IMPEDANCE VALUE: 480 Ohm
MDC IDC MSMT LEADCHNL RA PACING THRESHOLD PULSEWIDTH: 0.4 ms
MDC IDC MSMT LEADCHNL RV PACING THRESHOLD AMPLITUDE: 0.5 V
MDC IDC MSMT LEADCHNL RV PACING THRESHOLD PULSEWIDTH: 0.4 ms
MDC IDC PG IMPLANT DT: 20170406
MDC IDC PG SERIAL: 7866838
MDC IDC SESS DTM: 20180509132033
MDC IDC SET LEADCHNL LV PACING AMPLITUDE: 2 V
MDC IDC SET LEADCHNL RA PACING AMPLITUDE: 1.375
MDC IDC SET LEADCHNL RV PACING PULSEWIDTH: 0.4 ms
MDC IDC SET LEADCHNL RV SENSING SENSITIVITY: 2 mV
MDC IDC STAT BRADY AP VS PERCENT: 1 %
MDC IDC STAT BRADY AS VS PERCENT: 1 %

## 2017-02-27 MED ORDER — FUROSEMIDE 20 MG PO TABS: ORAL_TABLET | ORAL | 3 refills | Status: DC

## 2017-02-27 MED ORDER — SACUBITRIL-VALSARTAN 24-26 MG PO TABS: 1.0000 | ORAL_TABLET | Freq: Two times a day (BID) | ORAL | 5 refills | Status: DC

## 2017-02-27 NOTE — Patient Instructions (Signed)
Take Lasix 20 mg (1 tab) once weekly AS NEEDED for weight 167 lbs or more. If needed more than once weekly, call the CHF clinic for further instructions.  Call CHF clinical pharmacist Ileene Patrick for any medication assistance needs.  Follow up with Dr. Aundra Dubin in 3 months.  Do the following things EVERYDAY: 1) Weigh yourself in the morning before breakfast. Write it down and keep it in a log. 2) Take your medicines as prescribed 3) Eat low salt foods-Limit salt (sodium) to 2000 mg per day.  4) Stay as active as you can everyday 5) Limit all fluids for the day to less than 2 liters

## 2017-02-27 NOTE — Progress Notes (Signed)
Patient ID: Henry Neal, male   DOB: 04/29/1927, 83 y.o.   MRN: [id number] PCP: Dr. Ashby Dawes Cardiology: Dr. Aundra Dubin  83 yo with history of CAD, chronic systolic CHF, LBBB, AAA s/p repair presents for cardiology followup.  He had a cardiac cath in 12/15, showing total occlusion of the LAD and 50-60% proximal RCA.  EF at that time was near-normal.  In 10/16, he developed dyspnea and epigastric pain.  He was noted to be in acute pulmonary edema and was diuresed.  Troponin was only mildly elevated at 0.47.  He ultimately underwent LHC showing hazy 80-90% RCA stenosis which was likely the culprit for his presentation.  Additionally, the LAD was only subtotally occluded on this cath.  He had DES to RCA.  Cardiac MRI was done, showing EF 22% with significant viability in LAD territory.  Therefore, CTO of LAD was opened with DES.  Echo in 2/17 showed EF persistently low at 15%.  He had St Jude CRT-P placement in 4/17.   Today he returns for HF follow up. Overall feeling ok. Denies SOB/PND/Orthopnea. Mild dyspnea with steps.  Denies dizziness. Weight at home 163-166 pounds. Appetite ok. Continues to attend cardiac rehab maintenance program. Taking all medications.   ECG: NSR, BiV paced  Labs (10/16): hgb 9.7, K 4.6, creatinine 1.12 Labs (11/16): K 5.9, creatinine 1.5 Labs (12/16); K 4.2, creatinine 1.07 => 1.02, BNP 448 Labs (1/17): K 4.1, creatinine 1.13, BNP 423, LDL 40, HDL 47 Labs (4/17): K 3.9, creatinine 1.03, HCT 32.6 Labs (5/17): K 4.4, creatinine 1.09 Labs (01/25/2017): K 3.5 Creatinine 0.96   PMH: 1. CKD 2. AAA s/p repair, followed at [facility]. 3. Carotid stenosis: s/p right CEA.   Carotid dopplers (2/16) with right CEA stable, < 69% LICA stenosis.  Followed at [facility].  4. Chronic LBBB 5. LAD: LHC (12/15) with totally occluded LAD, 50-60% pRCA => medically managed.  10/16 admitted with NSTEMI.  LHC with subtotalled LAD occlusion, 80-90% pRCA with thrombus => DES RCA initially.  Cardiac MRI was  done showing viability in the LAD territory, so he had DES to the CTO LAD.   6. Chronic systolic CHF: Ischemic cardiomyopathy.  Echo (10/16) with EF 15% with wall motion abnormalities, moderately dilated RV with normal systolic function.  CMRI (10/16) with severe LV dilation, EF 22% with septal-lateral dyssynchrony, no LV thrombus, normal RV size/systolic function => significant viability noted in the LAD territory.  Echo (2/17): EF 15%, severe LV dilation, wall motion abnormalities noted, mild MR, mildly dilated RV with normal systolic function, PASP 55 mmHg.  St Jude CRT-P 4/17.  7. ABIs (10/16) were normal.  8. Raynaud's syndrome.  9. Sciatica 10. ECHO 11/16/2016 EF 50-55% Grade IIDD  SH: Lives alone, prior smoker, still working as a Furniture conservator/restorer.  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   Current Outpatient Prescriptions  Medication Sig Dispense Refill  . acetaminophen (TYLENOL) 500 MG tablet Take 1,000 mg by mouth daily as needed for mild pain or headache. Reported on 01/25/2016    . aspirin EC 81 MG tablet Take 81 mg by mouth daily.      Marland Kitchen atorvastatin (LIPITOR) 40 MG tablet Take 1 tablet (40 mg total) by mouth daily at 6 PM. 30 tablet 6  . Calcium Carbonate-Vitamin D (CALCIUM 600 + D PO) Take 1 tablet by mouth 2 (two) times daily.     . carvedilol (COREG) 6.25 MG tablet take 1 tablet by mouth twice a  day 180 tablet 3  . clopidogrel (PLAVIX) 75 MG tablet take 1 tablet by mouth once daily WITH BREAKFAST 90 tablet 3  . finasteride (PROSCAR) 5 MG tablet take 1 tablet by mouth once daily    . gabapentin (NEURONTIN) 300 MG capsule Take 300 mg by mouth 2 (two) times daily.  0  . meclizine (ANTIVERT) 25 MG tablet Take 25 mg by mouth 2 (two) times daily as needed for dizziness.     . mometasone (ELOCON) 0.1 % cream Apply 1 application topically daily as needed (for irritation).     . Multiple Vitamin (MULTIVITAMIN PO) Take 1 tablet by mouth daily. ALIVE men's vitamin    .  pantoprazole (PROTONIX) 40 MG tablet Take 40 mg by mouth daily.  0  . sacubitril-valsartan (ENTRESTO) 24-26 MG Take 1 tablet by mouth 2 (two) times daily. 60 tablet 3  . spironolactone (ALDACTONE) 25 MG tablet Take 12.5 mg by mouth daily.    Marland Kitchen triamcinolone cream (KENALOG) 0.1 % Apply 1 application topically 2 (two) times daily as needed (skin).   0  . fluticasone (FLONASE) 50 MCG/ACT nasal spray Place 1 spray into both nostrils daily as needed for allergies.     . nitroGLYCERIN (NITROSTAT) 0.4 MG SL tablet place 1 tablet under the tongue if needed every 5 minutes for chest pain for 3 doses (Patient not taking: Reported on 02/27/2017) 25 tablet 2   No current facility-administered medications for this encounter.    BP (!) 110/46 (BP Location: Left Arm, Patient Position: Sitting, Cuff Size: Normal)   Pulse (!) 59   Wt 172 lb 9.6 oz (78.3 kg)   SpO2 98%   BMI 24.77 kg/m   General:  Well appearing. No resp difficulty HEENT: normal Neck: supple. JVP 6-7. Carotids 2+ bilat; no bruits. No lymphadenopathy or thryomegaly appreciated. Cor: PMI nondisplaced. Regular rate & rhythm. No rubs, gallops or murmurs.L upper chest ICD.  Lungs: clear Abdomen: soft, nontender, nondistended. No hepatosplenomegaly. No bruits or masses. Good bowel sounds. Extremities: no cyanosis, clubbing, rash, edema Neuro: alert & orientedx3, cranial nerves grossly intact. moves all 4 extremities w/o difficulty. Affect pleasant  Assessment/Plan: 1. CAD: NSTEMI with DES to RCA (culprit vessel) and subsequent DES to CTO of LAD (cMRI showed viability in the LAD distribution) in 10/16. No CP. Continue current dose of asa+ plavix.   2. Chronic systolic/diastolic heart failure: ICM  11/2016 Ef 50-55%/ Grade II DD. St Jude CRT-D.  Volume status ok. Today I asked him to take 20 mg lasix if weight is 167 or greater no more than one time a week. He was also instructed to not take lasix after 2 pm to hopefully eliminate sleep  interruption.   Continue current dose of coreg, spiro, and entresto.  3. Carotid stenosis: Has followup at [facility].    Follow up in 3 months with Dr Aundra Dubin.   Kyndle Schlender NP-C  02/27/2017

## 2017-02-27 NOTE — Progress Notes (Signed)
Remote pacemaker transmission.   

## 2017-02-27 NOTE — Progress Notes (Addendum)
Advanced Heart Failure Medication Review by a Pharmacist  Does the patient  feel that his/her medications are working for him/her?  yes  Has the patient been experiencing any side effects to the medications prescribed?  no  Does the patient measure his/her own blood pressure or blood glucose at home?  no   Does the patient have any problems obtaining medications due to transportation or finances?   Yes - Entresto $37/mo which is expensive for him   Understanding of regimen: good Understanding of indications: good Potential of compliance: good Patient understands to avoid NSAIDs. Patient understands to avoid decongestants.  Issues to address at subsequent visits: None   Pharmacist comments: Mr. Frenkel is a pleasant 83 yo M presenting without a medication list but with good recall of his regimen. He reports great compliance with his regimen. He did express concerns with the cost of his Delene Loll so I will enroll him in [employer] for assistance. Relayed info to [employer]   Member ID: [id number] Group ID: [id number] RxBin ID: [id number] PCN: [id number] Eligibility Start Date: 11/29/2016 Eligibility End Date: 02/26/2018 Assistance Amount: $800.00    Doroteo Bradford K. Velva Harman, PharmD, BCPS, CPP Clinical Pharmacist Pager: [phone] Phone: [phone] 02/27/2017 9:43 AM    Time with patient: 10 minutes Preparation and documentation time: 10 minutes Total time: 20 minutes

## 2017-02-28 ENCOUNTER — Encounter (HOSPITAL_COMMUNITY)
Admission: RE | Admit: 2017-02-28 | Discharge: 2017-02-28 | Disposition: A | Discharge status: Home / Self Care | Payer: Self-pay | Source: Ambulatory Visit | Attending: Cardiology | Admitting: Cardiology

## 2017-03-01 ENCOUNTER — Encounter (HOSPITAL_COMMUNITY)
Admission: RE | Admit: 2017-03-01 | Discharge: 2017-03-01 | Disposition: A | Discharge status: Home / Self Care | Payer: Self-pay | Source: Ambulatory Visit | Attending: Cardiology | Admitting: Cardiology

## 2017-03-01 ENCOUNTER — Encounter: Payer: Self-pay | Admitting: Cardiology

## 2017-03-01 ENCOUNTER — Encounter: Payer: Self-pay | Admitting: Podiatry

## 2017-03-01 ENCOUNTER — Ambulatory Visit (INDEPENDENT_AMBULATORY_CARE_PROVIDER_SITE_OTHER): Payer: Medicare Other | Admitting: Podiatry

## 2017-03-01 DIAGNOSIS — B351 Tinea unguium: Secondary | ICD-10-CM

## 2017-03-01 DIAGNOSIS — M79676 Pain in unspecified toe(s): Secondary | ICD-10-CM | POA: Diagnosis not present

## 2017-03-01 DIAGNOSIS — I739 Peripheral vascular disease, unspecified: Secondary | ICD-10-CM

## 2017-03-01 NOTE — Progress Notes (Signed)
Patient ID: Henry Neal, male   DOB: 09-02-1927, 83 y.o.   MRN: [id number] Complaint:  Visit Type: Patient returns to my office for continued preventative foot care services. Complaint: Patient states" my nails have grown long and thick and become painful to walk and wear shoes"  He presents for preventative foot care services. No changes to ROS  Podiatric Exam: Vascular: dorsalis pedis and posterior tibial pulses are palpable bilateral. Capillary return is immediate. Temperature gradient is WNL. Skin turgor WNL  Sensorium: Normal Semmes Weinstein monofilament test. Normal tactile sensation bilaterally. Nail Exam: Pt has thick disfigured discolored nails with subungual debris noted bilateral entire nail hallux through fifth toenails Ulcer Exam: There is no evidence of ulcer or pre-ulcerative changes or infection. Orthopedic Exam: Muscle tone and strength are WNL. No limitations in general ROM. No crepitus or effusions noted. Foot type and digits show no abnormalities. Bony prominences are unremarkable. Skin: No Porokeratosis. No infection or ulcers. Distal Clavi second toe right.  Diagnosis:  Tinea unguium, Pain in right toe, pain in left toes, Second toe clavi right  Treatment & Plan Procedures and Treatment: Consent by patient was obtained for treatment procedures. The patient understood the discussion of treatment and procedures well. All questions were answered thoroughly reviewed. Debridement of mycotic and hypertrophic toenails, 1 through 5 bilateral and clearing of subungual debris. No ulceration, no infection noted. Clavi asymptomatic today. Return Visit-Office Procedure: Patient instructed to return to the office for a follow up visit 9 weeks  for continued evaluation and treatment.

## 2017-03-05 ENCOUNTER — Encounter (HOSPITAL_COMMUNITY)
Admission: RE | Admit: 2017-03-05 | Discharge: 2017-03-05 | Disposition: A | Discharge status: Home / Self Care | Payer: Self-pay | Source: Ambulatory Visit | Attending: Cardiology | Admitting: Cardiology

## 2017-03-06 ENCOUNTER — Telehealth (HOSPITAL_COMMUNITY): Payer: Self-pay | Admitting: Cardiology

## 2017-03-06 NOTE — Telephone Encounter (Signed)
WALK IN FORM Date 03/08/17 @ 949A  Patient completed walk in form  RE: Have gained weight Lasix one dose didn't seem to help   Left message for patient today to clarify

## 2017-03-06 NOTE — Telephone Encounter (Signed)
Patient returned call, reports weight 5/15 169 lb Weight 5/16 166.8  States he is compliant with taking lasix once a week as needed however he feels he could use an additional sometimes. Reports his weight did come down- feels increased weight was more from constipation. Denies CP, SOB, only mild chronic LE edema. Pt is able to participate in cardiac rehab.    Advised to continue current medications and call back if his weight continues to creep up or he becomes symptomatic.

## 2017-03-07 ENCOUNTER — Encounter (HOSPITAL_COMMUNITY)
Admission: RE | Admit: 2017-03-07 | Discharge: 2017-03-07 | Disposition: A | Discharge status: Home / Self Care | Payer: Self-pay | Source: Ambulatory Visit | Attending: Cardiology | Admitting: Cardiology

## 2017-03-08 ENCOUNTER — Encounter (HOSPITAL_COMMUNITY)
Admission: RE | Admit: 2017-03-08 | Discharge: 2017-03-08 | Disposition: A | Discharge status: Home / Self Care | Payer: Self-pay | Source: Ambulatory Visit | Attending: Cardiology | Admitting: Cardiology

## 2017-03-12 ENCOUNTER — Encounter (HOSPITAL_COMMUNITY): Payer: Self-pay

## 2017-03-14 ENCOUNTER — Encounter (HOSPITAL_COMMUNITY)
Admission: RE | Admit: 2017-03-14 | Discharge: 2017-03-14 | Disposition: A | Discharge status: Home / Self Care | Payer: Self-pay | Source: Ambulatory Visit | Attending: Cardiology | Admitting: Cardiology

## 2017-03-15 ENCOUNTER — Encounter (HOSPITAL_COMMUNITY)
Admission: RE | Admit: 2017-03-15 | Discharge: 2017-03-15 | Disposition: A | Discharge status: Home / Self Care | Payer: Self-pay | Source: Ambulatory Visit | Attending: Cardiology | Admitting: Cardiology

## 2017-03-19 ENCOUNTER — Encounter (HOSPITAL_COMMUNITY)
Admission: RE | Admit: 2017-03-19 | Discharge: 2017-03-19 | Disposition: A | Discharge status: Home / Self Care | Payer: Self-pay | Source: Ambulatory Visit | Attending: Cardiology | Admitting: Cardiology

## 2017-03-21 ENCOUNTER — Encounter (HOSPITAL_COMMUNITY): Payer: Self-pay

## 2017-03-22 ENCOUNTER — Encounter (HOSPITAL_COMMUNITY): Payer: Self-pay

## 2017-03-22 DIAGNOSIS — I5022 Chronic systolic (congestive) heart failure: Secondary | ICD-10-CM | POA: Insufficient documentation

## 2017-03-26 ENCOUNTER — Encounter (HOSPITAL_COMMUNITY): Payer: Self-pay

## 2017-03-28 ENCOUNTER — Encounter (HOSPITAL_COMMUNITY)
Admission: RE | Admit: 2017-03-28 | Discharge: 2017-03-28 | Disposition: A | Discharge status: Home / Self Care | Payer: Self-pay | Source: Ambulatory Visit | Attending: Cardiology | Admitting: Cardiology

## 2017-03-29 ENCOUNTER — Encounter (HOSPITAL_COMMUNITY)
Admission: RE | Admit: 2017-03-29 | Discharge: 2017-03-29 | Disposition: A | Discharge status: Home / Self Care | Payer: Self-pay | Source: Ambulatory Visit | Attending: Cardiology | Admitting: Cardiology

## 2017-03-29 ENCOUNTER — Telehealth (HOSPITAL_COMMUNITY): Payer: Self-pay | Admitting: *Deleted

## 2017-03-29 NOTE — Telephone Encounter (Signed)
Received surgical clearance from [facility].  Clearance faxed back today to [phone] advising for patient to hold Plavix 5 days prior to procedure and to restart after surgery.

## 2017-04-02 ENCOUNTER — Encounter (HOSPITAL_COMMUNITY)
Admission: RE | Admit: 2017-04-02 | Discharge: 2017-04-02 | Disposition: A | Discharge status: Home / Self Care | Payer: Self-pay | Source: Ambulatory Visit | Attending: Cardiology | Admitting: Cardiology

## 2017-04-04 ENCOUNTER — Encounter (HOSPITAL_COMMUNITY): Payer: Self-pay

## 2017-04-04 DIAGNOSIS — E782 Mixed hyperlipidemia: Secondary | ICD-10-CM | POA: Diagnosis not present

## 2017-04-04 DIAGNOSIS — I2510 Atherosclerotic heart disease of native coronary artery without angina pectoris: Secondary | ICD-10-CM | POA: Diagnosis not present

## 2017-04-05 ENCOUNTER — Encounter (HOSPITAL_COMMUNITY): Payer: Self-pay

## 2017-04-08 DIAGNOSIS — H02532 Eyelid retraction right lower eyelid: Secondary | ICD-10-CM | POA: Diagnosis not present

## 2017-04-08 DIAGNOSIS — H02115 Cicatricial ectropion of left lower eyelid: Secondary | ICD-10-CM | POA: Diagnosis not present

## 2017-04-08 DIAGNOSIS — H02112 Cicatricial ectropion of right lower eyelid: Secondary | ICD-10-CM | POA: Diagnosis not present

## 2017-04-08 DIAGNOSIS — H02132 Senile ectropion of right lower eyelid: Secondary | ICD-10-CM | POA: Diagnosis not present

## 2017-04-09 ENCOUNTER — Encounter (HOSPITAL_COMMUNITY): Payer: Self-pay

## 2017-04-11 ENCOUNTER — Encounter (HOSPITAL_COMMUNITY): Payer: Self-pay

## 2017-04-11 DIAGNOSIS — I2510 Atherosclerotic heart disease of native coronary artery without angina pectoris: Secondary | ICD-10-CM | POA: Diagnosis not present

## 2017-04-11 DIAGNOSIS — E782 Mixed hyperlipidemia: Secondary | ICD-10-CM | POA: Diagnosis not present

## 2017-04-11 DIAGNOSIS — I779 Disorder of arteries and arterioles, unspecified: Secondary | ICD-10-CM | POA: Diagnosis not present

## 2017-04-11 DIAGNOSIS — R7303 Prediabetes: Secondary | ICD-10-CM | POA: Diagnosis not present

## 2017-04-12 ENCOUNTER — Encounter (HOSPITAL_COMMUNITY): Payer: Self-pay

## 2017-04-16 ENCOUNTER — Encounter (HOSPITAL_COMMUNITY): Payer: Self-pay

## 2017-04-18 ENCOUNTER — Encounter (HOSPITAL_COMMUNITY): Payer: Self-pay

## 2017-04-19 ENCOUNTER — Ambulatory Visit: Payer: Medicare Other | Admitting: Podiatry

## 2017-04-19 ENCOUNTER — Encounter (HOSPITAL_COMMUNITY): Payer: Self-pay

## 2017-04-23 ENCOUNTER — Encounter (HOSPITAL_COMMUNITY): Payer: Self-pay | Attending: Cardiology

## 2017-04-23 DIAGNOSIS — I5022 Chronic systolic (congestive) heart failure: Secondary | ICD-10-CM | POA: Insufficient documentation

## 2017-04-25 ENCOUNTER — Encounter (HOSPITAL_COMMUNITY): Payer: Self-pay

## 2017-04-26 ENCOUNTER — Encounter (HOSPITAL_COMMUNITY): Payer: Self-pay

## 2017-04-26 ENCOUNTER — Encounter: Payer: Self-pay | Admitting: Podiatry

## 2017-04-26 ENCOUNTER — Ambulatory Visit (INDEPENDENT_AMBULATORY_CARE_PROVIDER_SITE_OTHER): Payer: Medicare Other | Admitting: Podiatry

## 2017-04-26 DIAGNOSIS — L84 Corns and callosities: Secondary | ICD-10-CM | POA: Diagnosis not present

## 2017-04-26 DIAGNOSIS — M79676 Pain in unspecified toe(s): Secondary | ICD-10-CM

## 2017-04-26 NOTE — Progress Notes (Signed)
Patient ID: Henry Neal, male   DOB: January 04, 1927, 83 y.o.   MRN: [id number] Complaint:  Visit Type: Patient presents to the office with chief complaint of a corn  second toe right foot.   He continues to have a painful distal clavi I second toe right foot.  He presents the office today for an evaluation and treatment  Podiatric Exam: Vascular: dorsalis pedis and posterior tibial pulses are palpable bilateral. Capillary return is immediate. Temperature gradient is WNL. Skin turgor WNL  Sensorium: Normal Semmes Weinstein monofilament test. Normal tactile sensation bilaterally. Nail Exam: Pt has thick disfigured discolored nails with subungual debris noted bilateral entire nail hallux through fifth toenails Ulcer Exam: There is no evidence of ulcer or pre-ulcerative changes or infection. Orthopedic Exam: Muscle tone and strength are WNL. No limitations in general ROM. No crepitus or effusions noted. Foot type and digits show no abnormalities. Bony prominences are unremarkable. Skin: No Porokeratosis. No infection or ulcers. Distal Clavi second toe right.  Diagnosis:   Second toe clavi right  Treatment & Plan Procedures and Treatment: Consent by patient was obtained for treatment procedures. The patient understood the discussion of treatment and procedures well. All questions were answered thoroughly reviewed. Debridement of distal clavi second toe right foot.       Return Visit-Office Procedure: Patient instructed to return to the office for a follow up visit 9 weeks for continued evaluation and treatment.    Gardiner Barefoot DPM

## 2017-04-30 ENCOUNTER — Encounter (HOSPITAL_COMMUNITY): Payer: Self-pay

## 2017-04-30 DIAGNOSIS — L821 Other seborrheic keratosis: Secondary | ICD-10-CM | POA: Diagnosis not present

## 2017-04-30 DIAGNOSIS — Z85828 Personal history of other malignant neoplasm of skin: Secondary | ICD-10-CM | POA: Diagnosis not present

## 2017-04-30 DIAGNOSIS — D692 Other nonthrombocytopenic purpura: Secondary | ICD-10-CM | POA: Diagnosis not present

## 2017-04-30 DIAGNOSIS — D225 Melanocytic nevi of trunk: Secondary | ICD-10-CM | POA: Diagnosis not present

## 2017-05-02 ENCOUNTER — Ambulatory Visit (HOSPITAL_COMMUNITY)
Admission: EM | Admit: 2017-05-02 | Discharge: 2017-05-02 | Disposition: A | Discharge status: Home / Self Care | Payer: Medicare Other | Attending: Emergency Medicine | Admitting: Emergency Medicine

## 2017-05-02 ENCOUNTER — Encounter (HOSPITAL_COMMUNITY): Payer: Self-pay | Admitting: Family Medicine

## 2017-05-02 ENCOUNTER — Ambulatory Visit (INDEPENDENT_AMBULATORY_CARE_PROVIDER_SITE_OTHER): Payer: Medicare Other

## 2017-05-02 ENCOUNTER — Encounter (HOSPITAL_COMMUNITY): Payer: Self-pay

## 2017-05-02 DIAGNOSIS — M545 Low back pain, unspecified: Secondary | ICD-10-CM

## 2017-05-02 DIAGNOSIS — R109 Unspecified abdominal pain: Secondary | ICD-10-CM

## 2017-05-02 DIAGNOSIS — M5136 Other intervertebral disc degeneration, lumbar region: Secondary | ICD-10-CM | POA: Diagnosis not present

## 2017-05-02 LAB — POCT URINALYSIS DIP (DEVICE)
Bilirubin Urine: NEGATIVE
Glucose, UA: NEGATIVE mg/dL
HGB URINE DIPSTICK: NEGATIVE
Ketones, ur: NEGATIVE mg/dL
Leukocytes, UA: NEGATIVE
NITRITE: NEGATIVE
PROTEIN: NEGATIVE mg/dL
SPECIFIC GRAVITY, URINE: 1.01 (ref 1.005–1.030)
Urobilinogen, UA: 0.2 mg/dL (ref 0.0–1.0)
pH: 7 (ref 5.0–8.0)

## 2017-05-02 MED ORDER — DICLOFENAC SODIUM 1 % TD GEL: 2.0000 g | Freq: Four times a day (QID) | TRANSDERMAL | 0 refills | Status: DC

## 2017-05-02 NOTE — Discharge Instructions (Signed)
For your back pain, prescribed Voltaren gel, apply to the affected area to 4 times a day as needed for pain, you can take this with Tylenol. If your pain persists or fails to resolve, follow up with your primary care provider in one week.

## 2017-05-02 NOTE — ED Triage Notes (Signed)
Pt here for lower and mid back pain. Denies injury. Pt ambulated to room okay. Denies radiation. Denies numbness, tingling or weakness.

## 2017-05-02 NOTE — ED Provider Notes (Signed)
CSN: [id number]     Arrival date & time 05/02/17  1519 History   None    Chief Complaint  Patient presents with  . Back Pain   (Consider location/radiation/quality/duration/timing/severity/associated sxs/prior Treatment) 83 year old male presents to clinic for evaluation of lower back pain. Ongoing for 2 days. States she was leaning over 2 days ago, he felt a pull in his back, pain is worse with bending forward, and with lying flat. Denies any trauma, has not fallen, denies any history of osteoporosis or osteopenia. Also complaining of lower abdominal pain as well, denies any nausea, vomiting, diarrhea, or other symptoms. Numbness or tingling in the distal extremities, no loss of control of bowel or bladder function.   The history is provided by the patient.    Past Medical History:  Diagnosis Date  . AAA (abdominal aortic aneurysm) ([place])   . Abnormal stress test   . Allergic rhinitis    uses Flonase daily  . Anginal pain (H[facility])    pain in neck,shoulders,down arms occ  . Arthritis   . Cancer ([place])    skin  . Carotid artery occlusion   . Chronic venous insufficiency   . Coronary artery disease   . Dizziness    takes Antivert daily as needed  . ED (erectile dysfunction)   . GERD (gastroesophageal reflux disease)    takes Omeprazole daily  . Hiatal hernia   . Hx of echocardiogram 04/04/2010   showed a borderline dilatd left ventricle with mild left ventricle hypertrophy and an EF 50-55% doppler suggested diastolic dysfunction, although the pattern is probably normal for an 83 yrs old. The left atrium was indeed mildly dilated at 42 mm, but there are no significant valvular abnormalities.   . Hyperlipidemia   . Hypertension    takes Proscar,Dyazide,Avapro,and Metoprolol daily  . Myocardial infarction [facility]) Oct. 11, 2016   Heart Attack  . Palpitation   . Shortness of breath   . Sigmoid diverticulosis   . T12 compression fracture ([place])   . Thyroid disease    Past Surgical  History:  Procedure Laterality Date  . ABDOMINAL AORTIC ANEURYSM REPAIR  02/24/2009   AAA Stenting  . CARDIAC CATHETERIZATION N/A 08/04/2015   Procedure: Right/Left Heart Cath and Coronary Angiography;  Surgeon: Larey Dresser, MD;  Location: [place] [facility];  Service: Cardiovascular;  Laterality: N/A;  . CARDIAC CATHETERIZATION N/A 08/04/2015   Procedure: Coronary Stent Intervention;  Surgeon: Burnell Blanks, MD;  Location: [place] [facility];  Service: Cardiovascular;  Laterality: N/A;  . CARDIAC CATHETERIZATION N/A 08/08/2015   Procedure: Coronary Stent Intervention;  Surgeon: Peter M Martinique, MD;  Location: [place] [facility];  Service: Cardiovascular;  Laterality: N/A;  . ENDARTERECTOMY Right 10/27/2013   Procedure: ENDARTERECTOMY CAROTID;  Surgeon: Elam Dutch, MD;  Location: [place];  Service: Vascular;  Laterality: Right;  . EP IMPLANTABLE DEVICE N/A 01/26/2016   Procedure: BiV Pacemaker Insertion CRT-P;  Surgeon: Evans Lance, MD;  Location: [place] [facility];  Service: Cardiovascular;  Laterality: N/A;  . HAND SURGERY Right    tendon, lft 90's  . HERNIA REPAIR Bilateral   . LEFT HEART CATHETERIZATION WITH CORONARY ANGIOGRAM N/A 10/12/2013   Procedure: LEFT HEART CATHETERIZATION WITH CORONARY ANGIOGRAM;  Surgeon: Lorretta Harp, MD;  Location: [facility] C[facility];  Service: Cardiovascular;  Laterality: N/A;  . melanoma removed    . ROTATOR CUFF REPAIR  2003   left arm   Family History  Problem Relation Age  of Onset  . Heart disease Mother        before age 30  . COPD Mother   . Diabetes Mother   . Hyperlipidemia Mother   . Hypertension Mother    Social History  Substance Use Topics  . Smoking status: Former Smoker    Packs/day: 2.00    Years: 30.00    Types: Cigarettes    Quit date: 10/22/1977  . Smokeless tobacco: Former Systems developer    Quit date: 10/22/1984  . Alcohol use 4.2 oz/week    7 Cans of beer per week    Review of Systems  Constitutional: Negative.    HENT: Negative.   Respiratory: Negative.   Cardiovascular: Negative.   Gastrointestinal: Positive for abdominal pain.  Musculoskeletal: Positive for back pain.  Skin: Negative.   Neurological: Negative.     Allergies  Doxepin; Sinequan [doxepin hcl]; Verapamil; and Procaine hcl  Home Medications   Prior to Admission medications   Medication Sig Start Date End Date Taking? Authorizing Provider  acetaminophen (TYLENOL) 500 MG tablet Take 1,000 mg by mouth daily as needed for mild pain or headache. Reported on 01/25/2016    [provider]  aspirin EC 81 MG tablet Take 81 mg by mouth daily.      [provider]  atorvastatin (LIPITOR) 40 MG tablet Take 1 tablet (40 mg total) by mouth daily at 6 PM. 08/09/15   Tillery, Satira Mccallum, PA-C  Calcium Carbonate-Vitamin D (CALCIUM 600 + D PO) Take 1 tablet by mouth 2 (two) times daily.     [provider]  carvedilol (COREG) 6.25 MG tablet take 1 tablet by mouth twice a day 08/20/16   Larey Dresser, MD  clopidogrel (PLAVIX) 75 MG tablet take 1 tablet by mouth once daily WITH BREAKFAST 06/05/16   Shirley Friar, PA-C  diclofenac sodium (VOLTAREN) 1 % GEL Apply 2 g topically 4 (four) times daily. 05/02/17   Barnet Glasgow, NP  finasteride (PROSCAR) 5 MG tablet take 1 tablet by mouth once daily 11/19/14   [provider]  fluticasone (FLONASE) 50 MCG/ACT nasal spray Place 1 spray into both nostrils daily as needed for allergies.  08/09/13   [provider]  furosemide (LASIX) 20 MG tablet Take 20 mg (1 tab) once weekly AS NEEDED for weight 167 lbs or more. If needed more than once weekly, call CHF clinic [phone]. 02/27/17   Clegg, Amy D, NP  gabapentin (NEURONTIN) 300 MG capsule Take 300 mg by mouth 2 (two) times daily. 11/01/14   [provider]  meclizine (ANTIVERT) 25 MG tablet Take 25 mg by mouth 2 (two) times daily as needed for dizziness.     [provider]   mometasone (ELOCON) 0.1 % cream Apply 1 application topically daily as needed (for irritation).  07/30/13   [provider]  Multiple Vitamin (MULTIVITAMIN PO) Take 1 tablet by mouth daily. ALIVE men's vitamin    [provider]  nitroGLYCERIN (NITROSTAT) 0.4 MG SL tablet place 1 tablet under the tongue if needed every 5 minutes for chest pain for 3 doses 02/18/17   Evans Lance, MD  pantoprazole (PROTONIX) 40 MG tablet Take 40 mg by mouth daily. 02/14/15   [provider]  polyethylene glycol (MIRALAX / GLYCOLAX) packet Take 17 g by mouth daily.    [provider]  sacubitril-valsartan (ENTRESTO) 24-26 MG Take 1 tablet by mouth 2 (two) times daily. 02/27/17   Larey Dresser, MD  spironolactone (  ALDACTONE) 25 MG tablet Take 12.5 mg by mouth daily.    [provider]  triamcinolone cream (KENALOG) 0.1 % Apply 1 application topically 2 (two) times daily as needed (skin).  09/24/15   [provider]   Meds Ordered and Administered this Visit  Medications - No data to display  BP (!) 140/52   Pulse (!) 58   Temp 98.2 F (36.8 C)   Resp 18   SpO2 98%  No data found.   Physical Exam  Constitutional: He is oriented to person, place, and time. He appears well-developed and well-nourished. No distress.  HENT:  Head: Normocephalic and atraumatic.  Right Ear: External ear normal.  Left Ear: External ear normal.  Eyes: Conjunctivae are normal.  Cardiovascular: Normal rate and regular rhythm.   Pulmonary/Chest: Effort normal and breath sounds normal.  Abdominal: Soft. He exhibits no distension. There is no tenderness.  Musculoskeletal:       Lumbar back: He exhibits tenderness and pain. He exhibits no bony tenderness and no deformity.  Neurological: He is alert and oriented to person, place, and time.  Skin: Skin is warm and dry. Capillary refill takes less than 2 seconds. He is not diaphoretic.  Psychiatric: He has a normal mood and  affect. His behavior is normal.  Nursing note and vitals reviewed.   Urgent Care Course     Procedures (including critical care time)  Labs Review Labs Reviewed  POCT URINALYSIS DIP (DEVICE)    Imaging Review Dg Lumbar Spine Complete  Result Date: 05/02/2017 CLINICAL DATA:  Lower back pain for 3 days on both sides LEFT greater than RIGHT exacerbated by movement EXAM: LUMBAR SPINE - COMPLETE 4+ VIEW COMPARISON:  MRI lumbar spine 03/17/2013 FINDINGS: Five non-rib-bearing lumbar vertebra. Bones diffusely demineralized. Chronic marked superior endplate compression fracture of T12 vertebral body with greater 50% anterior height loss, unchanged. Scattered endplate spur formation. Vertebral body heights otherwise maintained without acute fracture or subluxation. No bone destruction or spondylolysis. SI joints preserved. Prior endoluminal stenting of the aorta and common iliac arteries with aortic atherosclerotic calcification noted. IMPRESSION: Old marked superior endplate compression fracture of T12. Osseous demineralization with scattered degenerative disc disease changes. No acute osseous abnormalities. Aortic Atherosclerosis (ICD10-I70.0). Electronically Signed   By: Lavonia Dana M.D.   On: 05/02/2017 16:29      MDM   1. Acute midline low back pain without sciatica     X-ray significant for an old compression fracture T12, also noted for significant degenerative disc disease, however no acute abnormalities. Recommend rest, ice as needed, UA negative, patient due to age, comorbidities, and also being on blood thinners, given topical medicine. Started  Voltaren gel, continue Tylenol, follow-up with primary care provider    Barnet Glasgow, NP 05/02/17 1802

## 2017-05-03 ENCOUNTER — Encounter (HOSPITAL_COMMUNITY): Payer: Self-pay

## 2017-05-07 ENCOUNTER — Encounter (HOSPITAL_COMMUNITY): Payer: Self-pay

## 2017-05-09 ENCOUNTER — Encounter (HOSPITAL_COMMUNITY): Payer: Self-pay

## 2017-05-10 ENCOUNTER — Encounter (HOSPITAL_COMMUNITY): Payer: Self-pay

## 2017-05-10 ENCOUNTER — Ambulatory Visit: Payer: Medicare Other | Admitting: Podiatry

## 2017-05-14 ENCOUNTER — Encounter (HOSPITAL_COMMUNITY): Payer: Self-pay

## 2017-05-16 ENCOUNTER — Encounter (HOSPITAL_COMMUNITY): Payer: Self-pay

## 2017-05-17 ENCOUNTER — Encounter (HOSPITAL_COMMUNITY): Payer: Self-pay

## 2017-05-21 ENCOUNTER — Encounter (HOSPITAL_COMMUNITY): Payer: Self-pay

## 2017-05-21 ENCOUNTER — Telehealth (HOSPITAL_COMMUNITY): Payer: Self-pay | Admitting: *Deleted

## 2017-05-23 ENCOUNTER — Encounter (HOSPITAL_COMMUNITY): Payer: Self-pay | Attending: Internal Medicine

## 2017-05-23 DIAGNOSIS — I5022 Chronic systolic (congestive) heart failure: Secondary | ICD-10-CM | POA: Insufficient documentation

## 2017-05-24 ENCOUNTER — Encounter (HOSPITAL_COMMUNITY): Payer: Self-pay

## 2017-05-27 ENCOUNTER — Encounter (HOSPITAL_COMMUNITY): Payer: Self-pay | Admitting: Cardiology

## 2017-05-27 ENCOUNTER — Ambulatory Visit (HOSPITAL_COMMUNITY)
Admission: RE | Admit: 2017-05-27 | Discharge: 2017-05-27 | Disposition: A | Discharge status: Home / Self Care | Payer: Medicare Other | Source: Ambulatory Visit | Attending: Cardiology | Admitting: Cardiology

## 2017-05-27 VITALS — BP 128/58 | HR 58 | Wt 175.0 lb

## 2017-05-27 DIAGNOSIS — M545 Low back pain: Secondary | ICD-10-CM | POA: Diagnosis not present

## 2017-05-27 DIAGNOSIS — I255 Ischemic cardiomyopathy: Secondary | ICD-10-CM | POA: Diagnosis not present

## 2017-05-27 DIAGNOSIS — I2510 Atherosclerotic heart disease of native coronary artery without angina pectoris: Secondary | ICD-10-CM

## 2017-05-27 DIAGNOSIS — M5489 Other dorsalgia: Secondary | ICD-10-CM | POA: Diagnosis not present

## 2017-05-27 DIAGNOSIS — Z7902 Long term (current) use of antithrombotics/antiplatelets: Secondary | ICD-10-CM | POA: Diagnosis not present

## 2017-05-27 DIAGNOSIS — I252 Old myocardial infarction: Secondary | ICD-10-CM | POA: Insufficient documentation

## 2017-05-27 DIAGNOSIS — I5042 Chronic combined systolic (congestive) and diastolic (congestive) heart failure: Secondary | ICD-10-CM | POA: Insufficient documentation

## 2017-05-27 DIAGNOSIS — M542 Cervicalgia: Secondary | ICD-10-CM | POA: Diagnosis not present

## 2017-05-27 DIAGNOSIS — M5430 Sciatica, unspecified side: Secondary | ICD-10-CM | POA: Insufficient documentation

## 2017-05-27 DIAGNOSIS — I10 Essential (primary) hypertension: Secondary | ICD-10-CM

## 2017-05-27 DIAGNOSIS — M549 Dorsalgia, unspecified: Secondary | ICD-10-CM | POA: Diagnosis not present

## 2017-05-27 DIAGNOSIS — Z87891 Personal history of nicotine dependence: Secondary | ICD-10-CM | POA: Insufficient documentation

## 2017-05-27 DIAGNOSIS — Z7982 Long term (current) use of aspirin: Secondary | ICD-10-CM | POA: Diagnosis not present

## 2017-05-27 DIAGNOSIS — I7300 Raynaud's syndrome without gangrene: Secondary | ICD-10-CM | POA: Insufficient documentation

## 2017-05-27 DIAGNOSIS — I5022 Chronic systolic (congestive) heart failure: Secondary | ICD-10-CM | POA: Diagnosis not present

## 2017-05-27 DIAGNOSIS — I447 Left bundle-branch block, unspecified: Secondary | ICD-10-CM | POA: Diagnosis not present

## 2017-05-27 DIAGNOSIS — Z8249 Family history of ischemic heart disease and other diseases of the circulatory system: Secondary | ICD-10-CM | POA: Insufficient documentation

## 2017-05-27 DIAGNOSIS — N189 Chronic kidney disease, unspecified: Secondary | ICD-10-CM | POA: Insufficient documentation

## 2017-05-27 DIAGNOSIS — I6529 Occlusion and stenosis of unspecified carotid artery: Secondary | ICD-10-CM | POA: Diagnosis not present

## 2017-05-27 DIAGNOSIS — I739 Peripheral vascular disease, unspecified: Secondary | ICD-10-CM | POA: Diagnosis not present

## 2017-05-27 LAB — CBC
HCT: 32.9 % — ABNORMAL LOW (ref 39.0–52.0)
Hemoglobin: 10.6 g/dL — ABNORMAL LOW (ref 13.0–17.0)
MCH: 28.1 pg (ref 26.0–34.0)
MCHC: 32.2 g/dL (ref 30.0–36.0)
MCV: 87.3 fL (ref 78.0–100.0)
PLATELETS: 140 10*3/uL — AB (ref 150–400)
RBC: 3.77 MIL/uL — AB (ref 4.22–5.81)
RDW: 14.7 % (ref 11.5–15.5)
WBC: 5.5 10*3/uL (ref 4.0–10.5)

## 2017-05-27 LAB — BASIC METABOLIC PANEL
Anion gap: 6 (ref 5–15)
BUN: 10 mg/dL (ref 6–20)
CALCIUM: 8.9 mg/dL (ref 8.9–10.3)
CHLORIDE: 105 mmol/L (ref 101–111)
CO2: 27 mmol/L (ref 22–32)
CREATININE: 1 mg/dL (ref 0.61–1.24)
GFR calc non Af Amer: >60 mL/min (ref >60)
Glucose, Bld: 103 mg/dL — ABNORMAL HIGH (ref 65–99)
Potassium: 3.7 mmol/L (ref 3.5–5.1)
Sodium: 138 mmol/L (ref 135–145)

## 2017-05-27 LAB — BRAIN NATRIURETIC PEPTIDE: B NATRIURETIC PEPTIDE 5: 39.7 pg/mL (ref 0.0–100.0)

## 2017-05-27 NOTE — Progress Notes (Signed)
Patient ID: Henry Neal, male   DOB: [birth year], 83 y.o.   MRN: [id number]      Advanced Heart Failure Clinic Note  PCP: Dr. Ashby Dawes Cardiology: Dr. Thresa Ross is a 83 y.o. male with history of CAD, chronic systolic CHF, LBBB, AAA s/p repair presents for cardiology followup.  He had a cardiac cath in 12/15, showing total occlusion of the LAD and 50-60% proximal RCA.  EF at that time was near-normal.  In 10/16, he developed dyspnea and epigastric pain.  He was noted to be in acute pulmonary edema and was diuresed.  Troponin was only mildly elevated at 0.47.  He ultimately underwent LHC showing hazy 80-90% RCA stenosis which was likely the culprit for his presentation.  Additionally, the LAD was only subtotally occluded on this cath.  He had DES to RCA.  Cardiac MRI was done, showing EF 22% with significant viability in LAD territory.  Therefore, CTO of LAD was opened with DES.  Echo in 2/17 showed EF persistently low at 15%.  He had St Jude CRT-P placement in 4/17.   He presents today for regular follow up. At last visit educated on sliding scale diuretics. Weight up 3 lbs since that visit.  Weight at home 166-168. Has been dealing with back pain since last month. Seen in urgent care and old compression fracture noted along with chronic degenerative changes. Denies any SOB/PND or orthopnea. Taking all medication as directed. He has not been doing cardiac rehab maintenance program for about 2 months.   Labs (10/16): hgb 9.7, K 4.6, creatinine 1.12 Labs (11/16): K 5.9, creatinine 1.5 Labs (12/16); K 4.2, creatinine 1.07 => 1.02, BNP 448 Labs (1/17): K 4.1, creatinine 1.13, BNP 423, LDL 40, HDL 47 Labs (4/17): K 3.9, creatinine 1.03, HCT 32.6 Labs (5/17): K 4.4, creatinine 1.09 Labs (01/25/2017): K 3.5 Creatinine 0.96   PMH: 1. CKD 2. AAA s/p repair, followed at [facility]. 3. Carotid stenosis: s/p right CEA.   Carotid dopplers (2/16) with right CEA stable, < 51% LICA stenosis.  Followed  at [facility].  4. Chronic LBBB 5. LAD: LHC (12/15) with totally occluded LAD, 50-60% pRCA => medically managed.  10/16 admitted with NSTEMI.  LHC with subtotalled LAD occlusion, 80-90% pRCA with thrombus => DES RCA initially.  Cardiac MRI was done showing viability in the LAD territory, so he had DES to the CTO LAD.   6. Chronic systolic CHF: Ischemic cardiomyopathy.  Echo (10/16) with EF 15% with wall motion abnormalities, moderately dilated RV with normal systolic function.  CMRI (10/16) with severe LV dilation, EF 22% with septal-lateral dyssynchrony, no LV thrombus, normal RV size/systolic function => significant viability noted in the LAD territory.  Echo (2/17): EF 15%, severe LV dilation, wall motion abnormalities noted, mild MR, mildly dilated RV with normal systolic function, PASP 55 mmHg.  St Jude CRT-P 4/17.  7. ABIs (10/16) were normal.  8. Raynaud's syndrome.  9. Sciatica 10. ECHO 11/16/2016 EF 50-55% Grade IIDD  SH: Lives alone, prior smoker, still working as a Furniture conservator/restorer.  FH: CAD  Review of systems complete and found to be negative unless listed in HPI.    Current Outpatient Prescriptions  Medication Sig Dispense Refill  . acetaminophen (TYLENOL) 500 MG tablet Take 1,000 mg by mouth daily as needed for mild pain or headache. Reported on 01/25/2016    . aspirin EC 81 MG tablet Take 81 mg by mouth daily.      Marland Kitchen atorvastatin (LIPITOR) 40  MG tablet Take 1 tablet (40 mg total) by mouth daily at 6 PM. 30 tablet 6  . Calcium Carbonate-Vitamin D (CALCIUM 600 + D PO) Take 1 tablet by mouth 2 (two) times daily.     . carvedilol (COREG) 6.25 MG tablet take 1 tablet by mouth twice a day 180 tablet 3  . clopidogrel (PLAVIX) 75 MG tablet take 1 tablet by mouth once daily WITH BREAKFAST 90 tablet 3  . diclofenac sodium (VOLTAREN) 1 % GEL Apply 2 g topically 4 (four) times daily. 1 Tube 0  . finasteride (PROSCAR) 5 MG tablet take 1 tablet by mouth once daily    . fluticasone (FLONASE) 50 MCG/ACT nasal  spray Place 1 spray into both nostrils daily as needed for allergies.     . furosemide (LASIX) 20 MG tablet Take 20 mg (1 tab) once weekly AS NEEDED for weight 167 lbs or more. If needed more than once weekly, call CHF clinic [phone]. 15 tablet 3  . gabapentin (NEURONTIN) 300 MG capsule Take 300 mg by mouth 2 (two) times daily.  0  . meclizine (ANTIVERT) 25 MG tablet Take 25 mg by mouth 2 (two) times daily as needed for dizziness.     . mometasone (ELOCON) 0.1 % cream Apply 1 application topically daily as needed (for irritation).     . Multiple Vitamin (MULTIVITAMIN PO) Take 1 tablet by mouth daily. ALIVE men's vitamin    . pantoprazole (PROTONIX) 40 MG tablet Take 40 mg by mouth daily.  0  . polyethylene glycol (MIRALAX / GLYCOLAX) packet Take 17 g by mouth daily.    . sacubitril-valsartan (ENTRESTO) 24-26 MG Take 1 tablet by mouth 2 (two) times daily. 60 tablet 5  . spironolactone (ALDACTONE) 25 MG tablet Take 12.5 mg by mouth daily.    Marland Kitchen triamcinolone cream (KENALOG) 0.1 % Apply 1 application topically 2 (two) times daily as needed (skin).   0  . nitroGLYCERIN (NITROSTAT) 0.4 MG SL tablet place 1 tablet under the tongue if needed every 5 minutes for chest pain for 3 doses (Patient not taking: Reported on 05/27/2017) 25 tablet 2   No current facility-administered medications for this encounter.    BP (!) 128/58   Pulse (!) 58   Wt 175 lb (79.4 kg)   SpO2 99%   BMI 25.11 kg/m   Wt Readings from Last 3 Encounters:  05/27/17 175 lb (79.4 kg)  02/27/17 172 lb 9.6 oz (78.3 kg)  01/30/17 175 lb (79.4 kg)     General: Elderly appearing. No resp difficulty. HEENT: Normal Neck: Supple. JVP 5-6. Carotids 2+ bilat; no bruits. No thyromegaly or nodule noted. Cor: PMI nondisplaced. RRR, No M/G/R noted Lungs: CTAB, normal effort. Abdomen: Soft, non-tender, non-distended, no HSM. No bruits or masses. +BS  Extremities: No cyanosis, clubbing, rash, R and LLE no edema.  Neuro: Alert &  orientedx3, cranial nerves grossly intact. moves all 4 extremities w/o difficulty. Affect pleasant   Assessment/Plan: 1. CAD: NSTEMI with DES to RCA (culprit vessel) and subsequent DES to CTO of LAD (cMRI showed viability in the LAD distribution) in 10/16. - Denies exertional chest pain. Mild chest discomfort at rest last week. Took lasix on Saturday.  - Continue current dose of asa+ plavix.   2. Chronic systolic/diastolic heart failure: ICM  11/2016 Ef 50-55%/ Grade II DD. St Jude CRT-D.  - Volume status stable on exam - Continue lasix 20 mg po as needed for weight over 167 lbs at home.  -  Continue coreg 6.25 mg BID - Continue spiro 12.5 mg daily - Continue Entresto 24/26 mg BID.  - Recommended he resume Cardiac Rehab maintenance program.  - Reinforced fluid restriction to < 2 L daily, sodium restriction to less than 2000 mg daily, and the importance of daily weights.   3. Carotid stenosis:  - Has follow up at [facility]. 4. Back pain - Manageable without pain medication per patient. Work up in [employer] 05/02/17 unremarkable for any acute changes.  - Recommended PCP follow up.   Stable from HF perspective. Labs today. Follow up 3 months with Dr. Aundra Dubin.   Shirley Friar, PA-C  05/27/2017

## 2017-05-27 NOTE — Patient Instructions (Signed)
Labs today  Your physician recommends that you schedule a follow-up appointment in: 3 months  

## 2017-05-28 ENCOUNTER — Encounter (HOSPITAL_COMMUNITY): Payer: Self-pay

## 2017-05-29 ENCOUNTER — Ambulatory Visit (INDEPENDENT_AMBULATORY_CARE_PROVIDER_SITE_OTHER): Payer: Medicare Other | Admitting: *Deleted

## 2017-05-29 DIAGNOSIS — I5022 Chronic systolic (congestive) heart failure: Secondary | ICD-10-CM | POA: Diagnosis not present

## 2017-05-29 NOTE — Progress Notes (Signed)
Remote pacemaker transmission.   

## 2017-05-30 ENCOUNTER — Encounter: Payer: Self-pay | Admitting: Cardiology

## 2017-05-30 ENCOUNTER — Encounter (HOSPITAL_COMMUNITY): Payer: Self-pay

## 2017-05-30 DIAGNOSIS — H04213 Epiphora due to excess lacrimation, bilateral lacrimal glands: Secondary | ICD-10-CM | POA: Diagnosis not present

## 2017-05-30 DIAGNOSIS — H0289 Other specified disorders of eyelid: Secondary | ICD-10-CM | POA: Diagnosis not present

## 2017-05-31 ENCOUNTER — Other Ambulatory Visit: Payer: Self-pay | Admitting: Internal Medicine

## 2017-05-31 ENCOUNTER — Encounter (HOSPITAL_COMMUNITY): Payer: Self-pay

## 2017-06-04 ENCOUNTER — Encounter (HOSPITAL_COMMUNITY): Payer: Self-pay

## 2017-06-04 LAB — CUP PACEART REMOTE DEVICE CHECK
Battery Remaining Longevity: 124 mo
Battery Voltage: 2.99 V
Brady Statistic AP VS Percent: 1 %
Brady Statistic AS VS Percent: 1 %
Date Time Interrogation Session: 20180806124728
Implantable Lead Implant Date: 20170406
Implantable Lead Location: 753858
Implantable Lead Location: 753860
Implantable Pulse Generator Implant Date: 20170406
Lead Channel Impedance Value: 480 Ohm
Lead Channel Impedance Value: 850 Ohm
Lead Channel Pacing Threshold Amplitude: 0.625 V
Lead Channel Pacing Threshold Amplitude: 1.375 V
Lead Channel Sensing Intrinsic Amplitude: 12 mV
Lead Channel Setting Pacing Amplitude: 2 V
Lead Channel Setting Pacing Amplitude: 2.375
Lead Channel Setting Pacing Pulse Width: 0.4 ms
Lead Channel Setting Pacing Pulse Width: 0.4 ms
MDC IDC LEAD IMPLANT DT: 20170406
MDC IDC LEAD IMPLANT DT: 20170406
MDC IDC LEAD LOCATION: 753859
MDC IDC MSMT BATTERY REMAINING PERCENTAGE: 95.5 %
MDC IDC MSMT LEADCHNL LV PACING THRESHOLD AMPLITUDE: 0.875 V
MDC IDC MSMT LEADCHNL LV PACING THRESHOLD PULSEWIDTH: 0.4 ms
MDC IDC MSMT LEADCHNL RA PACING THRESHOLD PULSEWIDTH: 0.4 ms
MDC IDC MSMT LEADCHNL RA SENSING INTR AMPL: 2.1 mV
MDC IDC MSMT LEADCHNL RV IMPEDANCE VALUE: 590 Ohm
MDC IDC MSMT LEADCHNL RV PACING THRESHOLD PULSEWIDTH: 0.4 ms
MDC IDC SET LEADCHNL RV PACING AMPLITUDE: 2 V
MDC IDC SET LEADCHNL RV SENSING SENSITIVITY: 2 mV
MDC IDC STAT BRADY AP VP PERCENT: 12 %
MDC IDC STAT BRADY AS VP PERCENT: 88 %
MDC IDC STAT BRADY RA PERCENT PACED: 11 %
Pulse Gen Model: 3262
Pulse Gen Serial Number: 7866838

## 2017-06-05 DIAGNOSIS — T300 Burn of unspecified body region, unspecified degree: Secondary | ICD-10-CM | POA: Insufficient documentation

## 2017-06-06 ENCOUNTER — Encounter (HOSPITAL_COMMUNITY): Payer: Self-pay

## 2017-06-07 ENCOUNTER — Encounter (HOSPITAL_COMMUNITY): Payer: Self-pay | Admitting: Cardiology

## 2017-06-07 ENCOUNTER — Encounter (HOSPITAL_COMMUNITY): Payer: Self-pay

## 2017-06-09 ENCOUNTER — Other Ambulatory Visit (HOSPITAL_COMMUNITY): Payer: Self-pay | Admitting: Student

## 2017-06-11 ENCOUNTER — Encounter (HOSPITAL_COMMUNITY): Payer: Self-pay

## 2017-06-13 ENCOUNTER — Encounter (HOSPITAL_COMMUNITY): Payer: Self-pay

## 2017-06-14 ENCOUNTER — Encounter (HOSPITAL_COMMUNITY): Payer: Self-pay

## 2017-06-18 ENCOUNTER — Encounter: Payer: Self-pay | Admitting: Internal Medicine

## 2017-06-18 ENCOUNTER — Ambulatory Visit (INDEPENDENT_AMBULATORY_CARE_PROVIDER_SITE_OTHER): Payer: Medicare Other | Admitting: Internal Medicine

## 2017-06-18 ENCOUNTER — Encounter (HOSPITAL_COMMUNITY): Payer: Self-pay

## 2017-06-18 VITALS — BP 100/48 | HR 53 | Ht 70.0 in | Wt 171.0 lb

## 2017-06-18 DIAGNOSIS — I447 Left bundle-branch block, unspecified: Secondary | ICD-10-CM

## 2017-06-18 DIAGNOSIS — I5022 Chronic systolic (congestive) heart failure: Secondary | ICD-10-CM | POA: Diagnosis not present

## 2017-06-18 NOTE — Patient Instructions (Signed)
Medication Instructions:  Your physician recommends that you continue on your current medications as directed. Please refer to the Current Medication list given to you today.  Labwork: None ordered.  Testing/Procedures: None ordered.  Follow-Up: Your physician wants you to follow-up in: one year with Dr. Lovena Le.   You will receive a reminder letter in the mail two months in advance. If you don't receive a letter, please call our office to schedule the follow-up appointment.  Remote monitoring is used to monitor your Pacemaker from home. This monitoring reduces the number of office visits required to check your device to one time per year. It allows [place] to keep an eye on the functioning of your device to ensure it is working properly. You are scheduled for a device check from home on 08/28/2017. You may send your transmission at any time that day. If you have a wireless device, the transmission will be sent automatically. After your physician reviews your transmission, you will receive a postcard with your next transmission date.   Any Other Special Instructions Will Be Listed Below (If Applicable).     If you need a refill on your cardiac medications before your next appointment, please call your pharmacy.

## 2017-06-18 NOTE — Progress Notes (Signed)
HPI Henry Neal returns today for followup of his bi-V pacemaker and chronic systolic heart failure. He is a pleasant 83 yo man with a h/o coronary artery disease, severe left ventricular dysfunction, status post biventricular pacemaker secondary to left bundle branch block and an ejection fraction of 15%. He has class II heart failure symptoms. He notes a little dizziness. Minimal peripheral edema. He wears support stockings. Allergies  Allergen Reactions  . Doxepin Other (See Comments)    hallucinations   . Sinequan [Doxepin Hcl] Other (See Comments)    hallucinations  . Verapamil Other (See Comments)    Caused heart to race  . Procaine Hcl Other (See Comments)    Tingling all over as soon as it was injected     Current Outpatient Prescriptions  Medication Sig Dispense Refill  . acetaminophen (TYLENOL) 500 MG tablet Take 1,000 mg by mouth daily as needed for mild pain or headache. Reported on 01/25/2016    . aspirin EC 81 MG tablet Take 81 mg by mouth daily.      Marland Kitchen atorvastatin (LIPITOR) 40 MG tablet Take 1 tablet (40 mg total) by mouth daily at 6 PM. 30 tablet 6  . Calcium Carbonate-Vitamin D (CALCIUM 600 + D PO) Take 1 tablet by mouth 2 (two) times daily.     . carvedilol (COREG) 6.25 MG tablet take 1 tablet by mouth twice a day 180 tablet 3  . clopidogrel (PLAVIX) 75 MG tablet take 1 tablet by mouth once daily WITH BREAKFAST 90 tablet 3  . diclofenac sodium (VOLTAREN) 1 % GEL Apply 2 g topically 4 (four) times daily. 1 Tube 0  . finasteride (PROSCAR) 5 MG tablet take 1 tablet by mouth once daily    . fluticasone (FLONASE) 50 MCG/ACT nasal spray Place 1 spray into both nostrils daily as needed for allergies.     . furosemide (LASIX) 20 MG tablet Take 20 mg (1 tab) once weekly AS NEEDED for weight 167 lbs or more. If needed more than once weekly, call CHF clinic [phone]. 15 tablet 3  . gabapentin (NEURONTIN) 300 MG capsule Take 300 mg by mouth 2 (two) times daily.  0  .  meclizine (ANTIVERT) 25 MG tablet Take 25 mg by mouth 2 (two) times daily as needed for dizziness.     . meloxicam (MOBIC) 15 MG tablet Take 15 mg by mouth daily as needed for pain.  0  . mometasone (ELOCON) 0.1 % cream Apply 1 application topically daily as needed (for irritation).     . Multiple Vitamin (MULTIVITAMIN PO) Take 1 tablet by mouth daily. ALIVE men's vitamin    . nitroGLYCERIN (NITROSTAT) 0.4 MG SL tablet place 1 tablet under the tongue if needed every 5 minutes for chest pain for 3 doses 25 tablet 2  . pantoprazole (PROTONIX) 40 MG tablet Take 40 mg by mouth daily.  0  . polyethylene glycol (MIRALAX / GLYCOLAX) packet Take 17 g by mouth daily.    . sacubitril-valsartan (ENTRESTO) 24-26 MG Take 1 tablet by mouth 2 (two) times daily. 60 tablet 5  . spironolactone (ALDACTONE) 25 MG tablet Take 12.5 mg by mouth daily.    Marland Kitchen tiZANidine (ZANAFLEX) 2 MG tablet Take 2 mg by mouth every 8 (eight) hours as needed. For muscle spasms  0  . triamcinolone cream (KENALOG) 0.1 % Apply 1 application topically 2 (two) times daily as needed (skin).   0   No current facility-administered medications for this visit.  Past Medical History:  Diagnosis Date  . AAA (abdominal aortic aneurysm) ([place])   . Abnormal stress test   . Allergic rhinitis    uses Flonase daily  . Anginal pain (H[facility])    pain in neck,shoulders,down arms occ  . Arthritis   . Cancer ([place])    skin  . Carotid artery occlusion   . Chronic venous insufficiency   . Coronary artery disease   . Dizziness    takes Antivert daily as needed  . ED (erectile dysfunction)   . GERD (gastroesophageal reflux disease)    takes Omeprazole daily  . Hiatal hernia   . Hx of echocardiogram 04/04/2010   showed a borderline dilatd left ventricle with mild left ventricle hypertrophy and an EF 50-55% doppler suggested diastolic dysfunction, although the pattern is probably normal for an 83 yrs old. The left atrium was indeed mildly dilated at  42 mm, but there are no significant valvular abnormalities.   . Hyperlipidemia   . Hypertension    takes Proscar,Dyazide,Avapro,and Metoprolol daily  . Myocardial infarction [facility]) Oct. 11, 2016   Heart Attack  . Palpitation   . Shortness of breath   . Sigmoid diverticulosis   . T12 compression fracture ([place])   . Thyroid disease     ROS:   All systems reviewed and negative except as noted in the HPI.   Past Surgical History:  Procedure Laterality Date  . ABDOMINAL AORTIC ANEURYSM REPAIR  02/24/2009   AAA Stenting  . CARDIAC CATHETERIZATION N/A 08/04/2015   Procedure: Right/Left Heart Cath and Coronary Angiography;  Surgeon: Larey Dresser, MD;  Location: [place] [facility];  Service: Cardiovascular;  Laterality: N/A;  . CARDIAC CATHETERIZATION N/A 08/04/2015   Procedure: Coronary Stent Intervention;  Surgeon: Burnell Blanks, MD;  Location: [place] [facility];  Service: Cardiovascular;  Laterality: N/A;  . CARDIAC CATHETERIZATION N/A 08/08/2015   Procedure: Coronary Stent Intervention;  Surgeon: Peter M Martinique, MD;  Location: [place] [facility];  Service: Cardiovascular;  Laterality: N/A;  . ENDARTERECTOMY Right 10/27/2013   Procedure: ENDARTERECTOMY CAROTID;  Surgeon: Elam Dutch, MD;  Location: [place];  Service: Vascular;  Laterality: Right;  . EP IMPLANTABLE DEVICE N/A 01/26/2016   Procedure: BiV Pacemaker Insertion CRT-P;  Surgeon: Evans Lance, MD;  Location: [place] [facility];  Service: Cardiovascular;  Laterality: N/A;  . HAND SURGERY Right    tendon, lft 90's  . HERNIA REPAIR Bilateral   . LEFT HEART CATHETERIZATION WITH CORONARY ANGIOGRAM N/A 10/12/2013   Procedure: LEFT HEART CATHETERIZATION WITH CORONARY ANGIOGRAM;  Surgeon: Lorretta Harp, MD;  Location: [facility];  Service: Cardiovascular;  Laterality: N/A;  . melanoma removed    . ROTATOR CUFF REPAIR  2003   left arm     Family History  Problem Relation Age of Onset  . Heart disease Mother         before age 40  . COPD Mother   . Diabetes Mother   . Hyperlipidemia Mother   . Hypertension Mother      Social History   Social History  . Marital status: Widowed    Spouse name: N/A  . Number of children: N/A  . Years of education: N/A   Occupational History  . Not on file.   Social History Main Topics  . Smoking status: Former Smoker    Packs/day: 2.00    Years: 30.00    Types: Cigarettes    Quit date: 10/22/1977  .  Smokeless tobacco: Former Systems developer    Quit date: 10/22/1984  . Alcohol use 4.2 oz/week    7 Cans of beer per week  . Drug use: No  . Sexual activity: Not on file   Other Topics Concern  . Not on file   Social History Narrative  . No narrative on file     BP (!) 100/48   Pulse (!) 53   Ht 5\' 10"  (1.778 m)   Wt 171 lb (77.6 kg)   SpO2 97%   BMI 24.54 kg/m   Physical Exam:  Well appearing 83 year old man who looks younger than his stated age, NAD HEENT: Unremarkable Neck:  6 cm JVD, no thyromegally Lymphatics:  No adenopathy Back:  No CVA tenderness Lungs:  Clear, with no wheezes, rales, or rhonchi. HEART:  Regular rate rhythm, no murmurs, no rubs, no clicks Abd:  soft, positive bowel sounds, no organomegally, no rebound, no guarding Ext:  2 plus pulses, no edema, no cyanosis, no clubbing Skin:  No rashes no nodules Neuro:  CN II through XII intact, motor grossly intact  EKG - sinus rhythm with biventricular pacing  DEVICE  Normal device function.  See PaceArt for details.   Assess/Plan: 1. Chronic systolic heart failure - his symptoms are class II and mostly low output in nature. We discussed reducing his dose of beta blocker, but will hold off on this for now. 2. Bi-V pacemaker - St. Jude dual-chamber biventricular pacemaker is working normally. We'll plan to recheck in several months. 3. Dizziness - his blood pressure is a little soft today. I suspect his symptoms are multifactorial and mostly fairly mild. No change in medical  therapy. 4. Coronary artery disease - he remains active and is not having any chest pain or anginal symptoms. He will continue his current medications. He will use nitrates if need be for angina.  Henry Neal, M.D.

## 2017-06-20 ENCOUNTER — Encounter (HOSPITAL_COMMUNITY): Payer: Self-pay

## 2017-06-20 LAB — CUP PACEART INCLINIC DEVICE CHECK
Battery Voltage: 2.99 V
Brady Statistic RA Percent Paced: 11 %
Date Time Interrogation Session: 20180828144233
Implantable Lead Implant Date: 20170406
Implantable Lead Implant Date: 20170406
Implantable Lead Location: 753859
Lead Channel Impedance Value: 487.5 Ohm
Lead Channel Impedance Value: 587.5 Ohm
Lead Channel Pacing Threshold Amplitude: 0.75 V
Lead Channel Pacing Threshold Amplitude: 0.75 V
Lead Channel Pacing Threshold Pulse Width: 0.4 ms
Lead Channel Pacing Threshold Pulse Width: 0.4 ms
Lead Channel Sensing Intrinsic Amplitude: 12 mV
Lead Channel Setting Pacing Amplitude: 2 V
Lead Channel Setting Pacing Amplitude: 2 V
Lead Channel Setting Pacing Pulse Width: 0.4 ms
Lead Channel Setting Sensing Sensitivity: 2 mV
MDC IDC LEAD IMPLANT DT: 20170406
MDC IDC LEAD LOCATION: 753858
MDC IDC LEAD LOCATION: 753860
MDC IDC MSMT BATTERY REMAINING LONGEVITY: 123 mo
MDC IDC MSMT LEADCHNL LV IMPEDANCE VALUE: 912.5 Ohm
MDC IDC MSMT LEADCHNL LV PACING THRESHOLD AMPLITUDE: 0.75 V
MDC IDC MSMT LEADCHNL LV PACING THRESHOLD AMPLITUDE: 0.75 V
MDC IDC MSMT LEADCHNL LV PACING THRESHOLD PULSEWIDTH: 0.4 ms
MDC IDC MSMT LEADCHNL RA PACING THRESHOLD AMPLITUDE: 0.5 V
MDC IDC MSMT LEADCHNL RA PACING THRESHOLD AMPLITUDE: 0.5 V
MDC IDC MSMT LEADCHNL RA PACING THRESHOLD PULSEWIDTH: 0.4 ms
MDC IDC MSMT LEADCHNL RA PACING THRESHOLD PULSEWIDTH: 0.4 ms
MDC IDC MSMT LEADCHNL RA SENSING INTR AMPL: 2.5 mV
MDC IDC MSMT LEADCHNL RV PACING THRESHOLD PULSEWIDTH: 0.4 ms
MDC IDC PG IMPLANT DT: 20170406
MDC IDC SET LEADCHNL RA PACING AMPLITUDE: 1.5 V
MDC IDC SET LEADCHNL RV PACING PULSEWIDTH: 0.4 ms
MDC IDC STAT BRADY RV PERCENT PACED: 99.57 %
Pulse Gen Model: 3262
Pulse Gen Serial Number: 7866838

## 2017-06-21 ENCOUNTER — Encounter (HOSPITAL_COMMUNITY): Payer: Self-pay

## 2017-06-25 ENCOUNTER — Encounter (HOSPITAL_COMMUNITY): Payer: Self-pay

## 2017-06-25 DIAGNOSIS — I5022 Chronic systolic (congestive) heart failure: Secondary | ICD-10-CM | POA: Insufficient documentation

## 2017-06-27 ENCOUNTER — Encounter (HOSPITAL_COMMUNITY)
Admission: RE | Admit: 2017-06-27 | Discharge: 2017-06-27 | Disposition: A | Discharge status: Home / Self Care | Payer: Self-pay | Source: Ambulatory Visit | Attending: Cardiology | Admitting: Cardiology

## 2017-06-28 ENCOUNTER — Encounter (HOSPITAL_COMMUNITY)
Admission: RE | Admit: 2017-06-28 | Discharge: 2017-06-28 | Disposition: A | Discharge status: Home / Self Care | Payer: Self-pay | Source: Ambulatory Visit | Attending: Cardiology | Admitting: Cardiology

## 2017-07-01 DIAGNOSIS — H11433 Conjunctival hyperemia, bilateral: Secondary | ICD-10-CM | POA: Diagnosis not present

## 2017-07-01 DIAGNOSIS — H04223 Epiphora due to insufficient drainage, bilateral lacrimal glands: Secondary | ICD-10-CM | POA: Diagnosis not present

## 2017-07-01 DIAGNOSIS — H04563 Stenosis of bilateral lacrimal punctum: Secondary | ICD-10-CM | POA: Diagnosis not present

## 2017-07-01 DIAGNOSIS — H04123 Dry eye syndrome of bilateral lacrimal glands: Secondary | ICD-10-CM | POA: Diagnosis not present

## 2017-07-02 ENCOUNTER — Encounter (HOSPITAL_COMMUNITY)
Admission: RE | Admit: 2017-07-02 | Discharge: 2017-07-02 | Disposition: A | Discharge status: Home / Self Care | Payer: Self-pay | Source: Ambulatory Visit | Attending: Cardiology | Admitting: Cardiology

## 2017-07-04 ENCOUNTER — Encounter (HOSPITAL_COMMUNITY)
Admission: RE | Admit: 2017-07-04 | Discharge: 2017-07-04 | Disposition: A | Discharge status: Home / Self Care | Payer: Self-pay | Source: Ambulatory Visit | Attending: Cardiology | Admitting: Cardiology

## 2017-07-05 ENCOUNTER — Encounter (HOSPITAL_COMMUNITY): Payer: Self-pay

## 2017-07-05 ENCOUNTER — Encounter: Payer: Self-pay | Admitting: Podiatry

## 2017-07-05 ENCOUNTER — Ambulatory Visit (INDEPENDENT_AMBULATORY_CARE_PROVIDER_SITE_OTHER): Payer: Medicare Other | Admitting: Podiatry

## 2017-07-05 DIAGNOSIS — L84 Corns and callosities: Secondary | ICD-10-CM

## 2017-07-05 DIAGNOSIS — M79676 Pain in unspecified toe(s): Secondary | ICD-10-CM

## 2017-07-05 DIAGNOSIS — I739 Peripheral vascular disease, unspecified: Secondary | ICD-10-CM

## 2017-07-05 DIAGNOSIS — B351 Tinea unguium: Secondary | ICD-10-CM | POA: Diagnosis not present

## 2017-07-05 NOTE — Progress Notes (Signed)
Patient ID: Henry Neal, male   DOB: 1926-10-23, 83 y.o.   MRN: [id number] Complaint:  Visit Type: Patient returns to my office for continued preventative foot care services. Complaint: Patient states" my nails have grown long and thick and become painful to walk and wear shoes"  He presents for preventative foot care services. No changes to ROS  Podiatric Exam: Vascular: dorsalis pedis and posterior tibial pulses are palpable bilateral. Capillary return is immediate. Temperature gradient is WNL. Skin turgor WNL  Sensorium: Normal Semmes Weinstein monofilament test. Normal tactile sensation bilaterally. Nail Exam: Pt has thick disfigured discolored nails with subungual debris noted bilateral entire nail hallux through fifth toenails Ulcer Exam: There is no evidence of ulcer or pre-ulcerative changes or infection. Orthopedic Exam: Muscle tone and strength are WNL. No limitations in general ROM. No crepitus or effusions noted. Foot type and digits show no abnormalities. Bony prominences are unremarkable. Skin: No Porokeratosis. No infection or ulcers. Distal Clavi second toe right.  Diagnosis:  Tinea unguium, Pain in right toe, pain in left toes, Second toe clavi right  Treatment & Plan Procedures and Treatment: Consent by patient was obtained for treatment procedures. The patient understood the discussion of treatment and procedures well. All questions were answered thoroughly reviewed. Debridement of mycotic and hypertrophic toenails, 1 through 5 bilateral and clearing of subungual debris. No ulceration, no infection noted. Clavi asymptomatic today. Return Visit-Office Procedure: Patient instructed to return to the office for a follow up visit 9 weeks  for continued evaluation and treatment.   Gardiner Barefoot DPM

## 2017-07-09 ENCOUNTER — Encounter (HOSPITAL_COMMUNITY): Payer: Self-pay

## 2017-07-11 ENCOUNTER — Encounter (HOSPITAL_COMMUNITY): Payer: Self-pay

## 2017-07-12 ENCOUNTER — Encounter (HOSPITAL_COMMUNITY): Payer: Self-pay

## 2017-07-16 ENCOUNTER — Encounter (HOSPITAL_COMMUNITY): Payer: Self-pay

## 2017-07-18 ENCOUNTER — Encounter (HOSPITAL_COMMUNITY)
Admission: RE | Admit: 2017-07-18 | Discharge: 2017-07-18 | Disposition: A | Discharge status: Home / Self Care | Payer: Self-pay | Source: Ambulatory Visit | Attending: Cardiology | Admitting: Cardiology

## 2017-07-19 ENCOUNTER — Encounter (HOSPITAL_COMMUNITY): Payer: Self-pay

## 2017-07-23 ENCOUNTER — Encounter (HOSPITAL_COMMUNITY)
Admission: RE | Admit: 2017-07-23 | Discharge: 2017-07-23 | Disposition: A | Discharge status: Home / Self Care | Payer: Self-pay | Source: Ambulatory Visit | Attending: Cardiology | Admitting: Cardiology

## 2017-07-23 DIAGNOSIS — I5022 Chronic systolic (congestive) heart failure: Secondary | ICD-10-CM | POA: Insufficient documentation

## 2017-07-25 ENCOUNTER — Encounter (HOSPITAL_COMMUNITY)
Admission: RE | Admit: 2017-07-25 | Discharge: 2017-07-25 | Disposition: A | Discharge status: Home / Self Care | Payer: Self-pay | Source: Ambulatory Visit | Attending: Cardiology | Admitting: Cardiology

## 2017-07-26 ENCOUNTER — Encounter (HOSPITAL_COMMUNITY): Payer: Self-pay

## 2017-07-30 ENCOUNTER — Encounter (HOSPITAL_COMMUNITY): Payer: Self-pay

## 2017-08-01 ENCOUNTER — Encounter (HOSPITAL_COMMUNITY): Payer: Self-pay

## 2017-08-02 ENCOUNTER — Encounter (HOSPITAL_COMMUNITY): Payer: Self-pay

## 2017-08-02 DIAGNOSIS — Z23 Encounter for immunization: Secondary | ICD-10-CM | POA: Diagnosis not present

## 2017-08-06 ENCOUNTER — Encounter (HOSPITAL_COMMUNITY)
Admission: RE | Admit: 2017-08-06 | Discharge: 2017-08-06 | Disposition: A | Discharge status: Home / Self Care | Payer: Self-pay | Source: Ambulatory Visit | Attending: Cardiology | Admitting: Cardiology

## 2017-08-08 ENCOUNTER — Encounter (HOSPITAL_COMMUNITY): Payer: Self-pay

## 2017-08-09 ENCOUNTER — Encounter (HOSPITAL_COMMUNITY): Payer: Self-pay

## 2017-08-11 ENCOUNTER — Other Ambulatory Visit (HOSPITAL_COMMUNITY): Payer: Self-pay | Admitting: Cardiology

## 2017-08-13 ENCOUNTER — Encounter (HOSPITAL_COMMUNITY)
Admission: RE | Admit: 2017-08-13 | Discharge: 2017-08-13 | Disposition: A | Discharge status: Home / Self Care | Payer: Self-pay | Source: Ambulatory Visit | Attending: Cardiology | Admitting: Cardiology

## 2017-08-15 ENCOUNTER — Encounter (HOSPITAL_COMMUNITY)
Admission: RE | Admit: 2017-08-15 | Discharge: 2017-08-15 | Disposition: A | Discharge status: Home / Self Care | Payer: Self-pay | Source: Ambulatory Visit | Attending: Cardiology | Admitting: Cardiology

## 2017-08-16 ENCOUNTER — Encounter (HOSPITAL_COMMUNITY): Payer: Self-pay

## 2017-08-20 ENCOUNTER — Encounter (HOSPITAL_COMMUNITY): Payer: Self-pay

## 2017-08-22 ENCOUNTER — Encounter (HOSPITAL_COMMUNITY): Payer: Self-pay

## 2017-08-22 DIAGNOSIS — M79671 Pain in right foot: Secondary | ICD-10-CM | POA: Diagnosis not present

## 2017-08-22 DIAGNOSIS — M19071 Primary osteoarthritis, right ankle and foot: Secondary | ICD-10-CM | POA: Diagnosis not present

## 2017-08-22 DIAGNOSIS — M79672 Pain in left foot: Secondary | ICD-10-CM | POA: Diagnosis not present

## 2017-08-22 DIAGNOSIS — I5022 Chronic systolic (congestive) heart failure: Secondary | ICD-10-CM | POA: Insufficient documentation

## 2017-08-22 DIAGNOSIS — M19072 Primary osteoarthritis, left ankle and foot: Secondary | ICD-10-CM | POA: Diagnosis not present

## 2017-08-23 ENCOUNTER — Encounter (HOSPITAL_COMMUNITY): Payer: Self-pay

## 2017-08-27 ENCOUNTER — Encounter (HOSPITAL_COMMUNITY)
Admission: RE | Admit: 2017-08-27 | Discharge: 2017-08-27 | Disposition: A | Discharge status: Home / Self Care | Payer: Self-pay | Source: Ambulatory Visit | Attending: Cardiology | Admitting: Cardiology

## 2017-08-28 ENCOUNTER — Ambulatory Visit (INDEPENDENT_AMBULATORY_CARE_PROVIDER_SITE_OTHER): Payer: Medicare Other | Admitting: *Deleted

## 2017-08-28 ENCOUNTER — Other Ambulatory Visit (HOSPITAL_COMMUNITY): Payer: Self-pay | Admitting: *Deleted

## 2017-08-28 ENCOUNTER — Telehealth: Payer: Self-pay | Admitting: Internal Medicine

## 2017-08-28 DIAGNOSIS — I5022 Chronic systolic (congestive) heart failure: Secondary | ICD-10-CM

## 2017-08-28 MED ORDER — SACUBITRIL-VALSARTAN 24-26 MG PO TABS: 1.0000 | ORAL_TABLET | Freq: Two times a day (BID) | ORAL | 5 refills | Status: DC

## 2017-08-28 NOTE — Progress Notes (Signed)
Remote pacemaker transmission.   

## 2017-08-28 NOTE — Telephone Encounter (Signed)
New message     Can you mail him a new pacemaker card? He lost his and needs a new one.

## 2017-08-28 NOTE — Telephone Encounter (Signed)
Spoke w/ pt and informed him that his remote transmission was received and that he will need to call St jude to order the pt ID card. Pt verbalized understanding.

## 2017-08-29 ENCOUNTER — Encounter: Payer: Self-pay | Admitting: Cardiology

## 2017-08-29 ENCOUNTER — Encounter (HOSPITAL_COMMUNITY): Payer: Self-pay

## 2017-08-29 DIAGNOSIS — D692 Other nonthrombocytopenic purpura: Secondary | ICD-10-CM | POA: Diagnosis not present

## 2017-08-29 DIAGNOSIS — Z85828 Personal history of other malignant neoplasm of skin: Secondary | ICD-10-CM | POA: Diagnosis not present

## 2017-08-29 DIAGNOSIS — D485 Neoplasm of uncertain behavior of skin: Secondary | ICD-10-CM | POA: Diagnosis not present

## 2017-08-29 DIAGNOSIS — L920 Granuloma annulare: Secondary | ICD-10-CM | POA: Diagnosis not present

## 2017-08-29 DIAGNOSIS — D0422 Carcinoma in situ of skin of left ear and external auricular canal: Secondary | ICD-10-CM | POA: Diagnosis not present

## 2017-08-29 DIAGNOSIS — L430 Hypertrophic lichen planus: Secondary | ICD-10-CM | POA: Diagnosis not present

## 2017-08-29 DIAGNOSIS — L821 Other seborrheic keratosis: Secondary | ICD-10-CM | POA: Diagnosis not present

## 2017-08-30 ENCOUNTER — Encounter (HOSPITAL_COMMUNITY): Payer: Self-pay

## 2017-08-30 ENCOUNTER — Encounter (HOSPITAL_COMMUNITY): Payer: Self-pay | Admitting: Cardiology

## 2017-08-30 ENCOUNTER — Ambulatory Visit (HOSPITAL_COMMUNITY)
Admission: RE | Admit: 2017-08-30 | Discharge: 2017-08-30 | Disposition: A | Discharge status: Home / Self Care | Payer: Medicare Other | Source: Ambulatory Visit | Attending: Cardiology | Admitting: Cardiology

## 2017-08-30 VITALS — BP 136/64 | HR 65 | Wt 173.1 lb

## 2017-08-30 DIAGNOSIS — E785 Hyperlipidemia, unspecified: Secondary | ICD-10-CM

## 2017-08-30 DIAGNOSIS — R0789 Other chest pain: Secondary | ICD-10-CM | POA: Insufficient documentation

## 2017-08-30 DIAGNOSIS — I5022 Chronic systolic (congestive) heart failure: Secondary | ICD-10-CM

## 2017-08-30 DIAGNOSIS — I252 Old myocardial infarction: Secondary | ICD-10-CM | POA: Insufficient documentation

## 2017-08-30 DIAGNOSIS — I2510 Atherosclerotic heart disease of native coronary artery without angina pectoris: Secondary | ICD-10-CM | POA: Insufficient documentation

## 2017-08-30 DIAGNOSIS — I255 Ischemic cardiomyopathy: Secondary | ICD-10-CM | POA: Insufficient documentation

## 2017-08-30 DIAGNOSIS — I7300 Raynaud's syndrome without gangrene: Secondary | ICD-10-CM | POA: Insufficient documentation

## 2017-08-30 DIAGNOSIS — N189 Chronic kidney disease, unspecified: Secondary | ICD-10-CM | POA: Insufficient documentation

## 2017-08-30 DIAGNOSIS — I2582 Chronic total occlusion of coronary artery: Secondary | ICD-10-CM | POA: Insufficient documentation

## 2017-08-30 DIAGNOSIS — Z8546 Personal history of malignant neoplasm of prostate: Secondary | ICD-10-CM | POA: Insufficient documentation

## 2017-08-30 DIAGNOSIS — Z87891 Personal history of nicotine dependence: Secondary | ICD-10-CM | POA: Diagnosis not present

## 2017-08-30 DIAGNOSIS — I5042 Chronic combined systolic (congestive) and diastolic (congestive) heart failure: Secondary | ICD-10-CM | POA: Insufficient documentation

## 2017-08-30 DIAGNOSIS — Z7982 Long term (current) use of aspirin: Secondary | ICD-10-CM | POA: Insufficient documentation

## 2017-08-30 DIAGNOSIS — Z79899 Other long term (current) drug therapy: Secondary | ICD-10-CM | POA: Diagnosis not present

## 2017-08-30 DIAGNOSIS — Z8679 Personal history of other diseases of the circulatory system: Secondary | ICD-10-CM | POA: Diagnosis not present

## 2017-08-30 DIAGNOSIS — Z8249 Family history of ischemic heart disease and other diseases of the circulatory system: Secondary | ICD-10-CM | POA: Insufficient documentation

## 2017-08-30 DIAGNOSIS — Z7902 Long term (current) use of antithrombotics/antiplatelets: Secondary | ICD-10-CM | POA: Insufficient documentation

## 2017-08-30 LAB — BASIC METABOLIC PANEL
ANION GAP: 5 (ref 5–15)
BUN: 16 mg/dL (ref 6–20)
CHLORIDE: 103 mmol/L (ref 101–111)
CO2: 27 mmol/L (ref 22–32)
CREATININE: 0.96 mg/dL (ref 0.61–1.24)
Calcium: 8.9 mg/dL (ref 8.9–10.3)
GFR calc non Af Amer: >60 mL/min (ref >60)
Glucose, Bld: 122 mg/dL — ABNORMAL HIGH (ref 65–99)
Potassium: 3.5 mmol/L (ref 3.5–5.1)
Sodium: 135 mmol/L (ref 135–145)

## 2017-08-30 LAB — LIPID PANEL
Cholesterol: 100 mg/dL (ref 0–200)
HDL: 47 mg/dL (ref >40)
LDL CALC: 40 mg/dL (ref 0–99)
Total CHOL/HDL Ratio: 2.1 RATIO
Triglycerides: 67 mg/dL (ref <150)
VLDL: 13 mg/dL (ref 0–40)

## 2017-08-30 NOTE — Progress Notes (Signed)
Patient ID: Henry Neal, male   DOB: 01-Oct-1927, 83 y.o.   MRN: [id number]      Advanced Heart Failure Clinic Note  PCP: Dr. Ashby Dawes Cardiology: Dr. Thresa Ross is a 83 y.o. male with history of CAD, chronic systolic CHF, LBBB, AAA s/p repair presents for followup of CHF and CAD.  He had a cardiac cath in 12/15, showing total occlusion of the LAD and 50-60% proximal RCA.  EF at that time was near-normal.  In 10/16, he developed dyspnea and epigastric pain.  He was noted to be in acute pulmonary edema and was diuresed.  Troponin was only mildly elevated at 0.47.  He ultimately underwent LHC showing hazy 80-90% RCA stenosis which was likely the culprit for his presentation.  Additionally, the LAD was only subtotally occluded on this cath.  He had DES to RCA.  Cardiac MRI was done, showing EF 22% with significant viability in LAD territory.  Therefore, CTO of LAD was opened with DES.  Echo in 2/17 showed EF persistently low at 15%.  He had St Jude CRT-P placement in 4/17. Last echo in 1/18 showed EF up to 50-55% with mild to moderate MR.   He has been doing well since last appointment.  He is still working Advertising account planner, same company for 52 years now).  No significant exertional dyspnea.  No orthopnea/PND.  Rare atypical chest pain.     Labs (10/16): hgb 9.7, K 4.6, creatinine 1.12 Labs (11/16): K 5.9, creatinine 1.5 Labs (12/16); K 4.2, creatinine 1.07 => 1.02, BNP 448 Labs (1/17): K 4.1, creatinine 1.13, BNP 423, LDL 40, HDL 47 Labs (4/17): K 3.9, creatinine 1.03, HCT 32.6 Labs (5/17): K 4.4, creatinine 1.09 Labs (1/18): LDL 37 Labs (01/25/2017): K 3.5 Creatinine 0.96  Labs (8/18): BNP 30, K 3.7, creatinine 1.0  St Jude device interrogation: Stable thoracic impedance.  >99% BiV pacing. No AF/AT.   PMH: 1. CKD 2. AAA s/p repair, followed at [facility]. 3. Carotid stenosis: s/p right CEA.   Carotid dopplers (2/16) with right CEA stable, < 79% LICA stenosis.  Followed at [facility].  - Carotid  dopplers (0/24): 0-97% LICA, patent right CEA.  4. Chronic LBBB 5. LAD: LHC (12/15) with totally occluded LAD, 50-60% pRCA => medically managed.  10/16 admitted with NSTEMI.  LHC with subtotalled LAD occlusion, 80-90% pRCA with thrombus => DES RCA initially.  Cardiac MRI was done showing viability in the LAD territory, so he had DES to the CTO LAD.   6. Chronic systolic CHF: Ischemic cardiomyopathy.  Echo (10/16) with EF 15% with wall motion abnormalities, moderately dilated RV with normal systolic function.  CMRI (10/16) with severe LV dilation, EF 22% with septal-lateral dyssynchrony, no LV thrombus, normal RV size/systolic function => significant viability noted in the LAD territory.  Echo (2/17): EF 15%, severe LV dilation, wall motion abnormalities noted, mild MR, mildly dilated RV with normal systolic function, PASP 55 mmHg.  St Jude CRT-P 4/17.  - Echo (1/18): EF 50-55%, mild to moderate MR.  7. ABIs (10/16) were normal.  8. Raynaud's syndrome.  9. Sciatica 10. ECHO 11/16/2016 EF 50-55% Grade IIDD  SH: Lives alone, prior smoker, still working as a Furniture conservator/restorer.  FH: CAD  Review of systems complete and found to be negative unless listed in HPI.    Current Outpatient Medications  Medication Sig Dispense Refill  . acetaminophen (TYLENOL) 500 MG tablet Take 1,000 mg by mouth daily as needed for mild pain or headache. Reported  on 01/25/2016    . aspirin EC 81 MG tablet Take 81 mg by mouth daily.      Marland Kitchen atorvastatin (LIPITOR) 40 MG tablet Take 1 tablet (40 mg total) by mouth daily at 6 PM. 30 tablet 6  . Calcium Carbonate-Vitamin D (CALCIUM 600 + D PO) Take 1 tablet by mouth 2 (two) times daily.     . carvedilol (COREG) 6.25 MG tablet take 1 tablet by mouth twice a day 180 tablet 3  . clopidogrel (PLAVIX) 75 MG tablet take 1 tablet by mouth once daily WITH BREAKFAST 90 tablet 3  . diclofenac sodium (VOLTAREN) 1 % GEL Apply 2 g topically 4 (four) times daily. 1 Tube 0  . finasteride (PROSCAR) 5 MG  tablet take 1 tablet by mouth once daily    . fluticasone (FLONASE) 50 MCG/ACT nasal spray Place 1 spray into both nostrils daily as needed for allergies.     . furosemide (LASIX) 20 MG tablet Take 20 mg (1 tab) once weekly AS NEEDED for weight 167 lbs or more. If needed more than once weekly, call CHF clinic [phone]. 15 tablet 3  . gabapentin (NEURONTIN) 300 MG capsule Take 300 mg by mouth 2 (two) times daily.  0  . meclizine (ANTIVERT) 25 MG tablet Take 25 mg by mouth 2 (two) times daily as needed for dizziness.     . meloxicam (MOBIC) 15 MG tablet Take 15 mg by mouth daily as needed for pain.  0  . mometasone (ELOCON) 0.1 % cream Apply 1 application topically daily as needed (for irritation).     . Multiple Vitamin (MULTIVITAMIN PO) Take 1 tablet by mouth daily. ALIVE men's vitamin    . nitroGLYCERIN (NITROSTAT) 0.4 MG SL tablet place 1 tablet under the tongue if needed every 5 minutes for chest pain for 3 doses 25 tablet 2  . pantoprazole (PROTONIX) 40 MG tablet Take 40 mg by mouth daily.  0  . polyethylene glycol (MIRALAX / GLYCOLAX) packet Take 17 g by mouth daily.    . sacubitril-valsartan (ENTRESTO) 24-26 MG Take 1 tablet 2 (two) times daily by mouth. 60 tablet 5  . spironolactone (ALDACTONE) 25 MG tablet Take 12.5 mg by mouth daily.    Marland Kitchen tiZANidine (ZANAFLEX) 2 MG tablet Take 2 mg by mouth every 8 (eight) hours as needed. For muscle spasms  0  . triamcinolone cream (KENALOG) 0.1 % Apply 1 application topically 2 (two) times daily as needed (skin).   0   No current facility-administered medications for this encounter.    BP 136/64   Pulse 65   Wt 173 lb 1.9 oz (78.5 kg)   SpO2 95%   BMI 24.84 kg/m   Wt Readings from Last 3 Encounters:  08/30/17 173 lb 1.9 oz (78.5 kg)  06/18/17 171 lb (77.6 kg)  05/27/17 175 lb (79.4 kg)    General: NAD Neck: No JVD, no thyromegaly or thyroid nodule.  Lungs: Clear to auscultation bilaterally with normal respiratory effort. CV:  Nondisplaced PMI.  Heart regular S1/S2, no S3/S4, no murmur.  Trace edema.  No carotid bruit.  Normal pedal pulses.  Abdomen: Soft, nontender, no hepatosplenomegaly, no distention.  Skin: Intact without lesions or rashes.  Neurologic: Alert and oriented x 3.  Psych: Normal affect. Extremities: No clubbing or cyanosis.  HEENT: Normal.   Assessment/Plan: 1. CAD: NSTEMI with DES to RCA (culprit vessel) and subsequent DES to CTO of LAD (cMRI showed viability in the LAD distribution) in 10/16. Rare  atypical chest pain.  - Continue ASA 81 and Plavix 75.  - atorvastatin 40 daily. Check lipids.   2. Chronic systolic/diastolic heart failure: Ischemic cardiomyopathy. Echo 1/18 with EF improved to 50-55%.  St Jude CRT-D. Volume status stable by exam and Corevue.  NYHA class II symptoms.  - Continue lasix 20 mg po as needed for weight over 167 lbs at home.  He has not needed Lasix.  - Continue coreg 6.25 mg BID - Continue spiro 12.5 mg daily - Continue Entresto 24/26 mg BID.  - Continue Cardiac Rehab maintenance program.  - BMET today.  - Repeat echo in 1/19 to make sure EF remains near normal.  3. Carotid stenosis:  - Has follow up at [facility].  Followup in 4 months.   Loralie Champagne, MD  08/30/2017

## 2017-08-30 NOTE — Patient Instructions (Signed)
Labs drawn today (if we do not call you, then your lab work was stable)   Your physician has requested that you have an echocardiogram. Echocardiography is a painless test that uses sound waves to create images of your heart. It provides your doctor with information about the size and shape of your heart and how well your heart's chambers and valves are working. This procedure takes approximately one hour. There are no restrictions for this procedure.   Your physician recommends that you schedule a follow-up appointment in: 4 months with Dr. Aundra Dubin and an echocardiogram

## 2017-09-03 ENCOUNTER — Encounter (HOSPITAL_COMMUNITY): Payer: Self-pay

## 2017-09-04 ENCOUNTER — Ambulatory Visit: Payer: Medicare Other | Admitting: Podiatry

## 2017-09-05 ENCOUNTER — Encounter (HOSPITAL_COMMUNITY): Payer: Self-pay

## 2017-09-06 ENCOUNTER — Encounter (HOSPITAL_COMMUNITY): Payer: Self-pay

## 2017-09-10 ENCOUNTER — Other Ambulatory Visit: Payer: Self-pay | Admitting: Cardiology

## 2017-09-10 ENCOUNTER — Encounter (HOSPITAL_COMMUNITY)
Admission: RE | Admit: 2017-09-10 | Discharge: 2017-09-10 | Disposition: A | Discharge status: Home / Self Care | Payer: Self-pay | Source: Ambulatory Visit | Attending: Cardiology | Admitting: Cardiology

## 2017-09-10 NOTE — Telephone Encounter (Signed)
REFILL 

## 2017-09-15 LAB — CUP PACEART REMOTE DEVICE CHECK
Battery Remaining Longevity: 125 mo
Battery Remaining Percentage: 95.5 %
Battery Voltage: 2.99 V
Brady Statistic AP VS Percent: 1 %
Brady Statistic RA Percent Paced: 8.2 %
Implantable Lead Implant Date: 20170406
Implantable Lead Location: 753858
Implantable Lead Location: 753859
Implantable Pulse Generator Implant Date: 20170406
Lead Channel Impedance Value: 480 Ohm
Lead Channel Impedance Value: 910 Ohm
Lead Channel Pacing Threshold Amplitude: 0.5 V
Lead Channel Pacing Threshold Pulse Width: 0.4 ms
Lead Channel Sensing Intrinsic Amplitude: 12 mV
Lead Channel Sensing Intrinsic Amplitude: 2.3 mV
Lead Channel Setting Pacing Amplitude: 2 V
Lead Channel Setting Pacing Amplitude: 2 V
Lead Channel Setting Pacing Pulse Width: 0.4 ms
MDC IDC LEAD IMPLANT DT: 20170406
MDC IDC LEAD IMPLANT DT: 20170406
MDC IDC LEAD LOCATION: 753860
MDC IDC MSMT LEADCHNL LV PACING THRESHOLD AMPLITUDE: 1 V
MDC IDC MSMT LEADCHNL LV PACING THRESHOLD PULSEWIDTH: 0.4 ms
MDC IDC MSMT LEADCHNL RV IMPEDANCE VALUE: 580 Ohm
MDC IDC MSMT LEADCHNL RV PACING THRESHOLD AMPLITUDE: 0.625 V
MDC IDC MSMT LEADCHNL RV PACING THRESHOLD PULSEWIDTH: 0.4 ms
MDC IDC PG SERIAL: 7866838
MDC IDC SESS DTM: 20181107065014
MDC IDC SET LEADCHNL RA PACING AMPLITUDE: 1.5 V
MDC IDC SET LEADCHNL RV PACING PULSEWIDTH: 0.4 ms
MDC IDC SET LEADCHNL RV SENSING SENSITIVITY: 2 mV
MDC IDC STAT BRADY AP VP PERCENT: 8.5 %
MDC IDC STAT BRADY AS VP PERCENT: 91 %
MDC IDC STAT BRADY AS VS PERCENT: 1 %

## 2017-09-16 DIAGNOSIS — R7303 Prediabetes: Secondary | ICD-10-CM | POA: Diagnosis not present

## 2017-09-16 DIAGNOSIS — I2510 Atherosclerotic heart disease of native coronary artery without angina pectoris: Secondary | ICD-10-CM | POA: Diagnosis not present

## 2017-09-16 DIAGNOSIS — Z0000 Encounter for general adult medical examination without abnormal findings: Secondary | ICD-10-CM | POA: Diagnosis not present

## 2017-09-16 DIAGNOSIS — E782 Mixed hyperlipidemia: Secondary | ICD-10-CM | POA: Diagnosis not present

## 2017-09-17 ENCOUNTER — Encounter (HOSPITAL_COMMUNITY): Payer: Self-pay

## 2017-09-19 ENCOUNTER — Encounter (HOSPITAL_COMMUNITY)
Admission: RE | Admit: 2017-09-19 | Discharge: 2017-09-19 | Disposition: A | Discharge status: Home / Self Care | Payer: Self-pay | Source: Ambulatory Visit | Attending: Cardiology | Admitting: Cardiology

## 2017-09-20 ENCOUNTER — Encounter (HOSPITAL_COMMUNITY)
Admission: RE | Admit: 2017-09-20 | Discharge: 2017-09-20 | Disposition: A | Discharge status: Home / Self Care | Payer: Self-pay | Source: Ambulatory Visit | Attending: Cardiology | Admitting: Cardiology

## 2017-09-23 DIAGNOSIS — I10 Essential (primary) hypertension: Secondary | ICD-10-CM | POA: Diagnosis not present

## 2017-09-23 DIAGNOSIS — I739 Peripheral vascular disease, unspecified: Secondary | ICD-10-CM | POA: Diagnosis not present

## 2017-09-23 DIAGNOSIS — I2510 Atherosclerotic heart disease of native coronary artery without angina pectoris: Secondary | ICD-10-CM | POA: Diagnosis not present

## 2017-09-23 DIAGNOSIS — E782 Mixed hyperlipidemia: Secondary | ICD-10-CM | POA: Diagnosis not present

## 2017-09-24 ENCOUNTER — Other Ambulatory Visit: Payer: Self-pay | Admitting: Internal Medicine

## 2017-10-02 ENCOUNTER — Ambulatory Visit (INDEPENDENT_AMBULATORY_CARE_PROVIDER_SITE_OTHER): Payer: Medicare Other | Admitting: Podiatry

## 2017-10-02 ENCOUNTER — Encounter (HOSPITAL_COMMUNITY): Admission: RE | Admit: 2017-10-02 | Payer: Self-pay | Source: Ambulatory Visit

## 2017-10-02 ENCOUNTER — Encounter: Payer: Self-pay | Admitting: Podiatry

## 2017-10-02 DIAGNOSIS — B351 Tinea unguium: Secondary | ICD-10-CM | POA: Diagnosis not present

## 2017-10-02 DIAGNOSIS — I739 Peripheral vascular disease, unspecified: Secondary | ICD-10-CM

## 2017-10-02 DIAGNOSIS — I5022 Chronic systolic (congestive) heart failure: Secondary | ICD-10-CM | POA: Insufficient documentation

## 2017-10-02 DIAGNOSIS — M79676 Pain in unspecified toe(s): Secondary | ICD-10-CM

## 2017-10-02 NOTE — Progress Notes (Signed)
Patient ID: Henry Neal, male   DOB: Apr 15, 1927, 83 y.o.   MRN: [id number] Complaint:  Visit Type: Patient returns to my office for continued preventative foot care services. Complaint: Patient states" my nails have grown long and thick and become painful to walk and wear shoes"  He presents for preventative foot care services. No changes to ROS  Podiatric Exam: Vascular: dorsalis pedis and posterior tibial pulses are palpable bilateral. Capillary return is immediate. Temperature gradient is WNL. Skin turgor WNL  Sensorium: Normal Semmes Weinstein monofilament test. Normal tactile sensation bilaterally. Nail Exam: Pt has thick disfigured discolored nails with subungual debris noted bilateral entire nail hallux through fifth toenails Ulcer Exam: There is no evidence of ulcer or pre-ulcerative changes or infection. Orthopedic Exam: Muscle tone and strength are WNL. No limitations in general ROM. No crepitus or effusions noted. Foot type and digits show no abnormalities. Bony prominences are unremarkable. Skin: No Porokeratosis. No infection or ulcers. Distal Clavi second toe right.  Diagnosis:  Tinea unguium, Pain in right toe, pain in left toes, Second toe clavi right  Treatment & Plan Procedures and Treatment: Consent by patient was obtained for treatment procedures. The patient understood the discussion of treatment and procedures well. All questions were answered thoroughly reviewed. Debridement of mycotic and hypertrophic toenails, 1 through 5 bilateral and clearing of subungual debris. No ulceration, no infection noted. Clavi asymptomatic today. Return Visit-Office Procedure: Patient instructed to return to the office for a follow up visit 10  weeks  for continued evaluation and treatment.   Gardiner Barefoot DPM

## 2017-10-18 ENCOUNTER — Other Ambulatory Visit (HOSPITAL_COMMUNITY): Payer: Self-pay | Admitting: Cardiology

## 2017-10-24 ENCOUNTER — Encounter (HOSPITAL_COMMUNITY)
Admission: RE | Admit: 2017-10-24 | Discharge: 2017-10-24 | Disposition: A | Discharge status: Home / Self Care | Payer: Self-pay | Source: Ambulatory Visit | Attending: Cardiology | Admitting: Cardiology

## 2017-10-24 DIAGNOSIS — I5022 Chronic systolic (congestive) heart failure: Secondary | ICD-10-CM | POA: Insufficient documentation

## 2017-10-25 ENCOUNTER — Encounter (HOSPITAL_COMMUNITY)
Admission: RE | Admit: 2017-10-25 | Discharge: 2017-10-25 | Disposition: A | Discharge status: Home / Self Care | Payer: Self-pay | Source: Ambulatory Visit | Attending: Cardiology | Admitting: Cardiology

## 2017-10-30 ENCOUNTER — Ambulatory Visit (HOSPITAL_COMMUNITY)
Admission: RE | Admit: 2017-10-30 | Discharge: 2017-10-30 | Disposition: A | Discharge status: Home / Self Care | Payer: Medicare Other | Source: Ambulatory Visit | Attending: Internal Medicine | Admitting: Internal Medicine

## 2017-10-30 DIAGNOSIS — I110 Hypertensive heart disease with heart failure: Secondary | ICD-10-CM | POA: Insufficient documentation

## 2017-10-30 DIAGNOSIS — E119 Type 2 diabetes mellitus without complications: Secondary | ICD-10-CM | POA: Insufficient documentation

## 2017-10-30 DIAGNOSIS — I5022 Chronic systolic (congestive) heart failure: Secondary | ICD-10-CM | POA: Diagnosis not present

## 2017-10-30 DIAGNOSIS — I252 Old myocardial infarction: Secondary | ICD-10-CM | POA: Diagnosis not present

## 2017-10-30 DIAGNOSIS — E785 Hyperlipidemia, unspecified: Secondary | ICD-10-CM | POA: Diagnosis not present
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
## 2017-11-18 ENCOUNTER — Telehealth (HOSPITAL_COMMUNITY): Payer: Self-pay | Admitting: Pharmacist

## 2017-11-18 NOTE — Telephone Encounter (Signed)
Entresto PA not required per [employer].   Ruta Hinds. Velva Harman, PharmD, BCPS, CPP Clinical Pharmacist Phone: [phone] 11/18/2017 3:15 PM

## 2017-11-19 ENCOUNTER — Telehealth (HOSPITAL_COMMUNITY): Payer: Self-pay | Admitting: *Deleted

## 2017-11-19 DIAGNOSIS — H52223 Regular astigmatism, bilateral: Secondary | ICD-10-CM | POA: Diagnosis not present

## 2017-11-21 ENCOUNTER — Encounter (HOSPITAL_COMMUNITY)
Admission: RE | Admit: 2017-11-21 | Discharge: 2017-11-21 | Disposition: A | Discharge status: Home / Self Care | Payer: Self-pay | Source: Ambulatory Visit | Attending: Cardiology | Admitting: Cardiology

## 2017-11-22 ENCOUNTER — Encounter (HOSPITAL_COMMUNITY)
Admission: RE | Admit: 2017-11-22 | Discharge: 2017-11-22 | Disposition: A | Discharge status: Home / Self Care | Payer: Self-pay | Source: Ambulatory Visit | Attending: Cardiology | Admitting: Cardiology

## 2017-11-22 DIAGNOSIS — I5022 Chronic systolic (congestive) heart failure: Secondary | ICD-10-CM | POA: Insufficient documentation

## 2017-11-25 ENCOUNTER — Encounter (HOSPITAL_COMMUNITY): Admission: RE | Admit: 2017-11-25 | Payer: Self-pay | Source: Ambulatory Visit

## 2017-11-26 ENCOUNTER — Encounter (HOSPITAL_COMMUNITY)
Admission: RE | Admit: 2017-11-26 | Discharge: 2017-11-26 | Disposition: A | Discharge status: Home / Self Care | Payer: Self-pay | Source: Ambulatory Visit | Attending: Cardiology | Admitting: Cardiology

## 2017-11-27 ENCOUNTER — Ambulatory Visit (INDEPENDENT_AMBULATORY_CARE_PROVIDER_SITE_OTHER): Payer: Medicare Other | Admitting: *Deleted

## 2017-11-27 DIAGNOSIS — I5022 Chronic systolic (congestive) heart failure: Secondary | ICD-10-CM | POA: Diagnosis not present

## 2017-11-27 NOTE — Progress Notes (Signed)
Remote pacemaker transmission.   

## 2017-11-28 ENCOUNTER — Encounter: Payer: Self-pay | Admitting: Cardiology

## 2017-12-03 ENCOUNTER — Encounter (HOSPITAL_COMMUNITY)
Admission: RE | Admit: 2017-12-03 | Discharge: 2017-12-03 | Disposition: A | Discharge status: Home / Self Care | Payer: Self-pay | Source: Ambulatory Visit | Attending: Cardiology | Admitting: Cardiology

## 2017-12-13 ENCOUNTER — Encounter (HOSPITAL_COMMUNITY)
Admission: RE | Admit: 2017-12-13 | Discharge: 2017-12-13 | Disposition: A | Discharge status: Home / Self Care | Payer: Self-pay | Source: Ambulatory Visit | Attending: Cardiology | Admitting: Cardiology

## 2017-12-17 ENCOUNTER — Encounter (HOSPITAL_COMMUNITY)
Admission: RE | Admit: 2017-12-17 | Discharge: 2017-12-17 | Disposition: A | Discharge status: Home / Self Care | Payer: Self-pay | Source: Ambulatory Visit | Attending: Cardiology | Admitting: Cardiology

## 2017-12-19 ENCOUNTER — Encounter (HOSPITAL_COMMUNITY)
Admission: RE | Admit: 2017-12-19 | Discharge: 2017-12-19 | Disposition: A | Discharge status: Home / Self Care | Payer: Self-pay | Source: Ambulatory Visit | Attending: Cardiology | Admitting: Cardiology

## 2017-12-20 ENCOUNTER — Encounter (HOSPITAL_COMMUNITY)
Admission: RE | Admit: 2017-12-20 | Discharge: 2017-12-20 | Disposition: A | Discharge status: Home / Self Care | Payer: Self-pay | Source: Ambulatory Visit | Attending: Cardiology | Admitting: Cardiology

## 2017-12-20 DIAGNOSIS — I2510 Atherosclerotic heart disease of native coronary artery without angina pectoris: Secondary | ICD-10-CM | POA: Insufficient documentation

## 2017-12-24 ENCOUNTER — Encounter (HOSPITAL_COMMUNITY): Payer: Self-pay

## 2017-12-26 ENCOUNTER — Encounter (HOSPITAL_COMMUNITY): Payer: Self-pay

## 2017-12-27 ENCOUNTER — Encounter (HOSPITAL_COMMUNITY): Payer: Medicare Other | Admitting: Cardiology

## 2017-12-27 ENCOUNTER — Encounter (HOSPITAL_COMMUNITY): Payer: Self-pay | Admitting: Cardiology

## 2017-12-27 ENCOUNTER — Other Ambulatory Visit: Payer: Self-pay

## 2017-12-27 ENCOUNTER — Ambulatory Visit (HOSPITAL_COMMUNITY)
Admission: RE | Admit: 2017-12-27 | Discharge: 2017-12-27 | Disposition: A | Discharge status: Home / Self Care | Payer: Medicare Other | Source: Ambulatory Visit | Attending: Cardiology | Admitting: Cardiology

## 2017-12-27 VITALS — BP 108/48 | HR 54 | Wt 175.2 lb

## 2017-12-27 DIAGNOSIS — I2510 Atherosclerotic heart disease of native coronary artery without angina pectoris: Secondary | ICD-10-CM | POA: Diagnosis not present

## 2017-12-27 DIAGNOSIS — I5042 Chronic combined systolic (congestive) and diastolic (congestive) heart failure: Secondary | ICD-10-CM | POA: Insufficient documentation

## 2017-12-27 DIAGNOSIS — I2582 Chronic total occlusion of coronary artery: Secondary | ICD-10-CM | POA: Insufficient documentation

## 2017-12-27 DIAGNOSIS — E785 Hyperlipidemia, unspecified: Secondary | ICD-10-CM | POA: Diagnosis not present

## 2017-12-27 DIAGNOSIS — Z87891 Personal history of nicotine dependence: Secondary | ICD-10-CM | POA: Diagnosis not present

## 2017-12-27 DIAGNOSIS — Z79899 Other long term (current) drug therapy: Secondary | ICD-10-CM | POA: Diagnosis not present

## 2017-12-27 DIAGNOSIS — Z7902 Long term (current) use of antithrombotics/antiplatelets: Secondary | ICD-10-CM | POA: Insufficient documentation

## 2017-12-27 DIAGNOSIS — Z7982 Long term (current) use of aspirin: Secondary | ICD-10-CM | POA: Diagnosis not present

## 2017-12-27 DIAGNOSIS — Z8679 Personal history of other diseases of the circulatory system: Secondary | ICD-10-CM | POA: Diagnosis not present

## 2017-12-27 DIAGNOSIS — I252 Old myocardial infarction: Secondary | ICD-10-CM | POA: Diagnosis not present

## 2017-12-27 DIAGNOSIS — N189 Chronic kidney disease, unspecified: Secondary | ICD-10-CM | POA: Diagnosis not present

## 2017-12-27 DIAGNOSIS — I7300 Raynaud's syndrome without gangrene: Secondary | ICD-10-CM | POA: Diagnosis not present

## 2017-12-27 DIAGNOSIS — I255 Ischemic cardiomyopathy: Secondary | ICD-10-CM | POA: Insufficient documentation

## 2017-12-27 DIAGNOSIS — I6529 Occlusion and stenosis of unspecified carotid artery: Secondary | ICD-10-CM | POA: Insufficient documentation

## 2017-12-27 DIAGNOSIS — Z9889 Other specified postprocedural states: Secondary | ICD-10-CM | POA: Insufficient documentation

## 2017-12-27 DIAGNOSIS — I5022 Chronic systolic (congestive) heart failure: Secondary | ICD-10-CM

## 2017-12-27 DIAGNOSIS — Z8249 Family history of ischemic heart disease and other diseases of the circulatory system: Secondary | ICD-10-CM | POA: Insufficient documentation

## 2017-12-27 DIAGNOSIS — Z8546 Personal history of malignant neoplasm of prostate: Secondary | ICD-10-CM | POA: Insufficient documentation

## 2017-12-27 LAB — BASIC METABOLIC PANEL
Anion gap: 10 (ref 5–15)
BUN: 16 mg/dL (ref 6–20)
CALCIUM: 9 mg/dL (ref 8.9–10.3)
CO2: 23 mmol/L (ref 22–32)
CREATININE: 0.97 mg/dL (ref 0.61–1.24)
Chloride: 103 mmol/L (ref 101–111)
GFR calc Af Amer: >60 mL/min (ref >60)
Glucose, Bld: 110 mg/dL — ABNORMAL HIGH (ref 65–99)
POTASSIUM: 4 mmol/L (ref 3.5–5.1)
SODIUM: 136 mmol/L (ref 135–145)

## 2017-12-27 NOTE — Patient Instructions (Signed)
Labs drawn today (if we do not call you, then your lab work was stable)   Your physician recommends that you schedule a follow-up appointment in: 4 months (July, 2019) with Dr. Aundra Dubin Please Call an Schedule Appointment

## 2017-12-27 NOTE — Progress Notes (Signed)
Medication Samples have been provided to the patient.  Drug name: Delene Loll       Strength: 24/26        Qty: 14  LOT [id number]: Exp.Date: 6/21  Dosing instructions: 1 tab twice a day  The patient has been instructed regarding the correct time, dose, and frequency of taking this medication, including desired effects and most common side effects.   Henry Neal 11:17 AM 12/27/2017

## 2017-12-28 NOTE — Progress Notes (Signed)
Patient ID: Henry Neal, male   DOB: [birth year], 84 y.o.   MRN: [id number]      Advanced Heart Failure Clinic Note  PCP: Dr. Ashby Dawes Cardiology: Dr. Thresa Ross is a 84 y.o. male with history of CAD, chronic systolic CHF, LBBB, AAA s/p repair presents for followup of CHF and CAD.  He had a cardiac cath in 12/15, showing total occlusion of the LAD and 50-60% proximal RCA.  EF at that time was near-normal.  In 10/16, he developed dyspnea and epigastric pain.  He was noted to be in acute pulmonary edema and was diuresed.  Troponin was only mildly elevated at 0.47.  He ultimately underwent LHC showing hazy 80-90% RCA stenosis which was likely the culprit for his presentation.  Additionally, the LAD was only subtotally occluded on this cath.  He had DES to RCA.  Cardiac MRI was done, showing EF 22% with significant viability in LAD territory.  Therefore, CTO of LAD was opened with DES.  Echo in 2/17 showed EF persistently low at 15%.  He had St Jude CRT-P placement in 4/17. Last echo in 1/19 showed EF up to 45-50% with mild MR.   He has been doing well overall.  Still works as a Furniture conservator/restorer.  Going to maintenance cardiac rehab, walks 16 laps around the track without problems.  No significant exertional dyspnea or chest pain.  No orthopnea/PND.  No BRBPR/melena.  He takes Lasix about 2-3 times/month.  Occasional palpitations, no lightheadedness.  Weight up 2 lbs.     Labs (10/16): hgb 9.7, K 4.6, creatinine 1.12 Labs (11/16): K 5.9, creatinine 1.5 Labs (12/16); K 4.2, creatinine 1.07 => 1.02, BNP 448 Labs (1/17): K 4.1, creatinine 1.13, BNP 423, LDL 40, HDL 47 Labs (4/17): K 3.9, creatinine 1.03, HCT 32.6 Labs (5/17): K 4.4, creatinine 1.09 Labs (1/18): LDL 37 Labs (01/25/2017): K 3.5 Creatinine 0.96  Labs (8/18): BNP 30, K 3.7, creatinine 1.0 Labs (11/18): LDL 40, HDL 47, K 3.5, creatinine 0.96   St Jude device interrogation: Stable thoracic impedance.  No AF.  > 99% BiV pacing.    PMH: 1. CKD 2. AAA s/p repair, followed at [facility]. 3. Carotid stenosis: s/p right CEA.   Carotid dopplers (2/16) with right CEA stable, < 40% LICA stenosis.  Followed at [facility].  - Carotid dopplers (8/14): 4-81% LICA, patent right CEA.  4. Chronic LBBB 5. LAD: LHC (12/15) with totally occluded LAD, 50-60% pRCA => medically managed.  10/16 admitted with NSTEMI.  LHC with subtotalled LAD occlusion, 80-90% pRCA with thrombus => DES RCA initially.  Cardiac MRI was done showing viability in the LAD territory, so he had DES to the CTO LAD.   6. Chronic systolic CHF: Ischemic cardiomyopathy.  Echo (10/16) with EF 15% with wall motion abnormalities, moderately dilated RV with normal systolic function.  CMRI (10/16) with severe LV dilation, EF 22% with septal-lateral dyssynchrony, no LV thrombus, normal RV size/systolic function => significant viability noted in the LAD territory.  Echo (2/17): EF 15%, severe LV dilation, wall motion abnormalities noted, mild MR, mildly dilated RV with normal systolic function, PASP 55 mmHg.  St Jude CRT-P 4/17.  - Echo (1/18): EF 50-55%, mild to moderate MR.  - Echo (1/19): EF 45-50%, mild LVH, inferior hypokinesis, mild MR, PASP 30 mmHg.  7. ABIs (10/16) were normal.  8. Raynaud's syndrome.  9. Sciatica  SH: Lives alone, prior smoker, still working as a Furniture conservator/restorer.  FH: CAD  Review of  systems complete and found to be negative unless listed in HPI.    Current Outpatient Medications  Medication Sig Dispense Refill  . acetaminophen (TYLENOL) 500 MG tablet Take 1,000 mg by mouth daily as needed for mild pain or headache. Reported on 01/25/2016    . aspirin EC 81 MG tablet Take 81 mg by mouth daily.      Marland Kitchen atorvastatin (LIPITOR) 40 MG tablet Take 1 tablet (40 mg total) by mouth daily at 6 PM. 30 tablet 6  . Calcium Carbonate-Vitamin D (CALCIUM 600 + D PO) Take 1 tablet by mouth 2 (two) times daily.     . carvedilol (COREG) 6.25 MG tablet take 1 tablet by mouth twice a day  180 tablet 3  . clopidogrel (PLAVIX) 75 MG tablet take 1 tablet by mouth once daily WITH BREAKFAST 90 tablet 3  . diclofenac sodium (VOLTAREN) 1 % GEL Apply 2 g topically 4 (four) times daily. 1 Tube 0  . finasteride (PROSCAR) 5 MG tablet take 1 tablet by mouth once daily    . fluticasone (FLONASE) 50 MCG/ACT nasal spray Place 1 spray into both nostrils daily as needed for allergies.     . furosemide (LASIX) 20 MG tablet Take 20 mg (1 tab) once weekly AS NEEDED for weight 167 lbs or more. If needed more than once weekly, call CHF clinic [phone]. 15 tablet 3  . gabapentin (NEURONTIN) 300 MG capsule Take 300 mg by mouth 2 (two) times daily.  0  . meclizine (ANTIVERT) 25 MG tablet Take 25 mg by mouth 2 (two) times daily as needed for dizziness.     . meloxicam (MOBIC) 15 MG tablet Take 15 mg by mouth daily as needed for pain.  0  . mometasone (ELOCON) 0.1 % cream Apply 1 application topically daily as needed (for irritation).     . Multiple Vitamin (MULTIVITAMIN PO) Take 1 tablet by mouth daily. ALIVE men's vitamin    . nitroGLYCERIN (NITROSTAT) 0.4 MG SL tablet place 1 tablet under the tongue if needed every 5 minutes for chest pain for 3 doses 25 tablet 11  . pantoprazole (PROTONIX) 40 MG tablet Take 40 mg by mouth daily.  0  . polyethylene glycol (MIRALAX / GLYCOLAX) packet Take 17 g by mouth daily.    . sacubitril-valsartan (ENTRESTO) 24-26 MG Take 1 tablet 2 (two) times daily by mouth. 60 tablet 5  . spironolactone (ALDACTONE) 25 MG tablet Take 0.5 tablets (12.5 mg total) by mouth daily. 45 tablet 3  . tiZANidine (ZANAFLEX) 2 MG tablet Take 2 mg by mouth every 8 (eight) hours as needed. For muscle spasms  0  . triamcinolone cream (KENALOG) 0.1 % Apply 1 application topically 2 (two) times daily as needed (skin).   0   No current facility-administered medications for this encounter.    BP (!) 108/48   Pulse (!) 54   Wt 175 lb 4 oz (79.5 kg)   SpO2 100%   BMI 25.15 kg/m   Wt Readings  from Last 3 Encounters:  12/27/17 175 lb 4 oz (79.5 kg)  08/30/17 173 lb 1.9 oz (78.5 kg)  06/18/17 171 lb (77.6 kg)    General: NAD Neck: No JVD, no thyromegaly or thyroid nodule.  Lungs: Clear to auscultation bilaterally with normal respiratory effort. CV: Nondisplaced PMI.  Heart regular S1/S2, no S3/S4, no murmur.  No peripheral edema.  No carotid bruit.  Normal pedal pulses.  Abdomen: Soft, nontender, no hepatosplenomegaly, no distention.  Skin: Intact  without lesions or rashes.  Neurologic: Alert and oriented x 3.  Psych: Normal affect. Extremities: No clubbing or cyanosis.  HEENT: Normal.   Assessment/Plan: 1. CAD: NSTEMI with DES to RCA (culprit vessel) and subsequent DES to CTO of LAD (cMRI showed viability in the LAD distribution) in 10/16. No chest pain.   - Continue ASA 81 and Plavix 75.  - atorvastatin 40 daily, good lipids 11/18.   2. Chronic systolic/diastolic heart failure: Ischemic cardiomyopathy. Echo 1/19 with EF 45-50%, improved with CRT.  St Jude CRT-D. Volume status stable by exam and Corevue.  NYHA class II.   - Continue lasix 20 mg po as needed for weight over 167 lbs at home.  Takes rarely.  - Continue coreg 6.25 mg BID - Continue spiro 12.5 mg daily - Continue Entresto 24/26 mg BID.  - Continue Cardiac Rehab maintenance program.  - BMET today.  3. Carotid stenosis:  - Has follow up at [facility] later this month.  Followup in 4 months.   Loralie Champagne, MD  12/28/2017

## 2017-12-30 LAB — CUP PACEART REMOTE DEVICE CHECK
Battery Remaining Percentage: 95.5 %
Battery Voltage: 2.99 V
Brady Statistic AP VP Percent: 5.4 %
Brady Statistic AS VP Percent: 95 %
Implantable Lead Implant Date: 20170406
Implantable Lead Implant Date: 20170406
Implantable Lead Location: 753859
Implantable Pulse Generator Implant Date: 20170406
Lead Channel Impedance Value: 550 Ohm
Lead Channel Pacing Threshold Amplitude: 0.5 V
Lead Channel Sensing Intrinsic Amplitude: 2.4 mV
Lead Channel Setting Pacing Amplitude: 2 V
Lead Channel Setting Pacing Pulse Width: 0.4 ms
MDC IDC LEAD IMPLANT DT: 20170406
MDC IDC LEAD LOCATION: 753858
MDC IDC LEAD LOCATION: 753860
MDC IDC MSMT BATTERY REMAINING LONGEVITY: 125 mo
MDC IDC MSMT LEADCHNL LV IMPEDANCE VALUE: 930 Ohm
MDC IDC MSMT LEADCHNL LV PACING THRESHOLD AMPLITUDE: 1 V
MDC IDC MSMT LEADCHNL LV PACING THRESHOLD PULSEWIDTH: 0.4 ms
MDC IDC MSMT LEADCHNL RA IMPEDANCE VALUE: 480 Ohm
MDC IDC MSMT LEADCHNL RA PACING THRESHOLD PULSEWIDTH: 0.4 ms
MDC IDC MSMT LEADCHNL RV PACING THRESHOLD AMPLITUDE: 0.625 V
MDC IDC MSMT LEADCHNL RV PACING THRESHOLD PULSEWIDTH: 0.4 ms
MDC IDC MSMT LEADCHNL RV SENSING INTR AMPL: 12 mV
MDC IDC SESS DTM: 20190206143017
MDC IDC SET LEADCHNL LV PACING AMPLITUDE: 2 V
MDC IDC SET LEADCHNL RA PACING AMPLITUDE: 1.5 V
MDC IDC SET LEADCHNL RV PACING PULSEWIDTH: 0.4 ms
MDC IDC SET LEADCHNL RV SENSING SENSITIVITY: 2 mV
MDC IDC STAT BRADY AP VS PERCENT: 1 %
MDC IDC STAT BRADY AS VS PERCENT: 1 %
MDC IDC STAT BRADY RA PERCENT PACED: 5.2 %
Pulse Gen Model: 3262
Pulse Gen Serial Number: 7866838

## 2017-12-31 ENCOUNTER — Encounter (HOSPITAL_COMMUNITY): Payer: Self-pay

## 2018-01-01 DIAGNOSIS — H16102 Unspecified superficial keratitis, left eye: Secondary | ICD-10-CM | POA: Diagnosis not present

## 2018-01-01 DIAGNOSIS — H02055 Trichiasis without entropian left lower eyelid: Secondary | ICD-10-CM | POA: Diagnosis not present

## 2018-01-01 DIAGNOSIS — H1132 Conjunctival hemorrhage, left eye: Secondary | ICD-10-CM | POA: Diagnosis not present

## 2018-01-01 DIAGNOSIS — H5712 Ocular pain, left eye: Secondary | ICD-10-CM | POA: Diagnosis not present

## 2018-01-02 ENCOUNTER — Encounter (HOSPITAL_COMMUNITY): Payer: Self-pay

## 2018-01-02 ENCOUNTER — Ambulatory Visit: Payer: Self-pay | Admitting: Vascular Surgery

## 2018-01-02 ENCOUNTER — Other Ambulatory Visit (HOSPITAL_COMMUNITY): Payer: Self-pay

## 2018-01-03 ENCOUNTER — Ambulatory Visit: Payer: Self-pay | Admitting: Podiatry

## 2018-01-03 ENCOUNTER — Ambulatory Visit: Payer: Medicare Other | Admitting: Podiatry

## 2018-01-03 ENCOUNTER — Encounter (HOSPITAL_COMMUNITY): Payer: Self-pay

## 2018-01-07 ENCOUNTER — Encounter (HOSPITAL_COMMUNITY): Payer: Self-pay

## 2018-01-09 ENCOUNTER — Ambulatory Visit (INDEPENDENT_AMBULATORY_CARE_PROVIDER_SITE_OTHER)
Admission: RE | Admit: 2018-01-09 | Discharge: 2018-01-09 | Disposition: A | Discharge status: Home / Self Care | Payer: Medicare Other | Source: Ambulatory Visit | Attending: Family | Admitting: Family

## 2018-01-09 ENCOUNTER — Encounter: Payer: Self-pay | Admitting: Vascular Surgery

## 2018-01-09 ENCOUNTER — Ambulatory Visit (INDEPENDENT_AMBULATORY_CARE_PROVIDER_SITE_OTHER): Payer: Medicare Other | Admitting: Vascular Surgery

## 2018-01-09 ENCOUNTER — Other Ambulatory Visit: Payer: Self-pay

## 2018-01-09 ENCOUNTER — Encounter (HOSPITAL_COMMUNITY): Payer: Self-pay

## 2018-01-09 ENCOUNTER — Ambulatory Visit (HOSPITAL_COMMUNITY)
Admission: RE | Admit: 2018-01-09 | Discharge: 2018-01-09 | Disposition: A | Discharge status: Home / Self Care | Payer: Medicare Other | Source: Ambulatory Visit | Attending: Family | Admitting: Family

## 2018-01-09 VITALS — BP 123/60 | HR 59 | Resp 20 | Ht 70.0 in | Wt 171.0 lb

## 2018-01-09 DIAGNOSIS — R0989 Other specified symptoms and signs involving the circulatory and respiratory systems: Secondary | ICD-10-CM | POA: Diagnosis not present

## 2018-01-09 DIAGNOSIS — I6523 Occlusion and stenosis of bilateral carotid arteries: Secondary | ICD-10-CM

## 2018-01-09 DIAGNOSIS — Z95828 Presence of other vascular implants and grafts: Secondary | ICD-10-CM | POA: Diagnosis not present

## 2018-01-09 DIAGNOSIS — Z9889 Other specified postprocedural states: Secondary | ICD-10-CM | POA: Diagnosis not present

## 2018-01-09 DIAGNOSIS — I714 Abdominal aortic aneurysm, without rupture, unspecified: Secondary | ICD-10-CM

## 2018-01-09 DIAGNOSIS — I6522 Occlusion and stenosis of left carotid artery: Secondary | ICD-10-CM | POA: Diagnosis not present

## 2018-01-09 DIAGNOSIS — I6521 Occlusion and stenosis of right carotid artery: Secondary | ICD-10-CM

## 2018-01-09 NOTE — Progress Notes (Signed)
Patient is an 84 year old male who returns for follow-up today.  He previously underwent right carotid endarterectomy 2015.  He had endovascular aneurysm repair of an abdominal aortic aneurysm in 2010.  He has done well from both of these.  He is currently on aspirin and Plavix.  He is also on Lipitor.  He denies any episodes of TIA amaurosis or stroke.  He has no abdominal or back pain.  He is still working full-time as a Furniture conservator/restorer.  He goes to cardiac rehab once a week.  Review of systems: He denies shortness of breath.  He denies chest pain.  Current Outpatient Medications on File Prior to Visit  Medication Sig Dispense Refill  . acetaminophen (TYLENOL) 500 MG tablet Take 1,000 mg by mouth daily as needed for mild pain or headache. Reported on 01/25/2016    . aspirin EC 81 MG tablet Take 81 mg by mouth daily.      Marland Kitchen atorvastatin (LIPITOR) 40 MG tablet Take 1 tablet (40 mg total) by mouth daily at 6 PM. 30 tablet 6  . Calcium Carbonate-Vitamin D (CALCIUM 600 + D PO) Take 1 tablet by mouth 2 (two) times daily.     . carvedilol (COREG) 6.25 MG tablet take 1 tablet by mouth twice a day 180 tablet 3  . clopidogrel (PLAVIX) 75 MG tablet take 1 tablet by mouth once daily WITH BREAKFAST 90 tablet 3  . diclofenac sodium (VOLTAREN) 1 % GEL Apply 2 g topically 4 (four) times daily. 1 Tube 0  . finasteride (PROSCAR) 5 MG tablet take 1 tablet by mouth once daily    . fluticasone (FLONASE) 50 MCG/ACT nasal spray Place 1 spray into both nostrils daily as needed for allergies.     . furosemide (LASIX) 20 MG tablet Take 20 mg (1 tab) once weekly AS NEEDED for weight 167 lbs or more. If needed more than once weekly, call CHF clinic [phone]. 15 tablet 3  . gabapentin (NEURONTIN) 300 MG capsule Take 300 mg by mouth 2 (two) times daily.  0  . meclizine (ANTIVERT) 25 MG tablet Take 25 mg by mouth 2 (two) times daily as needed for dizziness.     . meloxicam (MOBIC) 15 MG tablet Take 15 mg by mouth daily as needed  for pain.  0  . mometasone (ELOCON) 0.1 % cream Apply 1 application topically daily as needed (for irritation).     . Multiple Vitamin (MULTIVITAMIN PO) Take 1 tablet by mouth daily. ALIVE men's vitamin    . nitroGLYCERIN (NITROSTAT) 0.4 MG SL tablet place 1 tablet under the tongue if needed every 5 minutes for chest pain for 3 doses 25 tablet 11  . pantoprazole (PROTONIX) 40 MG tablet Take 40 mg by mouth daily.  0  . polyethylene glycol (MIRALAX / GLYCOLAX) packet Take 17 g by mouth daily.    . sacubitril-valsartan (ENTRESTO) 24-26 MG Take 1 tablet 2 (two) times daily by mouth. 60 tablet 5  . spironolactone (ALDACTONE) 25 MG tablet Take 0.5 tablets (12.5 mg total) by mouth daily. 45 tablet 3  . tiZANidine (ZANAFLEX) 2 MG tablet Take 2 mg by mouth every 8 (eight) hours as needed. For muscle spasms  0  . triamcinolone cream (KENALOG) 0.1 % Apply 1 application topically 2 (two) times daily as needed (skin).   0   No current facility-administered medications on file prior to visit.     Physical exam:  Vitals:   01/09/18 0953 01/09/18 0954  BP: (!) 112/52 123/60  Pulse: (!) 59   Resp: 20   SpO2: 98%   Weight: 171 lb (77.6 kg)   Height: 5\' 10"  (1.778 m)     Neck: No carotid bruits  Chest: Clear to auscultation  Cardiac: Regular rate and rhythm  Extremities: No palpable pedal pulses but 2+ femoral pulses  Data: Patient had a carotid duplex exam today which showed no recurrent stenosis bilaterally.  His abdominal aortic aneurysm measured 3.5 cm which is consistent with 1 year ago.  Aneurysm is well excluded.  He also had an ultrasound today to exclude popliteal aneurysms and this was negative.  Assessment: Doing well status post right carotid endarterectomy and endovascular aneurysm repair.  Follow-up with our nurse practitioner with repeat carotid duplex and stent graft ultrasound in 1 year.  Ruta Hinds, MD [facility] Office:  [phone] Pager: [phone]

## 2018-01-09 NOTE — Progress Notes (Signed)
Thanks, MCr 

## 2018-01-10 ENCOUNTER — Encounter (HOSPITAL_COMMUNITY): Payer: Self-pay

## 2018-01-14 ENCOUNTER — Encounter (HOSPITAL_COMMUNITY): Payer: Self-pay

## 2018-01-16 ENCOUNTER — Encounter (HOSPITAL_COMMUNITY): Payer: Self-pay

## 2018-01-17 ENCOUNTER — Encounter (HOSPITAL_COMMUNITY): Payer: Self-pay

## 2018-01-21 ENCOUNTER — Encounter (HOSPITAL_COMMUNITY)
Admission: RE | Admit: 2018-01-21 | Discharge: 2018-01-21 | Disposition: A | Discharge status: Home / Self Care | Payer: Self-pay | Source: Ambulatory Visit | Attending: Cardiology | Admitting: Cardiology

## 2018-01-21 DIAGNOSIS — I2510 Atherosclerotic heart disease of native coronary artery without angina pectoris: Secondary | ICD-10-CM | POA: Insufficient documentation

## 2018-01-23 ENCOUNTER — Encounter (HOSPITAL_COMMUNITY)
Admission: RE | Admit: 2018-01-23 | Discharge: 2018-01-23 | Disposition: A | Discharge status: Home / Self Care | Payer: Medicare Other | Source: Ambulatory Visit | Attending: Cardiology | Admitting: Cardiology

## 2018-01-24 ENCOUNTER — Encounter (HOSPITAL_COMMUNITY): Payer: Self-pay

## 2018-01-28 ENCOUNTER — Encounter (HOSPITAL_COMMUNITY)
Admission: RE | Admit: 2018-01-28 | Discharge: 2018-01-28 | Disposition: A | Discharge status: Home / Self Care | Payer: Medicare Other | Source: Ambulatory Visit | Attending: Cardiology | Admitting: Cardiology

## 2018-01-30 ENCOUNTER — Encounter (HOSPITAL_COMMUNITY)
Admission: RE | Admit: 2018-01-30 | Discharge: 2018-01-30 | Disposition: A | Discharge status: Home / Self Care | Payer: Self-pay | Source: Ambulatory Visit | Attending: Cardiology | Admitting: Cardiology

## 2018-01-31 ENCOUNTER — Encounter (HOSPITAL_COMMUNITY)
Admission: RE | Admit: 2018-01-31 | Discharge: 2018-01-31 | Disposition: A | Discharge status: Home / Self Care | Payer: Self-pay | Source: Ambulatory Visit | Attending: Cardiology | Admitting: Cardiology

## 2018-01-31 ENCOUNTER — Encounter: Payer: Self-pay | Admitting: Podiatry

## 2018-01-31 ENCOUNTER — Ambulatory Visit (INDEPENDENT_AMBULATORY_CARE_PROVIDER_SITE_OTHER): Payer: Medicare Other | Admitting: Podiatry

## 2018-01-31 DIAGNOSIS — B351 Tinea unguium: Secondary | ICD-10-CM

## 2018-01-31 DIAGNOSIS — I739 Peripheral vascular disease, unspecified: Secondary | ICD-10-CM

## 2018-01-31 DIAGNOSIS — M79676 Pain in unspecified toe(s): Secondary | ICD-10-CM

## 2018-01-31 NOTE — Progress Notes (Signed)
Patient ID: Henry Neal, male   DOB: Apr 20, 1927, 84 y.o.   MRN: [id number] Complaint:  Visit Type: Patient returns to my office for continued preventative foot care services. Complaint: Patient states" my nails have grown long and thick and become painful to walk and wear shoes"  He presents for preventative foot care services. No changes to ROS  Podiatric Exam: Vascular: dorsalis pedis and posterior tibial pulses are palpable bilateral. Capillary return is immediate. Temperature gradient is WNL. Skin turgor WNL  Sensorium: Normal Semmes Weinstein monofilament test. Normal tactile sensation bilaterally. Nail Exam: Pt has thick disfigured discolored nails with subungual debris noted bilateral entire nail hallux through fifth toenails Ulcer Exam: There is no evidence of ulcer or pre-ulcerative changes or infection. Orthopedic Exam: Muscle tone and strength are WNL. No limitations in general ROM. No crepitus or effusions noted. Foot type and digits show no abnormalities. Bony prominences are unremarkable. Skin: No Porokeratosis. No infection or ulcers. Distal Clavi second toe right.  Diagnosis:  Tinea unguium, Pain in right toe, pain in left toes, Second toe clavi right  Treatment & Plan Procedures and Treatment: Consent by patient was obtained for treatment procedures. The patient understood the discussion of treatment and procedures well. All questions were answered thoroughly reviewed. Debridement of mycotic and hypertrophic toenails, 1 through 5 bilateral and clearing of subungual debris. No ulceration, no infection noted. Clavi asymptomatic today. Return Visit-Office Procedure: Patient instructed to return to the office for a follow up visit 10  weeks  for continued evaluation and treatment.   Gardiner Barefoot DPM

## 2018-02-04 ENCOUNTER — Encounter (HOSPITAL_COMMUNITY): Payer: Self-pay

## 2018-02-06 ENCOUNTER — Encounter (HOSPITAL_COMMUNITY): Payer: Self-pay

## 2018-02-07 ENCOUNTER — Encounter (HOSPITAL_COMMUNITY): Payer: Self-pay

## 2018-02-11 ENCOUNTER — Encounter (HOSPITAL_COMMUNITY)
Admission: RE | Admit: 2018-02-11 | Discharge: 2018-02-11 | Disposition: A | Discharge status: Home / Self Care | Payer: Medicare Other | Source: Ambulatory Visit | Attending: Cardiology | Admitting: Cardiology

## 2018-02-13 ENCOUNTER — Encounter (HOSPITAL_COMMUNITY)
Admission: RE | Admit: 2018-02-13 | Discharge: 2018-02-13 | Disposition: A | Discharge status: Home / Self Care | Payer: Medicare Other | Source: Ambulatory Visit | Attending: Cardiology | Admitting: Cardiology

## 2018-02-14 ENCOUNTER — Other Ambulatory Visit (HOSPITAL_COMMUNITY): Payer: Self-pay | Admitting: Adult Health

## 2018-02-14 ENCOUNTER — Encounter (HOSPITAL_COMMUNITY): Payer: Self-pay

## 2018-02-18 ENCOUNTER — Encounter (HOSPITAL_COMMUNITY): Payer: Self-pay

## 2018-02-19 ENCOUNTER — Telehealth (HOSPITAL_COMMUNITY): Payer: Self-pay | Admitting: *Deleted

## 2018-02-20 ENCOUNTER — Ambulatory Visit (INDEPENDENT_AMBULATORY_CARE_PROVIDER_SITE_OTHER): Payer: Medicare Other | Admitting: Orthopaedic Surgery

## 2018-02-20 ENCOUNTER — Ambulatory Visit (INDEPENDENT_AMBULATORY_CARE_PROVIDER_SITE_OTHER): Payer: Medicare Other

## 2018-02-20 ENCOUNTER — Encounter (HOSPITAL_COMMUNITY): Payer: Self-pay

## 2018-02-20 DIAGNOSIS — M1711 Unilateral primary osteoarthritis, right knee: Secondary | ICD-10-CM

## 2018-02-20 DIAGNOSIS — I2510 Atherosclerotic heart disease of native coronary artery without angina pectoris: Secondary | ICD-10-CM | POA: Insufficient documentation

## 2018-02-20 MED ORDER — METHYLPREDNISOLONE ACETATE 40 MG/ML IJ SUSP
40.0000 mg | INTRAMUSCULAR | Status: AC | PRN
Start: 2018-02-20 — End: 2018-02-20
  Administered 2018-02-20: 40 mg via INTRA_ARTICULAR

## 2018-02-20 MED ORDER — LIDOCAINE HCL 1 % IJ SOLN
2.0000 mL | INTRAMUSCULAR | Status: AC | PRN
  Administered 2018-02-20: 2 mL

## 2018-02-20 MED ORDER — BUPIVACAINE HCL 0.5 % IJ SOLN
2.0000 mL | INTRAMUSCULAR | Status: AC | PRN
  Administered 2018-02-20: 2 mL via INTRA_ARTICULAR

## 2018-02-20 NOTE — Progress Notes (Signed)
Office Visit Note   Patient: Henry Neal           Date of Birth: February 18, 1927           MRN: [id number] Visit Date: 02/20/2018              Requested by: Merrilee Seashore, [address] [place], [place] [id number] PCP: Merrilee Seashore, MD   Assessment & Plan: Visit Diagnoses:  1. Primary osteoarthritis of right knee     Plan: Impression is 84 year old gentleman with right knee degenerative joint disease and effusion.  This was aspirated and then injected with cortisone.  Patient tolerates well.  Follow-up as needed.  Follow-Up Instructions: Return if symptoms worsen or fail to improve.   Orders:  Orders Placed This Encounter  Procedures  . XR Knee 1-2 Views Right   No orders of the defined types were placed in this encounter.     Procedures: Large Joint Inj: R knee on 02/20/2018 9:28 AM Indications: pain Details: 22 G needle  Arthrogram: No  Medications: 40 mg methylPREDNISolone acetate 40 MG/ML; 2 mL lidocaine 1 %; 2 mL bupivacaine 0.5 % Aspirate: 35 mL blood-tinged Outcome: tolerated well, no immediate complications Consent was given by the patient. Patient was prepped and draped in the usual sterile fashion.       Clinical Data: No additional findings.   Subjective: Chief Complaint  Patient presents with  . Right Knee - Pain    Patient is an 84 year old gentleman who comes in with right knee pain with recent worsening over the last 5 days.  He denies any injuries.  The pain with activity and with walking.  He has had previous knee aspirations before.  Denies any numbness and tingling.   Review of Systems  Constitutional: Negative.   All other systems reviewed and are negative.    Objective: Vital Signs: There were no vitals taken for this visit.  Physical Exam  Constitutional: He is oriented to person, place, and time. He appears well-developed and well-nourished.  Pulmonary/Chest: Effort normal.  Abdominal: Soft.    Neurological: He is alert and oriented to person, place, and time.  Skin: Skin is warm.  Psychiatric: He has a normal mood and affect. His behavior is normal. Judgment and thought content normal.  Nursing note and vitals reviewed.   Ortho Exam Right knee exam shows a moderate joint effusion.  Collaterals and cruciates are stable.  No evidence of infection.  Specialty Comments:  No specialty comments available.  Imaging: Xr Knee 1-2 Views Right  Result Date: 02/20/2018 Tricompartmental degenerative joint disease    PMFS History: Patient Active Problem List   Diagnosis Date Noted  . Back pain 05/27/2017  . Chronic systolic heart failure ([place]) 01/26/2016  . Chronic systolic CHF (congestive heart failure) ([place]) 08/24/2015  . Chest pain at rest   . Acute congestive heart failure ([place])   . NSTEMI (non-ST elevated myocardial infarction) ([place])   . Acute left-sided CHF (congestive heart failure) ([place]) - unclear if combined or diastolic 81/82/9937  . Acute respiratory failure with hypoxemia ([place]) 08/03/2015  . Elevated troponin I level - in setting of acute CHF 08/03/2015  . Renal insufficiency   . Hyperlipidemia 05/18/2014  . Melanoma in situ of face ([place]) 02/08/2014  . Coronary artery disease, occlusive 10/30/2013  . Chest pain 10/30/2013  . LBBB (left bundle branch block) 10/30/2013  . CAD in native artery 10/30/2013  . Block, bundle branch, left 10/30/2013  . Carotid  artery stenosis 10/27/2013  . Carotid artery obstruction 10/27/2013  . Carotid artery disease ([place]) 10/09/2013  . Essential hypertension 10/09/2013  . Abnormal stress test 10/09/2013  . Arterial disease ([place]) 10/09/2013  . Abnormal cardiovascular function study 10/09/2013  . Aftercare following surgery of the circulatory system, [place] 09/24/2013  . AAA (abdominal aortic aneurysm) without rupture ([place]) 09/24/2013  . Aneurysm ([place]) 09/24/2013  . Encounter for surgical follow-up care 09/24/2013  . Occlusion and  stenosis of carotid artery without mention of cerebral infarction 10/02/2012  . Abdominal aneurysm without mention of rupture 10/02/2012  . PAD (peripheral artery disease) ([place]) 10/02/2012  . Peripheral vascular disease ([place]) 10/02/2012  . Benign prostatic hypertrophy without urinary obstruction 08/19/2012  . Elevated prostate specific antigen (PSA) 08/19/2012  . Hydrocele 08/19/2012  . ED (erectile dysfunction) of organic origin 08/19/2012   Past Medical History:  Diagnosis Date  . AAA (abdominal aortic aneurysm) ([place])   . Abnormal stress test   . Allergic rhinitis    uses Flonase daily  . Anginal pain ([facility])    pain in neck,shoulders,down arms occ  . Arthritis   . Cancer ([place])    skin  . Carotid artery occlusion   . Chronic venous insufficiency   . Coronary artery disease   . Dizziness    takes Antivert daily as needed  . ED (erectile dysfunction)   . GERD (gastroesophageal reflux disease)    takes Omeprazole daily  . Hiatal hernia   . Hx of echocardiogram 04/04/2010   showed a borderline dilatd left ventricle with mild left ventricle hypertrophy and an EF 50-55% doppler suggested diastolic dysfunction, although the pattern is probably normal for an 84 yrs old. The left atrium was indeed mildly dilated at 42 mm, but there are no significant valvular abnormalities.   . Hyperlipidemia   . Hypertension    takes Proscar,Dyazide,Avapro,and Metoprolol daily  . Myocardial infarction [facility]) Oct. 11, 2016   Heart Attack  . Palpitation   . Shortness of breath   . Sigmoid diverticulosis   . T12 compression fracture ([place])   . Thyroid disease     Family History  Problem Relation Age of Onset  . Heart disease Mother        before age 22  . COPD Mother   . Diabetes Mother   . Hyperlipidemia Mother   . Hypertension Mother     Past Surgical History:  Procedure Laterality Date  . ABDOMINAL AORTIC ANEURYSM REPAIR  02/24/2009   AAA Stenting  . CARDIAC CATHETERIZATION N/A 08/04/2015    Procedure: Right/Left Heart Cath and Coronary Angiography;  Surgeon: Larey Dresser, MD;  Location: [facility];  Service: Cardiovascular;  Laterality: N/A;  . CARDIAC CATHETERIZATION N/A 08/04/2015   Procedure: Coronary Stent Intervention;  Surgeon: Burnell Blanks, MD;  Location: [facility];  Service: Cardiovascular;  Laterality: N/A;  . CARDIAC CATHETERIZATION N/A 08/08/2015   Procedure: Coronary Stent Intervention;  Surgeon: Peter M Martinique, MD;  Location: [facility];  Service: Cardiovascular;  Laterality: N/A;  . ENDARTERECTOMY Right 10/27/2013   Procedure: ENDARTERECTOMY CAROTID;  Surgeon: Elam Dutch, MD;  Location: [facility];  Service: Vascular;  Laterality: Right;  . EP IMPLANTABLE DEVICE N/A 01/26/2016   Procedure: BiV Pacemaker Insertion CRT-P;  Surgeon: Evans Lance, MD;  Location: [facility];  Service: Cardiovascular;  Laterality: N/A;  . HAND SURGERY Right    tendon, lft 90's  . HERNIA REPAIR Bilateral   .  LEFT HEART CATHETERIZATION WITH CORONARY ANGIOGRAM N/A 10/12/2013   Procedure: LEFT HEART CATHETERIZATION WITH CORONARY ANGIOGRAM;  Surgeon: Lorretta Harp, MD;  [facility];  Service: Cardiovascular;  Laterality: N/A;  . melanoma removed    . ROTATOR CUFF REPAIR  2003   left arm   Social History   Occupational History  . Not on file  Tobacco Use  . Smoking status: Former Smoker    Packs/day: 2.00    Years: 30.00    Pack years: 60.00    Types: Cigarettes    Last attempt to quit: 10/22/1977    Years since quitting: 40.3  . Smokeless tobacco: Former Systems developer    Quit date: 10/22/1984  Substance and Sexual Activity  . Alcohol use: Yes    Alcohol/week: 4.2 oz    Types: 7 Cans of beer per week  . Drug use: No  . Sexual activity: Not on file

## 2018-02-21 ENCOUNTER — Encounter (HOSPITAL_COMMUNITY): Payer: Self-pay

## 2018-02-25 ENCOUNTER — Encounter (HOSPITAL_COMMUNITY): Payer: Self-pay

## 2018-02-25 ENCOUNTER — Other Ambulatory Visit (HOSPITAL_COMMUNITY): Payer: Self-pay | Admitting: *Deleted

## 2018-02-25 MED ORDER — SACUBITRIL-VALSARTAN 24-26 MG PO TABS: 1.0000 | ORAL_TABLET | Freq: Two times a day (BID) | ORAL | 5 refills | Status: DC

## 2018-02-26 ENCOUNTER — Ambulatory Visit (INDEPENDENT_AMBULATORY_CARE_PROVIDER_SITE_OTHER): Payer: Medicare Other | Admitting: *Deleted

## 2018-02-26 DIAGNOSIS — I5022 Chronic systolic (congestive) heart failure: Secondary | ICD-10-CM

## 2018-02-26 NOTE — Progress Notes (Signed)
Remote pacemaker transmission.   

## 2018-02-27 ENCOUNTER — Encounter: Payer: Self-pay | Admitting: Cardiology

## 2018-02-27 ENCOUNTER — Encounter (HOSPITAL_COMMUNITY): Payer: Self-pay

## 2018-02-28 ENCOUNTER — Encounter (HOSPITAL_COMMUNITY): Payer: Self-pay

## 2018-03-04 ENCOUNTER — Encounter (HOSPITAL_COMMUNITY)
Admission: RE | Admit: 2018-03-04 | Discharge: 2018-03-04 | Disposition: A | Discharge status: Home / Self Care | Payer: Self-pay | Source: Ambulatory Visit | Attending: Cardiology | Admitting: Cardiology

## 2018-03-06 ENCOUNTER — Encounter (HOSPITAL_COMMUNITY): Payer: Self-pay

## 2018-03-07 ENCOUNTER — Encounter (HOSPITAL_COMMUNITY): Payer: Self-pay

## 2018-03-07 DIAGNOSIS — S93409A Sprain of unspecified ligament of unspecified ankle, initial encounter: Secondary | ICD-10-CM | POA: Diagnosis not present

## 2018-03-08 DIAGNOSIS — M79642 Pain in left hand: Secondary | ICD-10-CM | POA: Insufficient documentation

## 2018-03-11 ENCOUNTER — Encounter (HOSPITAL_COMMUNITY)
Admission: RE | Admit: 2018-03-11 | Discharge: 2018-03-11 | Disposition: A | Discharge status: Home / Self Care | Payer: Self-pay | Source: Ambulatory Visit | Attending: Cardiology | Admitting: Cardiology

## 2018-03-13 ENCOUNTER — Encounter (HOSPITAL_COMMUNITY): Payer: Self-pay

## 2018-03-14 ENCOUNTER — Encounter (HOSPITAL_COMMUNITY): Payer: Self-pay

## 2018-03-18 ENCOUNTER — Encounter (HOSPITAL_COMMUNITY): Payer: Self-pay

## 2018-03-18 LAB — CUP PACEART REMOTE DEVICE CHECK
Battery Remaining Longevity: 123 mo
Battery Remaining Percentage: 95.5 %
Battery Voltage: 2.98 V
Implantable Lead Implant Date: 20170406
Implantable Pulse Generator Implant Date: 20170406
Lead Channel Impedance Value: 480 Ohm
Lead Channel Pacing Threshold Amplitude: 0.625 V
Lead Channel Sensing Intrinsic Amplitude: 2.3 mV
Lead Channel Setting Pacing Amplitude: 1.625
Lead Channel Setting Pacing Amplitude: 2 V
Lead Channel Setting Pacing Pulse Width: 0.4 ms
Lead Channel Setting Pacing Pulse Width: 0.4 ms
MDC IDC LEAD IMPLANT DT: 20170406
MDC IDC LEAD IMPLANT DT: 20170406
MDC IDC LEAD LOCATION: 753858
MDC IDC LEAD LOCATION: 753859
MDC IDC LEAD LOCATION: 753860
MDC IDC MSMT LEADCHNL LV IMPEDANCE VALUE: 850 Ohm
MDC IDC MSMT LEADCHNL LV PACING THRESHOLD AMPLITUDE: 0.875 V
MDC IDC MSMT LEADCHNL LV PACING THRESHOLD PULSEWIDTH: 0.4 ms
MDC IDC MSMT LEADCHNL RA PACING THRESHOLD PULSEWIDTH: 0.4 ms
MDC IDC MSMT LEADCHNL RV IMPEDANCE VALUE: 550 Ohm
MDC IDC MSMT LEADCHNL RV PACING THRESHOLD AMPLITUDE: 0.5 V
MDC IDC MSMT LEADCHNL RV PACING THRESHOLD PULSEWIDTH: 0.4 ms
MDC IDC MSMT LEADCHNL RV SENSING INTR AMPL: 12 mV
MDC IDC SESS DTM: 20190508104812
MDC IDC SET LEADCHNL LV PACING AMPLITUDE: 2 V
MDC IDC SET LEADCHNL RV SENSING SENSITIVITY: 2 mV
MDC IDC STAT BRADY AP VP PERCENT: 5.9 %
MDC IDC STAT BRADY AP VS PERCENT: 1 %
MDC IDC STAT BRADY AS VP PERCENT: 94 %
MDC IDC STAT BRADY AS VS PERCENT: 1 %
MDC IDC STAT BRADY RA PERCENT PACED: 5.7 %
Pulse Gen Model: 3262
Pulse Gen Serial Number: 7866838

## 2018-03-20 ENCOUNTER — Encounter (HOSPITAL_COMMUNITY)
Admission: RE | Admit: 2018-03-20 | Discharge: 2018-03-20 | Disposition: A | Discharge status: Home / Self Care | Payer: Medicare Other | Source: Ambulatory Visit | Attending: Cardiology | Admitting: Cardiology

## 2018-03-21 ENCOUNTER — Encounter (HOSPITAL_COMMUNITY): Payer: Self-pay

## 2018-03-24 DIAGNOSIS — I2510 Atherosclerotic heart disease of native coronary artery without angina pectoris: Secondary | ICD-10-CM | POA: Diagnosis not present

## 2018-03-24 DIAGNOSIS — R7303 Prediabetes: Secondary | ICD-10-CM | POA: Diagnosis not present

## 2018-03-24 DIAGNOSIS — I779 Disorder of arteries and arterioles, unspecified: Secondary | ICD-10-CM | POA: Diagnosis not present

## 2018-03-24 DIAGNOSIS — E782 Mixed hyperlipidemia: Secondary | ICD-10-CM | POA: Diagnosis not present

## 2018-03-25 ENCOUNTER — Encounter (HOSPITAL_COMMUNITY)
Admission: RE | Admit: 2018-03-25 | Discharge: 2018-03-25 | Disposition: A | Discharge status: Home / Self Care | Payer: Self-pay | Source: Ambulatory Visit | Attending: Cardiology | Admitting: Cardiology

## 2018-03-25 DIAGNOSIS — I2510 Atherosclerotic heart disease of native coronary artery without angina pectoris: Secondary | ICD-10-CM | POA: Insufficient documentation

## 2018-03-27 ENCOUNTER — Encounter (HOSPITAL_COMMUNITY)
Admission: RE | Admit: 2018-03-27 | Discharge: 2018-03-27 | Disposition: A | Discharge status: Home / Self Care | Payer: Self-pay | Source: Ambulatory Visit | Attending: Cardiology | Admitting: Cardiology

## 2018-03-28 ENCOUNTER — Encounter (HOSPITAL_COMMUNITY): Payer: Self-pay

## 2018-03-28 DIAGNOSIS — L738 Other specified follicular disorders: Secondary | ICD-10-CM | POA: Diagnosis not present

## 2018-03-28 DIAGNOSIS — L929 Granulomatous disorder of the skin and subcutaneous tissue, unspecified: Secondary | ICD-10-CM | POA: Diagnosis not present

## 2018-03-28 DIAGNOSIS — L308 Other specified dermatitis: Secondary | ICD-10-CM | POA: Diagnosis not present

## 2018-03-28 DIAGNOSIS — D225 Melanocytic nevi of trunk: Secondary | ICD-10-CM | POA: Diagnosis not present

## 2018-03-28 DIAGNOSIS — Z85828 Personal history of other malignant neoplasm of skin: Secondary | ICD-10-CM | POA: Diagnosis not present

## 2018-03-28 DIAGNOSIS — D692 Other nonthrombocytopenic purpura: Secondary | ICD-10-CM | POA: Diagnosis not present

## 2018-03-28 DIAGNOSIS — L570 Actinic keratosis: Secondary | ICD-10-CM | POA: Diagnosis not present

## 2018-03-31 DIAGNOSIS — I2510 Atherosclerotic heart disease of native coronary artery without angina pectoris: Secondary | ICD-10-CM | POA: Diagnosis not present

## 2018-03-31 DIAGNOSIS — E782 Mixed hyperlipidemia: Secondary | ICD-10-CM | POA: Diagnosis not present

## 2018-03-31 DIAGNOSIS — I739 Peripheral vascular disease, unspecified: Secondary | ICD-10-CM | POA: Diagnosis not present

## 2018-03-31 DIAGNOSIS — R7303 Prediabetes: Secondary | ICD-10-CM | POA: Diagnosis not present

## 2018-04-01 ENCOUNTER — Encounter (HOSPITAL_COMMUNITY)
Admission: RE | Admit: 2018-04-01 | Discharge: 2018-04-01 | Disposition: A | Discharge status: Home / Self Care | Payer: Self-pay | Source: Ambulatory Visit | Attending: Cardiology | Admitting: Cardiology

## 2018-04-03 ENCOUNTER — Encounter (HOSPITAL_COMMUNITY)
Admission: RE | Admit: 2018-04-03 | Discharge: 2018-04-03 | Disposition: A | Discharge status: Home / Self Care | Payer: Medicare Other | Source: Ambulatory Visit | Attending: Cardiology | Admitting: Cardiology

## 2018-04-04 ENCOUNTER — Encounter: Payer: Self-pay | Admitting: Podiatry

## 2018-04-04 ENCOUNTER — Ambulatory Visit (INDEPENDENT_AMBULATORY_CARE_PROVIDER_SITE_OTHER): Payer: Medicare Other | Admitting: Podiatry

## 2018-04-04 ENCOUNTER — Telehealth (HOSPITAL_COMMUNITY): Payer: Self-pay

## 2018-04-04 ENCOUNTER — Encounter (HOSPITAL_COMMUNITY)
Admission: RE | Admit: 2018-04-04 | Discharge: 2018-04-04 | Disposition: A | Discharge status: Home / Self Care | Payer: Self-pay | Source: Ambulatory Visit | Attending: Cardiology | Admitting: Cardiology

## 2018-04-04 DIAGNOSIS — I739 Peripheral vascular disease, unspecified: Secondary | ICD-10-CM | POA: Diagnosis not present

## 2018-04-04 DIAGNOSIS — B351 Tinea unguium: Secondary | ICD-10-CM

## 2018-04-04 DIAGNOSIS — M79676 Pain in unspecified toe(s): Secondary | ICD-10-CM

## 2018-04-04 NOTE — Telephone Encounter (Signed)
Henry Neal, who is aware.

## 2018-04-04 NOTE — Progress Notes (Signed)
Patient ID: Henry Neal, male   DOB: 01/15/1927, 84 y.o.   MRN: [id number] Complaint:  Visit Type: Patient returns to my office for continued preventative foot care services. Complaint: Patient states" my nails have grown long and thick and become painful to walk and wear shoes"  He presents for preventative foot care services. No changes to ROS  Podiatric Exam: Vascular: dorsalis pedis and posterior tibial pulses are palpable bilateral. Capillary return is immediate. Temperature gradient is WNL. Skin turgor WNL  Sensorium: Normal Semmes Weinstein monofilament test. Normal tactile sensation bilaterally. Nail Exam: Pt has thick disfigured discolored nails with subungual debris noted bilateral entire nail hallux through fifth toenails Ulcer Exam: There is no evidence of ulcer or pre-ulcerative changes or infection. Orthopedic Exam: Muscle tone and strength are WNL. No limitations in general ROM. No crepitus or effusions noted. Foot type and digits show no abnormalities. Bony prominences are unremarkable. Skin: No Porokeratosis. No infection or ulcers.  Diagnosis:  Tinea unguium, Pain in right toe, pain in left toes,   Treatment & Plan Procedures and Treatment: Consent by patient was obtained for treatment procedures. The patient understood the discussion of treatment and procedures well. All questions were answered thoroughly reviewed. Debridement of mycotic and hypertrophic toenails, 1 through 5 bilateral and clearing of subungual debris. No ulceration, no infection noted.   Return Visit-Office Procedure: Patient instructed to return to the office for a follow up visit 10  weeks  for continued evaluation and treatment.   Aayansh Codispoti DPM 

## 2018-04-04 NOTE — Progress Notes (Signed)
Henry Neal reported that he took 20 mg of furosemide yesterday for a weight gain of 3 pounds in one day. Entry blood pressure 92/48. Heart rate 62. Patient reported feeling a little lightheaded on the track repeat blood pressure 91/42. Exercise stopped. Patient given Gatorade feet elevated. Repeat sitting blood pressure 85/35. Heart rate 54 then 77/39 heart rate 52. The heart failure clinic was called and notified. Patient was given another 1/2 cup of Gatorade. Repeat standing blood pressure 93/50. Patient now asymptomatic. Patient given a half a cup of water. Standing blood pressure now 105/55 heart rate 54. Henry Neal Therapist, sports at the heart failure notified and will call the patient if there are any future instructions. Patient left cardiac rehab without complaints or symptoms.Will continue to monitor the patient throughout  the program.Henry Venetia Maxon, RN,BSN 04/04/2018 9:34 AM

## 2018-04-04 NOTE — Telephone Encounter (Signed)
No changes since his vitals improved with fluids. Thanks.

## 2018-04-04 NOTE — Telephone Encounter (Signed)
Advanced Heart Failure Triage Encounter  Patient Name: Henry Neal  Date of Call: 04/04/18  Problem:  Henry Neal Cardiac-Rehab called for pt b/c his BP went from 92/48 at the beginning of class to 77/39. Pt recalls taking Lasix 20 mg yesterday due to gaining 3 lbs this week, No other med changes. Maria gave 1.5 cups of Gatorade.   Plan:    Shirley Muscat, RN

## 2018-04-08 ENCOUNTER — Encounter (HOSPITAL_COMMUNITY): Payer: Self-pay

## 2018-04-10 ENCOUNTER — Encounter (HOSPITAL_COMMUNITY)
Admission: RE | Admit: 2018-04-10 | Discharge: 2018-04-10 | Disposition: A | Discharge status: Home / Self Care | Payer: Self-pay | Source: Ambulatory Visit | Attending: Cardiology | Admitting: Cardiology

## 2018-04-11 ENCOUNTER — Encounter (HOSPITAL_COMMUNITY)
Admission: RE | Admit: 2018-04-11 | Discharge: 2018-04-11 | Disposition: A | Discharge status: Home / Self Care | Payer: Self-pay | Source: Ambulatory Visit | Attending: Cardiology | Admitting: Cardiology

## 2018-04-15 ENCOUNTER — Encounter (HOSPITAL_COMMUNITY)
Admission: RE | Admit: 2018-04-15 | Discharge: 2018-04-15 | Disposition: A | Discharge status: Home / Self Care | Payer: Medicare Other | Source: Ambulatory Visit | Attending: Cardiology | Admitting: Cardiology

## 2018-04-15 ENCOUNTER — Telehealth (HOSPITAL_COMMUNITY): Payer: Self-pay | Admitting: *Deleted

## 2018-04-15 NOTE — Telephone Encounter (Signed)
Patient enrolled in [employer] for Entresto.  Has $1,000 available from 02/27/18 through 02/27/19.

## 2018-04-17 ENCOUNTER — Encounter (HOSPITAL_COMMUNITY): Payer: Self-pay

## 2018-04-18 ENCOUNTER — Encounter (HOSPITAL_COMMUNITY): Payer: Self-pay

## 2018-04-22 ENCOUNTER — Encounter (HOSPITAL_COMMUNITY): Payer: Self-pay

## 2018-04-22 DIAGNOSIS — I2510 Atherosclerotic heart disease of native coronary artery without angina pectoris: Secondary | ICD-10-CM | POA: Insufficient documentation

## 2018-04-23 DIAGNOSIS — N430 Encysted hydrocele: Secondary | ICD-10-CM | POA: Diagnosis not present

## 2018-04-23 DIAGNOSIS — R109 Unspecified abdominal pain: Secondary | ICD-10-CM | POA: Diagnosis not present

## 2018-04-25 ENCOUNTER — Encounter (HOSPITAL_COMMUNITY): Payer: Self-pay

## 2018-04-29 ENCOUNTER — Encounter (HOSPITAL_COMMUNITY): Payer: Self-pay

## 2018-05-01 ENCOUNTER — Encounter (HOSPITAL_COMMUNITY)
Admission: RE | Admit: 2018-05-01 | Discharge: 2018-05-01 | Disposition: A | Discharge status: Home / Self Care | Payer: Medicare Other | Source: Ambulatory Visit | Attending: Cardiology | Admitting: Cardiology

## 2018-05-02 ENCOUNTER — Encounter (HOSPITAL_COMMUNITY)
Admission: RE | Admit: 2018-05-02 | Discharge: 2018-05-02 | Disposition: A | Discharge status: Home / Self Care | Payer: Self-pay | Source: Ambulatory Visit | Attending: Cardiology | Admitting: Cardiology

## 2018-05-06 ENCOUNTER — Encounter (HOSPITAL_COMMUNITY)
Admission: RE | Admit: 2018-05-06 | Discharge: 2018-05-06 | Disposition: A | Discharge status: Home / Self Care | Payer: Self-pay | Source: Ambulatory Visit | Attending: Cardiology | Admitting: Cardiology

## 2018-05-06 ENCOUNTER — Other Ambulatory Visit (HOSPITAL_COMMUNITY): Payer: Self-pay | Admitting: *Deleted

## 2018-05-06 MED ORDER — FUROSEMIDE 20 MG PO TABS: ORAL_TABLET | ORAL | 3 refills | Status: DC

## 2018-05-08 ENCOUNTER — Encounter (HOSPITAL_COMMUNITY): Payer: Self-pay

## 2018-05-09 ENCOUNTER — Encounter (HOSPITAL_COMMUNITY): Payer: Self-pay

## 2018-05-13 ENCOUNTER — Encounter (HOSPITAL_COMMUNITY): Payer: Self-pay

## 2018-05-15 ENCOUNTER — Encounter (HOSPITAL_COMMUNITY): Payer: Self-pay

## 2018-05-16 ENCOUNTER — Encounter (HOSPITAL_COMMUNITY): Payer: Self-pay

## 2018-05-20 ENCOUNTER — Encounter (HOSPITAL_COMMUNITY)
Admission: RE | Admit: 2018-05-20 | Discharge: 2018-05-20 | Disposition: A | Discharge status: Home / Self Care | Payer: Self-pay | Source: Ambulatory Visit | Attending: Cardiology | Admitting: Cardiology

## 2018-05-22 ENCOUNTER — Encounter (HOSPITAL_COMMUNITY): Payer: Self-pay | Attending: Cardiology

## 2018-05-22 DIAGNOSIS — I2510 Atherosclerotic heart disease of native coronary artery without angina pectoris: Secondary | ICD-10-CM | POA: Insufficient documentation

## 2018-05-23 ENCOUNTER — Encounter (HOSPITAL_COMMUNITY): Payer: Self-pay

## 2018-05-27 ENCOUNTER — Encounter (HOSPITAL_COMMUNITY): Payer: Self-pay

## 2018-05-28 ENCOUNTER — Ambulatory Visit (INDEPENDENT_AMBULATORY_CARE_PROVIDER_SITE_OTHER): Payer: Medicare Other | Admitting: *Deleted

## 2018-05-28 DIAGNOSIS — I5022 Chronic systolic (congestive) heart failure: Secondary | ICD-10-CM | POA: Diagnosis not present

## 2018-05-28 DIAGNOSIS — I441 Atrioventricular block, second degree: Secondary | ICD-10-CM

## 2018-05-29 ENCOUNTER — Encounter (HOSPITAL_COMMUNITY): Payer: Self-pay

## 2018-05-29 NOTE — Progress Notes (Signed)
Remote pacemaker transmission.   

## 2018-05-30 ENCOUNTER — Encounter (HOSPITAL_COMMUNITY): Payer: Self-pay

## 2018-05-31 ENCOUNTER — Other Ambulatory Visit: Payer: Self-pay | Admitting: Internal Medicine

## 2018-06-02 ENCOUNTER — Ambulatory Visit (HOSPITAL_COMMUNITY)
Admission: RE | Admit: 2018-06-02 | Discharge: 2018-06-02 | Disposition: A | Discharge status: Home / Self Care | Payer: Medicare Other | Source: Ambulatory Visit | Attending: Cardiology | Admitting: Cardiology

## 2018-06-02 VITALS — BP 98/50 | HR 57 | Wt 173.6 lb

## 2018-06-02 DIAGNOSIS — I2510 Atherosclerotic heart disease of native coronary artery without angina pectoris: Secondary | ICD-10-CM | POA: Diagnosis not present

## 2018-06-02 DIAGNOSIS — Z7982 Long term (current) use of aspirin: Secondary | ICD-10-CM | POA: Diagnosis not present

## 2018-06-02 DIAGNOSIS — I6529 Occlusion and stenosis of unspecified carotid artery: Secondary | ICD-10-CM | POA: Diagnosis not present

## 2018-06-02 DIAGNOSIS — I5022 Chronic systolic (congestive) heart failure: Secondary | ICD-10-CM

## 2018-06-02 DIAGNOSIS — I447 Left bundle-branch block, unspecified: Secondary | ICD-10-CM | POA: Insufficient documentation

## 2018-06-02 DIAGNOSIS — Z8249 Family history of ischemic heart disease and other diseases of the circulatory system: Secondary | ICD-10-CM | POA: Insufficient documentation

## 2018-06-02 DIAGNOSIS — I255 Ischemic cardiomyopathy: Secondary | ICD-10-CM | POA: Diagnosis not present

## 2018-06-02 DIAGNOSIS — I252 Old myocardial infarction: Secondary | ICD-10-CM | POA: Insufficient documentation

## 2018-06-02 DIAGNOSIS — Z7902 Long term (current) use of antithrombotics/antiplatelets: Secondary | ICD-10-CM | POA: Insufficient documentation

## 2018-06-02 DIAGNOSIS — I5042 Chronic combined systolic (congestive) and diastolic (congestive) heart failure: Secondary | ICD-10-CM | POA: Insufficient documentation

## 2018-06-02 DIAGNOSIS — Z87891 Personal history of nicotine dependence: Secondary | ICD-10-CM | POA: Diagnosis not present

## 2018-06-02 DIAGNOSIS — Z955 Presence of coronary angioplasty implant and graft: Secondary | ICD-10-CM | POA: Diagnosis not present

## 2018-06-02 DIAGNOSIS — N189 Chronic kidney disease, unspecified: Secondary | ICD-10-CM | POA: Insufficient documentation

## 2018-06-02 DIAGNOSIS — Z8546 Personal history of malignant neoplasm of prostate: Secondary | ICD-10-CM | POA: Insufficient documentation

## 2018-06-02 DIAGNOSIS — Z8679 Personal history of other diseases of the circulatory system: Secondary | ICD-10-CM | POA: Diagnosis not present

## 2018-06-02 DIAGNOSIS — Z79899 Other long term (current) drug therapy: Secondary | ICD-10-CM | POA: Insufficient documentation

## 2018-06-02 DIAGNOSIS — I7300 Raynaud's syndrome without gangrene: Secondary | ICD-10-CM | POA: Insufficient documentation

## 2018-06-02 DIAGNOSIS — I2582 Chronic total occlusion of coronary artery: Secondary | ICD-10-CM | POA: Diagnosis not present

## 2018-06-02 LAB — BASIC METABOLIC PANEL
ANION GAP: 7 (ref 5–15)
BUN: 13 mg/dL (ref 8–23)
CHLORIDE: 106 mmol/L (ref 98–111)
CO2: 25 mmol/L (ref 22–32)
Calcium: 8.7 mg/dL — ABNORMAL LOW (ref 8.9–10.3)
Creatinine, Ser: 0.99 mg/dL (ref 0.61–1.24)
GFR calc non Af Amer: >60 mL/min (ref >60)
Glucose, Bld: 102 mg/dL — ABNORMAL HIGH (ref 70–99)
POTASSIUM: 4.2 mmol/L (ref 3.5–5.1)
SODIUM: 138 mmol/L (ref 135–145)

## 2018-06-02 MED ORDER — CARVEDILOL 3.125 MG PO TABS: 3.1250 mg | ORAL_TABLET | Freq: Two times a day (BID) | ORAL | 11 refills | Status: DC

## 2018-06-02 MED ORDER — SACUBITRIL-VALSARTAN 24-26 MG PO TABS: 1.0000 | ORAL_TABLET | Freq: Two times a day (BID) | ORAL | 11 refills | Status: DC

## 2018-06-02 MED ORDER — SPIRONOLACTONE 25 MG PO TABS: 12.5000 mg | ORAL_TABLET | Freq: Every day | ORAL | 3 refills | Status: DC

## 2018-06-02 NOTE — Patient Instructions (Signed)
DECREASE Carvedilol (Coreg) to 3.125 mg twice daily. Can half current 6.25 mg tablets you have at home (Take 1/2 tablet twice daily). New Rx has been sent to your pharmacy for 3.125 mg tablets (Take 1 tab twice daily).  Take Lasix 20 mg once daily for two days, then back to once daily as needed.  Routine lab work today. Will notify you of abnormal results, otherwise no news is good news!  Follow up 3 months with Dr. Aundra Dubin.  _____________________________________________________________ Henry Neal Code: 1800  Take all medication as prescribed the day of your appointment. Bring all medications with you to your appointment.  Do the following things EVERYDAY: 1) Weigh yourself in the morning before breakfast. Write it down and keep it in a log. 2) Take your medicines as prescribed 3) Eat low salt foods-Limit salt (sodium) to 2000 mg per day.  4) Stay as active as you can everyday 5) Limit all fluids for the day to less than 2 liters

## 2018-06-03 ENCOUNTER — Encounter (HOSPITAL_COMMUNITY): Payer: Self-pay

## 2018-06-03 NOTE — Progress Notes (Signed)
Patient ID: Henry Neal, male   DOB: 1927-05-20, 84 y.o.   MRN: [id number]      Advanced Heart Failure Clinic Note  PCP: Dr. Ashby Dawes Cardiology: Dr. Thresa Ross is a 84 y.o. male with history of CAD, chronic systolic CHF, LBBB, AAA s/p repair presents for followup of CHF and CAD.  He had a cardiac cath in 12/15, showing total occlusion of the LAD and 50-60% proximal RCA.  EF at that time was near-normal.  In 10/16, he developed dyspnea and epigastric pain.  He was noted to be in acute pulmonary edema and was diuresed.  Troponin was only mildly elevated at 0.47.  He ultimately underwent LHC showing hazy 80-90% RCA stenosis which was likely the culprit for his presentation.  Additionally, the LAD was only subtotally occluded on this cath.  He had DES to RCA.  Cardiac MRI was done, showing EF 22% with significant viability in LAD territory.  Therefore, CTO of LAD was opened with DES.  Echo in 2/17 showed EF persistently low at 15%.  He had St Jude CRT-P placement in 4/17. Last echo in 1/19 showed EF up to 45-50% with mild MR.   Still works as a Furniture conservator/restorer.  [employer] has been up a few lbs at home recently.  He reports no significant exertional dyspnea.  No orthopnea/PND.  No chest pain.  Occasional lightheadedness with standing, BP is soft today and has been running lower in general (was sent home from cardiac rehab recently because his blood pressure was too low).    Labs (10/16): hgb 9.7, K 4.6, creatinine 1.12 Labs (11/16): K 5.9, creatinine 1.5 Labs (12/16); K 4.2, creatinine 1.07 => 1.02, BNP 448 Labs (1/17): K 4.1, creatinine 1.13, BNP 423, LDL 40, HDL 47 Labs (4/17): K 3.9, creatinine 1.03, HCT 32.6 Labs (5/17): K 4.4, creatinine 1.09 Labs (1/18): LDL 37 Labs (01/25/2017): K 3.5 Creatinine 0.96  Labs (8/18): BNP 30, K 3.7, creatinine 1.0 Labs (11/18): LDL 40, HDL 47, K 3.5, creatinine 0.96  Labs (3/19): K 4, creatinine 0.97  St Jude device interrogation: Impedance decreased  recently.  No AF.  > 99% BiV pacing.   PMH: 1. CKD 2. AAA s/p repair, followed at [facility]. 3. Carotid stenosis: s/p right CEA.   Carotid dopplers (2/16) with right CEA stable, < 28% LICA stenosis.  Followed at [facility].  - Carotid dopplers (3/15): 1-76% LICA, patent right CEA.  4. Chronic LBBB 5. LAD: LHC (12/15) with totally occluded LAD, 50-60% pRCA => medically managed.  10/16 admitted with NSTEMI.  LHC with subtotalled LAD occlusion, 80-90% pRCA with thrombus => DES RCA initially.  Cardiac MRI was done showing viability in the LAD territory, so he had DES to the CTO LAD.   6. Chronic systolic CHF: Ischemic cardiomyopathy.  Echo (10/16) with EF 15% with wall motion abnormalities, moderately dilated RV with normal systolic function.  CMRI (10/16) with severe LV dilation, EF 22% with septal-lateral dyssynchrony, no LV thrombus, normal RV size/systolic function => significant viability noted in the LAD territory.  Echo (2/17): EF 15%, severe LV dilation, wall motion abnormalities noted, mild MR, mildly dilated RV with normal systolic function, PASP 55 mmHg.  St Jude CRT-P 4/17.  - Echo (1/18): EF 50-55%, mild to moderate MR.  - Echo (1/19): EF 45-50%, mild LVH, inferior hypokinesis, mild MR, PASP 30 mmHg.  7. ABIs (10/16) were normal.  8. Raynaud's syndrome.  9. Sciatica  SH: Lives alone, prior smoker, still working as  a Furniture conservator/restorer.  FH: CAD  Review of systems complete and found to be negative unless listed in HPI.    Current Outpatient Medications  Medication Sig Dispense Refill  . acetaminophen (TYLENOL) 500 MG tablet Take 1,000 mg by mouth daily as needed for mild pain or headache. Reported on 01/25/2016    . aspirin EC 81 MG tablet Take 81 mg by mouth daily.      Marland Kitchen atorvastatin (LIPITOR) 40 MG tablet Take 1 tablet (40 mg total) by mouth daily at 6 PM. 30 tablet 6  . Calcium Carbonate-Vitamin D (CALCIUM 600 + D PO) Take 1 tablet by mouth 2 (two) times daily.     . carvedilol (COREG) 3.125 MG  tablet Take 1 tablet (3.125 mg total) by mouth 2 (two) times daily. 60 tablet 11  . finasteride (PROSCAR) 5 MG tablet take 1 tablet by mouth once daily    . fluticasone (FLONASE) 50 MCG/ACT nasal spray Place 1 spray into both nostrils daily as needed for allergies.     . furosemide (LASIX) 20 MG tablet TAKE 1 TABLET BY MOUTH ONCE WEEKLY AS NEEDED FOR WEIGHT 167LBS OR MORE; IF NEEDED MORE THAN ONCE WEEKLY. CALL CHF CLINIC [phone] 15 tablet 3  . gabapentin (NEURONTIN) 300 MG capsule Take 300 mg by mouth 2 (two) times daily.  0  . meclizine (ANTIVERT) 25 MG tablet Take 25 mg by mouth 2 (two) times daily as needed for dizziness.     . meloxicam (MOBIC) 15 MG tablet Take 15 mg by mouth daily as needed for pain.  0  . mometasone (ELOCON) 0.1 % cream Apply 1 application topically daily as needed (for irritation).     . Multiple Vitamin (MULTIVITAMIN PO) Take 1 tablet by mouth daily. ALIVE men's vitamin    . nitroGLYCERIN (NITROSTAT) 0.4 MG SL tablet place 1 tablet under the tongue if needed every 5 minutes for chest pain for 3 doses 25 tablet 11  . pantoprazole (PROTONIX) 40 MG tablet Take 40 mg by mouth daily.  0  . polyethylene glycol (MIRALAX / GLYCOLAX) packet Take 17 g by mouth daily.    . sacubitril-valsartan (ENTRESTO) 24-26 MG Take 1 tablet by mouth 2 (two) times daily. 60 tablet 11  . spironolactone (ALDACTONE) 25 MG tablet Take 0.5 tablets (12.5 mg total) by mouth at bedtime. 45 tablet 3  . tiZANidine (ZANAFLEX) 2 MG tablet Take 2 mg by mouth every 8 (eight) hours as needed. For muscle spasms  0  . triamcinolone cream (KENALOG) 0.1 % Apply 1 application topically 2 (two) times daily as needed (skin).   0  . clopidogrel (PLAVIX) 75 MG tablet TAKE 1 TABLET BY MOUTH ONCE DAILY WITH BREAKFAST 90 tablet 3  . diclofenac sodium (VOLTAREN) 1 % GEL Apply 2 g topically 4 (four) times daily. (Patient not taking: Reported on 04/04/2018) 1 Tube 0   No current facility-administered medications for this  encounter.    BP (!) 98/50   Pulse (!) 57   Wt 78.7 kg (173 lb 9.6 oz)   SpO2 95%   BMI 24.91 kg/m   Wt Readings from Last 3 Encounters:  06/02/18 78.7 kg (173 lb 9.6 oz)  01/09/18 77.6 kg (171 lb)  12/27/17 79.5 kg (175 lb 4 oz)    General: NAD Neck: No JVD, no thyromegaly or thyroid nodule.  Lungs: Clear to auscultation bilaterally with normal respiratory effort. CV: Nondisplaced PMI.  Heart regular S1/S2, no S3/S4, no murmur.  Trace ankle edema.  No carotid bruit.  Normal pedal pulses.  Abdomen: Soft, nontender, no hepatosplenomegaly, no distention.  Skin: Intact without lesions or rashes.  Neurologic: Alert and oriented x 3.  Psych: Normal affect. Extremities: No clubbing or cyanosis.  HEENT: Normal.   Assessment/Plan: 1. CAD: NSTEMI with DES to RCA (culprit vessel) and subsequent DES to CTO of LAD (cMRI showed viability in the LAD distribution) in 10/16. No chest pain.   - Continue ASA 81 and Plavix 75.  - atorvastatin 40 daily, good lipids 11/18.   2. Chronic systolic/diastolic heart failure: Ischemic cardiomyopathy. Echo 1/19 with EF 45-50%, improved with CRT.  St Jude CRT-D.  NYHA class II.  Volume status looks ok on exam but Corevue shows decreased thoracic impedance and weight at home is up a few lbs.    - Take Lasix 20 mg daily x 2 days then use prn again after that.  - Decrease Coreg to0 3.125 mg bid with soft BP and occasional orthostatic symptoms.  - Continue spiro 12.5 mg daily but take in the evening.  - Continue Entresto 24/26 mg BID.  - Continue Cardiac Rehab maintenance program.  - BMET today.  3. Carotid stenosis:  - Follows at [facility].  Followup in 3 months.   Loralie Champagne, MD  06/03/2018

## 2018-06-04 ENCOUNTER — Encounter: Payer: Self-pay | Admitting: Internal Medicine

## 2018-06-04 ENCOUNTER — Ambulatory Visit (INDEPENDENT_AMBULATORY_CARE_PROVIDER_SITE_OTHER): Payer: Medicare Other | Admitting: Internal Medicine

## 2018-06-04 VITALS — BP 118/46 | HR 56 | Ht 70.0 in | Wt 173.6 lb

## 2018-06-04 DIAGNOSIS — I441 Atrioventricular block, second degree: Secondary | ICD-10-CM

## 2018-06-04 DIAGNOSIS — Z950 Presence of cardiac pacemaker: Secondary | ICD-10-CM | POA: Diagnosis not present

## 2018-06-04 DIAGNOSIS — I5022 Chronic systolic (congestive) heart failure: Secondary | ICD-10-CM

## 2018-06-04 DIAGNOSIS — I447 Left bundle-branch block, unspecified: Secondary | ICD-10-CM

## 2018-06-04 LAB — CUP PACEART INCLINIC DEVICE CHECK
Brady Statistic RA Percent Paced: 6.9 %
Date Time Interrogation Session: 20190814124330
Implantable Lead Implant Date: 20170406
Implantable Lead Implant Date: 20170406
Implantable Lead Location: 753859
Implantable Pulse Generator Implant Date: 20170406
Lead Channel Impedance Value: 475 Ohm
Lead Channel Impedance Value: 537.5 Ohm
Lead Channel Impedance Value: 900 Ohm
Lead Channel Pacing Threshold Amplitude: 1 V
Lead Channel Pacing Threshold Amplitude: 1.125 V
Lead Channel Pacing Threshold Pulse Width: 0.4 ms
Lead Channel Pacing Threshold Pulse Width: 0.4 ms
Lead Channel Sensing Intrinsic Amplitude: 2.1 mV
Lead Channel Setting Pacing Amplitude: 2 V
Lead Channel Setting Pacing Amplitude: 2.125
Lead Channel Setting Pacing Pulse Width: 0.4 ms
MDC IDC LEAD IMPLANT DT: 20170406
MDC IDC LEAD LOCATION: 753858
MDC IDC LEAD LOCATION: 753860
MDC IDC MSMT BATTERY REMAINING LONGEVITY: 121 mo
MDC IDC MSMT BATTERY VOLTAGE: 2.98 V
MDC IDC MSMT LEADCHNL RA PACING THRESHOLD AMPLITUDE: 0.5 V
MDC IDC MSMT LEADCHNL RA PACING THRESHOLD PULSEWIDTH: 0.4 ms
MDC IDC MSMT LEADCHNL RV SENSING INTR AMPL: 12 mV
MDC IDC SET LEADCHNL RV PACING AMPLITUDE: 2 V
MDC IDC SET LEADCHNL RV PACING PULSEWIDTH: 0.4 ms
MDC IDC SET LEADCHNL RV SENSING SENSITIVITY: 2 mV
Pulse Gen Model: 3262
Pulse Gen Serial Number: 7866838

## 2018-06-04 NOTE — Progress Notes (Signed)
HPI [name] returns today for followup of his bi-V pacemaker and chronic systolic heart failure. He is a pleasant 84 yo man with a h/o coronary artery disease, severe left ventricular dysfunction, status post biventricular pacemaker secondary to left bundle branch block and an ejection fraction of 15%. He has class II heart failure symptoms. He notes a little dizziness. Minimal peripheral edema. He wears support stockings. He is still working as a Furniture conservator/restorer. Allergies  Allergen Reactions  . Doxepin Other (See Comments)    hallucinations  hallucinations Hallucinations  hallucinations Hallucinations  . Sinequan [Doxepin Hcl] Other (See Comments)    hallucinations  . Verapamil Other (See Comments)    Caused heart to race Caused heart to race  Caused heart to race  . Procaine Itching and Other (See Comments)    Tingling all over as soon as it was injected  Tingling all over as soon as it was injected     Current Outpatient Medications  Medication Sig Dispense Refill  . acetaminophen (TYLENOL) 500 MG tablet Take 1,000 mg by mouth daily as needed for mild pain or headache. Reported on 01/25/2016    . aspirin EC 81 MG tablet Take 81 mg by mouth daily.      Marland Kitchen atorvastatin (LIPITOR) 40 MG tablet Take 1 tablet (40 mg total) by mouth daily at 6 PM. 30 tablet 6  . Calcium Carbonate-Vitamin D (CALCIUM 600 + D PO) Take 1 tablet by mouth 2 (two) times daily.     . carvedilol (COREG) 3.125 MG tablet Take 1 tablet (3.125 mg total) by mouth 2 (two) times daily. 60 tablet 11  . clopidogrel (PLAVIX) 75 MG tablet TAKE 1 TABLET BY MOUTH ONCE DAILY WITH BREAKFAST 90 tablet 3  . finasteride (PROSCAR) 5 MG tablet take 1 tablet by mouth once daily    . fluticasone (FLONASE) 50 MCG/ACT nasal spray Place 1 spray into both nostrils daily as needed for allergies.     . furosemide (LASIX) 20 MG tablet TAKE 1 TABLET BY MOUTH ONCE WEEKLY AS NEEDED FOR WEIGHT 167LBS OR MORE; IF NEEDED MORE THAN ONCE  WEEKLY. CALL CHF CLINIC [phone] 15 tablet 3  . gabapentin (NEURONTIN) 300 MG capsule Take 300 mg by mouth 2 (two) times daily.  0  . meclizine (ANTIVERT) 25 MG tablet Take 25 mg by mouth 2 (two) times daily as needed for dizziness.     . meloxicam (MOBIC) 15 MG tablet Take 15 mg by mouth daily as needed for pain.  0  . mometasone (ELOCON) 0.1 % cream Apply 1 application topically daily as needed (for irritation).     . Multiple Vitamin (MULTIVITAMIN PO) Take 1 tablet by mouth daily. ALIVE men's vitamin    . nitroGLYCERIN (NITROSTAT) 0.4 MG SL tablet place 1 tablet under the tongue if needed every 5 minutes for chest pain for 3 doses 25 tablet 11  . pantoprazole (PROTONIX) 40 MG tablet Take 40 mg by mouth daily.  0  . polyethylene glycol (MIRALAX / GLYCOLAX) packet Take 17 g by mouth daily.    . sacubitril-valsartan (ENTRESTO) 24-26 MG Take 1 tablet by mouth 2 (two) times daily. 60 tablet 11  . spironolactone (ALDACTONE) 25 MG tablet Take 0.5 tablets (12.5 mg total) by mouth at bedtime. 45 tablet 3  . tiZANidine (ZANAFLEX) 2 MG tablet Take 2 mg by mouth every 8 (eight) hours as needed. For muscle spasms  0  . triamcinolone cream (KENALOG) 0.1 % Apply 1  application topically 2 (two) times daily as needed (skin).   0   No current facility-administered medications for this visit.      Past Medical History:  Diagnosis Date  . AAA (abdominal aortic aneurysm) ([place])   . Abnormal stress test   . Allergic rhinitis    uses Flonase daily  . Anginal pain ([facility])    pain in neck,shoulders,down arms occ  . Arthritis   . Cancer ([place])    skin  . Carotid artery occlusion   . Chronic venous insufficiency   . Coronary artery disease   . Dizziness    takes Antivert daily as needed  . ED (erectile dysfunction)   . GERD (gastroesophageal reflux disease)    takes Omeprazole daily  . Hiatal hernia   . Hx of echocardiogram 04/04/2010   showed a borderline dilatd left ventricle with mild left  ventricle hypertrophy and an EF 50-55% doppler suggested diastolic dysfunction, although the pattern is probably normal for an 84 yrs old. The left atrium was indeed mildly dilated at 42 mm, but there are no significant valvular abnormalities.   . Hyperlipidemia   . Hypertension    takes Proscar,Dyazide,Avapro,and Metoprolol daily  . Myocardial infarction [facility]) Oct. 11, 2016   Heart Attack  . Palpitation   . Shortness of breath   . Sigmoid diverticulosis   . T12 compression fracture ([place])   . Thyroid disease     ROS:   All systems reviewed and negative except as noted in the HPI.   Past Surgical History:  Procedure Laterality Date  . ABDOMINAL AORTIC ANEURYSM REPAIR  02/24/2009   AAA Stenting  . CARDIAC CATHETERIZATION N/A 08/04/2015   Procedure: Right/Left Heart Cath and Coronary Angiography;  Surgeon: Larey Dresser, MD;  Location: [place] [facility];  Service: Cardiovascular;  Laterality: N/A;  . CARDIAC CATHETERIZATION N/A 08/04/2015   Procedure: Coronary Stent Intervention;  Surgeon: Burnell Blanks, MD;  Location: [place] [facility];  Service: Cardiovascular;  Laterality: N/A;  . CARDIAC CATHETERIZATION N/A 08/08/2015   Procedure: Coronary Stent Intervention;  Surgeon: Peter M Martinique, MD;  Location: [place] [facility];  Service: Cardiovascular;  Laterality: N/A;  . ENDARTERECTOMY Right 10/27/2013   Procedure: ENDARTERECTOMY CAROTID;  Surgeon: Elam Dutch, MD;  Location: [place];  Service: Vascular;  Laterality: Right;  . EP IMPLANTABLE DEVICE N/A 01/26/2016   Procedure: BiV Pacemaker Insertion CRT-P;  Surgeon: Evans Lance, MD;  Location: [place] [facility];  Service: Cardiovascular;  Laterality: N/A;  . HAND SURGERY Right    tendon, lft 90's  . HERNIA REPAIR Bilateral   . LEFT HEART CATHETERIZATION WITH CORONARY ANGIOGRAM N/A 10/12/2013   Procedure: LEFT HEART CATHETERIZATION WITH CORONARY ANGIOGRAM;  Surgeon: Lorretta Harp, MD;  Location: [facility] CATH LAB;  Service:  Cardiovascular;  Laterality: N/A;  . melanoma removed    . ROTATOR CUFF REPAIR  2003   left arm     Family History  Problem Relation Age of Onset  . Heart disease Mother        before age 41  . COPD Mother   . Diabetes Mother   . Hyperlipidemia Mother   . Hypertension Mother      Social History   Socioeconomic History  . Marital status: Widowed    Spouse name: Not on file  . Number of children: Not on file  . Years of education: Not on file  . Highest education level: Not on file  Occupational History  .  Not on file  Social Needs  . Financial resource strain: Not on file  . Food insecurity:    Worry: Not on file    Inability: Not on file  . Transportation needs:    Medical: Not on file    Non-medical: Not on file  Tobacco Use  . Smoking status: Former Smoker    Packs/day: 2.00    Years: 30.00    Pack years: 60.00    Types: Cigarettes    Last attempt to quit: 10/22/1977    Years since quitting: 40.6  . Smokeless tobacco: Former Systems developer    Quit date: 10/22/1984  Substance and Sexual Activity  . Alcohol use: Yes    Alcohol/week: 7.0 standard drinks    Types: 7 Cans of beer per week  . Drug use: No  . Sexual activity: Not on file  Lifestyle  . Physical activity:    Days per week: Not on file    Minutes per session: Not on file  . Stress: Not on file  Relationships  . Social connections:    Talks on phone: Not on file    Gets together: Not on file    Attends religious service: Not on file    Active member of club or organization: Not on file    Attends meetings of clubs or organizations: Not on file    Relationship status: Not on file  . Intimate partner violence:    Fear of current or ex partner: Not on file    Emotionally abused: Not on file    Physically abused: Not on file    Forced sexual activity: Not on file  Other Topics Concern  . Not on file  Social History Narrative  . Not on file     BP (!) 118/46   Pulse (!) 56   Ht 5\' 10"  (1.778 m)    Wt 173 lb 9.6 oz (78.7 kg)   SpO2 96%   BMI 24.91 kg/m   Physical Exam:  Well appearing 84 yo man, NAD HEENT: Unremarkable Neck:  6 cm JVD, no thyromegally Lymphatics:  No adenopathy Back:  No CVA tenderness Lungs:  Clear HEART:  Regular rate rhythm, no murmurs, no rubs, no clicks Abd:  soft, positive bowel sounds, no organomegally, no rebound, no guarding Ext:  2 plus pulses, no edema, no cyanosis, no clubbing Skin:  No rashes no nodules Neuro:  CN II through XII intact, motor grossly intact  EKG - NSR with biv pacing  DEVICE  Normal device function.  See PaceArt for details.   Assess/Plan: 1. Chronic systolic heart failure - he appears euvolemic. He will continue his current meds. 2. PPM - his St. Jude biv PPM is working normally. Will recheck in several months. 3. CAD - he denies anginal symptoms and he remains active. 4. Carotid disease - he is stable and will followup with [facility].  Mikle Bosworth.D.

## 2018-06-04 NOTE — Patient Instructions (Signed)
Medication Instructions:  Your physician recommends that you continue on your current medications as directed. Please refer to the Current Medication list given to you today.  Labwork: None ordered.  Testing/Procedures: None ordered.  Follow-Up: Your physician wants you to follow-up in: one year with Dr. Lovena Le.   You will receive a reminder letter in the mail two months in advance. If you don't receive a letter, please call our office to schedule the follow-up appointment.  Remote monitoring is used to monitor your Pacemaker from home. This monitoring reduces the number of office visits required to check your device to one time per year. It allows [place] to keep an eye on the functioning of your device to ensure it is working properly. You are scheduled for a device check from home on 08/27/2018. You may send your transmission at any time that day. If you have a wireless device, the transmission will be sent automatically. After your physician reviews your transmission, you will receive a postcard with your next transmission date.  Any Other Special Instructions Will Be Listed Below (If Applicable).  If you need a refill on your cardiac medications before your next appointment, please call your pharmacy.

## 2018-06-05 ENCOUNTER — Encounter (HOSPITAL_COMMUNITY): Payer: Self-pay

## 2018-06-06 ENCOUNTER — Encounter (HOSPITAL_COMMUNITY): Payer: Self-pay

## 2018-06-10 ENCOUNTER — Encounter (HOSPITAL_COMMUNITY): Payer: Self-pay

## 2018-06-12 ENCOUNTER — Encounter (HOSPITAL_COMMUNITY): Payer: Self-pay

## 2018-06-13 ENCOUNTER — Encounter (HOSPITAL_COMMUNITY): Payer: Self-pay

## 2018-06-13 ENCOUNTER — Ambulatory Visit (INDEPENDENT_AMBULATORY_CARE_PROVIDER_SITE_OTHER): Payer: Medicare Other | Admitting: Podiatry

## 2018-06-13 ENCOUNTER — Encounter: Payer: Self-pay | Admitting: Podiatry

## 2018-06-13 DIAGNOSIS — B351 Tinea unguium: Secondary | ICD-10-CM

## 2018-06-13 DIAGNOSIS — I739 Peripheral vascular disease, unspecified: Secondary | ICD-10-CM

## 2018-06-13 DIAGNOSIS — M79676 Pain in unspecified toe(s): Secondary | ICD-10-CM

## 2018-06-13 NOTE — Progress Notes (Signed)
Patient ID: Henry Neal, male   DOB: 11/18/1926, 84 y.o.   MRN: [id number] Complaint:  Visit Type: Patient returns to my office for continued preventative foot care services. Complaint: Patient states" my nails have grown long and thick and become painful to walk and wear shoes"  He presents for preventative foot care services. No changes to ROS  Podiatric Exam: Vascular: dorsalis pedis and posterior tibial pulses are palpable bilateral. Capillary return is immediate. Temperature gradient is WNL. Skin turgor WNL  Sensorium: Normal Semmes Weinstein monofilament test. Normal tactile sensation bilaterally. Nail Exam: Pt has thick disfigured discolored nails with subungual debris noted bilateral entire nail hallux through fifth toenails Ulcer Exam: There is no evidence of ulcer or pre-ulcerative changes or infection. Orthopedic Exam: Muscle tone and strength are WNL. No limitations in general ROM. No crepitus or effusions noted. Foot type and digits show no abnormalities. Bony prominences are unremarkable. Skin: No Porokeratosis. No infection or ulcers.  Diagnosis:  Tinea unguium, Pain in right toe, pain in left toes,   Treatment & Plan Procedures and Treatment: Consent by patient was obtained for treatment procedures. The patient understood the discussion of treatment and procedures well. All questions were answered thoroughly reviewed. Debridement of mycotic and hypertrophic toenails, 1 through 5 bilateral and clearing of subungual debris. No ulceration, no infection noted.   Return Visit-Office Procedure: Patient instructed to return to the office for a follow up visit 10  weeks  for continued evaluation and treatment.   Gardiner Barefoot DPM

## 2018-06-17 ENCOUNTER — Encounter (HOSPITAL_COMMUNITY): Payer: Self-pay

## 2018-06-19 ENCOUNTER — Encounter (HOSPITAL_COMMUNITY): Payer: Self-pay

## 2018-06-20 ENCOUNTER — Encounter (HOSPITAL_COMMUNITY): Payer: Self-pay

## 2018-06-25 LAB — CUP PACEART REMOTE DEVICE CHECK
Battery Remaining Percentage: 95.5 %
Battery Voltage: 2.98 V
Brady Statistic AS VP Percent: 93 %
Implantable Lead Implant Date: 20170406
Implantable Lead Location: 753858
Implantable Lead Location: 753859
Implantable Pulse Generator Implant Date: 20170406
Lead Channel Impedance Value: 900 Ohm
Lead Channel Pacing Threshold Amplitude: 1.125 V
Lead Channel Pacing Threshold Amplitude: 1.375 V
Lead Channel Pacing Threshold Pulse Width: 0.4 ms
Lead Channel Pacing Threshold Pulse Width: 0.4 ms
Lead Channel Sensing Intrinsic Amplitude: 12 mV
Lead Channel Setting Pacing Amplitude: 2.125
Lead Channel Setting Sensing Sensitivity: 2 mV
MDC IDC LEAD IMPLANT DT: 20170406
MDC IDC LEAD IMPLANT DT: 20170406
MDC IDC LEAD LOCATION: 753860
MDC IDC MSMT BATTERY REMAINING LONGEVITY: 121 mo
MDC IDC MSMT LEADCHNL LV PACING THRESHOLD PULSEWIDTH: 0.4 ms
MDC IDC MSMT LEADCHNL RA IMPEDANCE VALUE: 460 Ohm
MDC IDC MSMT LEADCHNL RA SENSING INTR AMPL: 2.2 mV
MDC IDC MSMT LEADCHNL RV IMPEDANCE VALUE: 540 Ohm
MDC IDC MSMT LEADCHNL RV PACING THRESHOLD AMPLITUDE: 0.875 V
MDC IDC PG SERIAL: 7866838
MDC IDC SESS DTM: 20190808112856
MDC IDC SET LEADCHNL LV PACING PULSEWIDTH: 0.4 ms
MDC IDC SET LEADCHNL RA PACING AMPLITUDE: 2.375
MDC IDC SET LEADCHNL RV PACING AMPLITUDE: 2 V
MDC IDC SET LEADCHNL RV PACING PULSEWIDTH: 0.4 ms
MDC IDC STAT BRADY AP VP PERCENT: 7 %
MDC IDC STAT BRADY AP VS PERCENT: 1 %
MDC IDC STAT BRADY AS VS PERCENT: 1 %
MDC IDC STAT BRADY RA PERCENT PACED: 6.8 %

## 2018-06-27 ENCOUNTER — Telehealth (HOSPITAL_COMMUNITY): Payer: Self-pay | Admitting: Pharmacist

## 2018-06-27 NOTE — Telephone Encounter (Signed)
Patient was called by [employer] on 8/20, needed proof of income that patient said he would send in. They have not received it yet. They need this to complete his application. Called and reminded patient of this.   Ruta Hinds. Velva Harman, PharmD, BCPS, CPP Clinical Pharmacist Phone: [phone] 06/27/2018 11:12 AM

## 2018-07-11 DIAGNOSIS — H02052 Trichiasis without entropian right lower eyelid: Secondary | ICD-10-CM | POA: Diagnosis not present

## 2018-07-11 DIAGNOSIS — Z961 Presence of intraocular lens: Secondary | ICD-10-CM | POA: Diagnosis not present

## 2018-07-11 DIAGNOSIS — H0100A Unspecified blepharitis right eye, upper and lower eyelids: Secondary | ICD-10-CM | POA: Diagnosis not present

## 2018-07-11 DIAGNOSIS — H02055 Trichiasis without entropian left lower eyelid: Secondary | ICD-10-CM | POA: Diagnosis not present

## 2018-07-18 DIAGNOSIS — Z23 Encounter for immunization: Secondary | ICD-10-CM | POA: Diagnosis not present

## 2018-07-21 ENCOUNTER — Telehealth (HOSPITAL_COMMUNITY): Payer: Self-pay | Admitting: Pharmacist

## 2018-07-21 NOTE — Telephone Encounter (Signed)
[employer] patient assistance approved for [employer] 24-26 mg BID through 10/21/18.   Ruta Hinds. Velva Harman, PharmD, BCPS, CPP Clinical Pharmacist Phone: [phone] 07/21/2018 9:07 AM

## 2018-07-25 ENCOUNTER — Other Ambulatory Visit: Payer: Self-pay | Admitting: Cardiology

## 2018-07-29 ENCOUNTER — Encounter (HOSPITAL_COMMUNITY)
Admission: RE | Admit: 2018-07-29 | Discharge: 2018-07-29 | Disposition: A | Discharge status: Home / Self Care | Payer: Self-pay | Source: Ambulatory Visit | Attending: Cardiology | Admitting: Cardiology

## 2018-07-29 DIAGNOSIS — I2510 Atherosclerotic heart disease of native coronary artery without angina pectoris: Secondary | ICD-10-CM | POA: Insufficient documentation

## 2018-07-29 NOTE — Progress Notes (Signed)
[id number] Patient came to cardiac rehab for his 1st session back in the cardiac rehab maintenance program. Patient's blood pressure was 87/41 (seated, right arm), heart rate 59. When asked if patient was lightheaded or dizzy, patient stated "no more than usual". Pt given 12 oz of ice water for hydration. Pt states he ate an adequate breakfast consisting of oatmeal, egg beaters rice, 16 oz of water, and a few sips of ginger ale. Initial recheck BP after water was 84/50 (seated, rt arm), standing was 104/44. Final BP check standing, left arm was 114/49. Pt did not exercise today. Pt will continue to monitor BP at home and contact Dr. Claris Gladden office if low reading persist. Pt will return to exercise on Thursday. Sol Passer, MS, ACSM CEP

## 2018-07-31 ENCOUNTER — Encounter (HOSPITAL_COMMUNITY)
Admission: RE | Admit: 2018-07-31 | Discharge: 2018-07-31 | Disposition: A | Discharge status: Home / Self Care | Payer: Self-pay | Source: Ambulatory Visit | Attending: Cardiology | Admitting: Cardiology

## 2018-08-01 ENCOUNTER — Encounter (HOSPITAL_COMMUNITY)
Admission: RE | Admit: 2018-08-01 | Discharge: 2018-08-01 | Disposition: A | Discharge status: Home / Self Care | Payer: Self-pay | Source: Ambulatory Visit | Attending: Cardiology | Admitting: Cardiology

## 2018-08-05 ENCOUNTER — Encounter (HOSPITAL_COMMUNITY): Payer: Self-pay

## 2018-08-07 ENCOUNTER — Encounter (HOSPITAL_COMMUNITY)
Admission: RE | Admit: 2018-08-07 | Discharge: 2018-08-07 | Disposition: A | Discharge status: Home / Self Care | Payer: Self-pay | Source: Ambulatory Visit | Attending: Cardiology | Admitting: Cardiology

## 2018-08-08 ENCOUNTER — Encounter (HOSPITAL_COMMUNITY)
Admission: RE | Admit: 2018-08-08 | Discharge: 2018-08-08 | Disposition: A | Discharge status: Home / Self Care | Payer: Self-pay | Source: Ambulatory Visit | Attending: Cardiology | Admitting: Cardiology

## 2018-08-12 ENCOUNTER — Encounter (HOSPITAL_COMMUNITY)
Admission: RE | Admit: 2018-08-12 | Discharge: 2018-08-12 | Disposition: A | Discharge status: Home / Self Care | Payer: Self-pay | Source: Ambulatory Visit | Attending: Cardiology | Admitting: Cardiology

## 2018-08-14 ENCOUNTER — Encounter (HOSPITAL_COMMUNITY)
Admission: RE | Admit: 2018-08-14 | Discharge: 2018-08-14 | Disposition: A | Discharge status: Home / Self Care | Payer: Self-pay | Source: Ambulatory Visit | Attending: Cardiology | Admitting: Cardiology

## 2018-08-15 ENCOUNTER — Encounter (HOSPITAL_COMMUNITY): Payer: Self-pay

## 2018-08-19 ENCOUNTER — Encounter (HOSPITAL_COMMUNITY)
Admission: RE | Admit: 2018-08-19 | Discharge: 2018-08-19 | Disposition: A | Discharge status: Home / Self Care | Payer: Self-pay | Source: Ambulatory Visit | Attending: Cardiology | Admitting: Cardiology

## 2018-08-20 ENCOUNTER — Ambulatory Visit (INDEPENDENT_AMBULATORY_CARE_PROVIDER_SITE_OTHER): Payer: Medicare Other | Admitting: Podiatry

## 2018-08-20 ENCOUNTER — Encounter: Payer: Self-pay | Admitting: Podiatry

## 2018-08-20 DIAGNOSIS — D689 Coagulation defect, unspecified: Secondary | ICD-10-CM

## 2018-08-20 DIAGNOSIS — B351 Tinea unguium: Secondary | ICD-10-CM

## 2018-08-20 DIAGNOSIS — M79676 Pain in unspecified toe(s): Secondary | ICD-10-CM

## 2018-08-20 DIAGNOSIS — I739 Peripheral vascular disease, unspecified: Secondary | ICD-10-CM

## 2018-08-20 NOTE — Progress Notes (Signed)
Patient ID: Henry Neal, male   DOB: [birth year], 84 y.o.   MRN: [id number] Complaint:  Visit Type: Patient returns to my office for continued preventative foot care services. Complaint: Patient states" my nails have grown long and thick and become painful to walk and wear shoes"  He presents for preventative foot care services. No changes to ROS.  Patient is taking plavix.  Podiatric Exam: Vascular: dorsalis pedis and posterior tibial pulses are palpable bilateral. Capillary return is immediate. Temperature gradient is WNL. Skin turgor WNL  Sensorium: Normal Semmes Weinstein monofilament test. Normal tactile sensation bilaterally. Nail Exam: Pt has thick disfigured discolored nails with subungual debris noted bilateral entire nail hallux through fifth toenails Ulcer Exam: There is no evidence of ulcer or pre-ulcerative changes or infection. Orthopedic Exam: Muscle tone and strength are WNL. No limitations in general ROM. No crepitus or effusions noted. Foot type and digits show no abnormalities. Bony prominences are unremarkable. Skin: No Porokeratosis. No infection or ulcers.  Diagnosis:  Tinea unguium, Pain in right toe, pain in left toes,   Treatment & Plan Procedures and Treatment: Consent by patient was obtained for treatment procedures. The patient understood the discussion of treatment and procedures well. All questions were answered thoroughly reviewed. Debridement of mycotic and hypertrophic toenails, 1 through 5 bilateral and clearing of subungual debris. No ulceration, no infection noted.   Return Visit-Office Procedure: Patient instructed to return to the office for a follow up visit 10  weeks  for continued evaluation and treatment.   Gardiner Barefoot DPM

## 2018-08-21 ENCOUNTER — Encounter (HOSPITAL_COMMUNITY)
Admission: RE | Admit: 2018-08-21 | Discharge: 2018-08-21 | Disposition: A | Discharge status: Home / Self Care | Payer: Self-pay | Source: Ambulatory Visit | Attending: Cardiology | Admitting: Cardiology

## 2018-08-22 ENCOUNTER — Ambulatory Visit: Payer: Medicare Other | Admitting: Podiatry

## 2018-08-22 ENCOUNTER — Encounter (HOSPITAL_COMMUNITY)
Admission: RE | Admit: 2018-08-22 | Discharge: 2018-08-22 | Disposition: A | Discharge status: Home / Self Care | Payer: Self-pay | Source: Ambulatory Visit | Attending: Cardiology | Admitting: Cardiology

## 2018-08-22 DIAGNOSIS — I2510 Atherosclerotic heart disease of native coronary artery without angina pectoris: Secondary | ICD-10-CM | POA: Insufficient documentation

## 2018-08-26 ENCOUNTER — Encounter (HOSPITAL_COMMUNITY)
Admission: RE | Admit: 2018-08-26 | Discharge: 2018-08-26 | Disposition: A | Discharge status: Home / Self Care | Payer: Self-pay | Source: Ambulatory Visit | Attending: Cardiology | Admitting: Cardiology

## 2018-08-27 ENCOUNTER — Telehealth: Payer: Self-pay

## 2018-08-27 ENCOUNTER — Ambulatory Visit (INDEPENDENT_AMBULATORY_CARE_PROVIDER_SITE_OTHER): Payer: Medicare Other | Admitting: *Deleted

## 2018-08-27 DIAGNOSIS — I441 Atrioventricular block, second degree: Secondary | ICD-10-CM

## 2018-08-27 DIAGNOSIS — I5022 Chronic systolic (congestive) heart failure: Secondary | ICD-10-CM

## 2018-08-27 NOTE — Progress Notes (Signed)
Remote pacemaker transmission.   

## 2018-08-27 NOTE — Telephone Encounter (Signed)
I spoke with the patient about this.

## 2018-08-28 ENCOUNTER — Encounter (HOSPITAL_COMMUNITY)
Admission: RE | Admit: 2018-08-28 | Discharge: 2018-08-28 | Disposition: A | Discharge status: Home / Self Care | Payer: Self-pay | Source: Ambulatory Visit | Attending: Cardiology | Admitting: Cardiology

## 2018-08-29 ENCOUNTER — Encounter (HOSPITAL_COMMUNITY): Payer: Self-pay

## 2018-08-31 ENCOUNTER — Encounter: Payer: Self-pay | Admitting: Cardiology

## 2018-09-02 ENCOUNTER — Encounter (HOSPITAL_COMMUNITY)
Admission: RE | Admit: 2018-09-02 | Discharge: 2018-09-02 | Disposition: A | Discharge status: Home / Self Care | Payer: Self-pay | Source: Ambulatory Visit | Attending: Cardiology | Admitting: Cardiology

## 2018-09-04 ENCOUNTER — Encounter (HOSPITAL_COMMUNITY)
Admission: RE | Admit: 2018-09-04 | Discharge: 2018-09-04 | Disposition: A | Discharge status: Home / Self Care | Payer: Self-pay | Source: Ambulatory Visit | Attending: Cardiology | Admitting: Cardiology

## 2018-09-04 ENCOUNTER — Ambulatory Visit (HOSPITAL_COMMUNITY)
Admission: RE | Admit: 2018-09-04 | Discharge: 2018-09-04 | Disposition: A | Discharge status: Home / Self Care | Payer: Medicare Other | Source: Ambulatory Visit | Attending: Cardiology | Admitting: Cardiology

## 2018-09-04 VITALS — BP 138/56 | HR 54 | Wt 177.2 lb

## 2018-09-04 DIAGNOSIS — Z8249 Family history of ischemic heart disease and other diseases of the circulatory system: Secondary | ICD-10-CM | POA: Diagnosis not present

## 2018-09-04 DIAGNOSIS — Z950 Presence of cardiac pacemaker: Secondary | ICD-10-CM | POA: Insufficient documentation

## 2018-09-04 DIAGNOSIS — I2582 Chronic total occlusion of coronary artery: Secondary | ICD-10-CM | POA: Insufficient documentation

## 2018-09-04 DIAGNOSIS — Z87891 Personal history of nicotine dependence: Secondary | ICD-10-CM | POA: Insufficient documentation

## 2018-09-04 DIAGNOSIS — I5042 Chronic combined systolic (congestive) and diastolic (congestive) heart failure: Secondary | ICD-10-CM | POA: Diagnosis not present

## 2018-09-04 DIAGNOSIS — Z955 Presence of coronary angioplasty implant and graft: Secondary | ICD-10-CM | POA: Diagnosis not present

## 2018-09-04 DIAGNOSIS — Z79899 Other long term (current) drug therapy: Secondary | ICD-10-CM | POA: Diagnosis not present

## 2018-09-04 DIAGNOSIS — I7300 Raynaud's syndrome without gangrene: Secondary | ICD-10-CM | POA: Insufficient documentation

## 2018-09-04 DIAGNOSIS — I252 Old myocardial infarction: Secondary | ICD-10-CM | POA: Diagnosis not present

## 2018-09-04 DIAGNOSIS — Z7982 Long term (current) use of aspirin: Secondary | ICD-10-CM | POA: Diagnosis not present

## 2018-09-04 DIAGNOSIS — I5022 Chronic systolic (congestive) heart failure: Secondary | ICD-10-CM

## 2018-09-04 DIAGNOSIS — I2510 Atherosclerotic heart disease of native coronary artery without angina pectoris: Secondary | ICD-10-CM | POA: Diagnosis not present

## 2018-09-04 DIAGNOSIS — I255 Ischemic cardiomyopathy: Secondary | ICD-10-CM | POA: Diagnosis not present

## 2018-09-04 DIAGNOSIS — Z7902 Long term (current) use of antithrombotics/antiplatelets: Secondary | ICD-10-CM | POA: Insufficient documentation

## 2018-09-04 DIAGNOSIS — N189 Chronic kidney disease, unspecified: Secondary | ICD-10-CM | POA: Diagnosis not present

## 2018-09-04 LAB — BASIC METABOLIC PANEL
Anion gap: 8 (ref 5–15)
BUN: 15 mg/dL (ref 8–23)
CALCIUM: 8.7 mg/dL — AB (ref 8.9–10.3)
CO2: 25 mmol/L (ref 22–32)
CREATININE: 1.2 mg/dL (ref 0.61–1.24)
Chloride: 101 mmol/L (ref 98–111)
GFR calc non Af Amer: 52 mL/min — ABNORMAL LOW (ref >60)
Glucose, Bld: 109 mg/dL — ABNORMAL HIGH (ref 70–99)
Potassium: 3.9 mmol/L (ref 3.5–5.1)
SODIUM: 134 mmol/L — AB (ref 135–145)

## 2018-09-04 LAB — LIPID PANEL
CHOLESTEROL: 106 mg/dL (ref 0–200)
HDL: 45 mg/dL (ref >40)
LDL CALC: 44 mg/dL (ref 0–99)
Total CHOL/HDL Ratio: 2.4 RATIO
Triglycerides: 86 mg/dL (ref <150)
VLDL: 17 mg/dL (ref 0–40)

## 2018-09-04 NOTE — Progress Notes (Signed)
Patient ID: Henry Neal, male   DOB: 12/17/1926, 84 y.o.   MRN: [id number]      Advanced Heart Failure Clinic Note  PCP: Dr. Ashby Dawes Cardiology: Dr. Thresa Ross is a 84 y.o. male with history of CAD, chronic systolic CHF, LBBB, AAA s/p repair presents for followup of CHF and CAD.  He had a cardiac cath in 12/15, showing total occlusion of the LAD and 50-60% proximal RCA.  EF at that time was near-normal.  In 10/16, he developed dyspnea and epigastric pain.  He was noted to be in acute pulmonary edema and was diuresed.  Troponin was only mildly elevated at 0.47.  He ultimately underwent LHC showing hazy 80-90% RCA stenosis which was likely the culprit for his presentation.  Additionally, the LAD was only subtotally occluded on this cath.  He had DES to RCA.  Cardiac MRI was done, showing EF 22% with significant viability in LAD territory.  Therefore, CTO of LAD was opened with DES.  Echo in 2/17 showed EF persistently low at 15%.  He had St Jude CRT-P placement in 4/17. Last echo in 1/19 showed EF up to 45-50% with mild MR.   Still works as a Furniture conservator/restorer.  He takes Lasix occasionally but not regularly. No exertional dyspnea or chest pain. No orthopnea/PND.  We cut some of his meds back because of orthostasis, this has resolved. BP is stable.  No falls.  Weight up 3 lbs.   Labs (10/16): hgb 9.7, K 4.6, creatinine 1.12 Labs (11/16): K 5.9, creatinine 1.5 Labs (12/16); K 4.2, creatinine 1.07 => 1.02, BNP 448 Labs (1/17): K 4.1, creatinine 1.13, BNP 423, LDL 40, HDL 47 Labs (4/17): K 3.9, creatinine 1.03, HCT 32.6 Labs (5/17): K 4.4, creatinine 1.09 Labs (1/18): LDL 37 Labs (01/25/2017): K 3.5 Creatinine 0.96  Labs (8/18): BNP 30, K 3.7, creatinine 1.0 Labs (11/18): LDL 40, HDL 47, K 3.5, creatinine 0.96  Labs (3/19): K 4, creatinine 0.97  St Jude device interrogation: Impedance stable.  No AF.  > 99% BiV pacing, 4.7% a-pacing.   PMH: 1. CKD 2. AAA s/p repair, followed at [facility]. 3.  Carotid stenosis: s/p right CEA.   Carotid dopplers (2/16) with right CEA stable, < 38% LICA stenosis.  Followed at [facility].  - Carotid dopplers (1/01): 7-51% LICA, patent right CEA.  4. Chronic LBBB 5. LAD: LHC (12/15) with totally occluded LAD, 50-60% pRCA => medically managed.  10/16 admitted with NSTEMI.  LHC with subtotalled LAD occlusion, 80-90% pRCA with thrombus => DES RCA initially.  Cardiac MRI was done showing viability in the LAD territory, so he had DES to the CTO LAD.   6. Chronic systolic CHF: Ischemic cardiomyopathy.  Echo (10/16) with EF 15% with wall motion abnormalities, moderately dilated RV with normal systolic function.  CMRI (10/16) with severe LV dilation, EF 22% with septal-lateral dyssynchrony, no LV thrombus, normal RV size/systolic function => significant viability noted in the LAD territory.  Echo (2/17): EF 15%, severe LV dilation, wall motion abnormalities noted, mild MR, mildly dilated RV with normal systolic function, PASP 55 mmHg.  St Jude CRT-P 4/17.  - Echo (1/18): EF 50-55%, mild to moderate MR.  - Echo (1/19): EF 45-50%, mild LVH, inferior hypokinesis, mild MR, PASP 30 mmHg.  7. ABIs (10/16) were normal.  8. Raynaud's syndrome.  9. Sciatica  SH: Lives alone, prior smoker, still working as a Furniture conservator/restorer.  FH: CAD  Review of systems complete and found to be  negative unless listed in HPI.    Current Outpatient Medications  Medication Sig Dispense Refill  . acetaminophen (TYLENOL) 500 MG tablet Take 1,000 mg by mouth daily as needed for mild pain or headache. Reported on 01/25/2016    . aspirin EC 81 MG tablet Take 81 mg by mouth daily.      Marland Kitchen atorvastatin (LIPITOR) 40 MG tablet Take 1 tablet (40 mg total) by mouth daily at 6 PM. 30 tablet 6  . Calcium Carbonate-Vitamin D (CALCIUM 600 + D PO) Take 1 tablet by mouth 2 (two) times daily.     . carvedilol (COREG) 3.125 MG tablet Take 1 tablet (3.125 mg total) by mouth 2 (two) times daily. 60 tablet 11  . clopidogrel  (PLAVIX) 75 MG tablet TAKE 1 TABLET BY MOUTH ONCE DAILY WITH BREAKFAST 90 tablet 3  . clotrimazole-betamethasone (LOTRISONE) cream APPLY TO AA BID  1  . finasteride (PROSCAR) 5 MG tablet take 1 tablet by mouth once daily    . furosemide (LASIX) 20 MG tablet TAKE 1 TABLET BY MOUTH ONCE WEEKLY AS NEEDED FOR WEIGHT 167LBS OR MORE; IF NEEDED MORE THAN ONCE WEEKLY. CALL CHF CLINIC [phone] 15 tablet 3  . gabapentin (NEURONTIN) 300 MG capsule Take 300 mg by mouth 2 (two) times daily.  0  . mometasone (ELOCON) 0.1 % cream Apply 1 application topically daily as needed (for irritation).     . Multiple Vitamin (MULTIVITAMIN PO) Take 1 tablet by mouth daily. ALIVE men's vitamin    . pantoprazole (PROTONIX) 40 MG tablet Take 40 mg by mouth daily.  0  . polyethylene glycol (MIRALAX / GLYCOLAX) packet Take 17 g by mouth daily.    . sacubitril-valsartan (ENTRESTO) 24-26 MG Take 1 tablet by mouth 2 (two) times daily. 60 tablet 11  . spironolactone (ALDACTONE) 25 MG tablet Take 0.5 tablets (12.5 mg total) by mouth at bedtime. 45 tablet 3  . triamcinolone cream (KENALOG) 0.1 % Apply 1 application topically 2 (two) times daily as needed (skin).   0  . fluticasone (FLONASE) 50 MCG/ACT nasal spray Place 1 spray into both nostrils daily as needed for allergies.     Marland Kitchen meclizine (ANTIVERT) 25 MG tablet Take 25 mg by mouth 2 (two) times daily as needed for dizziness.     . meloxicam (MOBIC) 15 MG tablet Take 15 mg by mouth daily as needed for pain.  0  . nitroGLYCERIN (NITROSTAT) 0.4 MG SL tablet place 1 tablet under the tongue if needed every 5 minutes for chest pain for 3 doses (Patient not taking: Reported on 09/04/2018) 25 tablet 11  . tiZANidine (ZANAFLEX) 2 MG tablet Take 2 mg by mouth every 8 (eight) hours as needed. For muscle spasms  0   No current facility-administered medications for this encounter.    BP (!) 138/56   Pulse (!) 54   Wt 80.4 kg (177 lb 3.2 oz)   SpO2 95%   BMI 25.43 kg/m   Wt  Readings from Last 3 Encounters:  09/04/18 80.4 kg (177 lb 3.2 oz)  06/04/18 78.7 kg (173 lb 9.6 oz)  06/02/18 78.7 kg (173 lb 9.6 oz)    General: NAD Neck: No JVD, no thyromegaly or thyroid nodule.  Lungs: Clear to auscultation bilaterally with normal respiratory effort. CV: Nondisplaced PMI.  Heart regular S1/S2, no S3/S4, no murmur.  No peripheral edema.  No carotid bruit.  Normal pedal pulses.  Abdomen: Soft, nontender, no hepatosplenomegaly, no distention.  Skin: Intact without lesions  or rashes.  Neurologic: Alert and oriented x 3.  Psych: Normal affect. Extremities: No clubbing or cyanosis.  HEENT: Normal.   Assessment/Plan: 1. CAD: NSTEMI with DES to RCA (culprit vessel) and subsequent DES to CTO of LAD (cMRI showed viability in the LAD distribution) in 10/16. No chest pain.   - Continue ASA 81 and Plavix 75.  - atorvastatin 40 daily, check lipids today.    2. Chronic systolic/diastolic heart failure: Ischemic cardiomyopathy. Echo 1/19 with EF 45-50%, improved with CRT.  St Jude CRT-D.  NYHA class II.  Volume status looks ok on exam and Corevue.   No longer getting lightheaded.  - Continue to use Lasix prn.   - Continue Coreg 3.125 mg bid.  - Continue spiro 12.5 mg daily.  - Continue Entresto 24/26 mg BID.  - Continue Cardiac Rehab maintenance program.  - BMET today.  - Repeat echo at followup in 4 months.  3. Carotid stenosis:  - Follows at [facility].  Followup in 4 months with echo.   Loralie Champagne, MD  09/04/2018

## 2018-09-04 NOTE — Patient Instructions (Signed)
Routine lab work today. ?Will notify you of abnormal results ? ?Follow up and echo in 4 months ?

## 2018-09-05 ENCOUNTER — Encounter (HOSPITAL_COMMUNITY): Payer: Self-pay

## 2018-09-09 ENCOUNTER — Encounter (HOSPITAL_COMMUNITY)
Admission: RE | Admit: 2018-09-09 | Discharge: 2018-09-09 | Disposition: A | Discharge status: Home / Self Care | Payer: Self-pay | Source: Ambulatory Visit | Attending: Cardiology | Admitting: Cardiology

## 2018-09-11 ENCOUNTER — Encounter (HOSPITAL_COMMUNITY)
Admission: RE | Admit: 2018-09-11 | Discharge: 2018-09-11 | Disposition: A | Discharge status: Home / Self Care | Payer: Self-pay | Source: Ambulatory Visit | Attending: Cardiology | Admitting: Cardiology

## 2018-09-11 ENCOUNTER — Other Ambulatory Visit (HOSPITAL_COMMUNITY): Payer: Self-pay

## 2018-09-11 MED ORDER — MECLIZINE HCL 25 MG PO TABS: 25.0000 mg | ORAL_TABLET | Freq: Two times a day (BID) | ORAL | 0 refills | Status: AC | PRN

## 2018-09-12 ENCOUNTER — Encounter (HOSPITAL_COMMUNITY): Payer: Self-pay

## 2018-09-16 ENCOUNTER — Encounter (HOSPITAL_COMMUNITY)
Admission: RE | Admit: 2018-09-16 | Discharge: 2018-09-16 | Disposition: A | Discharge status: Home / Self Care | Payer: Self-pay | Source: Ambulatory Visit | Attending: Cardiology | Admitting: Cardiology

## 2018-09-19 ENCOUNTER — Encounter (HOSPITAL_COMMUNITY): Payer: Self-pay

## 2018-09-23 ENCOUNTER — Encounter (HOSPITAL_COMMUNITY)
Admission: RE | Admit: 2018-09-23 | Discharge: 2018-09-23 | Disposition: A | Discharge status: Home / Self Care | Payer: Self-pay | Source: Ambulatory Visit | Attending: Cardiology | Admitting: Cardiology

## 2018-09-23 DIAGNOSIS — I2510 Atherosclerotic heart disease of native coronary artery without angina pectoris: Secondary | ICD-10-CM | POA: Insufficient documentation

## 2018-09-25 ENCOUNTER — Encounter (HOSPITAL_COMMUNITY): Payer: Self-pay

## 2018-09-26 ENCOUNTER — Encounter (HOSPITAL_COMMUNITY): Payer: Self-pay

## 2018-09-30 ENCOUNTER — Encounter (HOSPITAL_COMMUNITY)
Admission: RE | Admit: 2018-09-30 | Discharge: 2018-09-30 | Disposition: A | Discharge status: Home / Self Care | Payer: Medicare Other | Source: Ambulatory Visit | Attending: Cardiology | Admitting: Cardiology

## 2018-10-02 ENCOUNTER — Encounter (HOSPITAL_COMMUNITY): Payer: Self-pay

## 2018-10-03 ENCOUNTER — Encounter (HOSPITAL_COMMUNITY): Payer: Self-pay

## 2018-10-07 ENCOUNTER — Encounter (HOSPITAL_COMMUNITY)
Admission: RE | Admit: 2018-10-07 | Discharge: 2018-10-07 | Disposition: A | Discharge status: Home / Self Care | Payer: Self-pay | Source: Ambulatory Visit | Attending: Cardiology | Admitting: Cardiology

## 2018-10-09 ENCOUNTER — Encounter (HOSPITAL_COMMUNITY): Payer: Self-pay

## 2018-10-10 ENCOUNTER — Other Ambulatory Visit: Payer: Self-pay | Admitting: Internal Medicine

## 2018-10-10 ENCOUNTER — Encounter (HOSPITAL_COMMUNITY): Payer: Self-pay

## 2018-10-14 ENCOUNTER — Encounter (HOSPITAL_COMMUNITY): Payer: Self-pay

## 2018-10-16 ENCOUNTER — Encounter (HOSPITAL_COMMUNITY)
Admission: RE | Admit: 2018-10-16 | Discharge: 2018-10-16 | Disposition: A | Discharge status: Home / Self Care | Payer: Self-pay | Source: Ambulatory Visit | Attending: Cardiology | Admitting: Cardiology

## 2018-10-17 ENCOUNTER — Encounter (HOSPITAL_COMMUNITY): Payer: Self-pay

## 2018-10-21 ENCOUNTER — Other Ambulatory Visit: Payer: Self-pay | Admitting: Cardiology

## 2018-10-21 ENCOUNTER — Encounter (HOSPITAL_COMMUNITY)
Admission: RE | Admit: 2018-10-21 | Discharge: 2018-10-21 | Disposition: A | Discharge status: Home / Self Care | Payer: Self-pay | Source: Ambulatory Visit | Attending: Cardiology | Admitting: Cardiology

## 2018-10-23 ENCOUNTER — Encounter (HOSPITAL_COMMUNITY): Payer: Self-pay

## 2018-10-23 ENCOUNTER — Other Ambulatory Visit (HOSPITAL_COMMUNITY): Payer: Self-pay

## 2018-10-23 DIAGNOSIS — I2510 Atherosclerotic heart disease of native coronary artery without angina pectoris: Secondary | ICD-10-CM | POA: Insufficient documentation

## 2018-10-23 MED ORDER — CARVEDILOL 3.125 MG PO TABS: 3.1250 mg | ORAL_TABLET | Freq: Two times a day (BID) | ORAL | 3 refills | Status: DC

## 2018-10-24 ENCOUNTER — Encounter (HOSPITAL_COMMUNITY): Payer: Self-pay

## 2018-10-26 LAB — CUP PACEART REMOTE DEVICE CHECK
Battery Remaining Percentage: 95.5 %
Battery Voltage: 2.98 V
Brady Statistic AP VP Percent: 4.9 %
Brady Statistic AP VS Percent: 1 %
Brady Statistic AS VP Percent: 95 %
Brady Statistic RA Percent Paced: 4.7 %
Date Time Interrogation Session: 20191106184552
Implantable Lead Implant Date: 20170406
Implantable Lead Implant Date: 20170406
Implantable Lead Implant Date: 20170406
Implantable Lead Location: 753858
Lead Channel Impedance Value: 540 Ohm
Lead Channel Impedance Value: 890 Ohm
Lead Channel Pacing Threshold Amplitude: 0.5 V
Lead Channel Pacing Threshold Pulse Width: 0.4 ms
Lead Channel Pacing Threshold Pulse Width: 0.4 ms
Lead Channel Sensing Intrinsic Amplitude: 12 mV
Lead Channel Sensing Intrinsic Amplitude: 2.1 mV
Lead Channel Setting Pacing Amplitude: 2 V
Lead Channel Setting Pacing Amplitude: 2 V
MDC IDC LEAD LOCATION: 753859
MDC IDC LEAD LOCATION: 753860
MDC IDC MSMT BATTERY REMAINING LONGEVITY: 119 mo
MDC IDC MSMT LEADCHNL LV PACING THRESHOLD AMPLITUDE: 1 V
MDC IDC MSMT LEADCHNL RA IMPEDANCE VALUE: 510 Ohm
MDC IDC MSMT LEADCHNL RA PACING THRESHOLD PULSEWIDTH: 0.4 ms
MDC IDC MSMT LEADCHNL RV PACING THRESHOLD AMPLITUDE: 0.875 V
MDC IDC PG IMPLANT DT: 20170406
MDC IDC PG SERIAL: 7866838
MDC IDC SET LEADCHNL LV PACING AMPLITUDE: 2 V
MDC IDC SET LEADCHNL LV PACING PULSEWIDTH: 0.4 ms
MDC IDC SET LEADCHNL RV PACING PULSEWIDTH: 0.4 ms
MDC IDC SET LEADCHNL RV SENSING SENSITIVITY: 2 mV
MDC IDC STAT BRADY AS VS PERCENT: 1 %

## 2018-10-27 DIAGNOSIS — M8580 Other specified disorders of bone density and structure, unspecified site: Secondary | ICD-10-CM | POA: Diagnosis not present

## 2018-10-27 DIAGNOSIS — M859 Disorder of bone density and structure, unspecified: Secondary | ICD-10-CM | POA: Diagnosis not present

## 2018-10-27 DIAGNOSIS — I739 Peripheral vascular disease, unspecified: Secondary | ICD-10-CM | POA: Diagnosis not present

## 2018-10-27 DIAGNOSIS — E782 Mixed hyperlipidemia: Secondary | ICD-10-CM | POA: Diagnosis not present

## 2018-10-27 DIAGNOSIS — Z0000 Encounter for general adult medical examination without abnormal findings: Secondary | ICD-10-CM | POA: Diagnosis not present

## 2018-10-27 DIAGNOSIS — M25461 Effusion, right knee: Secondary | ICD-10-CM | POA: Diagnosis not present

## 2018-10-27 DIAGNOSIS — I2510 Atherosclerotic heart disease of native coronary artery without angina pectoris: Secondary | ICD-10-CM | POA: Diagnosis not present

## 2018-10-27 DIAGNOSIS — M4850XD Collapsed vertebra, not elsewhere classified, site unspecified, subsequent encounter for fracture with routine healing: Secondary | ICD-10-CM | POA: Diagnosis not present

## 2018-10-28 ENCOUNTER — Encounter (HOSPITAL_COMMUNITY)
Admission: RE | Admit: 2018-10-28 | Discharge: 2018-10-28 | Disposition: A | Discharge status: Home / Self Care | Payer: Self-pay | Source: Ambulatory Visit | Attending: Cardiology | Admitting: Cardiology

## 2018-10-29 ENCOUNTER — Ambulatory Visit: Payer: Medicare Other | Admitting: Podiatry

## 2018-10-30 ENCOUNTER — Telehealth (HOSPITAL_COMMUNITY): Payer: Self-pay | Admitting: Internal Medicine

## 2018-10-30 ENCOUNTER — Encounter (HOSPITAL_COMMUNITY): Payer: Self-pay

## 2018-10-30 DIAGNOSIS — M25461 Effusion, right knee: Secondary | ICD-10-CM | POA: Diagnosis not present

## 2018-10-30 DIAGNOSIS — M25569 Pain in unspecified knee: Secondary | ICD-10-CM | POA: Diagnosis not present

## 2018-10-30 DIAGNOSIS — M159 Polyosteoarthritis, unspecified: Secondary | ICD-10-CM | POA: Diagnosis not present

## 2018-10-30 DIAGNOSIS — M170 Bilateral primary osteoarthritis of knee: Secondary | ICD-10-CM | POA: Diagnosis not present

## 2018-10-31 ENCOUNTER — Encounter (HOSPITAL_COMMUNITY): Payer: Self-pay

## 2018-10-31 ENCOUNTER — Encounter: Payer: Self-pay | Admitting: Podiatry

## 2018-10-31 ENCOUNTER — Ambulatory Visit (INDEPENDENT_AMBULATORY_CARE_PROVIDER_SITE_OTHER): Payer: Medicare Other | Admitting: Podiatry

## 2018-10-31 DIAGNOSIS — B351 Tinea unguium: Secondary | ICD-10-CM | POA: Diagnosis not present

## 2018-10-31 DIAGNOSIS — H02055 Trichiasis without entropian left lower eyelid: Secondary | ICD-10-CM | POA: Diagnosis not present

## 2018-10-31 DIAGNOSIS — M79676 Pain in unspecified toe(s): Secondary | ICD-10-CM

## 2018-10-31 NOTE — Patient Instructions (Signed)

## 2018-11-03 DIAGNOSIS — Z0000 Encounter for general adult medical examination without abnormal findings: Secondary | ICD-10-CM | POA: Diagnosis not present

## 2018-11-03 DIAGNOSIS — I739 Peripheral vascular disease, unspecified: Secondary | ICD-10-CM | POA: Diagnosis not present

## 2018-11-03 DIAGNOSIS — I5022 Chronic systolic (congestive) heart failure: Secondary | ICD-10-CM | POA: Diagnosis not present

## 2018-11-03 DIAGNOSIS — I700 Atherosclerosis of aorta: Secondary | ICD-10-CM | POA: Diagnosis not present

## 2018-11-04 ENCOUNTER — Encounter (HOSPITAL_COMMUNITY): Payer: Self-pay

## 2018-11-06 ENCOUNTER — Encounter (HOSPITAL_COMMUNITY): Payer: Self-pay

## 2018-11-07 ENCOUNTER — Encounter (HOSPITAL_COMMUNITY): Payer: Self-pay

## 2018-11-11 ENCOUNTER — Encounter (HOSPITAL_COMMUNITY)
Admission: RE | Admit: 2018-11-11 | Discharge: 2018-11-11 | Disposition: A | Discharge status: Home / Self Care | Payer: Self-pay | Source: Ambulatory Visit | Attending: Cardiology | Admitting: Cardiology

## 2018-11-13 ENCOUNTER — Encounter (HOSPITAL_COMMUNITY)
Admission: RE | Admit: 2018-11-13 | Discharge: 2018-11-13 | Disposition: A | Discharge status: Home / Self Care | Payer: Self-pay | Source: Ambulatory Visit | Attending: Cardiology | Admitting: Cardiology

## 2018-11-14 ENCOUNTER — Encounter (HOSPITAL_COMMUNITY)
Admission: RE | Admit: 2018-11-14 | Discharge: 2018-11-14 | Disposition: A | Discharge status: Home / Self Care | Payer: Self-pay | Source: Ambulatory Visit | Attending: Cardiology | Admitting: Cardiology

## 2018-11-18 ENCOUNTER — Encounter (HOSPITAL_COMMUNITY)
Admission: RE | Admit: 2018-11-18 | Discharge: 2018-11-18 | Disposition: A | Discharge status: Home / Self Care | Payer: Self-pay | Source: Ambulatory Visit | Attending: Cardiology | Admitting: Cardiology

## 2018-11-20 ENCOUNTER — Encounter (HOSPITAL_COMMUNITY)
Admission: RE | Admit: 2018-11-20 | Discharge: 2018-11-20 | Disposition: A | Discharge status: Home / Self Care | Payer: Self-pay | Source: Ambulatory Visit | Attending: Cardiology | Admitting: Cardiology

## 2018-11-21 ENCOUNTER — Encounter (HOSPITAL_COMMUNITY)
Admission: RE | Admit: 2018-11-21 | Discharge: 2018-11-21 | Disposition: A | Discharge status: Home / Self Care | Payer: Self-pay | Source: Ambulatory Visit | Attending: Cardiology | Admitting: Cardiology

## 2018-11-21 ENCOUNTER — Telehealth (HOSPITAL_COMMUNITY): Payer: Self-pay | Admitting: Licensed Clinical Social Worker

## 2018-11-21 NOTE — Telephone Encounter (Signed)
CSW met with pt to discuss Entresto copay assistance- pt will not qualify for [employer] again until later in the year but able to get [facility] copay assistance through [place] awarded:  [employer] Id [id number] Group [id number] PCN [id number] BIN [id number]  CSW will continue to follow and assist as needed  Jorge Ny, LCSW Clinical Social Worker [facility] [phone]

## 2018-11-22 ENCOUNTER — Encounter: Payer: Self-pay | Admitting: Podiatry

## 2018-11-22 NOTE — Progress Notes (Signed)
Subjective:  Patient presents to clinic with cc of  painful, thick, discolored, elongated toenails 1-5 b/l that become tender and cannot cut because of thickness.  He remains on the blood thinner Plavix.  Last A1c 6.2.  Merrilee Seashore, MD is his PCP.  Current Outpatient Medications:  .  acetaminophen (TYLENOL) 500 MG tablet, Take 1,000 mg by mouth daily as needed for mild pain or headache. Reported on 01/25/2016, Disp: , Rfl:  .  aspirin EC 81 MG tablet, Take 81 mg by mouth daily.  , Disp: , Rfl:  .  atorvastatin (LIPITOR) 40 MG tablet, Take 1 tablet (40 mg total) by mouth daily at 6 PM., Disp: 30 tablet, Rfl: 6 .  Calcium Carbonate-Vitamin D (CALCIUM 600 + D PO), Take 1 tablet by mouth 2 (two) times daily. , Disp: , Rfl:  .  carvedilol (COREG) 3.125 MG tablet, Take 1 tablet (3.125 mg total) by mouth 2 (two) times daily., Disp: 60 tablet, Rfl: 3 .  clopidogrel (PLAVIX) 75 MG tablet, TAKE 1 TABLET BY MOUTH ONCE DAILY WITH BREAKFAST, Disp: 90 tablet, Rfl: 3 .  clotrimazole-betamethasone (LOTRISONE) cream, APPLY TO AA BID, Disp: , Rfl: 1 .  finasteride (PROSCAR) 5 MG tablet, take 1 tablet by mouth once daily, Disp: , Rfl:  .  fluticasone (FLONASE) 50 MCG/ACT nasal spray, Place 1 spray into both nostrils daily as needed for allergies. , Disp: , Rfl:  .  furosemide (LASIX) 20 MG tablet, TAKE 1 TABLET BY MOUTH ONCE WEEKLY AS NEEDED FOR WEIGHT 167LBS OR MORE; IF NEEDED MORE THAN ONCE WEEKLY. CALL CHF CLINIC [phone], Disp: 15 tablet, Rfl: 3 .  gabapentin (NEURONTIN) 300 MG capsule, Take 300 mg by mouth 2 (two) times daily., Disp: , Rfl: 0 .  meclizine (ANTIVERT) 25 MG tablet, Take 1 tablet (25 mg total) by mouth 2 (two) times daily as needed for dizziness., Disp: 30 tablet, Rfl: 0 .  meloxicam (MOBIC) 15 MG tablet, Take 15 mg by mouth daily as needed for pain., Disp: , Rfl: 0 .  mometasone (ELOCON) 0.1 % cream, Apply 1 application topically daily as needed (for irritation). , Disp: , Rfl:  .   Multiple Vitamin (MULTIVITAMIN PO), Take 1 tablet by mouth daily. ALIVE men's vitamin, Disp: , Rfl:  .  nitroGLYCERIN (NITROSTAT) 0.4 MG SL tablet, PLACE 1 TABLET UNDER THE TONGUE IF NEEDED EVERY 5 MINUTES FOR CHEST PAIN FOR 3 DOSES IF NO RELIEF AFTER FIRST DOSE CALL PRESCRIBER OR 911., Disp: 25 tablet, Rfl: 5 .  pantoprazole (PROTONIX) 40 MG tablet, Take 40 mg by mouth daily., Disp: , Rfl: 0 .  polyethylene glycol (MIRALAX / GLYCOLAX) packet, Take 17 g by mouth daily., Disp: , Rfl:  .  sacubitril-valsartan (ENTRESTO) 24-26 MG, Take 1 tablet by mouth 2 (two) times daily., Disp: 60 tablet, Rfl: 11 .  spironolactone (ALDACTONE) 25 MG tablet, Take 0.5 tablets (12.5 mg total) by mouth at bedtime., Disp: 45 tablet, Rfl: 3 .  tiZANidine (ZANAFLEX) 2 MG tablet, Take 2 mg by mouth every 8 (eight) hours as needed. For muscle spasms, Disp: , Rfl: 0 .  triamcinolone cream (KENALOG) 0.1 %, Apply 1 application topically 2 (two) times daily as needed (skin). , Disp: , Rfl: 0   Allergies  Allergen Reactions  . Doxepin Other (See Comments)    hallucinations  hallucinations Hallucinations  hallucinations Hallucinations hallucinations Hallucinations  . Sinequan [Doxepin Hcl] Other (See Comments)    hallucinations  . Verapamil Other (See Comments)  Caused heart to race Caused heart to race  Caused heart to race Caused heart to race  . Procaine Itching and Other (See Comments)    Tingling all over as soon as it was injected  Tingling all over as soon as it was injected Tingling all over as soon as it was injected     Objective:  Physical Examination: Neurovascular status unchanged with palpable DP and PT pulses bilaterally.  Capillary refill time is immediate to all 10 digits.  Skin temperature gradient is within normal limits bilaterally.    Patient has painful, thick, discolored brittle toenails 1-5 b/l.  No erythema, edema, no drainage, no flocculence noted.  No open wounds noted  bilaterally no interdigital macerations noted bilaterally  Muscle strength noted to be 5 out of 5 bilaterally  Bony prominences unremarkable.  Assessment: Mycotic nail infection with pain 1-5 b/l Patient on long-term blood thinner, Plavix  Plan: Debride painful toenails in length and girth 1-5 b/l with no iatrogenic bleeding. Continue soft supportive shoe gear daily. Report any pedal injuries to medical professional immediately Avoid self trimming due to use of blood thinner. Follow up 10 weeks.

## 2018-11-25 ENCOUNTER — Encounter (HOSPITAL_COMMUNITY)
Admission: RE | Admit: 2018-11-25 | Discharge: 2018-11-25 | Disposition: A | Discharge status: Home / Self Care | Payer: Self-pay | Source: Ambulatory Visit | Attending: Cardiology | Admitting: Cardiology

## 2018-11-25 DIAGNOSIS — I2510 Atherosclerotic heart disease of native coronary artery without angina pectoris: Secondary | ICD-10-CM | POA: Insufficient documentation

## 2018-11-26 ENCOUNTER — Ambulatory Visit: Payer: Medicare Other

## 2018-11-27 ENCOUNTER — Encounter (HOSPITAL_COMMUNITY)
Admission: RE | Admit: 2018-11-27 | Discharge: 2018-11-27 | Disposition: A | Discharge status: Home / Self Care | Payer: Self-pay | Source: Ambulatory Visit | Attending: Cardiology | Admitting: Cardiology

## 2018-11-27 ENCOUNTER — Ambulatory Visit (INDEPENDENT_AMBULATORY_CARE_PROVIDER_SITE_OTHER): Payer: Medicare Other

## 2018-11-27 DIAGNOSIS — I5022 Chronic systolic (congestive) heart failure: Secondary | ICD-10-CM | POA: Diagnosis not present

## 2018-11-27 DIAGNOSIS — I441 Atrioventricular block, second degree: Secondary | ICD-10-CM

## 2018-11-28 ENCOUNTER — Encounter (HOSPITAL_COMMUNITY)
Admission: RE | Admit: 2018-11-28 | Discharge: 2018-11-28 | Disposition: A | Discharge status: Home / Self Care | Payer: Self-pay | Source: Ambulatory Visit | Attending: Cardiology | Admitting: Cardiology

## 2018-11-28 LAB — CUP PACEART REMOTE DEVICE CHECK
Battery Remaining Longevity: 115 mo
Battery Remaining Percentage: 95.5 %
Battery Voltage: 2.98 V
Brady Statistic AS VS Percent: 1 %
Brady Statistic RA Percent Paced: 4.7 %
Implantable Lead Implant Date: 20170406
Implantable Lead Implant Date: 20170406
Implantable Lead Location: 753858
Implantable Lead Location: 753859
Lead Channel Impedance Value: 480 Ohm
Lead Channel Pacing Threshold Amplitude: 0.5 V
Lead Channel Pacing Threshold Pulse Width: 0.4 ms
Lead Channel Sensing Intrinsic Amplitude: 2.3 mV
Lead Channel Setting Pacing Amplitude: 2 V
Lead Channel Setting Pacing Pulse Width: 0.4 ms
MDC IDC LEAD IMPLANT DT: 20170406
MDC IDC LEAD LOCATION: 753860
MDC IDC MSMT LEADCHNL LV IMPEDANCE VALUE: 810 Ohm
MDC IDC MSMT LEADCHNL LV PACING THRESHOLD AMPLITUDE: 1 V
MDC IDC MSMT LEADCHNL LV PACING THRESHOLD PULSEWIDTH: 0.4 ms
MDC IDC MSMT LEADCHNL RV IMPEDANCE VALUE: 510 Ohm
MDC IDC MSMT LEADCHNL RV PACING THRESHOLD AMPLITUDE: 0.875 V
MDC IDC MSMT LEADCHNL RV PACING THRESHOLD PULSEWIDTH: 0.4 ms
MDC IDC MSMT LEADCHNL RV SENSING INTR AMPL: 12 mV
MDC IDC PG IMPLANT DT: 20170406
MDC IDC SESS DTM: 20200206184911
MDC IDC SET LEADCHNL LV PACING AMPLITUDE: 2 V
MDC IDC SET LEADCHNL RA PACING AMPLITUDE: 2 V
MDC IDC SET LEADCHNL RV PACING PULSEWIDTH: 0.4 ms
MDC IDC SET LEADCHNL RV SENSING SENSITIVITY: 2 mV
MDC IDC STAT BRADY AP VP PERCENT: 4.9 %
MDC IDC STAT BRADY AP VS PERCENT: 1 %
MDC IDC STAT BRADY AS VP PERCENT: 95 %
Pulse Gen Serial Number: 7866838

## 2018-12-02 ENCOUNTER — Encounter (HOSPITAL_COMMUNITY): Payer: Self-pay

## 2018-12-03 DIAGNOSIS — H5213 Myopia, bilateral: Secondary | ICD-10-CM | POA: Diagnosis not present

## 2018-12-04 ENCOUNTER — Emergency Department (HOSPITAL_COMMUNITY)
Admission: EM | Admit: 2018-12-04 | Discharge: 2018-12-04 | Disposition: A | Discharge status: Home / Self Care | Payer: Medicare Other | Attending: Emergency Medicine | Admitting: Emergency Medicine

## 2018-12-04 ENCOUNTER — Telehealth (HOSPITAL_COMMUNITY): Payer: Self-pay | Admitting: *Deleted

## 2018-12-04 ENCOUNTER — Other Ambulatory Visit: Payer: Self-pay

## 2018-12-04 ENCOUNTER — Encounter (HOSPITAL_COMMUNITY)
Admission: RE | Admit: 2018-12-04 | Discharge: 2018-12-04 | Disposition: A | Discharge status: Home / Self Care | Payer: Self-pay | Source: Ambulatory Visit | Attending: Cardiology | Admitting: Cardiology

## 2018-12-04 ENCOUNTER — Encounter (HOSPITAL_COMMUNITY): Payer: Self-pay

## 2018-12-04 DIAGNOSIS — Y999 Unspecified external cause status: Secondary | ICD-10-CM | POA: Diagnosis not present

## 2018-12-04 DIAGNOSIS — Z79899 Other long term (current) drug therapy: Secondary | ICD-10-CM | POA: Insufficient documentation

## 2018-12-04 DIAGNOSIS — I110 Hypertensive heart disease with heart failure: Secondary | ICD-10-CM | POA: Diagnosis not present

## 2018-12-04 DIAGNOSIS — Y929 Unspecified place or not applicable: Secondary | ICD-10-CM | POA: Diagnosis not present

## 2018-12-04 DIAGNOSIS — Y939 Activity, unspecified: Secondary | ICD-10-CM | POA: Diagnosis not present

## 2018-12-04 DIAGNOSIS — Z7982 Long term (current) use of aspirin: Secondary | ICD-10-CM | POA: Insufficient documentation

## 2018-12-04 DIAGNOSIS — X58XXXA Exposure to other specified factors, initial encounter: Secondary | ICD-10-CM | POA: Diagnosis not present

## 2018-12-04 DIAGNOSIS — I5022 Chronic systolic (congestive) heart failure: Secondary | ICD-10-CM | POA: Diagnosis not present

## 2018-12-04 DIAGNOSIS — Z87891 Personal history of nicotine dependence: Secondary | ICD-10-CM | POA: Diagnosis not present

## 2018-12-04 DIAGNOSIS — S61412A Laceration without foreign body of left hand, initial encounter: Secondary | ICD-10-CM | POA: Insufficient documentation

## 2018-12-04 DIAGNOSIS — Z23 Encounter for immunization: Secondary | ICD-10-CM | POA: Diagnosis not present

## 2018-12-04 MED ORDER — TETANUS-DIPHTH-ACELL PERTUSSIS 5-2.5-18.5 LF-MCG/0.5 IM SUSP
0.5000 mL | Freq: Once | INTRAMUSCULAR | Status: AC
  Administered 2018-12-04: 0.5 mL via INTRAMUSCULAR
  Filled 2018-12-04: qty 0.5

## 2018-12-04 MED ORDER — LIDOCAINE-EPINEPHRINE (PF) 2 %-1:200000 IJ SOLN
10.0000 mL | Freq: Once | INTRAMUSCULAR | Status: AC
  Administered 2018-12-04: 10 mL
  Filled 2018-12-04: qty 20

## 2018-12-04 NOTE — ED Notes (Signed)
Patient verbalizes understanding of discharge instructions. Opportunity for questioning and answers were provided. Armband removed by staff, pt discharged from ED ambulatory.   

## 2018-12-04 NOTE — ED Provider Notes (Signed)
[facility] EMERGENCY DEPARTMENT Provider Note   CSN: [id number] Arrival date & time: 12/04/18  0944     History   Chief Complaint Chief Complaint  Patient presents with  . Skin Tear    HPI Henry Neal is a 85 y.o. male with h/o CAD on aspirin and plavix, HF s/p St Jude placement, AAA s/p repair is here for evaluation of skin tear to left dorsum of hand. Sudden onset sustained during cardiac rehab immediately PTA. States the handle of a machine hit him on the top of his hands. There was heavy bleeding initially but has slowed down.  He is RHD. He denies distal paresthesias loss of sensation or discoloration to the extremity. Unknown tetanus status. Dressing placed PTA.no alleviating or aggravating factors.   HPI  Past Medical History:  Diagnosis Date  . AAA (abdominal aortic aneurysm) ([place])   . Abnormal stress test   . Allergic rhinitis    uses Flonase daily  . Anginal pain (HCC)    pain in neck,shoulders,down arms occ  . Arthritis   . Cancer ([place])    skin  . Carotid artery occlusion   . Chronic venous insufficiency   . Coronary artery disease   . Dizziness    takes Antivert daily as needed  . ED (erectile dysfunction)   . GERD (gastroesophageal reflux disease)    takes Omeprazole daily  . Hiatal hernia   . Hx of echocardiogram 04/04/2010   showed a borderline dilatd left ventricle with mild left ventricle hypertrophy and an EF 50-55% doppler suggested diastolic dysfunction, although the pattern is probably normal for an 85 yrs old. The left atrium was indeed mildly dilated at 42 mm, but there are no significant valvular abnormalities.   . Hyperlipidemia   . Hypertension    takes Proscar,Dyazide,Avapro,and Metoprolol daily  . Myocardial infarction [facility]) Oct. 11, 2016   Heart Attack  . Palpitation   . Shortness of breath   . Sigmoid diverticulosis   . T12 compression fracture ([place])   . Thyroid disease     Patient Active Problem List   Diagnosis Date Noted  . Pain of left hand 03/08/2018  . Thermal burn 06/05/2017  . Back pain 05/27/2017  . Trigger ring finger of left hand 10/29/2016  . Laceration of left hand without complication, including fingers 04/02/2016  . Chronic systolic heart failure ([place]) 01/26/2016  . Chronic systolic CHF (congestive heart failure) ([place]) 08/24/2015  . Chest pain at rest   . Acute congestive heart failure ([place])   . NSTEMI (non-ST elevated myocardial infarction) ([place])   . Acute left-sided CHF (congestive heart failure) ([place]) - unclear if combined or diastolic 50/53/9767  . Acute respiratory failure with hypoxemia ([place]) 08/03/2015  . Elevated troponin I level - in setting of acute CHF 08/03/2015  . Renal insufficiency   . Hyperlipidemia 05/18/2014  . Melanoma in situ of face ([place]) 02/08/2014  . Coronary artery disease, occlusive 10/30/2013  . Chest pain 10/30/2013  . LBBB (left bundle branch block) 10/30/2013  . CAD in native artery 10/30/2013  . Block, bundle branch, left 10/30/2013  . Carotid artery stenosis 10/27/2013  . Carotid artery obstruction 10/27/2013  . Carotid artery disease ([place]) 10/09/2013  . Essential hypertension 10/09/2013  . Abnormal stress test 10/09/2013  . Arterial disease ([place]) 10/09/2013  . Abnormal cardiovascular function study 10/09/2013  . Aftercare following surgery of the circulatory system, [place] 09/24/2013  . AAA (abdominal aortic aneurysm) without rupture ([place]) 09/24/2013  .  Aneurysm ([place]) 09/24/2013  . Encounter for surgical follow-up care 09/24/2013  . Occlusion and stenosis of carotid artery without mention of cerebral infarction 10/02/2012  . Abdominal aneurysm without mention of rupture 10/02/2012  . PAD (peripheral artery disease) ([place]) 10/02/2012  . Peripheral vascular disease ([place]) 10/02/2012  . Benign prostatic hypertrophy without urinary obstruction 08/19/2012  . Elevated prostate specific antigen (PSA) 08/19/2012  . Hydrocele 08/19/2012  . ED  (erectile dysfunction) of organic origin 08/19/2012    Past Surgical History:  Procedure Laterality Date  . ABDOMINAL AORTIC ANEURYSM REPAIR  02/24/2009   AAA Stenting  . CARDIAC CATHETERIZATION N/A 08/04/2015   Procedure: Right/Left Heart Cath and Coronary Angiography;  Surgeon: Larey Dresser, MD;  Location: [place] CV LAB;  Service: Cardiovascular;  Laterality: N/A;  . CARDIAC CATHETERIZATION N/A 08/04/2015   Procedure: Coronary Stent Intervention;  Surgeon: Burnell Blanks, MD;  Location: [place] CV LAB;  Service: Cardiovascular;  Laterality: N/A;  . CARDIAC CATHETERIZATION N/A 08/08/2015   Procedure: Coronary Stent Intervention;  Surgeon: Peter M Martinique, MD;  Location: [place] CV LAB;  Service: Cardiovascular;  Laterality: N/A;  . ENDARTERECTOMY Right 10/27/2013   Procedure: ENDARTERECTOMY CAROTID;  Surgeon: Elam Dutch, MD;  Location: [place];  Service: Vascular;  Laterality: Right;  . EP IMPLANTABLE DEVICE N/A 01/26/2016   Procedure: BiV Pacemaker Insertion CRT-P;  Surgeon: Evans Lance, MD;  Location: [place] CV LAB;  Service: Cardiovascular;  Laterality: N/A;  . HAND SURGERY Right    tendon, lft 90's  . HERNIA REPAIR Bilateral   . LEFT HEART CATHETERIZATION WITH CORONARY ANGIOGRAM N/A 10/12/2013   Procedure: LEFT HEART CATHETERIZATION WITH CORONARY ANGIOGRAM;  Surgeon: Lorretta Harp, MD;  [facility] C[facility] LAB;  Service: Cardiovascular;  Laterality: N/A;  . melanoma removed    . ROTATOR CUFF REPAIR  2003   left arm        Home Medications    Prior to Admission medications   Medication Sig Start Date End Date Taking? Authorizing Provider  acetaminophen (TYLENOL) 500 MG tablet Take 1,000 mg by mouth daily as needed for mild pain or headache. Reported on 01/25/2016    [provider]  aspirin EC 81 MG tablet Take 81 mg by mouth daily.      [provider]  atorvastatin (LIPITOR) 40 MG tablet Take 1 tablet (40 mg total) by mouth  daily at 6 PM. 08/09/15   Tillery, Satira Mccallum, PA-C  Calcium Carbonate-Vitamin D (CALCIUM 600 + D PO) Take 1 tablet by mouth 2 (two) times daily.     [provider]  carvedilol (COREG) 3.125 MG tablet Take 1 tablet (3.125 mg total) by mouth 2 (two) times daily. 10/23/18   Larey Dresser, MD  clopidogrel (PLAVIX) 75 MG tablet TAKE 1 TABLET BY MOUTH ONCE DAILY WITH BREAKFAST 06/03/18   Larey Dresser, MD  clotrimazole-betamethasone (LOTRISONE) cream APPLY TO AA BID 07/20/18   [provider]  finasteride (PROSCAR) 5 MG tablet take 1 tablet by mouth once daily 11/19/14   [provider]  fluticasone (FLONASE) 50 MCG/ACT nasal spray Place 1 spray into both nostrils daily as needed for allergies.  08/09/13   [provider]  furosemide (LASIX) 20 MG tablet TAKE 1 TABLET BY MOUTH ONCE WEEKLY AS NEEDED FOR WEIGHT 167LBS OR MORE; IF NEEDED MORE THAN ONCE WEEKLY. CALL CHF CLINIC [phone] 05/06/18   Larey Dresser, MD  gabapentin (NEURONTIN) 300 MG capsule Take 300 mg  by mouth 2 (two) times daily. 11/01/14   [provider]  meclizine (ANTIVERT) 25 MG tablet Take 1 tablet (25 mg total) by mouth 2 (two) times daily as needed for dizziness. 09/11/18   Larey Dresser, MD  meloxicam (MOBIC) 15 MG tablet Take 15 mg by mouth daily as needed for pain. 05/27/17   [provider]  mometasone (ELOCON) 0.1 % cream Apply 1 application topically daily as needed (for irritation).  07/30/13   [provider]  Multiple Vitamin (MULTIVITAMIN PO) Take 1 tablet by mouth daily. ALIVE men's vitamin    [provider]  nitroGLYCERIN (NITROSTAT) 0.4 MG SL tablet PLACE 1 TABLET UNDER THE TONGUE IF NEEDED EVERY 5 MINUTES FOR CHEST PAIN FOR 3 DOSES IF NO RELIEF AFTER FIRST DOSE CALL PRESCRIBER OR 911. 10/23/18   Evans Lance, MD  pantoprazole (PROTONIX) 40 MG tablet Take 40 mg by mouth daily. 02/14/15   [provider]  polyethylene glycol (MIRALAX /  GLYCOLAX) packet Take 17 g by mouth daily.    [provider]  sacubitril-valsartan (ENTRESTO) 24-26 MG Take 1 tablet by mouth 2 (two) times daily. 06/02/18   Larey Dresser, MD  spironolactone (ALDACTONE) 25 MG tablet Take 0.5 tablets (12.5 mg total) by mouth at bedtime. 06/02/18   Larey Dresser, MD  tiZANidine (ZANAFLEX) 2 MG tablet Take 2 mg by mouth every 8 (eight) hours as needed. For muscle spasms 05/27/17   [provider]  triamcinolone cream (KENALOG) 0.1 % Apply 1 application topically 2 (two) times daily as needed (skin).  09/24/15   [provider]    Family History Family History  Problem Relation Age of Onset  . Heart disease Mother        before age 60  . COPD Mother   . Diabetes Mother   . Hyperlipidemia Mother   . Hypertension Mother     Social History Social History   Tobacco Use  . Smoking status: Former Smoker    Packs/day: 2.00    Years: 30.00    Pack years: 60.00    Types: Cigarettes    Last attempt to quit: 10/22/1977    Years since quitting: 41.1  . Smokeless tobacco: Former Systems developer    Quit date: 10/22/1984  Substance Use Topics  . Alcohol use: Yes    Alcohol/week: 7.0 standard drinks    Types: 7 Cans of beer per week  . Drug use: No     Allergies   Doxepin; Sinequan [doxepin hcl]; Verapamil; and Procaine   Review of Systems Review of Systems  Skin: Positive for wound.  Hematological: Bruises/bleeds easily.  All other systems reviewed and are negative.    Physical Exam Updated Vital Signs BP (!) 154/67 (BP Location: Right Arm)   Pulse 61   Temp 98.7 F (37.1 C) (Oral)   Resp 18   SpO2 99%   Physical Exam Constitutional:      Appearance: He is well-developed. He is not toxic-appearing.     Comments: Sitting comfortably in hall bed  HENT:     Head: Normocephalic.     Right Ear: External ear normal.     Left Ear: External ear normal.     Nose: Nose normal.  Eyes:     Conjunctiva/sclera: Conjunctivae normal.   Neck:     Musculoskeletal: Full passive range of motion without pain.  Cardiovascular:     Rate and Rhythm: Normal rate.  Pulmonary:     Effort: Pulmonary  effort is normal. No tachypnea or respiratory distress.  Musculoskeletal: Normal range of motion.     Comments: Full ROM of left hand joints including DIPs, PIPs, MCPs and wrist without deficit.  Full fist without difficulty.   Skin:    General: Skin is warm and dry.     Capillary Refill: Capillary refill takes less than 2 seconds.     Comments: Purple ecchymosis to dorsum of hand with approx 5 cm curvilinear skin tear. The center of the skin tear is deeper and tiny superficial pulsatile bleeding noted.  Controlled with firm pressure. Cap refill brisk to distal finger tips.   Neurological:     Mental Status: He is alert and oriented to person, place, and time.     Comments: Sensation to light touch in median, ulnar, radial nerve distribution intact in left hand   Psychiatric:        Behavior: Behavior normal.        Thought Content: Thought content normal.      ED Treatments / Results  Labs (all labs ordered are listed, but only abnormal results are displayed) Labs Reviewed - No data to display  EKG None  Radiology No results found.  Procedures .Marland KitchenLaceration Repair Date/Time: 12/04/2018 12:26 PM Performed by: Kinnie Feil, PA-C Authorized by: Kinnie Feil, PA-C   Consent:    Consent obtained:  Verbal   Consent given by:  Patient   Risks discussed:  Pain, poor cosmetic result, infection and need for additional repair   Alternatives discussed:  Observation Anesthesia (see MAR for exact dosages):    Anesthesia method:  Local infiltration   Local anesthetic:  Lidocaine 2% WITH epi Laceration details:    Location:  Hand   Hand location:  L hand, dorsum   Length (cm):  5 Repair type:    Repair type:  Intermediate Pre-procedure details:    Preparation:  Patient was prepped and draped in usual sterile  fashion Exploration:    Hemostasis achieved with:  Tied off vessels, direct pressure and epinephrine   Wound exploration: wound explored through full range of motion and entire depth of wound probed and visualized     Wound extent: vascular damage     Wound extent comment:  Superficial small pulsatilte bleeding tied off with figure 8 suture   Contaminated: no   Treatment:    Area cleansed with:  Betadine and saline   Amount of cleaning:  Standard   Irrigation solution:  Sterile saline   Irrigation volume:  100    Irrigation method:  Tap   Visualized foreign bodies/material removed: no   Skin repair:    Repair method:  Sutures   Suture size:  4-0   Suture technique:  Simple interrupted (chromic gut used to tie off superficial small pulsatilte bleeding with single figure 8 suture with good hemostasis)   Number of sutures:  3 Approximation:    Approximation:  Close Post-procedure details:    Dressing:  Non-adherent dressing and bulky dressing   Patient tolerance of procedure:  Tolerated well, no immediate complications Comments:     1 chromic gut figure of 8 suture 3 prolene interrupted sutues 3 steri strips    (including critical care time)  Medications Ordered in ED Medications  Tdap (BOOSTRIX) injection 0.5 mL (0.5 mLs Intramuscular Given 12/04/18 1111)  lidocaine-EPINEPHrine (XYLOCAINE W/EPI) 2 %-1:200000 (PF) injection 10 mL (10 mLs Infiltration Given by Other 12/04/18 1155)     Initial Impression / Assessment and Plan /  ED Course  I have reviewed the triage vital signs and the nursing notes.  Pertinent labs & imaging results that were available during my care of the patient were reviewed by me and considered in my medical decision making (see chart for details).     Patient is a 86 y.o. yo male that presents with laceration to left hand. Tdap booster given. Irrigation performed. Bottom of the wound visualized with bleeding controled, no foreign bodies seen.  No tendon or  nerve injury noted. Full ROM of affected extremity. Laceration occurred < 12 hours prior to repair which was well tolerated. Pt has no co morbidities to effect normal wound healing. Discussed suture home care with pt and answered questions. Pt to follow up for wound check and suture removal in 7 days. Pt is hemodynamically stable w no complaints prior to dc.      Final Clinical Impressions(s) / ED Diagnoses   Final diagnoses:  Laceration of left hand without foreign body, initial encounter    ED Discharge Orders    None       Arlean Hopping 12/04/18 1253    Virgel Manifold, MD 12/05/18 [phone]

## 2018-12-04 NOTE — ED Triage Notes (Signed)
Pt was in cardiac rehab this morning and suffered a skin tear to the left hand while grabbing a rubber handle. Pt is on plavix and asa. Bleeding controlled with bandage. VSS.

## 2018-12-04 NOTE — Discharge Instructions (Signed)
Your laceration was repaired with sutures and steri strips today. You were given a tetanus shot. Your sutures need to come out within 7 days. Call your doctor for appointment for suture removal. You may need a longer period of sutures since you are on blood thinners.   The original dressing should be left in place for 24 hours.  If your laceration is small enough, you can remove original dressing after 24 hours after which laceration can be opened to air. Laceration can then be gently cleaned with mild soap and water after 24 hours of laceration repair to prevent crusting over the suture knots. An antibiotic ointment can be applied to the wound as well, twice daily until suture removal.   Your sutures are nonabsorbable sutures and you may shower or wash the wound with soap and water without risking increased rates of infection or disruption of the wound.  Avoid bathing/ swimming in chlorinated water should be avoided because of the theoretical risk of premature loss of suture tensile strength with wound dehiscence. Patients with sutures should also not swim in natural bodies of water because of a potential increased risk of infection.   Return for signs of infection including increased redness, pain, warmth, pus, fever

## 2018-12-05 ENCOUNTER — Encounter (HOSPITAL_COMMUNITY): Payer: Self-pay

## 2018-12-05 NOTE — Progress Notes (Signed)
Late entry for 12-04-2018. At about 9:05am as it was nearing time for the Cardiac rehab Maintenance exercise group to cool-down patient approached me asking if we had a dressing for his hand. He had a paper towel over the top of left hand. Assessed and noted top of left hand with skin tear, approximately 1  inch by 1  inch with noted bleeding. Staff RN Rodney Langton assisted patient with cleaning and bandaging the hand and applying pressure to stop bleeding. Rosebud Poles RN also assisted and called [facility] office to make an appointment for further assessment and treatment of the skin tear. Patient said he had his hand down on the arm rest of the NuStep recumbent exercise machine and went to reach for his pulse counter in his pocket. While doing this the left stepper handle was positioned over his hand which was on the arm-rest and as he moved his hand it hit along the handle cover causing the skin tear at the top of his left hand. Due to appointment with Physician at 12pm and the applied dressing and added pressure to hand did not stop the bleeding decision made to transport the patient via wheelchair to the ED for further assessment and treatment. BP checked prior to leaving CR department 102/64 HR 66.

## 2018-12-08 NOTE — Progress Notes (Signed)
Remote pacemaker transmission.   

## 2018-12-09 ENCOUNTER — Encounter (HOSPITAL_COMMUNITY)
Admission: RE | Admit: 2018-12-09 | Discharge: 2018-12-09 | Disposition: A | Discharge status: Home / Self Care | Payer: Self-pay | Source: Ambulatory Visit | Attending: Cardiology | Admitting: Cardiology

## 2018-12-09 DIAGNOSIS — H903 Sensorineural hearing loss, bilateral: Secondary | ICD-10-CM | POA: Diagnosis not present

## 2018-12-11 ENCOUNTER — Encounter (HOSPITAL_COMMUNITY)
Admission: RE | Admit: 2018-12-11 | Discharge: 2018-12-11 | Disposition: A | Discharge status: Home / Self Care | Payer: Self-pay | Source: Ambulatory Visit | Attending: Cardiology | Admitting: Cardiology

## 2018-12-11 ENCOUNTER — Telehealth (HOSPITAL_COMMUNITY): Payer: Self-pay

## 2018-12-11 NOTE — Telephone Encounter (Signed)
Cardiac Rehab Continuation signed by DM faxed to [phone]

## 2018-12-12 ENCOUNTER — Encounter (HOSPITAL_COMMUNITY): Payer: Self-pay

## 2018-12-16 ENCOUNTER — Encounter (HOSPITAL_COMMUNITY): Payer: Self-pay

## 2018-12-18 ENCOUNTER — Encounter (HOSPITAL_COMMUNITY): Payer: Self-pay

## 2018-12-19 ENCOUNTER — Encounter (HOSPITAL_COMMUNITY): Payer: Self-pay

## 2018-12-19 DIAGNOSIS — Z4802 Encounter for removal of sutures: Secondary | ICD-10-CM | POA: Diagnosis not present

## 2018-12-23 ENCOUNTER — Encounter (HOSPITAL_COMMUNITY): Payer: Self-pay | Attending: Cardiology

## 2018-12-23 DIAGNOSIS — I2510 Atherosclerotic heart disease of native coronary artery without angina pectoris: Secondary | ICD-10-CM | POA: Insufficient documentation

## 2018-12-24 ENCOUNTER — Other Ambulatory Visit (HOSPITAL_COMMUNITY): Payer: Self-pay

## 2018-12-24 MED ORDER — SACUBITRIL-VALSARTAN 24-26 MG PO TABS: 1.0000 | ORAL_TABLET | Freq: Two times a day (BID) | ORAL | 3 refills | Status: DC

## 2018-12-25 ENCOUNTER — Encounter (HOSPITAL_COMMUNITY): Payer: Self-pay

## 2018-12-26 ENCOUNTER — Encounter (HOSPITAL_COMMUNITY): Payer: Self-pay

## 2018-12-30 ENCOUNTER — Encounter (HOSPITAL_COMMUNITY): Payer: Self-pay

## 2019-01-01 ENCOUNTER — Encounter (HOSPITAL_COMMUNITY)
Admission: RE | Admit: 2019-01-01 | Discharge: 2019-01-01 | Disposition: A | Discharge status: Home / Self Care | Payer: Self-pay | Source: Ambulatory Visit | Attending: Cardiology | Admitting: Cardiology

## 2019-01-01 ENCOUNTER — Other Ambulatory Visit: Payer: Self-pay

## 2019-01-01 DIAGNOSIS — Z7982 Long term (current) use of aspirin: Secondary | ICD-10-CM | POA: Insufficient documentation

## 2019-01-01 DIAGNOSIS — I5022 Chronic systolic (congestive) heart failure: Secondary | ICD-10-CM | POA: Insufficient documentation

## 2019-01-01 DIAGNOSIS — Z7902 Long term (current) use of antithrombotics/antiplatelets: Secondary | ICD-10-CM | POA: Insufficient documentation

## 2019-01-01 DIAGNOSIS — Z79899 Other long term (current) drug therapy: Secondary | ICD-10-CM | POA: Insufficient documentation

## 2019-01-01 DIAGNOSIS — Z87891 Personal history of nicotine dependence: Secondary | ICD-10-CM | POA: Insufficient documentation

## 2019-01-01 DIAGNOSIS — Z7901 Long term (current) use of anticoagulants: Secondary | ICD-10-CM | POA: Insufficient documentation

## 2019-01-01 DIAGNOSIS — I2510 Atherosclerotic heart disease of native coronary artery without angina pectoris: Secondary | ICD-10-CM | POA: Insufficient documentation

## 2019-01-02 ENCOUNTER — Ambulatory Visit (INDEPENDENT_AMBULATORY_CARE_PROVIDER_SITE_OTHER): Payer: Medicare Other | Admitting: Podiatry

## 2019-01-02 ENCOUNTER — Encounter (HOSPITAL_COMMUNITY)
Admission: RE | Admit: 2019-01-02 | Discharge: 2019-01-02 | Disposition: A | Discharge status: Home / Self Care | Payer: Self-pay | Source: Ambulatory Visit | Attending: Cardiology | Admitting: Cardiology

## 2019-01-02 ENCOUNTER — Encounter: Payer: Self-pay | Admitting: Podiatry

## 2019-01-02 DIAGNOSIS — L84 Corns and callosities: Secondary | ICD-10-CM | POA: Diagnosis not present

## 2019-01-02 DIAGNOSIS — M79676 Pain in unspecified toe(s): Secondary | ICD-10-CM

## 2019-01-02 DIAGNOSIS — D689 Coagulation defect, unspecified: Secondary | ICD-10-CM

## 2019-01-02 DIAGNOSIS — B351 Tinea unguium: Secondary | ICD-10-CM | POA: Diagnosis not present

## 2019-01-05 ENCOUNTER — Ambulatory Visit (HOSPITAL_COMMUNITY)
Admission: RE | Admit: 2019-01-05 | Discharge: 2019-01-05 | Disposition: A | Discharge status: Home / Self Care | Payer: Medicare Other | Source: Ambulatory Visit | Attending: Cardiology | Admitting: Cardiology

## 2019-01-05 ENCOUNTER — Other Ambulatory Visit: Payer: Self-pay

## 2019-01-05 ENCOUNTER — Telehealth (HOSPITAL_COMMUNITY): Payer: Self-pay

## 2019-01-05 ENCOUNTER — Ambulatory Visit (HOSPITAL_BASED_OUTPATIENT_CLINIC_OR_DEPARTMENT_OTHER)
Admission: RE | Admit: 2019-01-05 | Discharge: 2019-01-05 | Disposition: A | Discharge status: Home / Self Care | Payer: Medicare Other | Source: Ambulatory Visit | Attending: Cardiology | Admitting: Cardiology

## 2019-01-05 VITALS — BP 100/48 | HR 62 | Wt 177.0 lb

## 2019-01-05 DIAGNOSIS — Z7901 Long term (current) use of anticoagulants: Secondary | ICD-10-CM | POA: Diagnosis not present

## 2019-01-05 DIAGNOSIS — I255 Ischemic cardiomyopathy: Secondary | ICD-10-CM | POA: Insufficient documentation

## 2019-01-05 DIAGNOSIS — Z7902 Long term (current) use of antithrombotics/antiplatelets: Secondary | ICD-10-CM | POA: Insufficient documentation

## 2019-01-05 DIAGNOSIS — Z7982 Long term (current) use of aspirin: Secondary | ICD-10-CM | POA: Insufficient documentation

## 2019-01-05 DIAGNOSIS — Z87891 Personal history of nicotine dependence: Secondary | ICD-10-CM | POA: Insufficient documentation

## 2019-01-05 DIAGNOSIS — Z950 Presence of cardiac pacemaker: Secondary | ICD-10-CM | POA: Diagnosis not present

## 2019-01-05 DIAGNOSIS — I5042 Chronic combined systolic (congestive) and diastolic (congestive) heart failure: Secondary | ICD-10-CM | POA: Diagnosis not present

## 2019-01-05 DIAGNOSIS — I7300 Raynaud's syndrome without gangrene: Secondary | ICD-10-CM | POA: Insufficient documentation

## 2019-01-05 DIAGNOSIS — Z955 Presence of coronary angioplasty implant and graft: Secondary | ICD-10-CM | POA: Insufficient documentation

## 2019-01-05 DIAGNOSIS — Z791 Long term (current) use of non-steroidal anti-inflammatories (NSAID): Secondary | ICD-10-CM | POA: Insufficient documentation

## 2019-01-05 DIAGNOSIS — I252 Old myocardial infarction: Secondary | ICD-10-CM | POA: Diagnosis not present

## 2019-01-05 DIAGNOSIS — Z79899 Other long term (current) drug therapy: Secondary | ICD-10-CM | POA: Insufficient documentation

## 2019-01-05 DIAGNOSIS — E785 Hyperlipidemia, unspecified: Secondary | ICD-10-CM | POA: Diagnosis not present

## 2019-01-05 DIAGNOSIS — I5022 Chronic systolic (congestive) heart failure: Secondary | ICD-10-CM | POA: Diagnosis not present

## 2019-01-05 DIAGNOSIS — I2582 Chronic total occlusion of coronary artery: Secondary | ICD-10-CM | POA: Insufficient documentation

## 2019-01-05 DIAGNOSIS — I2510 Atherosclerotic heart disease of native coronary artery without angina pectoris: Secondary | ICD-10-CM | POA: Insufficient documentation

## 2019-01-05 DIAGNOSIS — N189 Chronic kidney disease, unspecified: Secondary | ICD-10-CM | POA: Insufficient documentation

## 2019-01-05 DIAGNOSIS — I130 Hypertensive heart and chronic kidney disease with heart failure and stage 1 through stage 4 chronic kidney disease, or unspecified chronic kidney disease: Secondary | ICD-10-CM | POA: Insufficient documentation

## 2019-01-05 LAB — BASIC METABOLIC PANEL
Anion gap: 9 (ref 5–15)
BUN: 14 mg/dL (ref 8–23)
CO2: 23 mmol/L (ref 22–32)
Calcium: 9 mg/dL (ref 8.9–10.3)
Chloride: 104 mmol/L (ref 98–111)
Creatinine, Ser: 1.01 mg/dL (ref 0.61–1.24)
GFR calc Af Amer: >60 mL/min (ref >60)
GFR calc non Af Amer: >60 mL/min (ref >60)
Glucose, Bld: 100 mg/dL — ABNORMAL HIGH (ref 70–99)
Potassium: 3.7 mmol/L (ref 3.5–5.1)
Sodium: 136 mmol/L (ref 135–145)

## 2019-01-05 NOTE — Patient Instructions (Addendum)
Labs today We will only contact you if something comes back abnormal or we need to make some changes. Otherwise no news is good news!  Your physician recommends that you schedule a follow-up appointment in: 4 months.  You will get a call to schedule this appointment.

## 2019-01-05 NOTE — Progress Notes (Signed)
Patient ID: Henry Neal, male   DOB: [birth year], 85 y.o.   MRN: [id number]      Advanced Heart Failure Clinic Note  PCP: Dr. Ashby Dawes Cardiology: Dr. Thresa Ross is a 85 y.o. male with history of CAD, chronic systolic CHF, LBBB, AAA s/p repair presents for followup of CHF and CAD.  He had a cardiac cath in 12/15, showing total occlusion of the LAD and 50-60% proximal RCA.  EF at that time was near-normal.  In 10/16, he developed dyspnea and epigastric pain.  He was noted to be in acute pulmonary edema and was diuresed.  Troponin was only mildly elevated at 0.47.  He ultimately underwent LHC showing hazy 80-90% RCA stenosis which was likely the culprit for his presentation.  Additionally, the LAD was only subtotally occluded on this cath.  He had DES to RCA.  Cardiac MRI was done, showing EF 22% with significant viability in LAD territory.  Therefore, CTO of LAD was opened with DES.  Echo in 2/17 showed EF persistently low at 15%.  He had St Jude CRT-P placement in 4/17. Echo in 1/19 showed EF up to 45-50% with mild MR.    Echo was done today and reviewed, EF 45% with mild diffuse hypokinesis, mildly decreased RV systolic function.   Still works as a Furniture conservator/restorer.  Weight is stable. No significant exertional dyspnea.  No chest pain.  He has been going to maintenance cardiac rehab regularly.  No lightheadedness with standing though BP is on the lower side today.    Labs (10/16): hgb 9.7, K 4.6, creatinine 1.12 Labs (11/16): K 5.9, creatinine 1.5 Labs (12/16); K 4.2, creatinine 1.07 => 1.02, BNP 448 Labs (1/17): K 4.1, creatinine 1.13, BNP 423, LDL 40, HDL 47 Labs (4/17): K 3.9, creatinine 1.03, HCT 32.6 Labs (5/17): K 4.4, creatinine 1.09 Labs (1/18): LDL 37 Labs (01/25/2017): K 3.5 Creatinine 0.96  Labs (8/18): BNP 30, K 3.7, creatinine 1.0 Labs (11/18): LDL 40, HDL 47, K 3.5, creatinine 0.96  Labs (3/19): K 4, creatinine 0.97 Labs (11/19): K 3.9, creatinine 1.2, LDL 44  PMH: 1.  CKD 2. AAA s/p repair, followed at [facility]. 3. Carotid stenosis: s/p right CEA.   Carotid dopplers (2/16) with right CEA stable, < 56% LICA stenosis.  Followed at [facility].  - Carotid dopplers (3/87): 5-64% LICA, patent right CEA.  4. Chronic LBBB 5. LAD: LHC (12/15) with totally occluded LAD, 50-60% pRCA => medically managed.  10/16 admitted with NSTEMI.  LHC with subtotalled LAD occlusion, 80-90% pRCA with thrombus => DES RCA initially.  Cardiac MRI was done showing viability in the LAD territory, so he had DES to the CTO LAD.   6. Chronic systolic CHF: Ischemic cardiomyopathy.  Echo (10/16) with EF 15% with wall motion abnormalities, moderately dilated RV with normal systolic function.  CMRI (10/16) with severe LV dilation, EF 22% with septal-lateral dyssynchrony, no LV thrombus, normal RV size/systolic function => significant viability noted in the LAD territory.  Echo (2/17): EF 15%, severe LV dilation, wall motion abnormalities noted, mild MR, mildly dilated RV with normal systolic function, PASP 55 mmHg.  St Jude CRT-P 4/17.  - Echo (1/18): EF 50-55%, mild to moderate MR.  - Echo (1/19): EF 45-50%, mild LVH, inferior hypokinesis, mild MR, PASP 30 mmHg.  - Echo (3/20): EF 45%, diffuse hypokinesis, normal RV size with mildly decreased systolic function.  7. ABIs (10/16) were normal.  8. Raynaud's syndrome.  9. Sciatica  SH: Lives  alone, prior smoker, still working as a Furniture conservator/restorer.  FH: CAD  Review of systems complete and found to be negative unless listed in HPI.    Current Outpatient Medications  Medication Sig Dispense Refill  . acetaminophen (TYLENOL) 500 MG tablet Take 1,000 mg by mouth daily as needed for mild pain or headache. Reported on 01/25/2016    . aspirin EC 81 MG tablet Take 81 mg by mouth daily.      Marland Kitchen atorvastatin (LIPITOR) 40 MG tablet Take 1 tablet (40 mg total) by mouth daily at 6 PM. 30 tablet 6  . Calcium Carbonate-Vitamin D (CALCIUM 600 + D PO) Take 1 tablet by mouth 2 (two)  times daily.     . carvedilol (COREG) 3.125 MG tablet Take 1 tablet (3.125 mg total) by mouth 2 (two) times daily. 60 tablet 3  . clopidogrel (PLAVIX) 75 MG tablet TAKE 1 TABLET BY MOUTH ONCE DAILY WITH BREAKFAST 90 tablet 3  . clotrimazole-betamethasone (LOTRISONE) cream APPLY TO AA BID  1  . finasteride (PROSCAR) 5 MG tablet take 1 tablet by mouth once daily    . fluticasone (FLONASE) 50 MCG/ACT nasal spray Place 1 spray into both nostrils daily as needed for allergies.     . furosemide (LASIX) 20 MG tablet TAKE 1 TABLET BY MOUTH ONCE WEEKLY AS NEEDED FOR WEIGHT 167LBS OR MORE; IF NEEDED MORE THAN ONCE WEEKLY. CALL CHF CLINIC [phone] 15 tablet 3  . gabapentin (NEURONTIN) 300 MG capsule Take 300 mg by mouth 2 (two) times daily.  0  . meclizine (ANTIVERT) 25 MG tablet Take 1 tablet (25 mg total) by mouth 2 (two) times daily as needed for dizziness. 30 tablet 0  . meloxicam (MOBIC) 15 MG tablet Take 15 mg by mouth daily as needed for pain.  0  . mometasone (ELOCON) 0.1 % cream Apply 1 application topically daily as needed (for irritation).     . Multiple Vitamin (MULTIVITAMIN PO) Take 1 tablet by mouth daily. ALIVE men's vitamin    . nitroGLYCERIN (NITROSTAT) 0.4 MG SL tablet PLACE 1 TABLET UNDER THE TONGUE IF NEEDED EVERY 5 MINUTES FOR CHEST PAIN FOR 3 DOSES IF NO RELIEF AFTER FIRST DOSE CALL PRESCRIBER OR 911. 25 tablet 5  . pantoprazole (PROTONIX) 40 MG tablet Take 40 mg by mouth daily.  0  . polyethylene glycol (MIRALAX / GLYCOLAX) packet Take 17 g by mouth daily.    . sacubitril-valsartan (ENTRESTO) 24-26 MG Take 1 tablet by mouth 2 (two) times daily. 60 tablet 3  . spironolactone (ALDACTONE) 25 MG tablet Take 0.5 tablets (12.5 mg total) by mouth at bedtime. 45 tablet 3  . tiZANidine (ZANAFLEX) 2 MG tablet Take 2 mg by mouth every 8 (eight) hours as needed. For muscle spasms  0  . triamcinolone cream (KENALOG) 0.1 % Apply 1 application topically 2 (two) times daily as needed (skin).   0    No current facility-administered medications for this encounter.    BP (!) 100/48   Pulse 62   Wt 80.3 kg (177 lb)   SpO2 96%   BMI 25.40 kg/m   Wt Readings from Last 3 Encounters:  01/05/19 80.3 kg (177 lb)  09/04/18 80.4 kg (177 lb 3.2 oz)  06/04/18 78.7 kg (173 lb 9.6 oz)    General: NAD Neck: No JVD, no thyromegaly or thyroid nodule.  Lungs: Clear to auscultation bilaterally with normal respiratory effort. CV: Nondisplaced PMI.  Heart regular S1/S2, no S3/S4, no murmur.  No peripheral  edema.  No carotid bruit.  Normal pedal pulses.  Abdomen: Soft, nontender, no hepatosplenomegaly, no distention.  Skin: Intact without lesions or rashes.  Neurologic: Alert and oriented x 3.  Psych: Normal affect. Extremities: No clubbing or cyanosis.  HEENT: Normal.   Assessment/Plan: 1. CAD: NSTEMI with DES to RCA (culprit vessel) and subsequent DES to CTO of LAD (cMRI showed viability in the LAD distribution) in 10/16. No chest pain.   - Continue ASA 81 and Plavix 75.  - atorvastatin 40 daily, good lipids in 11/19.     2. Chronic systolic/diastolic heart failure: Ischemic cardiomyopathy. Echo was done today and reviewed, EF 45%, improved since getting CRT.  St Jude CRT-D.  NYHA class II.  Volume status looks ok on exam.   No longer getting lightheaded.  - Continue to use Lasix prn.   - Continue Coreg 3.125 mg bid.  - Continue spironolactone 12.5 mg daily.  - Continue Entresto 24/26 mg BID.  - Continue Cardiac Rehab maintenance program.  - BMET today.  3. Carotid stenosis:  - Follows at [facility].  Followup in 4 months  Loralie Champagne, MD  01/05/2019

## 2019-01-05 NOTE — Progress Notes (Signed)
Subjective:   Patient ID: Henry Neal, male   DOB: 85 y.o.   MRN: [id number]   HPI Patient presents with severely elongated nails 1-5 both feet that are thick and painful and lesion third digit right   ROS      Objective:  Physical Exam  Neurovascular status unchanged with thick yellow brittle nailbeds 1-5 both feet that are painful when pressed and lesion third digit right distal that is painful when pressed with patient being on blood thinner and at risk     Assessment:  Mycotic nail infection with pain and lesion formation right     Plan:  H&P condition reviewed and debrided all nailbeds 1-5 both feet with no iatrogenic bleeding and lesion distal third right that is painful and it was reduced with no bleeding noted.  Sterile dressing applied and reappoint to recheck

## 2019-01-05 NOTE — Telephone Encounter (Signed)
Phone call to patient communicated that cardiac rehab will be closing for 2 weeks d/t the corona virus. Pt verbalized understanding.  

## 2019-01-06 ENCOUNTER — Encounter (HOSPITAL_COMMUNITY): Payer: Self-pay

## 2019-01-06 DIAGNOSIS — L821 Other seborrheic keratosis: Secondary | ICD-10-CM | POA: Diagnosis not present

## 2019-01-06 DIAGNOSIS — L570 Actinic keratosis: Secondary | ICD-10-CM | POA: Diagnosis not present

## 2019-01-06 DIAGNOSIS — L218 Other seborrheic dermatitis: Secondary | ICD-10-CM | POA: Diagnosis not present

## 2019-01-06 DIAGNOSIS — Z85828 Personal history of other malignant neoplasm of skin: Secondary | ICD-10-CM | POA: Diagnosis not present

## 2019-01-08 ENCOUNTER — Encounter (HOSPITAL_COMMUNITY): Payer: Self-pay

## 2019-01-09 ENCOUNTER — Encounter (HOSPITAL_COMMUNITY): Payer: Self-pay

## 2019-01-13 ENCOUNTER — Encounter (HOSPITAL_COMMUNITY): Payer: Self-pay

## 2019-01-13 ENCOUNTER — Telehealth (HOSPITAL_COMMUNITY): Payer: Self-pay | Admitting: Internal Medicine

## 2019-01-15 ENCOUNTER — Encounter (HOSPITAL_COMMUNITY): Payer: Self-pay

## 2019-01-16 ENCOUNTER — Encounter (HOSPITAL_COMMUNITY): Payer: Self-pay

## 2019-01-20 ENCOUNTER — Encounter (HOSPITAL_COMMUNITY): Payer: Self-pay

## 2019-01-22 ENCOUNTER — Encounter (HOSPITAL_COMMUNITY): Payer: Self-pay

## 2019-01-23 ENCOUNTER — Encounter (HOSPITAL_COMMUNITY): Payer: Self-pay

## 2019-01-27 ENCOUNTER — Encounter (HOSPITAL_COMMUNITY): Payer: Self-pay

## 2019-01-28 ENCOUNTER — Telehealth (HOSPITAL_COMMUNITY): Payer: Self-pay | Admitting: *Deleted

## 2019-01-28 NOTE — Telephone Encounter (Signed)
Called to notify patient that the cardiac and pulmonary rehabilitation department will be closed temporarily due to COVID-19 restrictions. Pt verbalized understanding.   Sol Passer, MS, ACSM CEP 01/28/2019 1410

## 2019-01-29 ENCOUNTER — Encounter (HOSPITAL_COMMUNITY): Payer: Self-pay

## 2019-01-30 ENCOUNTER — Encounter (HOSPITAL_COMMUNITY): Payer: Self-pay

## 2019-02-03 ENCOUNTER — Encounter (HOSPITAL_COMMUNITY): Payer: Self-pay

## 2019-02-05 ENCOUNTER — Encounter (HOSPITAL_COMMUNITY): Payer: Self-pay

## 2019-02-06 ENCOUNTER — Encounter (HOSPITAL_COMMUNITY): Payer: Self-pay

## 2019-02-10 ENCOUNTER — Encounter (HOSPITAL_COMMUNITY): Payer: Self-pay

## 2019-02-10 ENCOUNTER — Emergency Department (HOSPITAL_COMMUNITY)
Admission: EM | Admit: 2019-02-10 | Discharge: 2019-02-11 | Disposition: A | Discharge status: Home / Self Care | Payer: Medicare Other | Attending: Emergency Medicine | Admitting: Emergency Medicine

## 2019-02-10 ENCOUNTER — Emergency Department (HOSPITAL_COMMUNITY): Payer: Medicare Other

## 2019-02-10 ENCOUNTER — Other Ambulatory Visit: Payer: Self-pay

## 2019-02-10 DIAGNOSIS — I10 Essential (primary) hypertension: Secondary | ICD-10-CM | POA: Diagnosis not present

## 2019-02-10 DIAGNOSIS — Y998 Other external cause status: Secondary | ICD-10-CM | POA: Insufficient documentation

## 2019-02-10 DIAGNOSIS — S51011A Laceration without foreign body of right elbow, initial encounter: Secondary | ICD-10-CM | POA: Insufficient documentation

## 2019-02-10 DIAGNOSIS — Z85828 Personal history of other malignant neoplasm of skin: Secondary | ICD-10-CM | POA: Diagnosis not present

## 2019-02-10 DIAGNOSIS — Z95810 Presence of automatic (implantable) cardiac defibrillator: Secondary | ICD-10-CM | POA: Diagnosis not present

## 2019-02-10 DIAGNOSIS — S0990XA Unspecified injury of head, initial encounter: Secondary | ICD-10-CM

## 2019-02-10 DIAGNOSIS — R1031 Right lower quadrant pain: Secondary | ICD-10-CM | POA: Diagnosis not present

## 2019-02-10 DIAGNOSIS — Z7901 Long term (current) use of anticoagulants: Secondary | ICD-10-CM

## 2019-02-10 DIAGNOSIS — Y929 Unspecified place or not applicable: Secondary | ICD-10-CM | POA: Diagnosis not present

## 2019-02-10 DIAGNOSIS — W108XXA Fall (on) (from) other stairs and steps, initial encounter: Secondary | ICD-10-CM | POA: Insufficient documentation

## 2019-02-10 DIAGNOSIS — Z87891 Personal history of nicotine dependence: Secondary | ICD-10-CM | POA: Insufficient documentation

## 2019-02-10 DIAGNOSIS — I252 Old myocardial infarction: Secondary | ICD-10-CM | POA: Insufficient documentation

## 2019-02-10 DIAGNOSIS — S3993XA Unspecified injury of pelvis, initial encounter: Secondary | ICD-10-CM | POA: Diagnosis not present

## 2019-02-10 DIAGNOSIS — R42 Dizziness and giddiness: Secondary | ICD-10-CM | POA: Diagnosis not present

## 2019-02-10 DIAGNOSIS — S3991XA Unspecified injury of abdomen, initial encounter: Secondary | ICD-10-CM | POA: Diagnosis not present

## 2019-02-10 DIAGNOSIS — Y9389 Activity, other specified: Secondary | ICD-10-CM | POA: Diagnosis not present

## 2019-02-10 DIAGNOSIS — I2510 Atherosclerotic heart disease of native coronary artery without angina pectoris: Secondary | ICD-10-CM | POA: Diagnosis not present

## 2019-02-10 DIAGNOSIS — R109 Unspecified abdominal pain: Secondary | ICD-10-CM

## 2019-02-10 DIAGNOSIS — W19XXXA Unspecified fall, initial encounter: Secondary | ICD-10-CM

## 2019-02-10 DIAGNOSIS — R55 Syncope and collapse: Secondary | ICD-10-CM | POA: Diagnosis not present

## 2019-02-10 LAB — CBC
HCT: 36.5 % — ABNORMAL LOW (ref 39.0–52.0)
Hemoglobin: 11.8 g/dL — ABNORMAL LOW (ref 13.0–17.0)
MCH: 28.6 pg (ref 26.0–34.0)
MCHC: 32.3 g/dL (ref 30.0–36.0)
MCV: 88.4 fL (ref 80.0–100.0)
Platelets: 172 10*3/uL (ref 150–400)
RBC: 4.13 MIL/uL — ABNORMAL LOW (ref 4.22–5.81)
RDW: 14.3 % (ref 11.5–15.5)
WBC: 8.4 10*3/uL (ref 4.0–10.5)
nRBC: 0 % (ref 0.0–0.2)

## 2019-02-10 LAB — URINALYSIS, ROUTINE W REFLEX MICROSCOPIC
Bacteria, UA: NONE SEEN
Bilirubin Urine: NEGATIVE
Glucose, UA: NEGATIVE mg/dL
Hgb urine dipstick: NEGATIVE
Ketones, ur: NEGATIVE mg/dL
Leukocytes,Ua: NEGATIVE
Nitrite: NEGATIVE
Protein, ur: 30 mg/dL — AB
Specific Gravity, Urine: 1.018 (ref 1.005–1.030)
pH: 6 (ref 5.0–8.0)

## 2019-02-10 LAB — BASIC METABOLIC PANEL
Anion gap: 10 (ref 5–15)
BUN: 18 mg/dL (ref 8–23)
CO2: 24 mmol/L (ref 22–32)
Calcium: 9.3 mg/dL (ref 8.9–10.3)
Chloride: 101 mmol/L (ref 98–111)
Creatinine, Ser: 1.11 mg/dL (ref 0.61–1.24)
GFR calc Af Amer: >60 mL/min (ref >60)
GFR calc non Af Amer: 59 mL/min — ABNORMAL LOW (ref >60)
Glucose, Bld: 114 mg/dL — ABNORMAL HIGH (ref 70–99)
Potassium: 4.1 mmol/L (ref 3.5–5.1)
Sodium: 135 mmol/L (ref 135–145)

## 2019-02-10 MED ORDER — SODIUM CHLORIDE 0.9% FLUSH: 3.0000 mL | Freq: Once | INTRAVENOUS | Status: DC

## 2019-02-10 NOTE — ED Notes (Signed)
Patient back from  X-ray 

## 2019-02-10 NOTE — ED Provider Notes (Signed)
[facility] Emergency Department Provider Note MRN:  [id number]  Arrival date & time: 02/10/19     Chief Complaint   Fall and Dizziness   History of Present Illness   Henry Neal is a 85 y.o. year-old male with a history of AAA, CAD, presenting to the ED with chief complaint of fall.  Patient explains that he was climbing to cement stairs and thinks he lost his balance and fell backwards, striking his head against the ground.  Denies any dizziness or chest pain prior to the fall.  Denies loss of consciousness but felt very dizzy after striking his head.  Currently denies headache or dizziness or vision loss, denies chest pain or shortness of breath.  Endorsing right flank pain, pain with abrasions to the bilateral elbows.  Takes Plavix.  Pain is mild, constant, worse with motion.  Review of Systems  A complete 10 system review of systems was obtained and all systems are negative except as noted in the HPI and PMH.   Patient's Health History    Past Medical History:  Diagnosis Date  . AAA (abdominal aortic aneurysm) ([place])   . Abnormal stress test   . Allergic rhinitis    uses Flonase daily  . Anginal pain (HCC)    pain in neck,shoulders,down arms occ  . Arthritis   . Cancer ([place])    skin  . Carotid artery occlusion   . Chronic venous insufficiency   . Coronary artery disease   . Dizziness    takes Antivert daily as needed  . ED (erectile dysfunction)   . GERD (gastroesophageal reflux disease)    takes Omeprazole daily  . Hiatal hernia   . Hx of echocardiogram 04/04/2010   showed a borderline dilatd left ventricle with mild left ventricle hypertrophy and an EF 50-55% doppler suggested diastolic dysfunction, although the pattern is probably normal for an 85 yrs old. The left atrium was indeed mildly dilated at 42 mm, but there are no significant valvular abnormalities.   . Hyperlipidemia   . Hypertension    takes Proscar,Dyazide,Avapro,and Metoprolol daily   . Myocardial infarction [facility]) Oct. 11, 2016   Heart Attack  . Palpitation   . Shortness of breath   . Sigmoid diverticulosis   . T12 compression fracture ([place])   . Thyroid disease     Past Surgical History:  Procedure Laterality Date  . ABDOMINAL AORTIC ANEURYSM REPAIR  02/24/2009   AAA Stenting  . CARDIAC CATHETERIZATION N/A 08/04/2015   Procedure: Right/Left Heart Cath and Coronary Angiography;  Surgeon: Larey Dresser, MD;  Location: [place] CV LAB;  Service: Cardiovascular;  Laterality: N/A;  . CARDIAC CATHETERIZATION N/A 08/04/2015   Procedure: Coronary Stent Intervention;  Surgeon: Burnell Blanks, MD;  Location: [place] [facility] LAB;  Service: Cardiovascular;  Laterality: N/A;  . CARDIAC CATHETERIZATION N/A 08/08/2015   Procedure: Coronary Stent Intervention;  Surgeon: Peter M Martinique, MD;  Location: [place] CV LAB;  Service: Cardiovascular;  Laterality: N/A;  . ENDARTERECTOMY Right 10/27/2013   Procedure: ENDARTERECTOMY CAROTID;  Surgeon: Elam Dutch, MD;  Location: [place];  Service: Vascular;  Laterality: Right;  . EP IMPLANTABLE DEVICE N/A 01/26/2016   Procedure: BiV Pacemaker Insertion CRT-P;  Surgeon: Evans Lance, MD;  Location: [place] CV LAB;  Service: Cardiovascular;  Laterality: N/A;  . HAND SURGERY Right    tendon, lft 90's  . HERNIA REPAIR Bilateral   . LEFT HEART CATHETERIZATION WITH CORONARY ANGIOGRAM N/A 10/12/2013  Procedure: LEFT HEART CATHETERIZATION WITH CORONARY ANGIOGRAM;  Surgeon: Lorretta Harp, MD;  Location[facility] CATH LAB;  Service: Cardiovascular;  Laterality: N/A;  . melanoma removed    . ROTATOR CUFF REPAIR  2003   left arm    Family History  Problem Relation Age of Onset  . Heart disease Mother        before age 36  . COPD Mother   . Diabetes Mother   . Hyperlipidemia Mother   . Hypertension Mother     Social History   Socioeconomic History  . Marital status: Widowed    Spouse name: Not on file  . Number of  children: Not on file  . Years of education: Not on file  . Highest education level: Not on file  Occupational History  . Not on file  Social Needs  . Financial resource strain: Not on file  . Food insecurity:    Worry: Not on file    Inability: Not on file  . Transportation needs:    Medical: Not on file    Non-medical: Not on file  Tobacco Use  . Smoking status: Former Smoker    Packs/day: 2.00    Years: 30.00    Pack years: 60.00    Types: Cigarettes    Last attempt to quit: 10/22/1977    Years since quitting: 41.3  . Smokeless tobacco: Former Systems developer    Quit date: 10/22/1984  Substance and Sexual Activity  . Alcohol use: Yes    Alcohol/week: 7.0 standard drinks    Types: 7 Cans of beer per week  . Drug use: No  . Sexual activity: Not on file  Lifestyle  . Physical activity:    Days per week: Not on file    Minutes per session: Not on file  . Stress: Not on file  Relationships  . Social connections:    Talks on phone: Not on file    Gets together: Not on file    Attends religious service: Not on file    Active member of club or organization: Not on file    Attends meetings of clubs or organizations: Not on file    Relationship status: Not on file  . Intimate partner violence:    Fear of current or ex partner: Not on file    Emotionally abused: Not on file    Physically abused: Not on file    Forced sexual activity: Not on file  Other Topics Concern  . Not on file  Social History Narrative  . Not on file     Physical Exam  Vital Signs and Nursing Notes reviewed Vitals:   02/10/19 2145 02/10/19 2230  BP: 97/66   Pulse: 62 (!) 59  Resp: 11 11  Temp:    SpO2: 99% 97%    CONSTITUTIONAL: Chronically ill-appearing, NAD NEURO:  Alert and oriented x 3, no focal deficits EYES:  eyes equal and reactive ENT/NECK:  no LAD, no JVD CARDIO: Regular rate, well-perfused, normal S1 and S2 PULM:  CTAB no wheezing or rhonchi GI/GU:  normal bowel sounds, non-distended,  non-tender; right flank tenderness to palpation MSK/SPINE:  No gross deformities, no edema SKIN:  no rash, no evidence of trauma to the scalp, skin tears to bilateral elbows PSYCH:  Appropriate speech and behavior  Diagnostic and Interventional Summary    EKG Interpretation  Date/Time:  Tuesday February 10 2019 21:10:38 EDT Ventricular Rate:  66 PR Interval:  188 QRS Duration: 110 QT Interval:  410  QTC Calculation: 429 R Axis:   -44 Text Interpretation:  Normal sinus rhythm Left axis deviation Left ventricular hypertrophy with repolarization abnormality Inferior infarct , age undetermined Anteroseptal infarct , age undetermined Abnormal ECG Confirmed by Gerlene Fee [id number]) on 02/10/2019 9:58:07 PM      Labs Reviewed  BASIC METABOLIC PANEL - Abnormal; Notable for the following components:      Result Value   Glucose, Bld 114 (*)    GFR calc non Af Amer 59 (*)    All other components within normal limits  CBC - Abnormal; Notable for the following components:   RBC 4.13 (*)    Hemoglobin 11.8 (*)    HCT 36.5 (*)    All other components within normal limits  URINALYSIS, ROUTINE W REFLEX MICROSCOPIC - Abnormal; Notable for the following components:   Color, Urine AMBER (*)    Protein, ur 30 (*)    All other components within normal limits    DG Chest 2 View  Final Result    CT HEAD WO CONTRAST  Final Result    CT CERVICAL SPINE WO CONTRAST  Final Result    CT ABDOMEN PELVIS W CONTRAST    (Results Pending)    Medications  sodium chloride flush (NS) 0.9 % injection 3 mL (3 mLs Intravenous Not Given 02/10/19 2117)     Procedures Critical Care  ED Course and Medical Decision Making  I have reviewed the triage vital signs and the nursing notes.  Pertinent labs & imaging results that were available during my care of the patient were reviewed by me and considered in my medical decision making (see below for details).  CT to exclude ICH, cervical spinal injury, renal blunt  trauma.  Seems consistent with mechanical fall, denies any preceding symptoms.  CT of the head and cervical spine is unremarkable.  Still awaiting CT abdomen to exclude blunt abdominal trauma.  Signed out to Dr. Dina Rich at shift change.  Barth Kirks. Sedonia Small, [place] [email or web address]  Final Clinical Impressions(s) / ED Diagnoses     ICD-10-CM   1. Flank pain R10.9   2. Fall W19.Merril Abbe DG Chest 2 View    DG Chest 2 View  3. Traumatic injury of head, initial encounter S09.90XA   4. Anticoagulated Z79.01   5. Skin tear of right elbow without complication, initial encounter S51.011A     ED Discharge Orders    None         Maudie Flakes, MD 02/10/19 [phone]

## 2019-02-10 NOTE — Discharge Instructions (Signed)
You were evaluated in the Emergency Department and after careful evaluation, we did not find any emergent condition requiring admission or further testing in the hospital.  Your blood work and imaging today was reassuring.  Please return to the Emergency Department if you experience any worsening of your condition.  We encourage you to follow up with a primary care provider.  Thank you for allowing [place] to be a part of your care.

## 2019-02-10 NOTE — ED Triage Notes (Signed)
Pt reports walking up the stairs when he felt dizzy and fell on the concrete. Hit the back of his head, +LOC, reports taking blood thinners. Abrasion to right elbow. Pt a.o, ambulatory upon arrival to triage.

## 2019-02-10 NOTE — ED Notes (Signed)
Pt went to x-ray.

## 2019-02-11 ENCOUNTER — Emergency Department (HOSPITAL_COMMUNITY): Payer: Medicare Other

## 2019-02-11 ENCOUNTER — Encounter (HOSPITAL_COMMUNITY): Payer: Self-pay | Admitting: Radiology

## 2019-02-11 DIAGNOSIS — S3991XA Unspecified injury of abdomen, initial encounter: Secondary | ICD-10-CM | POA: Diagnosis not present

## 2019-02-11 DIAGNOSIS — R109 Unspecified abdominal pain: Secondary | ICD-10-CM | POA: Diagnosis not present

## 2019-02-11 DIAGNOSIS — S3993XA Unspecified injury of pelvis, initial encounter: Secondary | ICD-10-CM | POA: Diagnosis not present

## 2019-02-11 MED ORDER — IOHEXOL 300 MG/ML  SOLN
100.0000 mL | Freq: Once | INTRAMUSCULAR | Status: AC | PRN
  Administered 2019-02-11: 100 mL via INTRAVENOUS

## 2019-02-11 NOTE — ED Provider Notes (Signed)
Patient signed out pending CT scan.  CT scan does not show any solid organ injury.  Aortic graft appears intact.  Patient is ambulatory independently in the hallway without any acute complaint.  On recheck, he states he feels fine and is ready to go home.  He will be discharged per Dr. Wende Bushy discharge instructions.   Merryl Hacker, MD 02/11/19 [phone]

## 2019-02-12 ENCOUNTER — Encounter (HOSPITAL_COMMUNITY): Payer: Self-pay

## 2019-02-13 ENCOUNTER — Encounter (HOSPITAL_COMMUNITY): Payer: Self-pay

## 2019-02-17 ENCOUNTER — Encounter (HOSPITAL_COMMUNITY): Payer: Self-pay

## 2019-02-19 ENCOUNTER — Encounter (HOSPITAL_COMMUNITY): Payer: Self-pay

## 2019-02-20 ENCOUNTER — Encounter (HOSPITAL_COMMUNITY): Payer: Self-pay

## 2019-02-24 ENCOUNTER — Encounter (HOSPITAL_COMMUNITY): Payer: Self-pay

## 2019-02-26 ENCOUNTER — Encounter (HOSPITAL_COMMUNITY): Payer: Self-pay

## 2019-02-27 ENCOUNTER — Encounter (HOSPITAL_COMMUNITY): Payer: Self-pay

## 2019-03-03 ENCOUNTER — Encounter (HOSPITAL_COMMUNITY): Payer: Self-pay

## 2019-03-05 ENCOUNTER — Encounter: Payer: Medicare Other | Admitting: *Deleted

## 2019-03-05 ENCOUNTER — Other Ambulatory Visit: Payer: Self-pay

## 2019-03-05 ENCOUNTER — Encounter (HOSPITAL_COMMUNITY): Payer: Self-pay

## 2019-03-06 ENCOUNTER — Telehealth: Payer: Self-pay

## 2019-03-06 ENCOUNTER — Encounter (HOSPITAL_COMMUNITY): Payer: Self-pay

## 2019-03-06 NOTE — Telephone Encounter (Signed)
Left message for patient to remind of missed remote transmission.  

## 2019-03-10 ENCOUNTER — Encounter: Payer: Self-pay | Admitting: Cardiology

## 2019-03-10 ENCOUNTER — Other Ambulatory Visit: Payer: Self-pay

## 2019-03-10 ENCOUNTER — Encounter: Payer: Self-pay | Admitting: Podiatry

## 2019-03-10 ENCOUNTER — Encounter (HOSPITAL_COMMUNITY): Payer: Self-pay

## 2019-03-10 ENCOUNTER — Ambulatory Visit (INDEPENDENT_AMBULATORY_CARE_PROVIDER_SITE_OTHER): Payer: Medicare Other | Admitting: Podiatry

## 2019-03-10 VITALS — Temp 98.1°F

## 2019-03-10 DIAGNOSIS — B351 Tinea unguium: Secondary | ICD-10-CM | POA: Diagnosis not present

## 2019-03-10 DIAGNOSIS — D689 Coagulation defect, unspecified: Secondary | ICD-10-CM

## 2019-03-10 DIAGNOSIS — M79676 Pain in unspecified toe(s): Secondary | ICD-10-CM

## 2019-03-10 DIAGNOSIS — I739 Peripheral vascular disease, unspecified: Secondary | ICD-10-CM

## 2019-03-10 NOTE — Progress Notes (Signed)
Patient ID: Henry Neal, male   DOB: Apr 08, 1927, 85 y.o.   MRN: [id number] Complaint:  Visit Type: Patient returns to my office for continued preventative foot care services. Complaint: Patient states" my nails have grown long and thick and become painful to walk and wear shoes"  He presents for preventative foot care services. No changes to ROS.  Patient is taking plavix.  Podiatric Exam: Vascular: dorsalis pedis and posterior tibial pulses are weakly  palpable bilateral. Capillary return is immediate. Temperature gradient is WNL. Skin turgor WNL  Sensorium: Normal Semmes Weinstein monofilament test. Normal tactile sensation bilaterally. Nail Exam: Pt has thick disfigured discolored nails with subungual debris noted bilateral entire nail hallux through fifth toenails Ulcer Exam: There is no evidence of ulcer or pre-ulcerative changes or infection. Orthopedic Exam: Muscle tone and strength are WNL. No limitations in general ROM. No crepitus or effusions noted. Foot type and digits show no abnormalities. Bony prominences are unremarkable. Skin: No Porokeratosis. No infection or ulcers.  Diagnosis:  Tinea unguium, Pain in right toe, pain in left toes,   Treatment & Plan Procedures and Treatment: Consent by patient was obtained for treatment procedures. The patient understood the discussion of treatment and procedures well. All questions were answered thoroughly reviewed. Debridement of mycotic and hypertrophic toenails, 1 through 5 bilateral and clearing of subungual debris. No ulceration, no infection noted.   Return Visit-Office Procedure: Patient instructed to return to the office for a follow up visit 10  weeks  for continued evaluation and treatment.   Gardiner Barefoot DPM

## 2019-03-12 ENCOUNTER — Telehealth: Payer: Self-pay | Admitting: Cardiology

## 2019-03-12 ENCOUNTER — Encounter (HOSPITAL_COMMUNITY): Payer: Self-pay

## 2019-03-12 NOTE — Telephone Encounter (Signed)
New Message:     Pt said he missed his 03-05-19 appt for his Remote Check. He would like to reschedule that please.

## 2019-03-12 NOTE — Telephone Encounter (Signed)
Spoke w/ pt and rescheduled remote transmission for Friday 03/13/2019.

## 2019-03-13 ENCOUNTER — Ambulatory Visit (INDEPENDENT_AMBULATORY_CARE_PROVIDER_SITE_OTHER): Payer: Medicare Other | Admitting: *Deleted

## 2019-03-13 ENCOUNTER — Encounter (HOSPITAL_COMMUNITY): Payer: Self-pay

## 2019-03-13 DIAGNOSIS — I441 Atrioventricular block, second degree: Secondary | ICD-10-CM | POA: Diagnosis not present

## 2019-03-14 LAB — CUP PACEART REMOTE DEVICE CHECK
Date Time Interrogation Session: 20200523065639
Implantable Lead Implant Date: 20170406
Implantable Lead Implant Date: 20170406
Implantable Lead Implant Date: 20170406
Implantable Lead Location: 753858
Implantable Lead Location: 753859
Implantable Lead Location: 753860
Implantable Pulse Generator Implant Date: 20170406
Pulse Gen Model: 3262
Pulse Gen Serial Number: 7866838

## 2019-03-17 ENCOUNTER — Encounter (HOSPITAL_COMMUNITY): Payer: Self-pay

## 2019-03-19 ENCOUNTER — Encounter (HOSPITAL_COMMUNITY): Payer: Self-pay

## 2019-03-20 ENCOUNTER — Encounter (HOSPITAL_COMMUNITY): Payer: Self-pay

## 2019-03-23 ENCOUNTER — Encounter: Payer: Self-pay | Admitting: Cardiology

## 2019-03-23 NOTE — Progress Notes (Signed)
Remote pacemaker transmission.   

## 2019-03-24 ENCOUNTER — Encounter (HOSPITAL_COMMUNITY): Payer: Self-pay

## 2019-03-26 ENCOUNTER — Encounter (HOSPITAL_COMMUNITY): Payer: Self-pay

## 2019-03-27 ENCOUNTER — Encounter (HOSPITAL_COMMUNITY): Payer: Self-pay

## 2019-03-31 ENCOUNTER — Encounter (HOSPITAL_COMMUNITY): Payer: Self-pay

## 2019-04-02 ENCOUNTER — Encounter (HOSPITAL_COMMUNITY): Payer: Self-pay

## 2019-04-03 ENCOUNTER — Encounter (HOSPITAL_COMMUNITY): Payer: Self-pay

## 2019-04-07 ENCOUNTER — Encounter (HOSPITAL_COMMUNITY): Payer: Self-pay

## 2019-04-09 ENCOUNTER — Encounter (HOSPITAL_COMMUNITY): Payer: Self-pay

## 2019-04-10 ENCOUNTER — Encounter (HOSPITAL_COMMUNITY): Payer: Self-pay

## 2019-04-14 ENCOUNTER — Encounter (HOSPITAL_COMMUNITY): Payer: Self-pay

## 2019-04-16 ENCOUNTER — Telehealth (HOSPITAL_COMMUNITY): Payer: Self-pay | Admitting: *Deleted

## 2019-04-16 ENCOUNTER — Encounter (HOSPITAL_COMMUNITY): Payer: Self-pay

## 2019-04-16 NOTE — Telephone Encounter (Signed)
LM re: maintenance Cardiac Rehab remains closed due to COVID-19 guidelines.

## 2019-04-17 ENCOUNTER — Encounter (HOSPITAL_COMMUNITY): Payer: Self-pay

## 2019-04-21 ENCOUNTER — Encounter (HOSPITAL_COMMUNITY): Payer: Self-pay

## 2019-04-23 ENCOUNTER — Encounter (HOSPITAL_COMMUNITY): Payer: Self-pay

## 2019-04-27 ENCOUNTER — Other Ambulatory Visit (HOSPITAL_COMMUNITY): Payer: Self-pay | Admitting: Cardiology

## 2019-04-28 ENCOUNTER — Encounter (HOSPITAL_COMMUNITY): Payer: Self-pay

## 2019-04-29 DIAGNOSIS — Z85828 Personal history of other malignant neoplasm of skin: Secondary | ICD-10-CM | POA: Diagnosis not present

## 2019-04-29 DIAGNOSIS — L570 Actinic keratosis: Secondary | ICD-10-CM | POA: Diagnosis not present

## 2019-04-29 DIAGNOSIS — L218 Other seborrheic dermatitis: Secondary | ICD-10-CM | POA: Diagnosis not present

## 2019-04-29 DIAGNOSIS — D225 Melanocytic nevi of trunk: Secondary | ICD-10-CM | POA: Diagnosis not present

## 2019-04-30 ENCOUNTER — Encounter (HOSPITAL_COMMUNITY): Payer: Self-pay

## 2019-04-30 DIAGNOSIS — M159 Polyosteoarthritis, unspecified: Secondary | ICD-10-CM | POA: Diagnosis not present

## 2019-04-30 DIAGNOSIS — M25569 Pain in unspecified knee: Secondary | ICD-10-CM | POA: Diagnosis not present

## 2019-04-30 DIAGNOSIS — M25461 Effusion, right knee: Secondary | ICD-10-CM | POA: Diagnosis not present

## 2019-04-30 DIAGNOSIS — M170 Bilateral primary osteoarthritis of knee: Secondary | ICD-10-CM | POA: Diagnosis not present

## 2019-05-01 ENCOUNTER — Encounter (HOSPITAL_COMMUNITY): Payer: Self-pay

## 2019-05-05 ENCOUNTER — Encounter (HOSPITAL_COMMUNITY): Payer: Self-pay

## 2019-05-07 ENCOUNTER — Encounter (HOSPITAL_COMMUNITY): Payer: Self-pay

## 2019-05-07 ENCOUNTER — Other Ambulatory Visit (HOSPITAL_COMMUNITY): Payer: Self-pay

## 2019-05-07 MED ORDER — FUROSEMIDE 20 MG PO TABS: ORAL_TABLET | ORAL | 3 refills | Status: DC

## 2019-05-08 ENCOUNTER — Encounter (HOSPITAL_COMMUNITY): Payer: Self-pay

## 2019-05-11 DIAGNOSIS — I10 Essential (primary) hypertension: Secondary | ICD-10-CM | POA: Diagnosis not present

## 2019-05-11 DIAGNOSIS — E782 Mixed hyperlipidemia: Secondary | ICD-10-CM | POA: Diagnosis not present

## 2019-05-11 DIAGNOSIS — I2510 Atherosclerotic heart disease of native coronary artery without angina pectoris: Secondary | ICD-10-CM | POA: Diagnosis not present

## 2019-05-12 ENCOUNTER — Encounter (HOSPITAL_COMMUNITY): Payer: Self-pay

## 2019-05-14 ENCOUNTER — Encounter (HOSPITAL_COMMUNITY): Payer: Self-pay

## 2019-05-15 ENCOUNTER — Encounter (HOSPITAL_COMMUNITY): Payer: Self-pay

## 2019-05-18 DIAGNOSIS — I2510 Atherosclerotic heart disease of native coronary artery without angina pectoris: Secondary | ICD-10-CM | POA: Diagnosis not present

## 2019-05-18 DIAGNOSIS — I10 Essential (primary) hypertension: Secondary | ICD-10-CM | POA: Diagnosis not present

## 2019-05-18 DIAGNOSIS — E782 Mixed hyperlipidemia: Secondary | ICD-10-CM | POA: Diagnosis not present

## 2019-05-18 DIAGNOSIS — I130 Hypertensive heart and chronic kidney disease with heart failure and stage 1 through stage 4 chronic kidney disease, or unspecified chronic kidney disease: Secondary | ICD-10-CM | POA: Diagnosis not present

## 2019-05-19 ENCOUNTER — Encounter (HOSPITAL_COMMUNITY): Payer: Self-pay

## 2019-05-20 ENCOUNTER — Ambulatory Visit (INDEPENDENT_AMBULATORY_CARE_PROVIDER_SITE_OTHER): Payer: Medicare Other | Admitting: Podiatry

## 2019-05-20 ENCOUNTER — Other Ambulatory Visit: Payer: Self-pay

## 2019-05-20 ENCOUNTER — Encounter: Payer: Self-pay | Admitting: Podiatry

## 2019-05-20 VITALS — Temp 97.8°F

## 2019-05-20 DIAGNOSIS — D689 Coagulation defect, unspecified: Secondary | ICD-10-CM

## 2019-05-20 DIAGNOSIS — I739 Peripheral vascular disease, unspecified: Secondary | ICD-10-CM

## 2019-05-20 DIAGNOSIS — M79676 Pain in unspecified toe(s): Secondary | ICD-10-CM

## 2019-05-20 DIAGNOSIS — B351 Tinea unguium: Secondary | ICD-10-CM

## 2019-05-20 NOTE — Progress Notes (Signed)
Patient ID: Henry Neal, male   DOB: [birth year], 85 y.o.   MRN: [id number] Complaint:  Visit Type: Patient returns to my office for continued preventative foot care services. Complaint: Patient states" my nails have grown long and thick and become painful to walk and wear shoes"  He presents for preventative foot care services. No changes to ROS.  Patient is taking plavix.  Podiatric Exam: Vascular: dorsalis pedis and posterior tibial pulses are weakly  palpable bilateral. Capillary return is immediate. Temperature gradient is WNL. Skin turgor WNL  Sensorium: Normal Semmes Weinstein monofilament test. Normal tactile sensation bilaterally. Nail Exam: Pt has thick disfigured discolored nails with subungual debris noted bilateral entire nail hallux through fifth toenails Ulcer Exam: There is no evidence of ulcer or pre-ulcerative changes or infection. Orthopedic Exam: Muscle tone and strength are WNL. No limitations in general ROM. No crepitus or effusions noted. Foot type and digits show no abnormalities. Bony prominences are unremarkable. Skin: No Porokeratosis. No infection or ulcers.  Diagnosis:  Tinea unguium, Pain in right toe, pain in left toes,   Treatment & Plan Procedures and Treatment: Consent by patient was obtained for treatment procedures. The patient understood the discussion of treatment and procedures well. All questions were answered thoroughly reviewed. Debridement of mycotic and hypertrophic toenails, 1 through 5 bilateral and clearing of subungual debris. No ulceration, no infection noted.   Return Visit-Office Procedure: Patient instructed to return to the office for a follow up visit 10  weeks  for continued evaluation and treatment.   Gardiner Barefoot DPM

## 2019-05-21 ENCOUNTER — Encounter (HOSPITAL_COMMUNITY): Payer: Self-pay

## 2019-05-22 ENCOUNTER — Encounter (HOSPITAL_COMMUNITY): Payer: Self-pay

## 2019-05-26 ENCOUNTER — Encounter (HOSPITAL_COMMUNITY): Payer: Self-pay

## 2019-05-28 ENCOUNTER — Encounter (HOSPITAL_COMMUNITY): Payer: Self-pay

## 2019-05-29 ENCOUNTER — Encounter (HOSPITAL_COMMUNITY): Payer: Self-pay

## 2019-06-02 ENCOUNTER — Encounter (HOSPITAL_COMMUNITY): Payer: Self-pay

## 2019-06-04 ENCOUNTER — Other Ambulatory Visit: Payer: Self-pay | Admitting: Cardiology

## 2019-06-04 ENCOUNTER — Encounter (HOSPITAL_COMMUNITY): Payer: Self-pay

## 2019-06-05 ENCOUNTER — Encounter (HOSPITAL_COMMUNITY): Payer: Self-pay

## 2019-06-09 ENCOUNTER — Encounter (HOSPITAL_COMMUNITY): Payer: Self-pay

## 2019-06-11 ENCOUNTER — Encounter (HOSPITAL_COMMUNITY): Payer: Self-pay

## 2019-06-12 ENCOUNTER — Encounter (HOSPITAL_COMMUNITY): Payer: Self-pay

## 2019-06-12 ENCOUNTER — Ambulatory Visit (INDEPENDENT_AMBULATORY_CARE_PROVIDER_SITE_OTHER): Payer: Medicare Other | Admitting: *Deleted

## 2019-06-12 DIAGNOSIS — I441 Atrioventricular block, second degree: Secondary | ICD-10-CM

## 2019-06-14 LAB — CUP PACEART REMOTE DEVICE CHECK
Battery Remaining Longevity: 114 mo
Battery Remaining Percentage: 95.5 %
Battery Voltage: 2.98 V
Brady Statistic AP VP Percent: 5.2 %
Brady Statistic AP VS Percent: 1 %
Brady Statistic AS VP Percent: 95 %
Brady Statistic AS VS Percent: 1 %
Brady Statistic RA Percent Paced: 4.9 %
Date Time Interrogation Session: 20200821191015
Implantable Lead Implant Date: 20170406
Implantable Lead Implant Date: 20170406
Implantable Lead Implant Date: 20170406
Implantable Lead Location: 753858
Implantable Lead Location: 753859
Implantable Lead Location: 753860
Implantable Pulse Generator Implant Date: 20170406
Lead Channel Impedance Value: 460 Ohm
Lead Channel Impedance Value: 480 Ohm
Lead Channel Impedance Value: 800 Ohm
Lead Channel Pacing Threshold Amplitude: 0.5 V
Lead Channel Pacing Threshold Amplitude: 1.125 V
Lead Channel Pacing Threshold Amplitude: 1.125 V
Lead Channel Pacing Threshold Pulse Width: 0.4 ms
Lead Channel Pacing Threshold Pulse Width: 0.4 ms
Lead Channel Pacing Threshold Pulse Width: 0.4 ms
Lead Channel Sensing Intrinsic Amplitude: 12 mV
Lead Channel Sensing Intrinsic Amplitude: 2.4 mV
Lead Channel Setting Pacing Amplitude: 2 V
Lead Channel Setting Pacing Amplitude: 2.125
Lead Channel Setting Pacing Amplitude: 2.125
Lead Channel Setting Pacing Pulse Width: 0.4 ms
Lead Channel Setting Pacing Pulse Width: 0.4 ms
Lead Channel Setting Sensing Sensitivity: 2 mV
Pulse Gen Model: 3262
Pulse Gen Serial Number: 7866838

## 2019-06-16 ENCOUNTER — Encounter (HOSPITAL_COMMUNITY): Payer: Self-pay

## 2019-06-18 ENCOUNTER — Encounter (HOSPITAL_COMMUNITY): Payer: Self-pay

## 2019-06-19 ENCOUNTER — Encounter: Payer: Self-pay | Admitting: Cardiology

## 2019-06-19 ENCOUNTER — Encounter (HOSPITAL_COMMUNITY): Payer: Self-pay

## 2019-06-19 NOTE — Progress Notes (Signed)
Remote pacemaker transmission.   

## 2019-07-13 ENCOUNTER — Other Ambulatory Visit (HOSPITAL_COMMUNITY): Payer: Self-pay

## 2019-07-13 MED ORDER — SPIRONOLACTONE 25 MG PO TABS: 12.5000 mg | ORAL_TABLET | Freq: Every day | ORAL | 3 refills | Status: DC

## 2019-07-17 DIAGNOSIS — Z23 Encounter for immunization: Secondary | ICD-10-CM | POA: Diagnosis not present

## 2019-07-21 ENCOUNTER — Ambulatory Visit: Payer: Medicare Other | Admitting: Internal Medicine

## 2019-07-21 ENCOUNTER — Encounter: Payer: Self-pay | Admitting: Internal Medicine

## 2019-07-21 ENCOUNTER — Other Ambulatory Visit: Payer: Self-pay

## 2019-07-21 VITALS — BP 96/50 | HR 63 | Ht 70.0 in | Wt 174.0 lb

## 2019-07-21 DIAGNOSIS — Z950 Presence of cardiac pacemaker: Secondary | ICD-10-CM | POA: Diagnosis not present

## 2019-07-21 DIAGNOSIS — I2510 Atherosclerotic heart disease of native coronary artery without angina pectoris: Secondary | ICD-10-CM

## 2019-07-21 DIAGNOSIS — I5022 Chronic systolic (congestive) heart failure: Secondary | ICD-10-CM

## 2019-07-21 NOTE — Progress Notes (Signed)
HPI Henry Neal returns today for followup. He is a pleasant almost 85 yo man with chronic systolic heart failure and heart block, s/p biv ppm insertion. 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 He denies chest pain or sob. He wears support stockings to the mid thigh. Allergies  Allergen Reactions  . Doxepin Other (See Comments)    hallucinations  hallucinations Hallucinations  hallucinations Hallucinations hallucinations Hallucinations  . Sinequan [Doxepin Hcl] Other (See Comments)    hallucinations  . Verapamil Other (See Comments)    Caused heart to race Caused heart to race  Caused heart to race Caused heart to race  . Procaine Itching and Other (See Comments)    Tingling all over as soon as it was injected  Tingling all over as soon as it was injected Tingling all over as soon as it was injected     Current Outpatient Medications  Medication Sig Dispense Refill  . acetaminophen (TYLENOL) 500 MG tablet Take 1,000 mg by mouth daily as needed for mild pain or headache. Reported on 01/25/2016    . aspirin EC 81 MG tablet Take 81 mg by mouth daily.      Marland Kitchen atorvastatin (LIPITOR) 40 MG tablet Take 1 tablet (40 mg total) by mouth daily at 6 PM. 30 tablet 6  . Calcium Carbonate-Vitamin D (CALCIUM 600 + D PO) Take 1 tablet by mouth 2 (two) times daily.     . carvedilol (COREG) 3.125 MG tablet Take 1 tablet (3.125 mg total) by mouth 2 (two) times daily. 60 tablet 3  . clopidogrel (PLAVIX) 75 MG tablet TAKE 1 TABLET BY MOUTH EVERY DAY WITH BREAKFAST 90 tablet 3  . clotrimazole-betamethasone (LOTRISONE) cream APPLY TO AA BID  1  . ENTRESTO 24-26 MG TAKE 1 TABLET BY MOUTH TWICE DAILY 60 tablet 3  . finasteride (PROSCAR) 5 MG tablet take 1 tablet by mouth once daily    . fluticasone (FLONASE) 50 MCG/ACT nasal spray Place 1 spray into both nostrils daily as needed for allergies.     . furosemide (LASIX) 20 MG tablet TAKE 1 TABLET BY MOUTH ONCE WEEKLY AS  NEEDED FOR WEIGHT 167LBS OR MORE; IF NEEDED MORE THAN ONCE WEEKLY. CALL CHF CLINIC [phone] 15 tablet 3  . gabapentin (NEURONTIN) 300 MG capsule Take 300 mg by mouth 2 (two) times daily.  0  . ketoconazole (NIZORAL) 2 % shampoo APPLY AA ONCE OR TWICE A WEEK TO SCALP AND THEN USE REGULAR SHAMPOO    . meclizine (ANTIVERT) 25 MG tablet Take 1 tablet (25 mg total) by mouth 2 (two) times daily as needed for dizziness. 30 tablet 0  . meloxicam (MOBIC) 15 MG tablet Take 15 mg by mouth daily as needed for pain.  0  . mometasone (ELOCON) 0.1 % cream Apply 1 application topically daily as needed (for irritation).     . Multiple Vitamin (MULTIVITAMIN PO) Take 1 tablet by mouth daily. ALIVE men's vitamin    . nitroGLYCERIN (NITROSTAT) 0.4 MG SL tablet PLACE 1 TABLET UNDER THE TONGUE IF NEEDED EVERY 5 MINUTES FOR CHEST PAIN FOR 3 DOSES IF NO RELIEF AFTER FIRST DOSE CALL PRESCRIBER OR 911. 25 tablet 5  . pantoprazole (PROTONIX) 40 MG tablet Take 40 mg by mouth daily.  0  . polyethylene glycol (MIRALAX / GLYCOLAX) packet Take 17 g by mouth daily.    Marland Kitchen spironolactone (ALDACTONE) 25 MG tablet Take 0.5 tablets (12.5 mg total) by mouth  at bedtime. 45 tablet 3  . tiZANidine (ZANAFLEX) 2 MG tablet Take 2 mg by mouth every 8 (eight) hours as needed. For muscle spasms  0  . triamcinolone cream (KENALOG) 0.1 % Apply 1 application topically 2 (two) times daily as needed (skin).   0   No current facility-administered medications for this visit.      Past Medical History:  Diagnosis Date  . AAA (abdominal aortic aneurysm) ([place])   . Abnormal stress test   . Allergic rhinitis    uses Flonase daily  . Anginal pain ([facility])    pain in neck,shoulders,down arms occ  . Arthritis   . Cancer ([place])    skin  . Carotid artery occlusion   . Chronic venous insufficiency   . Coronary artery disease   . Dizziness    takes Antivert daily as needed  . ED (erectile dysfunction)   . GERD (gastroesophageal reflux disease)     takes Omeprazole daily  . Hiatal hernia   . Hx of echocardiogram 04/04/2010   showed a borderline dilatd left ventricle with mild left ventricle hypertrophy and an EF 50-55% doppler suggested diastolic dysfunction, although the pattern is probably normal for an 85 yrs old. The left atrium was indeed mildly dilated at 42 mm, but there are no significant valvular abnormalities.   . Hyperlipidemia   . Hypertension    takes Proscar,Dyazide,Avapro,and Metoprolol daily  . Myocardial infarction [facility]) Oct. 11, 2016   Heart Attack  . Palpitation   . Shortness of breath   . Sigmoid diverticulosis   . T12 compression fracture ([place])   . Thyroid disease     ROS:   All systems reviewed and negative except as noted in the HPI.   Past Surgical History:  Procedure Laterality Date  . ABDOMINAL AORTIC ANEURYSM REPAIR  02/24/2009   AAA Stenting  . CARDIAC CATHETERIZATION N/A 08/04/2015   Procedure: Right/Left Heart Cath and Coronary Angiography;  Surgeon: Larey Dresser, MD;  Location: [place] CV LAB;  Service: Cardiovascular;  Laterality: N/A;  . CARDIAC CATHETERIZATION N/A 08/04/2015   Procedure: Coronary Stent Intervention;  Surgeon: Burnell Blanks, MD;  Location: [place] CV LAB;  Service: Cardiovascular;  Laterality: N/A;  . CARDIAC CATHETERIZATION N/A 08/08/2015   Procedure: Coronary Stent Intervention;  Surgeon: Peter M Martinique, MD;  Location: [place] CV LAB;  Service: Cardiovascular;  Laterality: N/A;  . ENDARTERECTOMY Right 10/27/2013   Procedure: ENDARTERECTOMY CAROTID;  Surgeon: Elam Dutch, MD;  Location: [place];  Service: Vascular;  Laterality: Right;  . EP IMPLANTABLE DEVICE N/A 01/26/2016   Procedure: BiV Pacemaker Insertion CRT-P;  Surgeon: Evans Lance, MD;  Location: [place] CV LAB;  Service: Cardiovascular;  Laterality: N/A;  . HAND SURGERY Right    tendon, lft 90's  . HERNIA REPAIR Bilateral   . LEFT HEART CATHETERIZATION WITH CORONARY ANGIOGRAM N/A  10/12/2013   Procedure: LEFT HEART CATHETERIZATION WITH CORONARY ANGIOGRAM;  Surgeon: Lorretta Harp, MD;  Location: [facility] CATH LAB;  Service: Cardiovascular;  Laterality: N/A;  . melanoma removed    . ROTATOR CUFF REPAIR  2003   left arm     Family History  Problem Relation Age of Onset  . Heart disease Mother        before age 69  . COPD Mother   . Diabetes Mother   . Hyperlipidemia Mother   . Hypertension Mother      Social History   Socioeconomic History  . Marital  status: Widowed    Spouse name: Not on file  . Number of children: Not on file  . Years of education: Not on file  . Highest education level: Not on file  Occupational History  . Not on file  Social Needs  . Financial resource strain: Not on file  . Food insecurity    Worry: Not on file    Inability: Not on file  . Transportation needs    Medical: Not on file    Non-medical: Not on file  Tobacco Use  . Smoking status: Former Smoker    Packs/day: 2.00    Years: 30.00    Pack years: 60.00    Types: Cigarettes    Quit date: 10/22/1977    Years since quitting: 41.7  . Smokeless tobacco: Former Systems developer    Quit date: 10/22/1984  Substance and Sexual Activity  . Alcohol use: Yes    Alcohol/week: 7.0 standard drinks    Types: 7 Cans of beer per week  . Drug use: No  . Sexual activity: Not on file  Lifestyle  . Physical activity    Days per week: Not on file    Minutes per session: Not on file  . Stress: Not on file  Relationships  . Social Herbalist on phone: Not on file    Gets together: Not on file    Attends religious service: Not on file    Active member of club or organization: Not on file    Attends meetings of clubs or organizations: Not on file    Relationship status: Not on file  . Intimate partner violence    Fear of current or ex partner: Not on file    Emotionally abused: Not on file    Physically abused: Not on file    Forced sexual activity: Not on file  Other Topics  Concern  . Not on file  Social History Narrative  . Not on file     BP (!) 96/50   Pulse 63   Ht 5\' 10"  (1.778 m)   Wt 174 lb (78.9 kg)   SpO2 98%   BMI 24.97 kg/m   Physical Exam:  Well appearing elderly man who looks older than his stated age, NAD HEENT: Unremarkable Neck:  No JVD, no thyromegally Lymphatics:  No adenopathy Back:  No CVA tenderness Lungs:  Clear with no wheezes HEART:  Regular rate rhythm, no murmurs, no rubs, no clicks Abd:  soft, positive bowel sounds, no organomegally, no rebound, no guarding Ext:  2 plus pulses, no edema, no cyanosis, no clubbing Skin:  No rashes no nodules Neuro:  CN II through XII intact, motor grossly intact   DEVICE  Normal device function.  See PaceArt for details.   Assess/Plan: 1. Chronic systolic heart failure - his symptoms remain class 2. He will continue his current meds. 2. BIV PM - his St. Jude Biv PPM is working normally. We will recheck in several months 3. CAD - he denies any anginal symptoms. No change in his meds. He is encouraged to remain active.  Henry Neal.D.

## 2019-07-21 NOTE — Patient Instructions (Signed)
Medication Instructions:  Your physician recommends that you continue on your current medications as directed. Please refer to the Current Medication list given to you today.  Labwork: None ordered.  Testing/Procedures: None ordered.  Follow-Up: Your physician wants you to follow-up in: one year with Dr. Lovena Le.   You will receive a reminder letter in the mail two months in advance. If you don't receive a letter, please call our office to schedule the follow-up appointment.  Remote monitoring is used to monitor your Pacemaker from home. This monitoring reduces the number of office visits required to check your device to one time per year. It allows [place] to keep an eye on the functioning of your device to ensure it is working properly. You are scheduled for a device check from home on 09/11/2019. You may send your transmission at any time that day. If you have a wireless device, the transmission will be sent automatically. After your physician reviews your transmission, you will receive a postcard with your next transmission date.  Any Other Special Instructions Will Be Listed Below (If Applicable).  If you need a refill on your cardiac medications before your next appointment, please call your pharmacy.

## 2019-08-04 ENCOUNTER — Encounter (HOSPITAL_COMMUNITY)
Admission: RE | Admit: 2019-08-04 | Discharge: 2019-08-04 | Disposition: A | Discharge status: Home / Self Care | Payer: Self-pay | Source: Ambulatory Visit | Attending: Cardiology | Admitting: Cardiology

## 2019-08-04 ENCOUNTER — Other Ambulatory Visit: Payer: Self-pay

## 2019-08-04 DIAGNOSIS — I5022 Chronic systolic (congestive) heart failure: Secondary | ICD-10-CM | POA: Insufficient documentation

## 2019-08-04 NOTE — Progress Notes (Signed)
Henry Neal presented today for their first day of exercise at cardiac rehab maintenance.   Entrance Vitals Blood Pressure: 118/60  Additional Comments: Patient tolerated first session of exercise well without symptoms.  Sol Passer, MS, ACSM CEP

## 2019-08-06 ENCOUNTER — Other Ambulatory Visit: Payer: Self-pay

## 2019-08-06 ENCOUNTER — Encounter (HOSPITAL_COMMUNITY)
Admission: RE | Admit: 2019-08-06 | Discharge: 2019-08-06 | Disposition: A | Discharge status: Home / Self Care | Payer: Self-pay | Source: Ambulatory Visit | Attending: Cardiology | Admitting: Cardiology

## 2019-08-07 ENCOUNTER — Ambulatory Visit (INDEPENDENT_AMBULATORY_CARE_PROVIDER_SITE_OTHER): Payer: Medicare Other | Admitting: Podiatry

## 2019-08-07 ENCOUNTER — Encounter: Payer: Self-pay | Admitting: Podiatry

## 2019-08-07 ENCOUNTER — Other Ambulatory Visit: Payer: Self-pay

## 2019-08-07 DIAGNOSIS — I739 Peripheral vascular disease, unspecified: Secondary | ICD-10-CM

## 2019-08-07 DIAGNOSIS — D689 Coagulation defect, unspecified: Secondary | ICD-10-CM

## 2019-08-07 DIAGNOSIS — M79676 Pain in unspecified toe(s): Secondary | ICD-10-CM

## 2019-08-07 DIAGNOSIS — B351 Tinea unguium: Secondary | ICD-10-CM

## 2019-08-07 NOTE — Progress Notes (Signed)
Patient ID: Henry Neal, male   DOB: 04/16/1927, 85 y.o.   MRN: [id number] Complaint:  Visit Type: Patient returns to my office for continued preventative foot care services. Complaint: Patient states" my nails have grown long and thick and become painful to walk and wear shoes"  He presents for preventative foot care services. No changes to ROS.  Patient is taking plavix.  Podiatric Exam: Vascular: dorsalis pedis and posterior tibial pulses are weakly  palpable bilateral. Capillary return is immediate. Temperature gradient is WNL. Skin turgor WNL  Sensorium: Normal Semmes Weinstein monofilament test. Normal tactile sensation bilaterally. Nail Exam: Pt has thick disfigured discolored nails with subungual debris noted bilateral entire nail hallux through fifth toenails Ulcer Exam: There is no evidence of ulcer or pre-ulcerative changes or infection. Orthopedic Exam: Muscle tone and strength are WNL. No limitations in general ROM. No crepitus or effusions noted. Foot type and digits show no abnormalities. Bony prominences are unremarkable. Skin: No Porokeratosis. No infection or ulcers.  Diagnosis:  Tinea unguium, Pain in right toe, pain in left toes,   Treatment & Plan Procedures and Treatment: Consent by patient was obtained for treatment procedures. The patient understood the discussion of treatment and procedures well. All questions were answered thoroughly reviewed. Debridement of mycotic and hypertrophic toenails, 1 through 5 bilateral and clearing of subungual debris. No ulceration, no infection noted.   Return Visit-Office Procedure: Patient instructed to return to the office for a follow up visit 10  weeks  for continued evaluation and treatment.   Jaclyne Haverstick DPM 

## 2019-08-11 ENCOUNTER — Other Ambulatory Visit: Payer: Self-pay

## 2019-08-11 ENCOUNTER — Encounter (HOSPITAL_COMMUNITY)
Admission: RE | Admit: 2019-08-11 | Discharge: 2019-08-11 | Disposition: A | Discharge status: Home / Self Care | Payer: Self-pay | Source: Ambulatory Visit | Attending: Cardiology | Admitting: Cardiology

## 2019-08-13 ENCOUNTER — Encounter (HOSPITAL_COMMUNITY)
Admission: RE | Admit: 2019-08-13 | Discharge: 2019-08-13 | Disposition: A | Discharge status: Home / Self Care | Payer: Self-pay | Source: Ambulatory Visit | Attending: Cardiology | Admitting: Cardiology

## 2019-08-13 ENCOUNTER — Other Ambulatory Visit: Payer: Self-pay

## 2019-08-13 DIAGNOSIS — M170 Bilateral primary osteoarthritis of knee: Secondary | ICD-10-CM | POA: Diagnosis not present

## 2019-08-13 DIAGNOSIS — M159 Polyosteoarthritis, unspecified: Secondary | ICD-10-CM | POA: Diagnosis not present

## 2019-08-13 DIAGNOSIS — M25569 Pain in unspecified knee: Secondary | ICD-10-CM | POA: Diagnosis not present

## 2019-08-13 DIAGNOSIS — M25461 Effusion, right knee: Secondary | ICD-10-CM | POA: Diagnosis not present

## 2019-08-14 ENCOUNTER — Telehealth (HOSPITAL_COMMUNITY): Payer: Self-pay | Admitting: Surgery

## 2019-08-14 ENCOUNTER — Encounter (INDEPENDENT_AMBULATORY_CARE_PROVIDER_SITE_OTHER): Payer: Self-pay

## 2019-08-14 NOTE — Telephone Encounter (Signed)
[name] actually came into [facility] concerned about his BP.  He said it had been "high" at home.  I checked it manually and BP was 142/70.  He said that was in line with home readings.  He has no other complaints today.  I asked that he call back with any concerns or questions.  He has an appt next Friday 08/21/19 with Dr. Aundra Dubin.

## 2019-08-18 ENCOUNTER — Other Ambulatory Visit: Payer: Self-pay

## 2019-08-18 ENCOUNTER — Encounter (HOSPITAL_COMMUNITY)
Admission: RE | Admit: 2019-08-18 | Discharge: 2019-08-18 | Disposition: A | Discharge status: Home / Self Care | Payer: Self-pay | Source: Ambulatory Visit | Attending: Cardiology | Admitting: Cardiology

## 2019-08-20 ENCOUNTER — Encounter (HOSPITAL_COMMUNITY)
Admission: RE | Admit: 2019-08-20 | Discharge: 2019-08-20 | Disposition: A | Discharge status: Home / Self Care | Payer: Self-pay | Source: Ambulatory Visit | Attending: Cardiology | Admitting: Cardiology

## 2019-08-20 ENCOUNTER — Other Ambulatory Visit: Payer: Self-pay

## 2019-08-21 ENCOUNTER — Encounter (HOSPITAL_COMMUNITY): Payer: Self-pay | Admitting: Cardiology

## 2019-08-21 ENCOUNTER — Ambulatory Visit (HOSPITAL_COMMUNITY)
Admission: RE | Admit: 2019-08-21 | Discharge: 2019-08-21 | Disposition: A | Discharge status: Home / Self Care | Payer: Medicare Other | Source: Ambulatory Visit | Attending: Cardiology | Admitting: Cardiology

## 2019-08-21 VITALS — BP 123/58 | HR 59 | Wt 174.6 lb

## 2019-08-21 DIAGNOSIS — I2510 Atherosclerotic heart disease of native coronary artery without angina pectoris: Secondary | ICD-10-CM | POA: Insufficient documentation

## 2019-08-21 DIAGNOSIS — I2582 Chronic total occlusion of coronary artery: Secondary | ICD-10-CM | POA: Insufficient documentation

## 2019-08-21 DIAGNOSIS — I252 Old myocardial infarction: Secondary | ICD-10-CM | POA: Diagnosis not present

## 2019-08-21 DIAGNOSIS — Z87891 Personal history of nicotine dependence: Secondary | ICD-10-CM | POA: Diagnosis not present

## 2019-08-21 DIAGNOSIS — I255 Ischemic cardiomyopathy: Secondary | ICD-10-CM | POA: Diagnosis not present

## 2019-08-21 DIAGNOSIS — I5022 Chronic systolic (congestive) heart failure: Secondary | ICD-10-CM | POA: Diagnosis not present

## 2019-08-21 DIAGNOSIS — Z7902 Long term (current) use of antithrombotics/antiplatelets: Secondary | ICD-10-CM | POA: Insufficient documentation

## 2019-08-21 DIAGNOSIS — I447 Left bundle-branch block, unspecified: Secondary | ICD-10-CM | POA: Diagnosis not present

## 2019-08-21 DIAGNOSIS — Z955 Presence of coronary angioplasty implant and graft: Secondary | ICD-10-CM | POA: Insufficient documentation

## 2019-08-21 DIAGNOSIS — I5042 Chronic combined systolic (congestive) and diastolic (congestive) heart failure: Secondary | ICD-10-CM | POA: Insufficient documentation

## 2019-08-21 DIAGNOSIS — N189 Chronic kidney disease, unspecified: Secondary | ICD-10-CM | POA: Diagnosis not present

## 2019-08-21 DIAGNOSIS — Z79899 Other long term (current) drug therapy: Secondary | ICD-10-CM | POA: Insufficient documentation

## 2019-08-21 DIAGNOSIS — I6521 Occlusion and stenosis of right carotid artery: Secondary | ICD-10-CM | POA: Insufficient documentation

## 2019-08-21 DIAGNOSIS — Z8249 Family history of ischemic heart disease and other diseases of the circulatory system: Secondary | ICD-10-CM | POA: Diagnosis not present

## 2019-08-21 DIAGNOSIS — Z7982 Long term (current) use of aspirin: Secondary | ICD-10-CM | POA: Diagnosis not present

## 2019-08-21 LAB — BASIC METABOLIC PANEL
Anion gap: 10 (ref 5–15)
BUN: 14 mg/dL (ref 8–23)
CO2: 24 mmol/L (ref 22–32)
Calcium: 9 mg/dL (ref 8.9–10.3)
Chloride: 103 mmol/L (ref 98–111)
Creatinine, Ser: 0.89 mg/dL (ref 0.61–1.24)
GFR calc Af Amer: >60 mL/min (ref >60)
GFR calc non Af Amer: >60 mL/min (ref >60)
Glucose, Bld: 104 mg/dL — ABNORMAL HIGH (ref 70–99)
Potassium: 3.8 mmol/L (ref 3.5–5.1)
Sodium: 137 mmol/L (ref 135–145)

## 2019-08-21 LAB — LIPID PANEL
Cholesterol: 111 mg/dL (ref 0–200)
HDL: 53 mg/dL (ref >40)
LDL Cholesterol: 46 mg/dL (ref 0–99)
Total CHOL/HDL Ratio: 2.1 RATIO
Triglycerides: 61 mg/dL (ref <150)
VLDL: 12 mg/dL (ref 0–40)

## 2019-08-21 MED ORDER — SPIRONOLACTONE 25 MG PO TABS: 25.0000 mg | ORAL_TABLET | Freq: Every day | ORAL | 3 refills | Status: DC

## 2019-08-21 NOTE — Patient Instructions (Signed)
INCREASE Spironolactone to 25mg  (1 tab) daily  Labs today and repeat in 10 days We will only contact you if something comes back abnormal or we need to make some changes. Otherwise no news is good news!  Your physician recommends that you schedule a follow-up appointment in: 4 months with Dr Aundra Dubin  At the [facility], you and your health needs are our priority. As part of our continuing mission to provide you with exceptional heart care, we have created designated Provider Care Teams. These Care Teams include your primary Cardiologist (physician) and Advanced Practice Providers (APPs- Physician Assistants and Nurse Practitioners) who all work together to provide you with the care you need, when you need it.   You may see any of the following providers on your designated Care Team at your next follow up: Marland Kitchen Dr Glori Bickers . Dr Loralie Champagne . Darrick Grinder, NP . Lyda Jester, PA   Please be sure to bring in all your medications bottles to every appointment.

## 2019-08-23 NOTE — Progress Notes (Signed)
Patient ID: Henry Neal, male   DOB: 1927/06/02, 85 y.o.   MRN: [id number]      Advanced Heart Failure Clinic Note  PCP: Dr. Ashby Dawes Cardiology: Dr. Thresa Ross is a 85 y.o. male with history of CAD, chronic systolic CHF, LBBB, AAA s/p repair presents for followup of CHF and CAD.  He had a cardiac cath in 12/15, showing total occlusion of the LAD and 50-60% proximal RCA.  EF at that time was near-normal.  In 10/16, he developed dyspnea and epigastric pain.  He was noted to be in acute pulmonary edema and was diuresed.  Troponin was only mildly elevated at 0.47.  He ultimately underwent LHC showing hazy 80-90% RCA stenosis which was likely the culprit for his presentation.  Additionally, the LAD was only subtotally occluded on this cath.  He had DES to RCA.  Cardiac MRI was done, showing EF 22% with significant viability in LAD territory.  Therefore, CTO of LAD was opened with DES.  Echo in 2/17 showed EF persistently low at 15%.  He had St Jude CRT-P placement in 4/17. Echo in 1/19 showed EF up to 45-50% with mild MR.    Echo in 3/20 showed EF 45% with mild diffuse hypokinesis, mildly decreased RV systolic function.   Still works as a Furniture conservator/restorer.  Weight is down 3 lbs. No significant exertional dyspnea.  Ambulation is limited by knee pain, recently had fluid drawn out without evidence for gout.  He also has ankle pain and has seen a rheumatologist and has been started on prednisone.  Mild atypical chest pain, rare.  He has been going to maintenance cardiac rehab regularly.    St Jude device interrogation: >99% BiV pacing, no atrial fibrillation, thoracic impedance stable.   Labs (10/16): hgb 9.7, K 4.6, creatinine 1.12 Labs (11/16): K 5.9, creatinine 1.5 Labs (12/16); K 4.2, creatinine 1.07 => 1.02, BNP 448 Labs (1/17): K 4.1, creatinine 1.13, BNP 423, LDL 40, HDL 47 Labs (4/17): K 3.9, creatinine 1.03, HCT 32.6 Labs (5/17): K 4.4, creatinine 1.09 Labs (1/18): LDL 37 Labs  (01/25/2017): K 3.5 Creatinine 0.96  Labs (8/18): BNP 30, K 3.7, creatinine 1.0 Labs (11/18): LDL 40, HDL 47, K 3.5, creatinine 0.96  Labs (3/19): K 4, creatinine 0.97 Labs (11/19): K 3.9, creatinine 1.2, LDL 44 Labs (4/20): K 4.1, creatinine 1.11  ECG (personally reviewed): NSR, BiV pacing  PMH: 1. CKD 2. AAA s/p repair, followed at [facility]. 3. Carotid stenosis: s/p right CEA.   Carotid dopplers (2/16) with right CEA stable, < [id number] LICA stenosis.  Followed at [facility].  - Carotid dopplers ([id number]): [id number] LICA, patent right CEA.  4. Chronic LBBB 5. LAD: LHC (12/15) with totally occluded LAD, 50-60% pRCA => medically managed.  10/16 admitted with NSTEMI.  LHC with subtotalled LAD occlusion, 80-90% pRCA with thrombus => DES RCA initially.  Cardiac MRI was done showing viability in the LAD territory, so he had DES to the CTO LAD.   6. Chronic systolic CHF: Ischemic cardiomyopathy.  Echo (10/16) with EF 15% with wall motion abnormalities, moderately dilated RV with normal systolic function.  CMRI (10/16) with severe LV dilation, EF 22% with septal-lateral dyssynchrony, no LV thrombus, normal RV size/systolic function => significant viability noted in the LAD territory.  Echo (2/17): EF 15%, severe LV dilation, wall motion abnormalities noted, mild MR, mildly dilated RV with normal systolic function, PASP 55 mmHg.  St Jude CRT-P 4/17.  - Echo (1/18): EF 50-55%, mild  to moderate MR.  - Echo (1/19): EF 45-50%, mild LVH, inferior hypokinesis, mild MR, PASP 30 mmHg.  - Echo (3/20): EF 45%, diffuse hypokinesis, normal RV size with mildly decreased systolic function.  7. ABIs (10/16) were normal.  8. Raynaud's syndrome.  9. Sciatica  SH: Lives alone, prior smoker, still working as a Furniture conservator/restorer.  FH: CAD  Review of systems complete and found to be negative unless listed in HPI.    Current Outpatient Medications  Medication Sig Dispense Refill  . acetaminophen (TYLENOL) 500 MG tablet Take 1,000 mg by mouth daily  as needed for mild pain or headache. Reported on 01/25/2016    . aspirin EC 81 MG tablet Take 81 mg by mouth daily.      Marland Kitchen atorvastatin (LIPITOR) 40 MG tablet Take 1 tablet (40 mg total) by mouth daily at 6 PM. 30 tablet 6  . Calcium Carbonate-Vitamin D (CALCIUM 600 + D PO) Take 1 tablet by mouth 2 (two) times daily.     . carvedilol (COREG) 3.125 MG tablet Take 1 tablet (3.125 mg total) by mouth 2 (two) times daily. 60 tablet 3  . clopidogrel (PLAVIX) 75 MG tablet TAKE 1 TABLET BY MOUTH EVERY DAY WITH BREAKFAST 90 tablet 3  . clotrimazole-betamethasone (LOTRISONE) cream APPLY TO AA BID  1  . ENTRESTO 24-26 MG TAKE 1 TABLET BY MOUTH TWICE DAILY 60 tablet 3  . finasteride (PROSCAR) 5 MG tablet take 1 tablet by mouth once daily    . fluticasone (FLONASE) 50 MCG/ACT nasal spray Place 1 spray into both nostrils daily as needed for allergies.     . furosemide (LASIX) 20 MG tablet TAKE 1 TABLET BY MOUTH ONCE WEEKLY AS NEEDED FOR WEIGHT 167LBS OR MORE; IF NEEDED MORE THAN ONCE WEEKLY. CALL CHF CLINIC [phone] 15 tablet 3  . gabapentin (NEURONTIN) 300 MG capsule Take 300 mg by mouth 2 (two) times daily.  0  . ketoconazole (NIZORAL) 2 % shampoo APPLY AA ONCE OR TWICE A WEEK TO SCALP AND THEN USE REGULAR SHAMPOO    . meclizine (ANTIVERT) 25 MG tablet Take 1 tablet (25 mg total) by mouth 2 (two) times daily as needed for dizziness. 30 tablet 0  . meloxicam (MOBIC) 15 MG tablet Take 15 mg by mouth daily as needed for pain.  0  . mometasone (ELOCON) 0.1 % cream Apply 1 application topically daily as needed (for irritation).     . Multiple Vitamin (MULTIVITAMIN PO) Take 1 tablet by mouth daily. ALIVE men's vitamin    . nitroGLYCERIN (NITROSTAT) 0.4 MG SL tablet PLACE 1 TABLET UNDER THE TONGUE IF NEEDED EVERY 5 MINUTES FOR CHEST PAIN FOR 3 DOSES IF NO RELIEF AFTER FIRST DOSE CALL PRESCRIBER OR 911. 25 tablet 5  . pantoprazole (PROTONIX) 40 MG tablet Take 40 mg by mouth daily.  0  . polyethylene glycol (MIRALAX  / GLYCOLAX) packet Take 17 g by mouth daily.    Marland Kitchen spironolactone (ALDACTONE) 25 MG tablet Take 1 tablet (25 mg total) by mouth daily. 90 tablet 3  . tiZANidine (ZANAFLEX) 2 MG tablet Take 2 mg by mouth every 8 (eight) hours as needed. For muscle spasms  0  . triamcinolone cream (KENALOG) 0.1 % Apply 1 application topically 2 (two) times daily as needed (skin).   0   No current facility-administered medications for this encounter.    BP (!) 123/58   Pulse (!) 59   Wt 79.2 kg (174 lb 9.6 oz)   SpO2 98%  BMI 25.05 kg/m   Wt Readings from Last 3 Encounters:  08/21/19 79.2 kg (174 lb 9.6 oz)  07/21/19 78.9 kg (174 lb)  01/05/19 80.3 kg (177 lb)    General: NAD Neck: No JVD, no thyromegaly or thyroid nodule.  Lungs: Clear to auscultation bilaterally with normal respiratory effort. CV: Nondisplaced PMI.  Heart regular S1/S2, no S3/S4, no murmur.  No peripheral edema.  No carotid bruit.  Normal pedal pulses.  Abdomen: Soft, nontender, no hepatosplenomegaly, no distention.  Skin: Intact without lesions or rashes.  Neurologic: Alert and oriented x 3.  Psych: Normal affect. Extremities: No clubbing or cyanosis.  HEENT: Normal.   Assessment/Plan: 1. CAD: NSTEMI with DES to RCA (culprit vessel) and subsequent DES to CTO of LAD (cMRI showed viability in the LAD distribution) in 10/16. No chest pain.   - Continue ASA 81 and Plavix 75.  - atorvastatin 40 daily, check lipids today.     2. Chronic systolic/diastolic heart failure: Ischemic cardiomyopathy. Echo was done today and reviewed, EF 45%, improved since getting CRT.  St Jude CRT-D.  NYHA class II.  Volume status looks ok on exam.    - Continue to use Lasix prn.   - Continue Coreg 3.125 mg bid.  - Increase spironolactone to 25 mg daily with BMET in 10 days.  - Continue Entresto 24/26 mg BID.  - Continue Cardiac Rehab maintenance program.  3. Carotid stenosis:  - Follows at [facility].  Followup in 4 months  Loralie Champagne, MD  08/23/2019

## 2019-08-25 ENCOUNTER — Encounter (HOSPITAL_COMMUNITY)
Admission: RE | Admit: 2019-08-25 | Discharge: 2019-08-25 | Disposition: A | Discharge status: Home / Self Care | Payer: Self-pay | Source: Ambulatory Visit | Attending: Cardiology | Admitting: Cardiology

## 2019-08-25 ENCOUNTER — Other Ambulatory Visit (HOSPITAL_COMMUNITY): Payer: Self-pay

## 2019-08-25 ENCOUNTER — Other Ambulatory Visit: Payer: Self-pay

## 2019-08-25 DIAGNOSIS — I5022 Chronic systolic (congestive) heart failure: Secondary | ICD-10-CM | POA: Insufficient documentation

## 2019-08-25 MED ORDER — ENTRESTO 24-26 MG PO TABS: 1.0000 | ORAL_TABLET | Freq: Two times a day (BID) | ORAL | 3 refills | Status: DC

## 2019-08-25 MED ORDER — CARVEDILOL 3.125 MG PO TABS: 3.1250 mg | ORAL_TABLET | Freq: Two times a day (BID) | ORAL | 3 refills | Status: DC

## 2019-08-27 ENCOUNTER — Encounter (HOSPITAL_COMMUNITY)
Admission: RE | Admit: 2019-08-27 | Discharge: 2019-08-27 | Disposition: A | Discharge status: Home / Self Care | Payer: Self-pay | Source: Ambulatory Visit | Attending: Cardiology | Admitting: Cardiology

## 2019-08-27 ENCOUNTER — Other Ambulatory Visit: Payer: Self-pay

## 2019-08-31 ENCOUNTER — Other Ambulatory Visit: Payer: Self-pay

## 2019-08-31 ENCOUNTER — Ambulatory Visit (HOSPITAL_COMMUNITY)
Admission: RE | Admit: 2019-08-31 | Discharge: 2019-08-31 | Disposition: A | Discharge status: Home / Self Care | Payer: Medicare Other | Source: Ambulatory Visit | Attending: Cardiology | Admitting: Cardiology

## 2019-08-31 DIAGNOSIS — I5022 Chronic systolic (congestive) heart failure: Secondary | ICD-10-CM | POA: Diagnosis not present

## 2019-08-31 LAB — BASIC METABOLIC PANEL
Anion gap: 10 (ref 5–15)
BUN: 16 mg/dL (ref 8–23)
CO2: 24 mmol/L (ref 22–32)
Calcium: 9.3 mg/dL (ref 8.9–10.3)
Chloride: 101 mmol/L (ref 98–111)
Creatinine, Ser: 1.05 mg/dL (ref 0.61–1.24)
GFR calc Af Amer: >60 mL/min (ref >60)
GFR calc non Af Amer: >60 mL/min (ref >60)
Glucose, Bld: 103 mg/dL — ABNORMAL HIGH (ref 70–99)
Potassium: 4.4 mmol/L (ref 3.5–5.1)
Sodium: 135 mmol/L (ref 135–145)

## 2019-09-01 ENCOUNTER — Encounter (HOSPITAL_COMMUNITY)
Admission: RE | Admit: 2019-09-01 | Discharge: 2019-09-01 | Disposition: A | Discharge status: Home / Self Care | Payer: Self-pay | Source: Ambulatory Visit | Attending: Cardiology | Admitting: Cardiology

## 2019-09-03 ENCOUNTER — Other Ambulatory Visit: Payer: Self-pay

## 2019-09-03 ENCOUNTER — Encounter (HOSPITAL_COMMUNITY)
Admission: RE | Admit: 2019-09-03 | Discharge: 2019-09-03 | Disposition: A | Discharge status: Home / Self Care | Payer: Self-pay | Source: Ambulatory Visit | Attending: Cardiology | Admitting: Cardiology

## 2019-09-03 DIAGNOSIS — R319 Hematuria, unspecified: Secondary | ICD-10-CM | POA: Diagnosis not present

## 2019-09-08 ENCOUNTER — Encounter (HOSPITAL_COMMUNITY)
Admission: RE | Admit: 2019-09-08 | Discharge: 2019-09-08 | Disposition: A | Discharge status: Home / Self Care | Payer: Self-pay | Source: Ambulatory Visit | Attending: Cardiology | Admitting: Cardiology

## 2019-09-08 ENCOUNTER — Other Ambulatory Visit: Payer: Self-pay

## 2019-09-10 ENCOUNTER — Other Ambulatory Visit: Payer: Self-pay

## 2019-09-10 ENCOUNTER — Encounter (HOSPITAL_COMMUNITY)
Admission: RE | Admit: 2019-09-10 | Discharge: 2019-09-10 | Disposition: A | Discharge status: Home / Self Care | Payer: Self-pay | Source: Ambulatory Visit | Attending: Cardiology | Admitting: Cardiology

## 2019-09-11 ENCOUNTER — Ambulatory Visit (INDEPENDENT_AMBULATORY_CARE_PROVIDER_SITE_OTHER): Payer: Medicare Other | Admitting: *Deleted

## 2019-09-11 DIAGNOSIS — I441 Atrioventricular block, second degree: Secondary | ICD-10-CM

## 2019-09-11 LAB — CUP PACEART REMOTE DEVICE CHECK
Battery Remaining Longevity: 117 mo
Battery Remaining Percentage: 95.5 %
Battery Voltage: 2.98 V
Brady Statistic AP VP Percent: 6.5 %
Brady Statistic AP VS Percent: 1 %
Brady Statistic AS VP Percent: 93 %
Brady Statistic AS VS Percent: 1 %
Brady Statistic RA Percent Paced: 6.1 %
Date Time Interrogation Session: 20201120101451
Implantable Lead Implant Date: 20170406
Implantable Lead Implant Date: 20170406
Implantable Lead Implant Date: 20170406
Implantable Lead Location: 753858
Implantable Lead Location: 753859
Implantable Lead Location: 753860
Implantable Pulse Generator Implant Date: 20170406
Lead Channel Impedance Value: 510 Ohm
Lead Channel Impedance Value: 510 Ohm
Lead Channel Impedance Value: 860 Ohm
Lead Channel Pacing Threshold Amplitude: 0.5 V
Lead Channel Pacing Threshold Amplitude: 1 V
Lead Channel Pacing Threshold Amplitude: 1.25 V
Lead Channel Pacing Threshold Pulse Width: 0.4 ms
Lead Channel Pacing Threshold Pulse Width: 0.4 ms
Lead Channel Pacing Threshold Pulse Width: 0.4 ms
Lead Channel Sensing Intrinsic Amplitude: 12 mV
Lead Channel Sensing Intrinsic Amplitude: 2.5 mV
Lead Channel Setting Pacing Amplitude: 2 V
Lead Channel Setting Pacing Amplitude: 2 V
Lead Channel Setting Pacing Amplitude: 2.25 V
Lead Channel Setting Pacing Pulse Width: 0.4 ms
Lead Channel Setting Pacing Pulse Width: 0.4 ms
Lead Channel Setting Sensing Sensitivity: 2 mV
Pulse Gen Model: 3262
Pulse Gen Serial Number: 7866838

## 2019-09-15 ENCOUNTER — Encounter (HOSPITAL_COMMUNITY)
Admission: RE | Admit: 2019-09-15 | Discharge: 2019-09-15 | Disposition: A | Discharge status: Home / Self Care | Payer: Self-pay | Source: Ambulatory Visit | Attending: Cardiology | Admitting: Cardiology

## 2019-09-15 ENCOUNTER — Other Ambulatory Visit: Payer: Self-pay

## 2019-09-22 ENCOUNTER — Encounter (HOSPITAL_COMMUNITY): Payer: Self-pay

## 2019-09-22 DIAGNOSIS — I5022 Chronic systolic (congestive) heart failure: Secondary | ICD-10-CM | POA: Insufficient documentation

## 2019-09-24 ENCOUNTER — Other Ambulatory Visit: Payer: Self-pay

## 2019-09-24 ENCOUNTER — Encounter (HOSPITAL_COMMUNITY)
Admission: RE | Admit: 2019-09-24 | Discharge: 2019-09-24 | Disposition: A | Discharge status: Home / Self Care | Payer: Self-pay | Source: Ambulatory Visit | Attending: Cardiology | Admitting: Cardiology

## 2019-09-29 ENCOUNTER — Other Ambulatory Visit: Payer: Self-pay

## 2019-09-29 ENCOUNTER — Encounter (HOSPITAL_COMMUNITY)
Admission: RE | Admit: 2019-09-29 | Discharge: 2019-09-29 | Disposition: A | Discharge status: Home / Self Care | Payer: Self-pay | Source: Ambulatory Visit | Attending: Cardiology | Admitting: Cardiology

## 2019-10-01 ENCOUNTER — Other Ambulatory Visit: Payer: Self-pay

## 2019-10-01 ENCOUNTER — Encounter (HOSPITAL_COMMUNITY)
Admission: RE | Admit: 2019-10-01 | Discharge: 2019-10-01 | Disposition: A | Discharge status: Home / Self Care | Payer: Medicare Other | Source: Ambulatory Visit | Attending: Cardiology | Admitting: Cardiology

## 2019-10-03 ENCOUNTER — Encounter: Payer: Self-pay | Admitting: Cardiology

## 2019-10-03 NOTE — Progress Notes (Signed)
Remote pacemaker transmission.   

## 2019-10-06 ENCOUNTER — Encounter (HOSPITAL_COMMUNITY)
Admission: RE | Admit: 2019-10-06 | Discharge: 2019-10-06 | Disposition: A | Discharge status: Home / Self Care | Payer: Self-pay | Source: Ambulatory Visit | Attending: Cardiology | Admitting: Cardiology

## 2019-10-06 ENCOUNTER — Other Ambulatory Visit: Payer: Self-pay

## 2019-10-08 ENCOUNTER — Other Ambulatory Visit: Payer: Self-pay

## 2019-10-08 ENCOUNTER — Encounter (HOSPITAL_COMMUNITY)
Admission: RE | Admit: 2019-10-08 | Discharge: 2019-10-08 | Disposition: A | Discharge status: Home / Self Care | Payer: Self-pay | Source: Ambulatory Visit | Attending: Cardiology | Admitting: Cardiology

## 2019-10-13 ENCOUNTER — Other Ambulatory Visit: Payer: Self-pay

## 2019-10-13 ENCOUNTER — Encounter (HOSPITAL_COMMUNITY)
Admission: RE | Admit: 2019-10-13 | Discharge: 2019-10-13 | Disposition: A | Discharge status: Home / Self Care | Payer: Self-pay | Source: Ambulatory Visit | Attending: Cardiology | Admitting: Cardiology

## 2019-10-15 ENCOUNTER — Encounter (HOSPITAL_COMMUNITY)
Admission: RE | Admit: 2019-10-15 | Discharge: 2019-10-15 | Disposition: A | Discharge status: Home / Self Care | Payer: Medicare Other | Source: Ambulatory Visit | Attending: Cardiology | Admitting: Cardiology

## 2019-10-15 ENCOUNTER — Other Ambulatory Visit: Payer: Self-pay

## 2019-10-20 ENCOUNTER — Encounter (HOSPITAL_COMMUNITY): Payer: Self-pay

## 2019-10-21 ENCOUNTER — Ambulatory Visit: Payer: Medicare Other | Attending: Internal Medicine

## 2019-10-21 DIAGNOSIS — Z20822 Contact with and (suspected) exposure to covid-19: Secondary | ICD-10-CM

## 2019-10-21 DIAGNOSIS — Z20828 Contact with and (suspected) exposure to other viral communicable diseases: Secondary | ICD-10-CM | POA: Diagnosis not present

## 2019-10-22 ENCOUNTER — Encounter (HOSPITAL_COMMUNITY): Payer: Self-pay

## 2019-10-22 DIAGNOSIS — R509 Fever, unspecified: Secondary | ICD-10-CM | POA: Diagnosis not present

## 2019-10-22 DIAGNOSIS — I10 Essential (primary) hypertension: Secondary | ICD-10-CM | POA: Diagnosis not present

## 2019-10-22 LAB — NOVEL CORONAVIRUS, NAA: SARS-CoV-2, NAA: NOT DETECTED

## 2019-10-26 ENCOUNTER — Telehealth: Payer: Self-pay | Admitting: Internal Medicine

## 2019-10-26 NOTE — Telephone Encounter (Signed)
Patient called in and received his negative covid test result  °

## 2019-10-27 ENCOUNTER — Encounter (HOSPITAL_COMMUNITY): Payer: Self-pay

## 2019-10-29 ENCOUNTER — Encounter (HOSPITAL_COMMUNITY): Payer: Self-pay

## 2019-10-29 DIAGNOSIS — A084 Viral intestinal infection, unspecified: Secondary | ICD-10-CM | POA: Diagnosis not present

## 2019-10-29 DIAGNOSIS — R110 Nausea: Secondary | ICD-10-CM | POA: Diagnosis not present

## 2019-10-29 DIAGNOSIS — R7401 Elevation of levels of liver transaminase levels: Secondary | ICD-10-CM | POA: Diagnosis not present

## 2019-11-02 ENCOUNTER — Telehealth (HOSPITAL_COMMUNITY): Payer: Self-pay | Admitting: Licensed Clinical Social Worker

## 2019-11-02 LAB — CUP PACEART INCLINIC DEVICE CHECK
Date Time Interrogation Session: 20200929155553
Implantable Lead Implant Date: 20170406
Implantable Lead Implant Date: 20170406
Implantable Lead Implant Date: 20170406
Implantable Lead Location: 753858
Implantable Lead Location: 753859
Implantable Lead Location: 753860
Implantable Pulse Generator Implant Date: 20170406
Pulse Gen Model: 3262
Pulse Gen Serial Number: 7866838

## 2019-11-02 NOTE — Telephone Encounter (Signed)
CSW called pt to inform that PAN foundation is currently open- pt had been on [employer] but it expired last month.  Pt agreeable to applying for PAN- pt approved:  Member ID: [id number] Group ID: [id number] RxBin ID: [id number] PCN: [id number] Eligibility Start Date: 11/02/2019 Eligibility End Date: 10/31/2020 Assistance Amount: $1,000.00  CSW called and provided information to pt pharmacy  Jorge Ny, [place] Worker [facility] Desk#: [phone] Cell#: [phone]

## 2019-11-03 ENCOUNTER — Encounter (HOSPITAL_COMMUNITY): Payer: Self-pay

## 2019-11-05 ENCOUNTER — Encounter (HOSPITAL_COMMUNITY): Payer: Self-pay

## 2019-11-06 ENCOUNTER — Other Ambulatory Visit: Payer: Self-pay

## 2019-11-06 ENCOUNTER — Encounter: Payer: Self-pay | Admitting: Podiatry

## 2019-11-06 ENCOUNTER — Ambulatory Visit (INDEPENDENT_AMBULATORY_CARE_PROVIDER_SITE_OTHER): Payer: Medicare Other | Admitting: Podiatry

## 2019-11-06 DIAGNOSIS — D689 Coagulation defect, unspecified: Secondary | ICD-10-CM | POA: Diagnosis not present

## 2019-11-06 DIAGNOSIS — M79676 Pain in unspecified toe(s): Secondary | ICD-10-CM | POA: Diagnosis not present

## 2019-11-06 DIAGNOSIS — B351 Tinea unguium: Secondary | ICD-10-CM

## 2019-11-06 DIAGNOSIS — I739 Peripheral vascular disease, unspecified: Secondary | ICD-10-CM | POA: Diagnosis not present

## 2019-11-06 NOTE — Progress Notes (Signed)
Patient ID: Henry Neal, male   DOB: 1927-10-03, 86 y.o.   MRN: [id number] Complaint:  Visit Type: Patient returns to my office for continued preventative foot care services. Complaint: Patient states" my nails have grown long and thick and become painful to walk and wear shoes"  He presents for preventative foot care services. No changes to ROS.  Patient is taking plavix.  Podiatric Exam: Vascular: dorsalis pedis and posterior tibial pulses are weakly  palpable bilateral. Capillary return is immediate. Temperature gradient is WNL. Skin turgor WNL  Sensorium: Normal Semmes Weinstein monofilament test. Normal tactile sensation bilaterally. Nail Exam: Pt has thick disfigured discolored nails with subungual debris noted bilateral entire nail hallux through fifth toenails Ulcer Exam: There is no evidence of ulcer or pre-ulcerative changes or infection. Orthopedic Exam: Muscle tone and strength are WNL. No limitations in general ROM. No crepitus or effusions noted. Foot type and digits show no abnormalities. Bony prominences are unremarkable. Skin: No Porokeratosis. No infection or ulcers.  Diagnosis:  Tinea unguium, Pain in right toe, pain in left toes,   Treatment & Plan Procedures and Treatment: Consent by patient was obtained for treatment procedures. The patient understood the discussion of treatment and procedures well. All questions were answered thoroughly reviewed. Debridement of mycotic and hypertrophic toenails, 1 through 5 bilateral and clearing of subungual debris. No ulceration, no infection noted.   Return Visit-Office Procedure: Patient instructed to return to the office for a follow up visit 10  weeks  for continued evaluation and treatment.   Gardiner Barefoot DPM

## 2019-11-09 DIAGNOSIS — R110 Nausea: Secondary | ICD-10-CM | POA: Diagnosis not present

## 2019-11-09 DIAGNOSIS — I130 Hypertensive heart and chronic kidney disease with heart failure and stage 1 through stage 4 chronic kidney disease, or unspecified chronic kidney disease: Secondary | ICD-10-CM | POA: Diagnosis not present

## 2019-11-09 DIAGNOSIS — I2510 Atherosclerotic heart disease of native coronary artery without angina pectoris: Secondary | ICD-10-CM | POA: Diagnosis not present

## 2019-11-10 ENCOUNTER — Encounter (HOSPITAL_COMMUNITY): Payer: Self-pay

## 2019-11-12 ENCOUNTER — Encounter (HOSPITAL_COMMUNITY): Payer: Self-pay

## 2019-11-16 ENCOUNTER — Telehealth (HOSPITAL_COMMUNITY): Payer: Self-pay | Admitting: Pharmacy Technician

## 2019-11-16 DIAGNOSIS — K5900 Constipation, unspecified: Secondary | ICD-10-CM | POA: Diagnosis not present

## 2019-11-16 DIAGNOSIS — R630 Anorexia: Secondary | ICD-10-CM | POA: Diagnosis not present

## 2019-11-16 DIAGNOSIS — R110 Nausea: Secondary | ICD-10-CM | POA: Diagnosis not present

## 2019-11-16 NOTE — Telephone Encounter (Signed)
Received a message stating that the patient may have a high co pay and hopefully a tier exception would allow him to be able to use his grant for the entire year.  Found out that patient's co pay was $37.00. No tier exception needed at this time as the grant is $1,000 and should last the whole year based off of that information.  Charlann Boxer, CPhT

## 2019-11-17 ENCOUNTER — Encounter (HOSPITAL_COMMUNITY): Payer: Self-pay

## 2019-11-18 DIAGNOSIS — R110 Nausea: Secondary | ICD-10-CM | POA: Diagnosis not present

## 2019-11-18 DIAGNOSIS — I700 Atherosclerosis of aorta: Secondary | ICD-10-CM | POA: Diagnosis not present

## 2019-11-18 DIAGNOSIS — Z0000 Encounter for general adult medical examination without abnormal findings: Secondary | ICD-10-CM | POA: Diagnosis not present

## 2019-11-18 DIAGNOSIS — I130 Hypertensive heart and chronic kidney disease with heart failure and stage 1 through stage 4 chronic kidney disease, or unspecified chronic kidney disease: Secondary | ICD-10-CM | POA: Diagnosis not present

## 2019-11-18 DIAGNOSIS — I739 Peripheral vascular disease, unspecified: Secondary | ICD-10-CM | POA: Diagnosis not present

## 2019-11-18 DIAGNOSIS — A084 Viral intestinal infection, unspecified: Secondary | ICD-10-CM | POA: Diagnosis not present

## 2019-11-19 ENCOUNTER — Encounter (HOSPITAL_COMMUNITY): Payer: Self-pay

## 2019-11-29 ENCOUNTER — Other Ambulatory Visit: Payer: Self-pay | Admitting: Internal Medicine

## 2019-12-08 DIAGNOSIS — H52223 Regular astigmatism, bilateral: Secondary | ICD-10-CM | POA: Diagnosis not present

## 2019-12-11 ENCOUNTER — Ambulatory Visit (INDEPENDENT_AMBULATORY_CARE_PROVIDER_SITE_OTHER): Payer: Medicare Other | Admitting: *Deleted

## 2019-12-11 DIAGNOSIS — Z950 Presence of cardiac pacemaker: Secondary | ICD-10-CM

## 2019-12-11 LAB — CUP PACEART REMOTE DEVICE CHECK
Battery Remaining Longevity: 107 mo
Battery Remaining Percentage: 95.5 %
Battery Voltage: 2.96 V
Brady Statistic AP VP Percent: 5.1 %
Brady Statistic AP VS Percent: 1 %
Brady Statistic AS VP Percent: 95 %
Brady Statistic AS VS Percent: 1 %
Brady Statistic RA Percent Paced: 4.7 %
Date Time Interrogation Session: 20210219090604
Implantable Lead Implant Date: 20170406
Implantable Lead Implant Date: 20170406
Implantable Lead Implant Date: 20170406
Implantable Lead Location: 753858
Implantable Lead Location: 753859
Implantable Lead Location: 753860
Implantable Pulse Generator Implant Date: 20170406
Lead Channel Impedance Value: 450 Ohm
Lead Channel Impedance Value: 460 Ohm
Lead Channel Impedance Value: 840 Ohm
Lead Channel Pacing Threshold Amplitude: 0.5 V
Lead Channel Pacing Threshold Amplitude: 1 V
Lead Channel Pacing Threshold Amplitude: 1.125 V
Lead Channel Pacing Threshold Pulse Width: 0.4 ms
Lead Channel Pacing Threshold Pulse Width: 0.4 ms
Lead Channel Pacing Threshold Pulse Width: 0.4 ms
Lead Channel Sensing Intrinsic Amplitude: 12 mV
Lead Channel Sensing Intrinsic Amplitude: 2.6 mV
Lead Channel Setting Pacing Amplitude: 2 V
Lead Channel Setting Pacing Amplitude: 2 V
Lead Channel Setting Pacing Amplitude: 2.125
Lead Channel Setting Pacing Pulse Width: 0.4 ms
Lead Channel Setting Pacing Pulse Width: 0.4 ms
Lead Channel Setting Sensing Sensitivity: 2 mV
Pulse Gen Model: 3262
Pulse Gen Serial Number: 7866838

## 2019-12-11 NOTE — Progress Notes (Signed)
PPM Remote  

## 2019-12-16 DIAGNOSIS — M170 Bilateral primary osteoarthritis of knee: Secondary | ICD-10-CM | POA: Diagnosis not present

## 2019-12-16 DIAGNOSIS — M159 Polyosteoarthritis, unspecified: Secondary | ICD-10-CM | POA: Diagnosis not present

## 2019-12-16 DIAGNOSIS — M25461 Effusion, right knee: Secondary | ICD-10-CM | POA: Diagnosis not present

## 2019-12-16 DIAGNOSIS — M25569 Pain in unspecified knee: Secondary | ICD-10-CM | POA: Diagnosis not present

## 2019-12-21 ENCOUNTER — Other Ambulatory Visit: Payer: Self-pay

## 2019-12-21 ENCOUNTER — Encounter (HOSPITAL_COMMUNITY): Payer: Self-pay | Admitting: Cardiology

## 2019-12-21 ENCOUNTER — Ambulatory Visit (HOSPITAL_COMMUNITY)
Admission: RE | Admit: 2019-12-21 | Discharge: 2019-12-21 | Disposition: A | Discharge status: Home / Self Care | Payer: Medicare Other | Source: Ambulatory Visit | Attending: Cardiology | Admitting: Cardiology

## 2019-12-21 VITALS — BP 110/42 | HR 69 | Wt 170.2 lb

## 2019-12-21 DIAGNOSIS — Z955 Presence of coronary angioplasty implant and graft: Secondary | ICD-10-CM | POA: Insufficient documentation

## 2019-12-21 DIAGNOSIS — Z8249 Family history of ischemic heart disease and other diseases of the circulatory system: Secondary | ICD-10-CM | POA: Insufficient documentation

## 2019-12-21 DIAGNOSIS — I255 Ischemic cardiomyopathy: Secondary | ICD-10-CM | POA: Diagnosis not present

## 2019-12-21 DIAGNOSIS — Z87891 Personal history of nicotine dependence: Secondary | ICD-10-CM | POA: Insufficient documentation

## 2019-12-21 DIAGNOSIS — I5042 Chronic combined systolic (congestive) and diastolic (congestive) heart failure: Secondary | ICD-10-CM | POA: Insufficient documentation

## 2019-12-21 DIAGNOSIS — Z79899 Other long term (current) drug therapy: Secondary | ICD-10-CM | POA: Insufficient documentation

## 2019-12-21 DIAGNOSIS — Z7902 Long term (current) use of antithrombotics/antiplatelets: Secondary | ICD-10-CM | POA: Insufficient documentation

## 2019-12-21 DIAGNOSIS — Z7982 Long term (current) use of aspirin: Secondary | ICD-10-CM | POA: Insufficient documentation

## 2019-12-21 DIAGNOSIS — N189 Chronic kidney disease, unspecified: Secondary | ICD-10-CM | POA: Diagnosis not present

## 2019-12-21 DIAGNOSIS — I6522 Occlusion and stenosis of left carotid artery: Secondary | ICD-10-CM | POA: Diagnosis not present

## 2019-12-21 DIAGNOSIS — I2510 Atherosclerotic heart disease of native coronary artery without angina pectoris: Secondary | ICD-10-CM | POA: Diagnosis not present

## 2019-12-21 DIAGNOSIS — I252 Old myocardial infarction: Secondary | ICD-10-CM | POA: Diagnosis not present

## 2019-12-21 DIAGNOSIS — I5022 Chronic systolic (congestive) heart failure: Secondary | ICD-10-CM

## 2019-12-21 DIAGNOSIS — I2582 Chronic total occlusion of coronary artery: Secondary | ICD-10-CM | POA: Insufficient documentation

## 2019-12-21 LAB — BASIC METABOLIC PANEL
Anion gap: 8 (ref 5–15)
BUN: 18 mg/dL (ref 8–23)
CO2: 24 mmol/L (ref 22–32)
Calcium: 9 mg/dL (ref 8.9–10.3)
Chloride: 105 mmol/L (ref 98–111)
Creatinine, Ser: 0.92 mg/dL (ref 0.61–1.24)
GFR calc Af Amer: >60 mL/min (ref >60)
GFR calc non Af Amer: >60 mL/min (ref >60)
Glucose, Bld: 112 mg/dL — ABNORMAL HIGH (ref 70–99)
Potassium: 4.2 mmol/L (ref 3.5–5.1)
Sodium: 137 mmol/L (ref 135–145)

## 2019-12-21 NOTE — Patient Instructions (Signed)
No medication changes today!   Labs today We will only contact you if something comes back abnormal or we need to make some changes. Otherwise no news is good news!   Your physician recommends that you schedule a follow-up appointment in: 4 months with Dr McLean.  We will call you to schedule this appointment.   Please call office at [phone] option 2 if you have any questions or concerns.     At the [facility], you and your health needs are our priority. As part of our continuing mission to provide you with exceptional heart care, we have created designated Provider Care Teams. These Care Teams include your primary Cardiologist (physician) and Advanced Practice Providers (APPs- Physician Assistants and Nurse Practitioners) who all work together to provide you with the care you need, when you need it.   You may see any of the following providers on your designated Care Team at your next follow up: . Dr Daniel Bensimhon . Dr Dalton McLean . Amy Clegg, NP . Brittainy Simmons, PA . Lauren Kemp, PharmD   Please be sure to bring in all your medications bottles to every appointment.      

## 2019-12-21 NOTE — Progress Notes (Signed)
Patient ID: Henry Neal, male   DOB: July 01, 1927, 86 y.o.   MRN: [id number]      [facility] Note  PCP: Dr. 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 male with history of CAD, chronic systolic CHF, LBBB, AAA s/p repair presents for followup of CHF and CAD.  He had a cardiac cath in 12/15, showing total occlusion of the LAD and 50-60% proximal RCA.  EF at that time was near-normal.  In 10/16, he developed dyspnea and epigastric pain.  He was noted to be in acute pulmonary edema and was diuresed.  Troponin was only mildly elevated at 0.47.  He ultimately underwent LHC showing hazy 80-90% RCA stenosis which was likely the culprit for his presentation.  Additionally, the LAD was only subtotally occluded on this cath.  He had DES to RCA.  Cardiac MRI was done, showing EF 22% with significant viability in LAD territory.  Therefore, CTO of LAD was opened with DES.  Echo in 2/17 showed EF persistently low at 15%.  He had St Jude CRT-P placement in 4/17. Echo in 1/19 showed EF up to 45-50% with mild MR.    Echo in 3/20 showed EF 45% with mild diffuse hypokinesis, mildly decreased RV systolic function.   Patient felt "sick" the whole month of January, thinks he may have had COVID-19 but was not tested.  Now feeling back to normal. Still works as a Furniture conservator/restorer.  Weight is down 4 lbs. No dyspnea walking on flat ground or doing ADLs.  No chest pain.  No orthopnea/PND.  Rarely uses Lasix.  Occasional vertigo, uses Meclizine. Doing maintenance cardiac rehab.    St Jude device interrogation: >99% BiV pacing, no atrial fibrillation, stable thoracic impedance.    Labs (10/16): hgb 9.7, K 4.6, creatinine 1.12 Labs (11/16): K 5.9, creatinine 1.5 Labs (12/16); K 4.2, creatinine 1.07 => 1.02, BNP 448 Labs (1/17): K 4.1, creatinine 1.13, BNP 423, LDL 40, HDL 47 Labs (4/17): K 3.9, creatinine 1.03, HCT 32.6 Labs (5/17): K 4.4, creatinine 1.09 Labs (1/18): LDL 37 Labs (01/25/2017):  K 3.5 Creatinine 0.96  Labs (8/18): BNP 30, K 3.7, creatinine 1.0 Labs (11/18): LDL 40, HDL 47, K 3.5, creatinine 0.96  Labs (3/19): K 4, creatinine 0.97 Labs (11/19): K 3.9, creatinine 1.2, LDL 44 Labs (4/20): K 4.1, creatinine 1.11 Labs (10/20): LDL 46 Labs (11/20): K 4.4, creatinine 1.05  PMH: 1. CKD 2. AAA s/p repair, followed at [facility]. 3. Carotid stenosis: s/p right CEA.   Carotid dopplers (2/16) with right CEA stable, < [id number] LICA stenosis.  Followed at [facility].  - Carotid dopplers ([id number]): [id number] LICA, patent right CEA.  4. Chronic LBBB 5. LAD: LHC (12/15) with totally occluded LAD, 50-60% pRCA => medically managed.  10/16 admitted with NSTEMI.  LHC with subtotalled LAD occlusion, 80-90% pRCA with thrombus => DES RCA initially.  Cardiac MRI was done showing viability in the LAD territory, so he had DES to the CTO LAD.   6. Chronic systolic CHF: Ischemic cardiomyopathy.  Echo (10/16) with EF 15% with wall motion abnormalities, moderately dilated RV with normal systolic function.  CMRI (10/16) with severe LV dilation, EF 22% with septal-lateral dyssynchrony, no LV thrombus, normal RV size/systolic function => significant viability noted in the LAD territory.  Echo (2/17): EF 15%, severe LV dilation, wall motion abnormalities noted, mild MR, mildly dilated RV with normal systolic function, PASP 55 mmHg.  St Jude CRT-P 4/17.  - Echo (  1/18): EF 50-55%, mild to moderate MR.  - Echo (1/19): EF 45-50%, mild LVH, inferior hypokinesis, mild MR, PASP 30 mmHg.  - Echo (3/20): EF 45%, diffuse hypokinesis, normal RV size with mildly decreased systolic function.  7. ABIs (10/16) were normal.  8. Raynaud's syndrome.  9. Sciatica  SH: Lives alone, prior smoker, still working as a Furniture conservator/restorer.  FH: CAD  Review of systems complete and found to be negative unless listed in HPI.    Current Outpatient Medications  Medication Sig Dispense Refill  . acetaminophen (TYLENOL) 500 MG tablet Take 1,000 mg by mouth  daily as needed for mild pain or headache. Reported on 01/25/2016    . aspirin EC 81 MG tablet Take 81 mg by mouth daily.      Marland Kitchen atorvastatin (LIPITOR) 40 MG tablet Take 1 tablet (40 mg total) by mouth daily at 6 PM. 30 tablet 6  . Calcium Carbonate-Vitamin D (CALCIUM 600 + D PO) Take 1 tablet by mouth 2 (two) times daily.     . carvedilol (COREG) 3.125 MG tablet Take 1 tablet (3.125 mg total) by mouth 2 (two) times daily. 60 tablet 3  . clopidogrel (PLAVIX) 75 MG tablet TAKE 1 TABLET BY MOUTH EVERY DAY WITH BREAKFAST 90 tablet 3  . clotrimazole-betamethasone (LOTRISONE) cream APPLY TO AA BID  1  . diclofenac Sodium (VOLTAREN) 1 % GEL diclofenac 1 % topical gel    . finasteride (PROSCAR) 5 MG tablet take 1 tablet by mouth once daily    . fluticasone (FLONASE) 50 MCG/ACT nasal spray Place 1 spray into both nostrils daily as needed for allergies.     . furosemide (LASIX) 20 MG tablet TAKE 1 TABLET BY MOUTH ONCE WEEKLY AS NEEDED FOR WEIGHT 167LBS OR MORE; IF NEEDED MORE THAN ONCE WEEKLY. CALL CHF CLINIC [phone] 15 tablet 3  . gabapentin (NEURONTIN) 300 MG capsule Take 300 mg by mouth 2 (two) times daily.  0  . ketoconazole (NIZORAL) 2 % shampoo APPLY AA ONCE OR TWICE A WEEK TO SCALP AND THEN USE REGULAR SHAMPOO    . meclizine (ANTIVERT) 25 MG tablet Take 1 tablet (25 mg total) by mouth 2 (two) times daily as needed for dizziness. 30 tablet 0  . meloxicam (MOBIC) 15 MG tablet Take 15 mg by mouth daily as needed for pain.  0  . mometasone (ELOCON) 0.1 % cream Apply 1 application topically daily as needed (for irritation).     . Multiple Vitamin (MULTIVITAMIN PO) Take 1 tablet by mouth daily. ALIVE men's vitamin    . nitroGLYCERIN (NITROSTAT) 0.4 MG SL tablet PLACE 1 TABLET UNDER THE TONGUE IF NEEDED EVERY 5 MINUTES FOR CHEST PAIN FOR 3 DOSES IF NO RELIEF AFTER FIRST DOSE CALL PRESCRIBER OR 911 25 tablet 5  . ondansetron (ZOFRAN) 4 MG tablet Take 4 mg by mouth 3 (three) times daily as needed.    .  pantoprazole (PROTONIX) 40 MG tablet Take 40 mg by mouth 2 (two) times daily.   0  . polyethylene glycol (MIRALAX / GLYCOLAX) packet Take 17 g by mouth daily as needed for mild constipation.     . sacubitril-valsartan (ENTRESTO) 24-26 MG Take 1 tablet by mouth 2 (two) times daily. 60 tablet 3  . spironolactone (ALDACTONE) 25 MG tablet Take 1 tablet (25 mg total) by mouth daily. 90 tablet 3   No current facility-administered medications for this encounter.   BP (!) 110/42   Pulse 69   Wt 77.2 kg (170 lb  3.2 oz)   SpO2 97%   BMI 24.42 kg/m   Wt Readings from Last 3 Encounters:  12/21/19 77.2 kg (170 lb 3.2 oz)  08/21/19 79.2 kg (174 lb 9.6 oz)  07/21/19 78.9 kg (174 lb)    General: NAD Neck: No JVD, no thyromegaly or thyroid nodule.  Lungs: Clear to auscultation bilaterally with normal respiratory effort. CV: Nondisplaced PMI.  Heart regular S1/S2, no S3/S4, no murmur.  No peripheral edema.  No carotid bruit.  Normal pedal pulses.  Abdomen: Soft, nontender, no hepatosplenomegaly, no distention.  Skin: Intact without lesions or rashes.  Neurologic: Alert and oriented x 3.  Psych: Normal affect. Extremities: No clubbing or cyanosis.  HEENT: Normal.   Assessment/Plan: 1. CAD: NSTEMI with DES to RCA (culprit vessel) and subsequent DES to CTO of LAD (cMRI showed viability in the LAD distribution) in 10/16. No chest pain.   - Continue ASA 81 and Plavix 75.  - atorvastatin 40 daily, good lipids in 10/20.    2. Chronic systolic/diastolic heart failure: Ischemic cardiomyopathy. Echo in 3/20 showed EF 45%, improved since getting CRT.  St Jude CRT-D.  NYHA class II.  Volume status looks ok on exam.   BP soft today, will hold off on medication titration.  - Continue to use Lasix prn.   - Continue Coreg 3.125 mg bid.  - Continue spironolactone 25 mg daily, BMET today.   - Continue Entresto 24/26 mg BID.  - Continue Cardiac Rehab maintenance program.  - He is due for repeat echo, I will  arrange.  3. Carotid stenosis:  - Follows at [facility].  Followup in 4 months  Loralie Champagne, MD  12/21/2019

## 2019-12-24 DIAGNOSIS — A084 Viral intestinal infection, unspecified: Secondary | ICD-10-CM | POA: Diagnosis not present

## 2019-12-24 DIAGNOSIS — R110 Nausea: Secondary | ICD-10-CM | POA: Diagnosis not present

## 2019-12-28 ENCOUNTER — Other Ambulatory Visit: Payer: Self-pay

## 2019-12-28 ENCOUNTER — Other Ambulatory Visit (HOSPITAL_COMMUNITY): Payer: Self-pay

## 2019-12-28 ENCOUNTER — Ambulatory Visit (HOSPITAL_COMMUNITY)
Admission: RE | Admit: 2019-12-28 | Discharge: 2019-12-28 | Disposition: A | Discharge status: Home / Self Care | Payer: Medicare Other | Source: Ambulatory Visit | Attending: Cardiology | Admitting: Cardiology

## 2019-12-28 DIAGNOSIS — I340 Nonrheumatic mitral (valve) insufficiency: Secondary | ICD-10-CM | POA: Insufficient documentation

## 2019-12-28 DIAGNOSIS — I5022 Chronic systolic (congestive) heart failure: Secondary | ICD-10-CM

## 2019-12-28 MED ORDER — CARVEDILOL 3.125 MG PO TABS: 3.1250 mg | ORAL_TABLET | Freq: Two times a day (BID) | ORAL | 4 refills | Status: DC

## 2019-12-28 MED ORDER — ENTRESTO 24-26 MG PO TABS: 1.0000 | ORAL_TABLET | Freq: Two times a day (BID) | ORAL | 4 refills | Status: DC

## 2019-12-28 NOTE — Progress Notes (Signed)
  Echocardiogram 2D Echocardiogram has been performed.  Henry Neal 12/28/2019, 9:16 AM

## 2019-12-31 DIAGNOSIS — N182 Chronic kidney disease, stage 2 (mild): Secondary | ICD-10-CM | POA: Diagnosis not present

## 2019-12-31 DIAGNOSIS — I130 Hypertensive heart and chronic kidney disease with heart failure and stage 1 through stage 4 chronic kidney disease, or unspecified chronic kidney disease: Secondary | ICD-10-CM | POA: Diagnosis not present

## 2019-12-31 DIAGNOSIS — A084 Viral intestinal infection, unspecified: Secondary | ICD-10-CM | POA: Diagnosis not present

## 2019-12-31 DIAGNOSIS — R110 Nausea: Secondary | ICD-10-CM | POA: Diagnosis not present

## 2020-01-06 DIAGNOSIS — R110 Nausea: Secondary | ICD-10-CM | POA: Diagnosis not present

## 2020-01-06 DIAGNOSIS — R630 Anorexia: Secondary | ICD-10-CM | POA: Diagnosis not present

## 2020-01-15 ENCOUNTER — Other Ambulatory Visit: Payer: Self-pay

## 2020-01-15 ENCOUNTER — Ambulatory Visit (INDEPENDENT_AMBULATORY_CARE_PROVIDER_SITE_OTHER): Payer: Medicare Other | Admitting: Podiatry

## 2020-01-15 ENCOUNTER — Encounter: Payer: Self-pay | Admitting: Podiatry

## 2020-01-15 VITALS — Temp 97.6°F

## 2020-01-15 DIAGNOSIS — D689 Coagulation defect, unspecified: Secondary | ICD-10-CM

## 2020-01-15 DIAGNOSIS — I739 Peripheral vascular disease, unspecified: Secondary | ICD-10-CM | POA: Diagnosis not present

## 2020-01-15 DIAGNOSIS — M79676 Pain in unspecified toe(s): Secondary | ICD-10-CM | POA: Diagnosis not present

## 2020-01-15 DIAGNOSIS — B351 Tinea unguium: Secondary | ICD-10-CM | POA: Diagnosis not present

## 2020-01-16 NOTE — Progress Notes (Signed)
This patient returns to my office for at risk foot care.  This patient requires this care by a professional since this patient will be at risk due to having renal insufficiency coagulation defect and PAD.   Patient is taking plavix.    This patient is unable to cut nails himself since the patient cannot reach his nails.These nails are painful walking and wearing shoes.  This patient presents for at risk foot care today.  General Appearance  Alert, conversant and in no acute stress.  Vascular  Dorsalis pedis and posterior tibial  pulses are weakly  palpable  bilaterally.  Capillary return is within normal limits  bilaterally. Temperature is within normal limits  bilaterally.  Neurologic  Senn-Weinstein monofilament wire test within normal limits  bilaterally. Muscle power within normal limits bilaterally.  Nails Thick disfigured discolored nails with subungual debris  from hallux to fifth toes bilaterally. No evidence of bacterial infection or drainage bilaterally.  Orthopedic  No limitations of motion  feet .  No crepitus or effusions noted.  No bony pathology or digital deformities noted.  Skin  normotropic skin with no porokeratosis noted bilaterally.  No signs of infections or ulcers noted.     Onychomycosis  Pain in right toes  Pain in left toes  Consent was obtained for treatment procedures.   Mechanical debridement of nails 1-5  bilaterally performed with a nail nipper.  Filed with dremel without incident.    Return office visit   10 weeks                   Told patient to return for periodic foot care and evaluation due to potential at risk complications.   Soriya Worster DPM  

## 2020-01-19 ENCOUNTER — Other Ambulatory Visit: Payer: Self-pay

## 2020-03-11 ENCOUNTER — Ambulatory Visit (INDEPENDENT_AMBULATORY_CARE_PROVIDER_SITE_OTHER): Payer: Medicare Other | Admitting: *Deleted

## 2020-03-11 DIAGNOSIS — I441 Atrioventricular block, second degree: Secondary | ICD-10-CM | POA: Diagnosis not present

## 2020-03-11 DIAGNOSIS — I5022 Chronic systolic (congestive) heart failure: Secondary | ICD-10-CM

## 2020-03-11 LAB — CUP PACEART REMOTE DEVICE CHECK
Battery Remaining Longevity: 107 mo
Battery Remaining Percentage: 95.5 %
Battery Voltage: 2.96 V
Brady Statistic AP VP Percent: 4.8 %
Brady Statistic AP VS Percent: 1 %
Brady Statistic AS VP Percent: 95 %
Brady Statistic AS VS Percent: 1 %
Brady Statistic RA Percent Paced: 4.4 %
Date Time Interrogation Session: 20210521114059
Implantable Lead Implant Date: 20170406
Implantable Lead Implant Date: 20170406
Implantable Lead Implant Date: 20170406
Implantable Lead Location: 753858
Implantable Lead Location: 753859
Implantable Lead Location: 753860
Implantable Pulse Generator Implant Date: 20170406
Lead Channel Impedance Value: 460 Ohm
Lead Channel Impedance Value: 480 Ohm
Lead Channel Impedance Value: 790 Ohm
Lead Channel Pacing Threshold Amplitude: 0.5 V
Lead Channel Pacing Threshold Amplitude: 1.125 V
Lead Channel Pacing Threshold Amplitude: 1.25 V
Lead Channel Pacing Threshold Pulse Width: 0.4 ms
Lead Channel Pacing Threshold Pulse Width: 0.4 ms
Lead Channel Pacing Threshold Pulse Width: 0.4 ms
Lead Channel Sensing Intrinsic Amplitude: 12 mV
Lead Channel Sensing Intrinsic Amplitude: 2.4 mV
Lead Channel Setting Pacing Amplitude: 2 V
Lead Channel Setting Pacing Amplitude: 2.125
Lead Channel Setting Pacing Amplitude: 2.25 V
Lead Channel Setting Pacing Pulse Width: 0.4 ms
Lead Channel Setting Pacing Pulse Width: 0.4 ms
Lead Channel Setting Sensing Sensitivity: 2 mV
Pulse Gen Model: 3262
Pulse Gen Serial Number: 7866838

## 2020-03-11 NOTE — Progress Notes (Signed)
Remote pacemaker transmission.   

## 2020-03-29 DIAGNOSIS — M50322 Other cervical disc degeneration at C5-C6 level: Secondary | ICD-10-CM | POA: Diagnosis not present

## 2020-03-29 DIAGNOSIS — M79641 Pain in right hand: Secondary | ICD-10-CM | POA: Diagnosis not present

## 2020-03-29 DIAGNOSIS — M79643 Pain in unspecified hand: Secondary | ICD-10-CM | POA: Diagnosis not present

## 2020-03-29 DIAGNOSIS — M159 Polyosteoarthritis, unspecified: Secondary | ICD-10-CM | POA: Diagnosis not present

## 2020-03-29 DIAGNOSIS — M545 Low back pain: Secondary | ICD-10-CM | POA: Diagnosis not present

## 2020-03-29 DIAGNOSIS — M546 Pain in thoracic spine: Secondary | ICD-10-CM | POA: Diagnosis not present

## 2020-03-29 DIAGNOSIS — M79642 Pain in left hand: Secondary | ICD-10-CM | POA: Diagnosis not present

## 2020-03-29 DIAGNOSIS — M25569 Pain in unspecified knee: Secondary | ICD-10-CM | POA: Diagnosis not present

## 2020-03-29 DIAGNOSIS — M549 Dorsalgia, unspecified: Secondary | ICD-10-CM | POA: Diagnosis not present

## 2020-03-29 DIAGNOSIS — M7989 Other specified soft tissue disorders: Secondary | ICD-10-CM | POA: Diagnosis not present

## 2020-03-29 DIAGNOSIS — M50323 Other cervical disc degeneration at C6-C7 level: Secondary | ICD-10-CM | POA: Diagnosis not present

## 2020-03-29 DIAGNOSIS — M170 Bilateral primary osteoarthritis of knee: Secondary | ICD-10-CM | POA: Diagnosis not present

## 2020-03-29 DIAGNOSIS — M19041 Primary osteoarthritis, right hand: Secondary | ICD-10-CM | POA: Diagnosis not present

## 2020-03-29 DIAGNOSIS — M25461 Effusion, right knee: Secondary | ICD-10-CM | POA: Diagnosis not present

## 2020-03-29 DIAGNOSIS — M19042 Primary osteoarthritis, left hand: Secondary | ICD-10-CM | POA: Diagnosis not present

## 2020-04-05 ENCOUNTER — Telehealth (HOSPITAL_COMMUNITY): Payer: Self-pay | Admitting: Internal Medicine

## 2020-04-12 ENCOUNTER — Telehealth (HOSPITAL_COMMUNITY): Payer: Self-pay | Admitting: *Deleted

## 2020-04-14 DIAGNOSIS — H02055 Trichiasis without entropian left lower eyelid: Secondary | ICD-10-CM | POA: Diagnosis not present

## 2020-04-15 DIAGNOSIS — M159 Polyosteoarthritis, unspecified: Secondary | ICD-10-CM | POA: Diagnosis not present

## 2020-04-15 DIAGNOSIS — M170 Bilateral primary osteoarthritis of knee: Secondary | ICD-10-CM | POA: Diagnosis not present

## 2020-04-15 DIAGNOSIS — M25461 Effusion, right knee: Secondary | ICD-10-CM | POA: Diagnosis not present

## 2020-04-15 DIAGNOSIS — M25569 Pain in unspecified knee: Secondary | ICD-10-CM | POA: Diagnosis not present

## 2020-04-20 ENCOUNTER — Other Ambulatory Visit: Payer: Self-pay

## 2020-04-20 ENCOUNTER — Ambulatory Visit (INDEPENDENT_AMBULATORY_CARE_PROVIDER_SITE_OTHER): Payer: Medicare Other | Admitting: Podiatry

## 2020-04-20 ENCOUNTER — Encounter: Payer: Self-pay | Admitting: Podiatry

## 2020-04-20 DIAGNOSIS — I739 Peripheral vascular disease, unspecified: Secondary | ICD-10-CM | POA: Diagnosis not present

## 2020-04-20 DIAGNOSIS — D689 Coagulation defect, unspecified: Secondary | ICD-10-CM

## 2020-04-20 DIAGNOSIS — M79676 Pain in unspecified toe(s): Secondary | ICD-10-CM

## 2020-04-20 DIAGNOSIS — B351 Tinea unguium: Secondary | ICD-10-CM | POA: Diagnosis not present

## 2020-04-20 NOTE — Progress Notes (Signed)
This patient returns to my office for at risk foot care.  This patient requires this care by a professional since this patient will be at risk due to having renal insufficiency coagulation defect and PAD.   Patient is taking plavix.    This patient is unable to cut nails himself since the patient cannot reach his nails.These nails are painful walking and wearing shoes.  This patient presents for at risk foot care today.  General Appearance  Alert, conversant and in no acute stress.  Vascular  Dorsalis pedis and posterior tibial  pulses are weakly  palpable  bilaterally.  Capillary return is within normal limits  bilaterally. Temperature is within normal limits  bilaterally.  Neurologic  Senn-Weinstein monofilament wire test within normal limits  bilaterally. Muscle power within normal limits bilaterally.  Nails Thick disfigured discolored nails with subungual debris  from hallux to fifth toes bilaterally. No evidence of bacterial infection or drainage bilaterally.  Orthopedic  No limitations of motion  feet .  No crepitus or effusions noted.  No bony pathology or digital deformities noted.  Skin  normotropic skin with no porokeratosis noted bilaterally.  No signs of infections or ulcers noted.     Onychomycosis  Pain in right toes  Pain in left toes  Consent was obtained for treatment procedures.   Mechanical debridement of nails 1-5  bilaterally performed with a nail nipper.  Filed with dremel without incident.    Return office visit   10 weeks                   Told patient to return for periodic foot care and evaluation due to potential at risk complications.   Nyiah Pianka DPM  

## 2020-04-22 DIAGNOSIS — E782 Mixed hyperlipidemia: Secondary | ICD-10-CM | POA: Diagnosis not present

## 2020-04-22 DIAGNOSIS — I10 Essential (primary) hypertension: Secondary | ICD-10-CM | POA: Diagnosis not present

## 2020-04-22 DIAGNOSIS — R7303 Prediabetes: Secondary | ICD-10-CM | POA: Diagnosis not present

## 2020-04-28 DIAGNOSIS — L821 Other seborrheic keratosis: Secondary | ICD-10-CM | POA: Diagnosis not present

## 2020-04-28 DIAGNOSIS — L720 Epidermal cyst: Secondary | ICD-10-CM | POA: Diagnosis not present

## 2020-04-28 DIAGNOSIS — Z85828 Personal history of other malignant neoplasm of skin: Secondary | ICD-10-CM | POA: Diagnosis not present

## 2020-04-28 DIAGNOSIS — D225 Melanocytic nevi of trunk: Secondary | ICD-10-CM | POA: Diagnosis not present

## 2020-04-28 DIAGNOSIS — D692 Other nonthrombocytopenic purpura: Secondary | ICD-10-CM | POA: Diagnosis not present

## 2020-04-29 DIAGNOSIS — I779 Disorder of arteries and arterioles, unspecified: Secondary | ICD-10-CM | POA: Diagnosis not present

## 2020-04-29 DIAGNOSIS — Z0000 Encounter for general adult medical examination without abnormal findings: Secondary | ICD-10-CM | POA: Diagnosis not present

## 2020-04-29 DIAGNOSIS — I10 Essential (primary) hypertension: Secondary | ICD-10-CM | POA: Diagnosis not present

## 2020-04-29 DIAGNOSIS — R5383 Other fatigue: Secondary | ICD-10-CM | POA: Diagnosis not present

## 2020-04-29 DIAGNOSIS — R7303 Prediabetes: Secondary | ICD-10-CM | POA: Diagnosis not present

## 2020-04-29 DIAGNOSIS — N182 Chronic kidney disease, stage 2 (mild): Secondary | ICD-10-CM | POA: Diagnosis not present

## 2020-05-17 ENCOUNTER — Encounter (HOSPITAL_COMMUNITY): Payer: Self-pay | Admitting: Cardiology

## 2020-05-17 ENCOUNTER — Ambulatory Visit (HOSPITAL_COMMUNITY)
Admission: RE | Admit: 2020-05-17 | Discharge: 2020-05-17 | Disposition: A | Discharge status: Home / Self Care | Payer: Medicare Other | Source: Ambulatory Visit | Attending: Cardiology | Admitting: Cardiology

## 2020-05-17 ENCOUNTER — Other Ambulatory Visit: Payer: Self-pay

## 2020-05-17 VITALS — BP 124/70 | HR 68 | Wt 174.0 lb

## 2020-05-17 DIAGNOSIS — I714 Abdominal aortic aneurysm, without rupture: Secondary | ICD-10-CM | POA: Insufficient documentation

## 2020-05-17 DIAGNOSIS — Z955 Presence of coronary angioplasty implant and graft: Secondary | ICD-10-CM | POA: Diagnosis not present

## 2020-05-17 DIAGNOSIS — I2582 Chronic total occlusion of coronary artery: Secondary | ICD-10-CM | POA: Insufficient documentation

## 2020-05-17 DIAGNOSIS — I252 Old myocardial infarction: Secondary | ICD-10-CM | POA: Insufficient documentation

## 2020-05-17 DIAGNOSIS — N189 Chronic kidney disease, unspecified: Secondary | ICD-10-CM | POA: Insufficient documentation

## 2020-05-17 DIAGNOSIS — Z87891 Personal history of nicotine dependence: Secondary | ICD-10-CM | POA: Diagnosis not present

## 2020-05-17 DIAGNOSIS — I5022 Chronic systolic (congestive) heart failure: Secondary | ICD-10-CM

## 2020-05-17 DIAGNOSIS — I255 Ischemic cardiomyopathy: Secondary | ICD-10-CM | POA: Diagnosis not present

## 2020-05-17 DIAGNOSIS — Z950 Presence of cardiac pacemaker: Secondary | ICD-10-CM | POA: Diagnosis not present

## 2020-05-17 DIAGNOSIS — I7300 Raynaud's syndrome without gangrene: Secondary | ICD-10-CM | POA: Insufficient documentation

## 2020-05-17 DIAGNOSIS — Z79899 Other long term (current) drug therapy: Secondary | ICD-10-CM | POA: Diagnosis not present

## 2020-05-17 DIAGNOSIS — I5042 Chronic combined systolic (congestive) and diastolic (congestive) heart failure: Secondary | ICD-10-CM | POA: Diagnosis not present

## 2020-05-17 DIAGNOSIS — I2510 Atherosclerotic heart disease of native coronary artery without angina pectoris: Secondary | ICD-10-CM | POA: Diagnosis not present

## 2020-05-17 DIAGNOSIS — Z7982 Long term (current) use of aspirin: Secondary | ICD-10-CM | POA: Insufficient documentation

## 2020-05-17 DIAGNOSIS — Z7902 Long term (current) use of antithrombotics/antiplatelets: Secondary | ICD-10-CM | POA: Insufficient documentation

## 2020-05-17 DIAGNOSIS — I447 Left bundle-branch block, unspecified: Secondary | ICD-10-CM | POA: Insufficient documentation

## 2020-05-17 DIAGNOSIS — Z8249 Family history of ischemic heart disease and other diseases of the circulatory system: Secondary | ICD-10-CM | POA: Diagnosis not present

## 2020-05-17 DIAGNOSIS — Z791 Long term (current) use of non-steroidal anti-inflammatories (NSAID): Secondary | ICD-10-CM | POA: Insufficient documentation

## 2020-05-17 LAB — BASIC METABOLIC PANEL
Anion gap: 11 (ref 5–15)
BUN: 21 mg/dL (ref 8–23)
CO2: 24 mmol/L (ref 22–32)
Calcium: 9.3 mg/dL (ref 8.9–10.3)
Chloride: 100 mmol/L (ref 98–111)
Creatinine, Ser: 1.03 mg/dL (ref 0.61–1.24)
GFR calc Af Amer: >60 mL/min (ref >60)
GFR calc non Af Amer: >60 mL/min (ref >60)
Glucose, Bld: 119 mg/dL — ABNORMAL HIGH (ref 70–99)
Potassium: 3.9 mmol/L (ref 3.5–5.1)
Sodium: 135 mmol/L (ref 135–145)

## 2020-05-17 LAB — LIPID PANEL
Cholesterol: 121 mg/dL (ref 0–200)
HDL: 53 mg/dL (ref >40)
LDL Cholesterol: 58 mg/dL (ref 0–99)
Total CHOL/HDL Ratio: 2.3 RATIO
Triglycerides: 48 mg/dL (ref <150)
VLDL: 10 mg/dL (ref 0–40)

## 2020-05-17 MED ORDER — FUROSEMIDE 20 MG PO TABS: 20.0000 mg | ORAL_TABLET | ORAL | 6 refills | Status: DC

## 2020-05-17 MED ORDER — CARVEDILOL 6.25 MG PO TABS: 6.2500 mg | ORAL_TABLET | Freq: Two times a day (BID) | ORAL | 5 refills | Status: DC

## 2020-05-17 NOTE — Progress Notes (Signed)
Patient ID: Henry Neal, male   DOB: 23-Dec-1926, 86 y.o.   MRN: [id number]      [facility] Note  PCP: Dr. 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 male with history of CAD, chronic systolic CHF, LBBB, AAA s/p repair presents for followup of CHF and CAD.  He had a cardiac cath in 12/15, showing total occlusion of the LAD and 50-60% proximal RCA.  EF at that time was near-normal.  In 10/16, he developed dyspnea and epigastric pain.  He was noted to be in acute pulmonary edema and was diuresed.  Troponin was only mildly elevated at 0.47.  He ultimately underwent LHC showing hazy 80-90% RCA stenosis which was likely the culprit for his presentation.  Additionally, the LAD was only subtotally occluded on this cath.  He had DES to RCA.  Cardiac MRI was done, showing EF 22% with significant viability in LAD territory.  Therefore, CTO of LAD was opened with DES.  Echo in 2/17 showed EF persistently low at 15%.  He had St Jude CRT-P placement in 4/17. Echo in 1/19 showed EF up to 45-50% with mild MR.    Echo in 3/20 showed EF 45% with mild diffuse hypokinesis, mildly decreased RV systolic function. Echo in 3/21 showed EF 45-50%, mild RV dilation with normal systolic function.   He has been doing well currently.  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  He wears his compression stockings daily and reports more ankle swelling recently.  No chest pain.  No lightheadedness.  No significant exertional dyspnea.  No orthopnea/PND.   Weight is up about 4 lbs.   St Jude device interrogation: >99% BiV pacing, no atrial fibrillation, thoracic impedance recently decreased but now back to normal.    Labs (10/16): hgb 9.7, K 4.6, creatinine 1.12 Labs (11/16): K 5.9, creatinine 1.5 Labs (12/16); K 4.2, creatinine 1.07 => 1.02, BNP 448 Labs (1/17): K 4.1, creatinine 1.13, BNP 423, LDL 40, HDL 47 Labs (4/17): K 3.9, creatinine 1.03, HCT 32.6 Labs (5/17): K 4.4, creatinine  1.09 Labs (1/18): LDL 37 Labs (01/25/2017): K 3.5 Creatinine 0.96  Labs (8/18): BNP 30, K 3.7, creatinine 1.0 Labs (11/18): LDL 40, HDL 47, K 3.5, creatinine 0.96  Labs (3/19): K 4, creatinine 0.97 Labs (11/19): K 3.9, creatinine 1.2, LDL 44 Labs (4/20): K 4.1, creatinine 1.11 Labs (10/20): LDL 46 Labs (11/20): K 4.4, creatinine 1.05 Labs (3/21): K 4.2, creatinine 0.92  PMH: 1. CKD 2. AAA s/p repair, followed at [facility]. 3. Carotid stenosis: s/p right CEA.   Carotid dopplers (2/16) with right CEA stable, < 18% LICA stenosis.  Followed at [facility].  - Carotid dopplers (8/41): 6-60% LICA, patent right CEA.  4. Chronic LBBB 5. LAD: LHC (12/15) with totally occluded LAD, 50-60% pRCA => medically managed.  10/16 admitted with NSTEMI.  LHC with subtotalled LAD occlusion, 80-90% pRCA with thrombus => DES RCA initially.  Cardiac MRI was done showing viability in the LAD territory, so he had DES to the CTO LAD.   6. Chronic systolic CHF: Ischemic cardiomyopathy.  Echo (10/16) with EF 15% with wall motion abnormalities, moderately dilated RV with normal systolic function.  CMRI (10/16) with severe LV dilation, EF 22% with septal-lateral dyssynchrony, no LV thrombus, normal RV size/systolic function => significant viability noted in the LAD territory.  Echo (2/17): EF 15%, severe LV dilation, wall motion abnormalities noted, mild MR, mildly dilated RV with normal systolic function, PASP  55 mmHg.  St Jude CRT-P 4/17.  - Echo (1/18): EF 50-55%, mild to moderate MR.  - Echo (1/19): EF 45-50%, mild LVH, inferior hypokinesis, mild MR, PASP 30 mmHg.  - Echo (3/20): EF 45%, diffuse hypokinesis, normal RV size with mildly decreased systolic function.  - Echo (3/21): EF 45-50%, mildly dilated RV with normal systolic function.  7. ABIs (10/16) were normal.  8. Raynaud's syndrome.  9. Sciatica  SH: Lives alone, prior smoker, still working as a Furniture conservator/restorer.  FH: CAD  Review of systems complete and found to be negative  unless listed in HPI.    Current Outpatient Medications  Medication Sig Dispense Refill  . acetaminophen (TYLENOL) 500 MG tablet Take 1,000 mg by mouth daily as needed for mild pain or headache. Reported on 01/25/2016    . aspirin EC 81 MG tablet Take 81 mg by mouth daily.      Marland Kitchen atorvastatin (LIPITOR) 40 MG tablet Take 1 tablet (40 mg total) by mouth daily at 6 PM. 30 tablet 6  . Calcium Carbonate-Vitamin D (CALCIUM 600 + D PO) Take 1 tablet by mouth 2 (two) times daily.     . carvedilol (COREG) 6.25 MG tablet Take 1 tablet (6.25 mg total) by mouth 2 (two) times daily. 60 tablet 5  . clopidogrel (PLAVIX) 75 MG tablet TAKE 1 TABLET BY MOUTH EVERY DAY WITH BREAKFAST 90 tablet 3  . clotrimazole-betamethasone (LOTRISONE) cream APPLY TO AA BID  1  . finasteride (PROSCAR) 5 MG tablet take 1 tablet by mouth once daily    . fluticasone (FLONASE) 50 MCG/ACT nasal spray Place 1 spray into both nostrils daily as needed for allergies.     . furosemide (LASIX) 20 MG tablet Take 1 tablet (20 mg total) by mouth every other day. 15 tablet 6  . gabapentin (NEURONTIN) 300 MG capsule Take 300 mg by mouth 2 (two) times daily.  0  . meclizine (ANTIVERT) 25 MG tablet Take 1 tablet (25 mg total) by mouth 2 (two) times daily as needed for dizziness. 30 tablet 0  . meloxicam (MOBIC) 15 MG tablet Take 15 mg by mouth daily as needed for pain.  0  . mometasone (ELOCON) 0.1 % cream Apply 1 application topically daily as needed (for irritation).     . Multiple Vitamin (MULTIVITAMIN PO) Take 1 tablet by mouth daily. ALIVE men's vitamin    . nitroGLYCERIN (NITROSTAT) 0.4 MG SL tablet PLACE 1 TABLET UNDER THE TONGUE IF NEEDED EVERY 5 MINUTES FOR CHEST PAIN FOR 3 DOSES IF NO RELIEF AFTER FIRST DOSE CALL PRESCRIBER OR 911 25 tablet 5  . ondansetron (ZOFRAN) 4 MG tablet Take 4 mg by mouth 3 (three) times daily as needed.    . pantoprazole (PROTONIX) 40 MG tablet Take 40 mg by mouth 2 (two) times daily.   0  . polyethylene glycol  (MIRALAX / GLYCOLAX) packet Take 17 g by mouth daily as needed for mild constipation.     . sacubitril-valsartan (ENTRESTO) 24-26 MG Take 1 tablet by mouth 2 (two) times daily. 60 tablet 4  . spironolactone (ALDACTONE) 25 MG tablet Take 1 tablet (25 mg total) by mouth daily. 90 tablet 3  . sulfaSALAzine (AZULFIDINE) 500 MG tablet Take 500 mg by mouth 2 (two) times daily.      No current facility-administered medications for this encounter.   BP 124/70   Pulse 68   Wt 78.9 kg (174 lb)   SpO2 96%   BMI 24.97 kg/m  Wt Readings from Last 3 Encounters:  05/17/20 78.9 kg (174 lb)  12/21/19 77.2 kg (170 lb 3.2 oz)  08/21/19 79.2 kg (174 lb 9.6 oz)    General: NAD Neck: No JVD, no thyromegaly or thyroid nodule.  Lungs: Clear to auscultation bilaterally with normal respiratory effort. CV: Nondisplaced PMI.  Heart regular S1/S2, no S3/S4, no murmur.  Trace ankle edema.  No carotid bruit.  Normal pedal pulses.  Abdomen: Soft, nontender, no hepatosplenomegaly, no distention.  Skin: Intact without lesions or rashes.  Neurologic: Alert and oriented x 3.  Psych: Normal affect. Extremities: No clubbing or cyanosis.  HEENT: Normal.   Assessment/Plan: 1. CAD: NSTEMI with DES to RCA (culprit vessel) and subsequent DES to CTO of LAD (cMRI showed viability in the LAD distribution) in 10/16. No chest pain.   - Continue ASA 81 and Plavix 75.  - atorvastatin 40 daily, check lipids today.  2. Chronic systolic/diastolic heart failure: Ischemic cardiomyopathy. Echo in 3/21 showed EF 45-50%, improved since getting CRT.  St Jude CRT-D.  NYHA class II.  Volume status looks ok on exam.    - With weight gain and more edema, he will change Lasix to 20 mg every other day.  BMET today and 10 days.  - Increase Coreg to 6.25 mg bid.  - Continue spironolactone 25 mg daily.   - Continue Entresto 24/26 mg BID.  - Refer to Cardiac Rehab maintenance program.  3. Carotid stenosis:  - Follows at [facility].  Followup in 4  months  Loralie Champagne, MD  05/17/2020

## 2020-05-17 NOTE — Patient Instructions (Signed)
INCREASE Coreg to 6.25mg  (1 tab) twice a day  TAKE Lasix 20mg  (1 tab) every other day  Labs today We will only contact you if something comes back abnormal or we need to make some changes. Otherwise no news is good news!  Repeat labs in 10 days  You have been referred to Cardiac Rehab maintenance referral  Your physician recommends that you schedule a follow-up appointment in: 4 months with Dr Aundra Dubin  Please call office at [phone] option 2 if you have any questions or concerns.   At the [facility], you and your health needs are our priority. As part of our continuing mission to provide you with exceptional heart care, we have created designated Provider Care Teams. These Care Teams include your primary Cardiologist (physician) and Advanced Practice Providers (APPs- Physician Assistants and Nurse Practitioners) who all work together to provide you with the care you need, when you need it.   You may see any of the following providers on your designated Care Team at your next follow up: Marland Kitchen Dr Glori Bickers . Dr Loralie Champagne . Darrick Grinder, NP . Lyda Jester, PA . Audry Riles, PharmD   Please be sure to bring in all your medications bottles to every appointment.

## 2020-05-24 ENCOUNTER — Encounter (HOSPITAL_COMMUNITY)
Admission: RE | Admit: 2020-05-24 | Discharge: 2020-05-24 | Disposition: A | Discharge status: Home / Self Care | Payer: Self-pay | Source: Ambulatory Visit | Attending: Cardiology | Admitting: Cardiology

## 2020-05-24 ENCOUNTER — Other Ambulatory Visit: Payer: Self-pay

## 2020-05-24 DIAGNOSIS — I5022 Chronic systolic (congestive) heart failure: Secondary | ICD-10-CM | POA: Insufficient documentation

## 2020-05-25 ENCOUNTER — Other Ambulatory Visit (HOSPITAL_COMMUNITY): Payer: Self-pay | Admitting: Cardiology

## 2020-05-26 ENCOUNTER — Other Ambulatory Visit (HOSPITAL_COMMUNITY): Payer: Medicare Other

## 2020-05-26 ENCOUNTER — Encounter (HOSPITAL_COMMUNITY)
Admission: RE | Admit: 2020-05-26 | Discharge: 2020-05-26 | Disposition: A | Discharge status: Home / Self Care | Payer: Self-pay | Source: Ambulatory Visit | Attending: Cardiology | Admitting: Cardiology

## 2020-05-26 ENCOUNTER — Other Ambulatory Visit: Payer: Self-pay

## 2020-05-28 ENCOUNTER — Other Ambulatory Visit (HOSPITAL_COMMUNITY): Payer: Self-pay | Admitting: Cardiology

## 2020-05-30 ENCOUNTER — Other Ambulatory Visit: Payer: Self-pay

## 2020-05-30 ENCOUNTER — Ambulatory Visit (HOSPITAL_COMMUNITY)
Admission: RE | Admit: 2020-05-30 | Discharge: 2020-05-30 | Disposition: A | Discharge status: Home / Self Care | Payer: Medicare Other | Source: Ambulatory Visit | Attending: Cardiology | Admitting: Cardiology

## 2020-05-30 DIAGNOSIS — I5022 Chronic systolic (congestive) heart failure: Secondary | ICD-10-CM | POA: Insufficient documentation

## 2020-05-30 LAB — BASIC METABOLIC PANEL
Anion gap: 11 (ref 5–15)
BUN: 13 mg/dL (ref 8–23)
CO2: 25 mmol/L (ref 22–32)
Calcium: 8.9 mg/dL (ref 8.9–10.3)
Chloride: 98 mmol/L (ref 98–111)
Creatinine, Ser: 1.27 mg/dL — ABNORMAL HIGH (ref 0.61–1.24)
GFR calc Af Amer: 57 mL/min — ABNORMAL LOW (ref >60)
GFR calc non Af Amer: 49 mL/min — ABNORMAL LOW (ref >60)
Glucose, Bld: 128 mg/dL — ABNORMAL HIGH (ref 70–99)
Potassium: 4 mmol/L (ref 3.5–5.1)
Sodium: 134 mmol/L — ABNORMAL LOW (ref 135–145)

## 2020-05-31 ENCOUNTER — Encounter (HOSPITAL_COMMUNITY)
Admission: RE | Admit: 2020-05-31 | Discharge: 2020-05-31 | Disposition: A | Discharge status: Home / Self Care | Payer: Self-pay | Source: Ambulatory Visit | Attending: Cardiology | Admitting: Cardiology

## 2020-06-02 ENCOUNTER — Encounter (HOSPITAL_COMMUNITY)
Admission: RE | Admit: 2020-06-02 | Discharge: 2020-06-02 | Disposition: A | Discharge status: Home / Self Care | Payer: Self-pay | Source: Ambulatory Visit | Attending: Cardiology | Admitting: Cardiology

## 2020-06-02 ENCOUNTER — Other Ambulatory Visit: Payer: Self-pay

## 2020-06-07 ENCOUNTER — Other Ambulatory Visit: Payer: Self-pay

## 2020-06-07 ENCOUNTER — Encounter (HOSPITAL_COMMUNITY)
Admission: RE | Admit: 2020-06-07 | Discharge: 2020-06-07 | Disposition: A | Discharge status: Home / Self Care | Payer: Self-pay | Source: Ambulatory Visit | Attending: Cardiology | Admitting: Cardiology

## 2020-06-09 ENCOUNTER — Other Ambulatory Visit: Payer: Self-pay

## 2020-06-09 ENCOUNTER — Encounter (HOSPITAL_COMMUNITY)
Admission: RE | Admit: 2020-06-09 | Discharge: 2020-06-09 | Disposition: A | Discharge status: Home / Self Care | Payer: Self-pay | Source: Ambulatory Visit | Attending: Cardiology | Admitting: Cardiology

## 2020-06-10 ENCOUNTER — Ambulatory Visit (INDEPENDENT_AMBULATORY_CARE_PROVIDER_SITE_OTHER): Payer: Medicare Other | Admitting: *Deleted

## 2020-06-10 DIAGNOSIS — I441 Atrioventricular block, second degree: Secondary | ICD-10-CM | POA: Diagnosis not present

## 2020-06-10 LAB — CUP PACEART REMOTE DEVICE CHECK
Battery Remaining Longevity: 114 mo
Battery Remaining Percentage: 95.5 %
Battery Voltage: 2.98 V
Brady Statistic AP VP Percent: 4.8 %
Brady Statistic AP VS Percent: 1 %
Brady Statistic AS VP Percent: 95 %
Brady Statistic AS VS Percent: 1 %
Brady Statistic RA Percent Paced: 4.4 %
Date Time Interrogation Session: 20210820102408
Implantable Lead Implant Date: 20170406
Implantable Lead Implant Date: 20170406
Implantable Lead Implant Date: 20170406
Implantable Lead Location: 753858
Implantable Lead Location: 753859
Implantable Lead Location: 753860
Implantable Pulse Generator Implant Date: 20170406
Lead Channel Impedance Value: 460 Ohm
Lead Channel Impedance Value: 490 Ohm
Lead Channel Impedance Value: 810 Ohm
Lead Channel Pacing Threshold Amplitude: 0.5 V
Lead Channel Pacing Threshold Amplitude: 1.125 V
Lead Channel Pacing Threshold Amplitude: 1.125 V
Lead Channel Pacing Threshold Pulse Width: 0.4 ms
Lead Channel Pacing Threshold Pulse Width: 0.4 ms
Lead Channel Pacing Threshold Pulse Width: 0.4 ms
Lead Channel Sensing Intrinsic Amplitude: 12 mV
Lead Channel Sensing Intrinsic Amplitude: 2.5 mV
Lead Channel Setting Pacing Amplitude: 2 V
Lead Channel Setting Pacing Amplitude: 2.125
Lead Channel Setting Pacing Amplitude: 2.125
Lead Channel Setting Pacing Pulse Width: 0.4 ms
Lead Channel Setting Pacing Pulse Width: 0.4 ms
Lead Channel Setting Sensing Sensitivity: 2 mV
Pulse Gen Model: 3262
Pulse Gen Serial Number: 7866838

## 2020-06-13 NOTE — Progress Notes (Signed)
Remote pacemaker transmission.   

## 2020-06-14 ENCOUNTER — Encounter (HOSPITAL_COMMUNITY)
Admission: RE | Admit: 2020-06-14 | Discharge: 2020-06-14 | Disposition: A | Discharge status: Home / Self Care | Payer: Self-pay | Source: Ambulatory Visit | Attending: Cardiology | Admitting: Cardiology

## 2020-06-14 ENCOUNTER — Other Ambulatory Visit: Payer: Self-pay

## 2020-06-16 ENCOUNTER — Encounter (HOSPITAL_COMMUNITY): Payer: Self-pay

## 2020-06-17 DIAGNOSIS — M25569 Pain in unspecified knee: Secondary | ICD-10-CM | POA: Diagnosis not present

## 2020-06-17 DIAGNOSIS — M170 Bilateral primary osteoarthritis of knee: Secondary | ICD-10-CM | POA: Diagnosis not present

## 2020-06-17 DIAGNOSIS — M25461 Effusion, right knee: Secondary | ICD-10-CM | POA: Diagnosis not present

## 2020-06-17 DIAGNOSIS — M159 Polyosteoarthritis, unspecified: Secondary | ICD-10-CM | POA: Diagnosis not present

## 2020-06-21 ENCOUNTER — Encounter (HOSPITAL_COMMUNITY)
Admission: RE | Admit: 2020-06-21 | Discharge: 2020-06-21 | Disposition: A | Discharge status: Home / Self Care | Payer: Self-pay | Source: Ambulatory Visit | Attending: Cardiology | Admitting: Cardiology

## 2020-06-21 ENCOUNTER — Other Ambulatory Visit: Payer: Self-pay

## 2020-06-23 ENCOUNTER — Other Ambulatory Visit: Payer: Self-pay

## 2020-06-23 ENCOUNTER — Encounter (HOSPITAL_COMMUNITY)
Admission: RE | Admit: 2020-06-23 | Discharge: 2020-06-23 | Disposition: A | Discharge status: Home / Self Care | Payer: Self-pay | Source: Ambulatory Visit | Attending: Cardiology | Admitting: Cardiology

## 2020-06-23 DIAGNOSIS — I5022 Chronic systolic (congestive) heart failure: Secondary | ICD-10-CM | POA: Insufficient documentation

## 2020-06-28 ENCOUNTER — Encounter (HOSPITAL_COMMUNITY)
Admission: RE | Admit: 2020-06-28 | Discharge: 2020-06-28 | Disposition: A | Discharge status: Home / Self Care | Payer: Self-pay | Source: Ambulatory Visit | Attending: Cardiology | Admitting: Cardiology

## 2020-06-28 ENCOUNTER — Other Ambulatory Visit: Payer: Self-pay

## 2020-06-30 ENCOUNTER — Other Ambulatory Visit: Payer: Self-pay

## 2020-06-30 ENCOUNTER — Encounter (HOSPITAL_COMMUNITY)
Admission: RE | Admit: 2020-06-30 | Discharge: 2020-06-30 | Disposition: A | Discharge status: Home / Self Care | Payer: Self-pay | Source: Ambulatory Visit | Attending: Cardiology | Admitting: Cardiology

## 2020-07-05 ENCOUNTER — Other Ambulatory Visit: Payer: Self-pay

## 2020-07-05 ENCOUNTER — Encounter (HOSPITAL_COMMUNITY)
Admission: RE | Admit: 2020-07-05 | Discharge: 2020-07-05 | Disposition: A | Discharge status: Home / Self Care | Payer: Self-pay | Source: Ambulatory Visit | Attending: Cardiology | Admitting: Cardiology

## 2020-07-07 ENCOUNTER — Encounter (HOSPITAL_COMMUNITY)
Admission: RE | Admit: 2020-07-07 | Discharge: 2020-07-07 | Disposition: A | Discharge status: Home / Self Care | Payer: Self-pay | Source: Ambulatory Visit | Attending: Cardiology | Admitting: Cardiology

## 2020-07-07 ENCOUNTER — Other Ambulatory Visit: Payer: Self-pay

## 2020-07-12 ENCOUNTER — Encounter (HOSPITAL_COMMUNITY)
Admission: RE | Admit: 2020-07-12 | Discharge: 2020-07-12 | Disposition: A | Discharge status: Home / Self Care | Payer: Self-pay | Source: Ambulatory Visit | Attending: Cardiology | Admitting: Cardiology

## 2020-07-12 ENCOUNTER — Other Ambulatory Visit: Payer: Self-pay

## 2020-07-14 ENCOUNTER — Other Ambulatory Visit: Payer: Self-pay

## 2020-07-14 ENCOUNTER — Encounter (HOSPITAL_COMMUNITY)
Admission: RE | Admit: 2020-07-14 | Discharge: 2020-07-14 | Disposition: A | Discharge status: Home / Self Care | Payer: Self-pay | Source: Ambulatory Visit | Attending: Cardiology | Admitting: Cardiology

## 2020-07-19 ENCOUNTER — Other Ambulatory Visit: Payer: Self-pay

## 2020-07-19 ENCOUNTER — Encounter (HOSPITAL_COMMUNITY)
Admission: RE | Admit: 2020-07-19 | Discharge: 2020-07-19 | Disposition: A | Discharge status: Home / Self Care | Payer: Self-pay | Source: Ambulatory Visit | Attending: Cardiology | Admitting: Cardiology

## 2020-07-21 ENCOUNTER — Other Ambulatory Visit: Payer: Self-pay

## 2020-07-21 ENCOUNTER — Encounter (HOSPITAL_COMMUNITY)
Admission: RE | Admit: 2020-07-21 | Discharge: 2020-07-21 | Disposition: A | Discharge status: Home / Self Care | Payer: Self-pay | Source: Ambulatory Visit | Attending: Cardiology | Admitting: Cardiology

## 2020-07-21 DIAGNOSIS — H1132 Conjunctival hemorrhage, left eye: Secondary | ICD-10-CM | POA: Diagnosis not present

## 2020-07-22 ENCOUNTER — Ambulatory Visit (INDEPENDENT_AMBULATORY_CARE_PROVIDER_SITE_OTHER): Payer: Medicare Other | Admitting: Podiatry

## 2020-07-22 ENCOUNTER — Encounter: Payer: Self-pay | Admitting: Podiatry

## 2020-07-22 DIAGNOSIS — M79676 Pain in unspecified toe(s): Secondary | ICD-10-CM | POA: Diagnosis not present

## 2020-07-22 DIAGNOSIS — B351 Tinea unguium: Secondary | ICD-10-CM | POA: Diagnosis not present

## 2020-07-22 DIAGNOSIS — I739 Peripheral vascular disease, unspecified: Secondary | ICD-10-CM

## 2020-07-22 DIAGNOSIS — D689 Coagulation defect, unspecified: Secondary | ICD-10-CM | POA: Diagnosis not present

## 2020-07-22 NOTE — Progress Notes (Signed)
This patient returns to my office for at risk foot care.  This patient requires this care by a professional since this patient will be at risk due to having renal insufficiency coagulation defect and PAD.   Patient is taking plavix.    This patient is unable to cut nails himself since the patient cannot reach his nails.These nails are painful walking and wearing shoes.  This patient presents for at risk foot care today.  General Appearance  Alert, conversant and in no acute stress.  Vascular  Dorsalis pedis and posterior tibial  pulses are weakly  palpable  bilaterally.  Capillary return is within normal limits  bilaterally. Temperature is within normal limits  bilaterally.  Neurologic  Senn-Weinstein monofilament wire test within normal limits  bilaterally. Muscle power within normal limits bilaterally.  Nails Thick disfigured discolored nails with subungual debris  from hallux to fifth toes bilaterally. No evidence of bacterial infection or drainage bilaterally.  Orthopedic  No limitations of motion  feet .  No crepitus or effusions noted.  No bony pathology or digital deformities noted.  Skin  normotropic skin with no porokeratosis noted bilaterally.  No signs of infections or ulcers noted.     Onychomycosis  Pain in right toes  Pain in left toes  Consent was obtained for treatment procedures.   Mechanical debridement of nails 1-5  bilaterally performed with a nail nipper.  Filed with dremel without incident.    Return office visit   10 weeks                   Told patient to return for periodic foot care and evaluation due to potential at risk complications.   Aleah Ahlgrim DPM  

## 2020-07-26 ENCOUNTER — Other Ambulatory Visit: Payer: Self-pay

## 2020-07-26 ENCOUNTER — Encounter (HOSPITAL_COMMUNITY)
Admission: RE | Admit: 2020-07-26 | Discharge: 2020-07-26 | Disposition: A | Discharge status: Home / Self Care | Payer: Self-pay | Source: Ambulatory Visit | Attending: Cardiology | Admitting: Cardiology

## 2020-07-26 DIAGNOSIS — I5022 Chronic systolic (congestive) heart failure: Secondary | ICD-10-CM | POA: Insufficient documentation

## 2020-07-28 ENCOUNTER — Encounter (HOSPITAL_COMMUNITY)
Admission: RE | Admit: 2020-07-28 | Discharge: 2020-07-28 | Disposition: A | Discharge status: Home / Self Care | Payer: Self-pay | Source: Ambulatory Visit | Attending: Cardiology | Admitting: Cardiology

## 2020-07-28 ENCOUNTER — Other Ambulatory Visit: Payer: Self-pay

## 2020-08-01 ENCOUNTER — Telehealth: Payer: Self-pay | Admitting: Internal Medicine

## 2020-08-01 NOTE — Telephone Encounter (Signed)
  1. Has your device fired? no  2. Is you device beeping? no  3. Are you experiencing draining or swelling at device site? no  4. Are you calling to see if we received your device transmission? no  5. Have you passed out? no   Patient states the area around his device is sore.    Please route to [place]

## 2020-08-01 NOTE — Telephone Encounter (Signed)
Returning patients phone call. Patient reports area around device site has been sore the past 2-3 day. Denies any injury, redness, swelling or open areas with drainage. Reports tenderness to touch. Has not taken anything for pain. Advised patient to tylenol 3 times a day for several days. Advised patient if pain has not improved to call the device clinic. Verbalized understanding.

## 2020-08-02 ENCOUNTER — Other Ambulatory Visit: Payer: Self-pay

## 2020-08-02 ENCOUNTER — Encounter (HOSPITAL_COMMUNITY)
Admission: RE | Admit: 2020-08-02 | Discharge: 2020-08-02 | Disposition: A | Discharge status: Home / Self Care | Payer: Self-pay | Source: Ambulatory Visit | Attending: Cardiology | Admitting: Cardiology

## 2020-08-04 ENCOUNTER — Other Ambulatory Visit: Payer: Self-pay

## 2020-08-04 ENCOUNTER — Encounter (HOSPITAL_COMMUNITY)
Admission: RE | Admit: 2020-08-04 | Discharge: 2020-08-04 | Disposition: A | Discharge status: Home / Self Care | Payer: Self-pay | Source: Ambulatory Visit | Attending: Cardiology | Admitting: Cardiology

## 2020-08-05 DIAGNOSIS — Z23 Encounter for immunization: Secondary | ICD-10-CM | POA: Diagnosis not present

## 2020-08-09 ENCOUNTER — Other Ambulatory Visit: Payer: Self-pay

## 2020-08-09 ENCOUNTER — Encounter (HOSPITAL_COMMUNITY)
Admission: RE | Admit: 2020-08-09 | Discharge: 2020-08-09 | Disposition: A | Discharge status: Home / Self Care | Payer: Self-pay | Source: Ambulatory Visit | Attending: Cardiology | Admitting: Cardiology

## 2020-08-11 ENCOUNTER — Encounter (HOSPITAL_COMMUNITY)
Admission: RE | Admit: 2020-08-11 | Discharge: 2020-08-11 | Disposition: A | Discharge status: Home / Self Care | Payer: Self-pay | Source: Ambulatory Visit | Attending: Cardiology | Admitting: Cardiology

## 2020-08-11 ENCOUNTER — Other Ambulatory Visit: Payer: Self-pay

## 2020-08-19 DIAGNOSIS — M25461 Effusion, right knee: Secondary | ICD-10-CM | POA: Diagnosis not present

## 2020-08-19 DIAGNOSIS — M25569 Pain in unspecified knee: Secondary | ICD-10-CM | POA: Diagnosis not present

## 2020-08-19 DIAGNOSIS — M159 Polyosteoarthritis, unspecified: Secondary | ICD-10-CM | POA: Diagnosis not present

## 2020-08-19 DIAGNOSIS — M170 Bilateral primary osteoarthritis of knee: Secondary | ICD-10-CM | POA: Diagnosis not present

## 2020-09-06 ENCOUNTER — Encounter: Payer: Self-pay | Admitting: Internal Medicine

## 2020-09-06 ENCOUNTER — Other Ambulatory Visit: Payer: Self-pay

## 2020-09-06 ENCOUNTER — Ambulatory Visit: Payer: Medicare Other | Admitting: Internal Medicine

## 2020-09-06 VITALS — BP 124/60 | HR 69 | Ht 70.0 in | Wt 170.8 lb

## 2020-09-06 DIAGNOSIS — I5022 Chronic systolic (congestive) heart failure: Secondary | ICD-10-CM

## 2020-09-06 DIAGNOSIS — Z950 Presence of cardiac pacemaker: Secondary | ICD-10-CM

## 2020-09-06 LAB — CUP PACEART INCLINIC DEVICE CHECK
Battery Remaining Longevity: 104 mo
Battery Voltage: 2.96 V
Brady Statistic RA Percent Paced: 4.1 %
Brady Statistic RV Percent Paced: 99.93 %
Date Time Interrogation Session: 20211116171600
Implantable Lead Implant Date: 20170406
Implantable Lead Implant Date: 20170406
Implantable Lead Implant Date: 20170406
Implantable Lead Location: 753858
Implantable Lead Location: 753859
Implantable Lead Location: 753860
Implantable Pulse Generator Implant Date: 20170406
Lead Channel Impedance Value: 462.5 Ohm
Lead Channel Impedance Value: 462.5 Ohm
Lead Channel Impedance Value: 800 Ohm
Lead Channel Pacing Threshold Amplitude: 0.5 V
Lead Channel Pacing Threshold Amplitude: 1 V
Lead Channel Pacing Threshold Amplitude: 1.25 V
Lead Channel Pacing Threshold Pulse Width: 0.4 ms
Lead Channel Pacing Threshold Pulse Width: 0.4 ms
Lead Channel Pacing Threshold Pulse Width: 0.4 ms
Lead Channel Sensing Intrinsic Amplitude: 12 mV
Lead Channel Sensing Intrinsic Amplitude: 2.5 mV
Lead Channel Setting Pacing Amplitude: 2 V
Lead Channel Setting Pacing Amplitude: 2 V
Lead Channel Setting Pacing Amplitude: 2.125
Lead Channel Setting Pacing Pulse Width: 0.4 ms
Lead Channel Setting Pacing Pulse Width: 0.4 ms
Lead Channel Setting Sensing Sensitivity: 2 mV
Pulse Gen Model: 3262
Pulse Gen Serial Number: 7866838

## 2020-09-06 NOTE — Progress Notes (Signed)
HPI [name] returns today for followup. He is a pleasant 86 yo man with a h/o chronic systolic heart failure and LBBB who is s/p biv PPM insertion. In the interim, he has done well with no chest pain or sob. No syncope. His EF improved from 15% to 50% with biv pacing. He feels well.  Allergies  Allergen Reactions  . Doxepin Other (See Comments)    hallucinations  hallucinations Hallucinations  hallucinations Hallucinations hallucinations Hallucinations  . Sinequan [Doxepin Hcl] Other (See Comments)    hallucinations  . Verapamil Other (See Comments)    Caused heart to race Caused heart to race  Caused heart to race Caused heart to race  . Procaine Itching and Other (See Comments)    Tingling all over as soon as it was injected  Tingling all over as soon as it was injected Tingling all over as soon as it was injected     Current Outpatient Medications  Medication Sig Dispense Refill  . acetaminophen (TYLENOL) 500 MG tablet Take 1,000 mg by mouth daily as needed for mild pain or headache. Reported on 01/25/2016    . aspirin EC 81 MG tablet Take 81 mg by mouth daily.      Marland Kitchen atorvastatin (LIPITOR) 40 MG tablet Take 1 tablet (40 mg total) by mouth daily at 6 PM. 30 tablet 6  . Calcium Carbonate-Vitamin D (CALCIUM 600 + D PO) Take 1 tablet by mouth 2 (two) times daily.     . carvedilol (COREG) 6.25 MG tablet Take 1 tablet (6.25 mg total) by mouth 2 (two) times daily. 60 tablet 5  . carvedilol (COREG) 6.25 MG tablet Take 1 tablet (6.25 mg total) by mouth 2 (two) times daily with a meal. 60 tablet 2  . clopidogrel (PLAVIX) 75 MG tablet TAKE 1 TABLET BY MOUTH EVERY DAY WITH BREAKFAST 90 tablet 3  . clotrimazole-betamethasone (LOTRISONE) cream APPLY TO AA BID  1  . ENTRESTO 24-26 MG TAKE 1 TABLET BY MOUTH TWICE DAILY 60 tablet 4  . finasteride (PROSCAR) 5 MG tablet take 1 tablet by mouth once daily    . fluticasone (FLONASE) 50 MCG/ACT nasal spray Place 1 spray into both  nostrils daily as needed for allergies.     . furosemide (LASIX) 20 MG tablet Take 1 tablet (20 mg total) by mouth every other day. 15 tablet 6  . gabapentin (NEURONTIN) 300 MG capsule Take 300 mg by mouth 2 (two) times daily.  0  . meclizine (ANTIVERT) 25 MG tablet Take 1 tablet (25 mg total) by mouth 2 (two) times daily as needed for dizziness. 30 tablet 0  . meloxicam (MOBIC) 15 MG tablet Take 15 mg by mouth daily as needed for pain.  0  . mometasone (ELOCON) 0.1 % cream Apply 1 application topically daily as needed (for irritation).     . Multiple Vitamin (MULTIVITAMIN PO) Take 1 tablet by mouth daily. ALIVE men's vitamin    . nitroGLYCERIN (NITROSTAT) 0.4 MG SL tablet PLACE 1 TABLET UNDER THE TONGUE IF NEEDED EVERY 5 MINUTES FOR CHEST PAIN FOR 3 DOSES IF NO RELIEF AFTER FIRST DOSE CALL PRESCRIBER OR 911 25 tablet 5  . ondansetron (ZOFRAN) 4 MG tablet Take 4 mg by mouth 3 (three) times daily as needed.    . pantoprazole (PROTONIX) 40 MG tablet Take 40 mg by mouth 2 (two) times daily.   0  . polyethylene glycol (MIRALAX / GLYCOLAX) packet Take 17 g by mouth  daily as needed for mild constipation.     Marland Kitchen spironolactone (ALDACTONE) 25 MG tablet Take 1 tablet (25 mg total) by mouth daily. 90 tablet 3  . sulfaSALAzine (AZULFIDINE) 500 MG tablet Take 500 mg by mouth 2 (two) times daily.      No current facility-administered medications for this visit.     Past Medical History:  Diagnosis Date  . AAA (abdominal aortic aneurysm) ([place])   . Abnormal stress test   . Allergic rhinitis    uses Flonase daily  . Anginal pain (H[facility])    pain in neck,shoulders,down arms occ  . Arthritis   . Cancer ([place])    skin  . Carotid artery occlusion   . Chronic venous insufficiency   . Coronary artery disease   . Dizziness    takes Antivert daily as needed  . ED (erectile dysfunction)   . GERD (gastroesophageal reflux disease)    takes Omeprazole daily  . Hiatal hernia   . Hx of echocardiogram 04/04/2010     showed a borderline dilatd left ventricle with mild left ventricle hypertrophy and an EF 50-55% doppler suggested diastolic dysfunction, although the pattern is probably normal for an 86 yrs old. The left atrium was indeed mildly dilated at 42 mm, but there are no significant valvular abnormalities.   . Hyperlipidemia   . Hypertension    takes Proscar,Dyazide,Avapro,and Metoprolol daily  . Myocardial infarction [facility]) Oct. 11, 2016   Heart Attack  . Palpitation   . Shortness of breath   . Sigmoid diverticulosis   . T12 compression fracture ([place])   . Thyroid disease     ROS:   All systems reviewed and negative except as noted in the HPI.   Past Surgical History:  Procedure Laterality Date  . ABDOMINAL AORTIC ANEURYSM REPAIR  02/24/2009   AAA Stenting  . CARDIAC CATHETERIZATION N/A 08/04/2015   Procedure: Right/Left Heart Cath and Coronary Angiography;  Surgeon: Larey Dresser, MD;  Location: [place] [facility];  Service: Cardiovascular;  Laterality: N/A;  . CARDIAC CATHETERIZATION N/A 08/04/2015   Procedure: Coronary Stent Intervention;  Surgeon: Burnell Blanks, MD;  Location: [place] [facility];  Service: Cardiovascular;  Laterality: N/A;  . CARDIAC CATHETERIZATION N/A 08/08/2015   Procedure: Coronary Stent Intervention;  Surgeon: Peter M Martinique, MD;  Location: [place] [facility];  Service: Cardiovascular;  Laterality: N/A;  . ENDARTERECTOMY Right 10/27/2013   Procedure: ENDARTERECTOMY CAROTID;  Surgeon: Elam Dutch, MD;  Location: [place];  Service: Vascular;  Laterality: Right;  . EP IMPLANTABLE DEVICE N/A 01/26/2016   Procedure: BiV Pacemaker Insertion CRT-P;  Surgeon: Evans Lance, MD;  Location: [place] [facility];  Service: Cardiovascular;  Laterality: N/A;  . HAND SURGERY Right    tendon, lft 90's  . HERNIA REPAIR Bilateral   . LEFT HEART CATHETERIZATION WITH CORONARY ANGIOGRAM N/A 10/12/2013   Procedure: LEFT HEART CATHETERIZATION WITH CORONARY ANGIOGRAM;   Surgeon: Lorretta Harp, MD;  Location: [facility];  Service: Cardiovascular;  Laterality: N/A;  . melanoma removed    . ROTATOR CUFF REPAIR  2003   left arm     Family History  Problem Relation Age of Onset  . Heart disease Mother        before age 34  . COPD Mother   . Diabetes Mother   . Hyperlipidemia Mother   . Hypertension Mother      Social History   Socioeconomic History  . Marital status: Widowed  Spouse name: Not on file  . Number of children: Not on file  . Years of education: Not on file  . Highest education level: Not on file  Occupational History  . Not on file  Tobacco Use  . Smoking status: Former Smoker    Packs/day: 2.00    Years: 30.00    Pack years: 60.00    Types: Cigarettes    Quit date: 10/22/1977    Years since quitting: 42.9  . Smokeless tobacco: Former Systems developer    Quit date: 10/22/1984  Substance and Sexual Activity  . Alcohol use: Yes    Alcohol/week: 7.0 standard drinks    Types: 7 Cans of beer per week  . Drug use: No  . Sexual activity: Not on file  Other Topics Concern  . Not on file  Social History Narrative  . Not on file   Social Determinants of Health   Financial Resource Strain:   . Difficulty of Paying Living Expenses: Not on file  Food Insecurity:   . Worried About Charity fundraiser in the Last Year: Not on file  . Ran Out of Food in the Last Year: Not on file  Transportation Needs:   . Lack of Transportation (Medical): Not on file  . Lack of Transportation (Non-Medical): Not on file  Physical Activity:   . Days of Exercise per Week: Not on file  . Minutes of Exercise per Session: Not on file  Stress:   . Feeling of Stress : Not on file  Social Connections:   . Frequency of Communication with Friends and Family: Not on file  . Frequency of Social Gatherings with Friends and Family: Not on file  . Attends Religious Services: Not on file  . Active Member of Clubs or Organizations: Not on file  . Attends Theatre manager Meetings: Not on file  . Marital Status: Not on file  Intimate Partner Violence:   . Fear of Current or Ex-Partner: Not on file  . Emotionally Abused: Not on file  . Physically Abused: Not on file  . Sexually Abused: Not on file     BP 124/60   Pulse 69   Ht 5\' 10"  (1.778 m)   Wt 170 lb 12.8 oz (77.5 kg)   SpO2 94%   BMI 24.51 kg/m   Physical Exam:  Well appearing NAD HEENT: Unremarkable Neck:  No JVD, no thyromegally Lymphatics:  No adenopathy Back:  No CVA tenderness Lungs:  Clear with no wheezes HEART:  Regular rate rhythm, no murmurs, no rubs, no clicks Abd:  soft, positive bowel sounds, no organomegally, no rebound, no guarding Ext:  2 plus pulses, no edema, no cyanosis, no clubbing Skin:  No rashes no nodules Neuro:  CN II through XII intact, motor grossly intact  EKG - nsr with biv pacing  DEVICE  Normal device function.  See PaceArt for details.   Assess/Plan: 1. Chronic systolic heart failure - he is doing well with class 2 symptoms and his ef has almost normalized. He will continue guideline directed medical therapy. 2. PPM - his St. Jude Biv PPM is working normally. Thresholds and sensing are excellent. 3. CAD - he is s/p NSTEMI and denies any anginal symptoms. 4. Dyslipidemia - he will continue atorvastatin. LIpids are well controlled.  Carleene Overlie Kiran Carline,MD

## 2020-09-06 NOTE — Patient Instructions (Signed)
Medication Instructions:  Your physician recommends that you continue on your current medications as directed. Please refer to the Current Medication list given to you today.  *If you need a refill on your cardiac medications before your next appointment, please call your pharmacy*   Lab Work: None ordered.  If you have labs (blood work) drawn today and your tests are completely normal, you will receive your results only by: MyChart Message (if you have MyChart) OR A paper copy in the mail If you have any lab test that is abnormal or we need to change your treatment, we will call you to review the results.   Testing/Procedures: None ordered.    Follow-Up: At [facility], you and your health needs are our priority.  As part of our continuing mission to provide you with exceptional heart care, we have created designated Provider Care Teams.  These Care Teams include your primary Cardiologist (physician) and Advanced Practice Providers (APPs -  Physician Assistants and Nurse Practitioners) who all work together to provide you with the care you need, when you need it.  We recommend signing up for the patient portal called "MyChart".  Sign up information is provided on this After Visit Summary.  MyChart is used to connect with patients for Virtual Visits (Telemedicine).  Patients are able to view lab/test results, encounter notes, upcoming appointments, etc.  Non-urgent messages can be sent to your provider as well.   To learn more about what you can do with MyChart, go to [email or web address].    Your next appointment:   12 month(s)  The format for your next appointment:   In Person  Provider:   Gregg Taylor, MD    

## 2020-09-09 ENCOUNTER — Ambulatory Visit (INDEPENDENT_AMBULATORY_CARE_PROVIDER_SITE_OTHER): Payer: Medicare Other

## 2020-09-09 DIAGNOSIS — I441 Atrioventricular block, second degree: Secondary | ICD-10-CM

## 2020-09-10 LAB — CUP PACEART REMOTE DEVICE CHECK
Battery Remaining Longevity: 107 mo
Battery Remaining Percentage: 95.5 %
Battery Voltage: 2.96 V
Brady Statistic AP VP Percent: 3.4 %
Brady Statistic AP VS Percent: 1 %
Brady Statistic AS VP Percent: 97 %
Brady Statistic AS VS Percent: 1 %
Brady Statistic RA Percent Paced: 3.1 %
Date Time Interrogation Session: 20211119122345
Implantable Lead Implant Date: 20170406
Implantable Lead Implant Date: 20170406
Implantable Lead Implant Date: 20170406
Implantable Lead Location: 753858
Implantable Lead Location: 753859
Implantable Lead Location: 753860
Implantable Pulse Generator Implant Date: 20170406
Lead Channel Impedance Value: 460 Ohm
Lead Channel Impedance Value: 480 Ohm
Lead Channel Impedance Value: 800 Ohm
Lead Channel Pacing Threshold Amplitude: 0.5 V
Lead Channel Pacing Threshold Amplitude: 1.125 V
Lead Channel Pacing Threshold Amplitude: 1.25 V
Lead Channel Pacing Threshold Pulse Width: 0.4 ms
Lead Channel Pacing Threshold Pulse Width: 0.4 ms
Lead Channel Pacing Threshold Pulse Width: 0.4 ms
Lead Channel Sensing Intrinsic Amplitude: 12 mV
Lead Channel Sensing Intrinsic Amplitude: 2.3 mV
Lead Channel Setting Pacing Amplitude: 2 V
Lead Channel Setting Pacing Amplitude: 2.125
Lead Channel Setting Pacing Amplitude: 2.25 V
Lead Channel Setting Pacing Pulse Width: 0.4 ms
Lead Channel Setting Pacing Pulse Width: 0.4 ms
Lead Channel Setting Sensing Sensitivity: 2 mV
Pulse Gen Model: 3262
Pulse Gen Serial Number: 7866838

## 2020-09-12 NOTE — Progress Notes (Signed)
Remote pacemaker transmission.   

## 2020-09-14 DIAGNOSIS — M25569 Pain in unspecified knee: Secondary | ICD-10-CM | POA: Diagnosis not present

## 2020-09-14 DIAGNOSIS — M25461 Effusion, right knee: Secondary | ICD-10-CM | POA: Diagnosis not present

## 2020-09-14 DIAGNOSIS — M170 Bilateral primary osteoarthritis of knee: Secondary | ICD-10-CM | POA: Diagnosis not present

## 2020-09-14 DIAGNOSIS — M159 Polyosteoarthritis, unspecified: Secondary | ICD-10-CM | POA: Diagnosis not present

## 2020-09-18 ENCOUNTER — Other Ambulatory Visit (HOSPITAL_COMMUNITY): Payer: Self-pay | Admitting: Cardiology

## 2020-09-19 DIAGNOSIS — M159 Polyosteoarthritis, unspecified: Secondary | ICD-10-CM | POA: Diagnosis not present

## 2020-09-20 ENCOUNTER — Other Ambulatory Visit: Payer: Self-pay

## 2020-09-20 ENCOUNTER — Encounter (HOSPITAL_COMMUNITY): Payer: Self-pay | Admitting: Cardiology

## 2020-09-20 ENCOUNTER — Ambulatory Visit (HOSPITAL_COMMUNITY)
Admission: RE | Admit: 2020-09-20 | Discharge: 2020-09-20 | Disposition: A | Discharge status: Home / Self Care | Payer: Medicare Other | Source: Ambulatory Visit | Attending: Cardiology | Admitting: Cardiology

## 2020-09-20 VITALS — BP 142/68 | HR 64 | Wt 171.0 lb

## 2020-09-20 DIAGNOSIS — Z95810 Presence of automatic (implantable) cardiac defibrillator: Secondary | ICD-10-CM | POA: Insufficient documentation

## 2020-09-20 DIAGNOSIS — N189 Chronic kidney disease, unspecified: Secondary | ICD-10-CM | POA: Diagnosis not present

## 2020-09-20 DIAGNOSIS — I5042 Chronic combined systolic (congestive) and diastolic (congestive) heart failure: Secondary | ICD-10-CM | POA: Diagnosis not present

## 2020-09-20 DIAGNOSIS — Z7982 Long term (current) use of aspirin: Secondary | ICD-10-CM | POA: Diagnosis not present

## 2020-09-20 DIAGNOSIS — Z79899 Other long term (current) drug therapy: Secondary | ICD-10-CM | POA: Diagnosis not present

## 2020-09-20 DIAGNOSIS — I2510 Atherosclerotic heart disease of native coronary artery without angina pectoris: Secondary | ICD-10-CM | POA: Diagnosis not present

## 2020-09-20 DIAGNOSIS — I6529 Occlusion and stenosis of unspecified carotid artery: Secondary | ICD-10-CM | POA: Diagnosis not present

## 2020-09-20 DIAGNOSIS — I5022 Chronic systolic (congestive) heart failure: Secondary | ICD-10-CM | POA: Diagnosis not present

## 2020-09-20 DIAGNOSIS — I252 Old myocardial infarction: Secondary | ICD-10-CM | POA: Diagnosis not present

## 2020-09-20 DIAGNOSIS — I255 Ischemic cardiomyopathy: Secondary | ICD-10-CM | POA: Insufficient documentation

## 2020-09-20 DIAGNOSIS — I7300 Raynaud's syndrome without gangrene: Secondary | ICD-10-CM | POA: Diagnosis not present

## 2020-09-20 DIAGNOSIS — Z8249 Family history of ischemic heart disease and other diseases of the circulatory system: Secondary | ICD-10-CM | POA: Insufficient documentation

## 2020-09-20 DIAGNOSIS — Z7902 Long term (current) use of antithrombotics/antiplatelets: Secondary | ICD-10-CM | POA: Diagnosis not present

## 2020-09-20 DIAGNOSIS — Z87891 Personal history of nicotine dependence: Secondary | ICD-10-CM | POA: Diagnosis not present

## 2020-09-20 LAB — BASIC METABOLIC PANEL
Anion gap: 8 (ref 5–15)
BUN: 15 mg/dL (ref 8–23)
CO2: 23 mmol/L (ref 22–32)
Calcium: 9 mg/dL (ref 8.9–10.3)
Chloride: 101 mmol/L (ref 98–111)
Creatinine, Ser: 0.88 mg/dL (ref 0.61–1.24)
GFR, Estimated: >60 mL/min (ref >60)
Glucose, Bld: 99 mg/dL (ref 70–99)
Potassium: 3.9 mmol/L (ref 3.5–5.1)
Sodium: 132 mmol/L — ABNORMAL LOW (ref 135–145)

## 2020-09-20 MED ORDER — CARVEDILOL 12.5 MG PO TABS: 12.5000 mg | ORAL_TABLET | Freq: Two times a day (BID) | ORAL | 11 refills | Status: DC

## 2020-09-20 NOTE — Progress Notes (Signed)
Patient ID: Henry Neal, male   DOB: January 24, 1927, 86 y.o.   MRN: [id number]      Advanced Heart Failure Clinic Note  PCP: Dr. 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 male with history of CAD, chronic systolic CHF, LBBB, AAA s/p repair presents for followup of CHF and CAD.  He had a cardiac cath in 12/15, showing total occlusion of the LAD and 50-60% proximal RCA.  EF at that time was near-normal.  In 10/16, he developed dyspnea and epigastric pain.  He was noted to be in acute pulmonary edema and was diuresed.  Troponin was only mildly elevated at 0.47.  He ultimately underwent LHC showing hazy 80-90% RCA stenosis which was likely the culprit for his presentation.  Additionally, the LAD was only subtotally occluded on this cath.  He had DES to RCA.  Cardiac MRI was done, showing EF 22% with significant viability in LAD territory.  Therefore, CTO of LAD was opened with DES.  Echo in 2/17 showed EF persistently low at 15%.  He had St Jude CRT-P placement in 4/17. Echo in 1/19 showed EF up to 45-50% with mild MR.    Echo in 3/20 showed EF 45% with mild diffuse hypokinesis, mildly decreased RV systolic function. Echo in 3/21 showed EF 45-50%, mild RV dilation with normal systolic function.   He has been doing well currently.  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  Weight down 3 lbs.  Breathing stable, occasionally short of breath with heavier exertion.  Does fine walking on flat ground.  No chest pain. No orthopnea/PND. BP mildly elevated today.   St Jude device interrogation: >99% BiV pacing, no atrial fibrillation, stable thoracic impedance.   Labs (10/16): hgb 9.7, K 4.6, creatinine 1.12 Labs (11/16): K 5.9, creatinine 1.5 Labs (12/16); K 4.2, creatinine 1.07 => 1.02, BNP 448 Labs (1/17): K 4.1, creatinine 1.13, BNP 423, LDL 40, HDL 47 Labs (4/17): K 3.9, creatinine 1.03, HCT 32.6 Labs (5/17): K 4.4, creatinine 1.09 Labs (1/18): LDL 37 Labs (01/25/2017): K 3.5 Creatinine  0.96  Labs (8/18): BNP 30, K 3.7, creatinine 1.0 Labs (11/18): LDL 40, HDL 47, K 3.5, creatinine 0.96  Labs (3/19): K 4, creatinine 0.97 Labs (11/19): K 3.9, creatinine 1.2, LDL 44 Labs (4/20): K 4.1, creatinine 1.11 Labs (10/20): LDL 46 Labs (11/20): K 4.4, creatinine 1.05 Labs (3/21): K 4.2, creatinine 0.92 Labs (7/21): LDL 58 Labs (8/21): K 4, creatinine 1.27  PMH: 1. CKD 2. AAA s/p repair, followed at [facility]. 3. Carotid stenosis: s/p right CEA.   Carotid dopplers (2/16) with right CEA stable, < 25% LICA stenosis.  Followed at [facility].  - Carotid dopplers (3/66): 4-40% LICA, patent right CEA.  4. Chronic LBBB 5. LAD: LHC (12/15) with totally occluded LAD, 50-60% pRCA => medically managed.  10/16 admitted with NSTEMI.  LHC with subtotalled LAD occlusion, 80-90% pRCA with thrombus => DES RCA initially.  Cardiac MRI was done showing viability in the LAD territory, so he had DES to the CTO LAD.   6. Chronic systolic CHF: Ischemic cardiomyopathy.  Echo (10/16) with EF 15% with wall motion abnormalities, moderately dilated RV with normal systolic function.  CMRI (10/16) with severe LV dilation, EF 22% with septal-lateral dyssynchrony, no LV thrombus, normal RV size/systolic function => significant viability noted in the LAD territory.  Echo (2/17): EF 15%, severe LV dilation, wall motion abnormalities noted, mild MR, mildly dilated RV with normal systolic function, PASP 55  mmHg.  St Jude CRT-P 4/17.  - Echo (1/18): EF 50-55%, mild to moderate MR.  - Echo (1/19): EF 45-50%, mild LVH, inferior hypokinesis, mild MR, PASP 30 mmHg.  - Echo (3/20): EF 45%, diffuse hypokinesis, normal RV size with mildly decreased systolic function.  - Echo (3/21): EF 45-50%, mildly dilated RV with normal systolic function.  7. ABIs (10/16) were normal.  8. Raynaud's syndrome.  9. Sciatica  SH: Lives alone, prior smoker, still working as a Furniture conservator/restorer.  FH: CAD  Review of systems complete and found to be negative unless  listed in HPI.    Current Outpatient Medications  Medication Sig Dispense Refill  . acetaminophen (TYLENOL) 500 MG tablet Take 1,000 mg by mouth daily as needed for mild pain or headache. Reported on 01/25/2016    . aspirin EC 81 MG tablet Take 81 mg by mouth daily.      Marland Kitchen atorvastatin (LIPITOR) 40 MG tablet Take 1 tablet (40 mg total) by mouth daily at 6 PM. 30 tablet 6  . Calcium Carbonate-Vitamin D (CALCIUM 600 + D PO) Take 1 tablet by mouth 2 (two) times daily.     . clopidogrel (PLAVIX) 75 MG tablet TAKE 1 TABLET BY MOUTH EVERY DAY WITH BREAKFAST 90 tablet 3  . clotrimazole-betamethasone (LOTRISONE) cream APPLY TO AA BID  1  . ENTRESTO 24-26 MG TAKE 1 TABLET BY MOUTH TWICE DAILY 60 tablet 4  . finasteride (PROSCAR) 5 MG tablet take 1 tablet by mouth once daily    . fluticasone (FLONASE) 50 MCG/ACT nasal spray Place 1 spray into both nostrils daily as needed for allergies.     . furosemide (LASIX) 20 MG tablet Take 1 tablet (20 mg total) by mouth every other day. 15 tablet 6  . gabapentin (NEURONTIN) 300 MG capsule Take 300 mg by mouth 2 (two) times daily.  0  . meclizine (ANTIVERT) 25 MG tablet Take 1 tablet (25 mg total) by mouth 2 (two) times daily as needed for dizziness. 30 tablet 0  . meloxicam (MOBIC) 15 MG tablet Take 15 mg by mouth daily as needed for pain.  0  . mometasone (ELOCON) 0.1 % cream Apply 1 application topically daily as needed (for irritation).     . Multiple Vitamin (MULTIVITAMIN PO) Take 1 tablet by mouth daily. ALIVE men's vitamin    . nitroGLYCERIN (NITROSTAT) 0.4 MG SL tablet PLACE 1 TABLET UNDER THE TONGUE IF NEEDED EVERY 5 MINUTES FOR CHEST PAIN FOR 3 DOSES IF NO RELIEF AFTER FIRST DOSE CALL PRESCRIBER OR 911 25 tablet 5  . ondansetron (ZOFRAN) 4 MG tablet Take 4 mg by mouth 3 (three) times daily as needed.    . pantoprazole (PROTONIX) 40 MG tablet Take 40 mg by mouth 2 (two) times daily.   0  . polyethylene glycol (MIRALAX / GLYCOLAX) packet Take 17 g by mouth  daily as needed for mild constipation.     Marland Kitchen spironolactone (ALDACTONE) 25 MG tablet TAKE 1 TABLET(25 MG) BY MOUTH DAILY 90 tablet 3  . sulfaSALAzine (AZULFIDINE) 500 MG tablet Take 500 mg by mouth 2 (two) times daily.     . carvedilol (COREG) 12.5 MG tablet Take 1 tablet (12.5 mg total) by mouth 2 (two) times daily. 60 tablet 11   No current facility-administered medications for this encounter.   BP (!) 142/68   Pulse 64   Wt 77.6 kg (171 lb)   SpO2 98%   BMI 24.54 kg/m   Wt Readings from Last  3 Encounters:  09/20/20 77.6 kg (171 lb)  09/06/20 77.5 kg (170 lb 12.8 oz)  05/17/20 78.9 kg (174 lb)    General: NAD Neck: No JVD, no thyromegaly or thyroid nodule.  Lungs: Clear to auscultation bilaterally with normal respiratory effort. CV: Nondisplaced PMI.  Heart regular S1/S2, no S3/S4, no murmur.  No peripheral edema.  No carotid bruit.  Normal pedal pulses.  Abdomen: Soft, nontender, no hepatosplenomegaly, no distention.  Skin: Intact without lesions or rashes.  Neurologic: Alert and oriented x 3.  Psych: Normal affect. Extremities: No clubbing or cyanosis.  HEENT: Normal.   Assessment/Plan: 1. CAD: NSTEMI with DES to RCA (culprit vessel) and subsequent DES to CTO of LAD (cMRI showed viability in the LAD distribution) in 10/16. No chest pain.   - Continue ASA 81 and Plavix 75.  - atorvastatin 40 daily, good lipids 7/21.  2. Chronic systolic/diastolic heart failure: Ischemic cardiomyopathy. Echo in 3/21 showed EF 45-50%, improved since getting CRT.  St Jude CRT-D.  NYHA class II.  Volume status looks ok on exam and by [place].  - Continue Lasix 20 mg qod. BMET today.  - Increase Coreg to 12.5 mg bid (BP mildly elevated).  - Continue spironolactone 25 mg daily.   - Continue Entresto 24/26 mg BID.  3. Carotid stenosis:  - Follows at [facility].  Followup in 4 months  Loralie Champagne, MD  09/20/2020

## 2020-09-20 NOTE — Patient Instructions (Signed)
Labs done today. We will contact you only if your labs are abnormal  INCREASE Carvedilol 12.5mg (1 tablet) by mouth 2 times daily.  No other medication changes were made. Please continue all other medications as prescribed.   Your physician recommends that you schedule a follow-up appointment in: 4 months. Please contact our office in February 2022 to schedule a March 2022 appointment.  If you have any questions or concerns before your next appointment please send [place] a message through [place] or call our office at [phone].    TO LEAVE A MESSAGE FOR THE NURSE SELECT OPTION 2, PLEASE LEAVE A MESSAGE INCLUDING: . YOUR NAME . DATE OF BIRTH . CALL BACK NUMBER . REASON FOR CALL**this is important as we prioritize the call backs  YOU WILL RECEIVE A CALL BACK THE SAME DAY AS LONG AS YOU CALL BEFORE 4:00 PM   Do the following things EVERYDAY: 1) Weigh yourself in the morning before breakfast. Write it down and keep it in a log. 2) Take your medicines as prescribed 3) Eat low salt foods--Limit salt (sodium) to 2000 mg per day.  4) Stay as active as you can everyday 5) Limit all fluids for the day to less than 2 liters    At the [facility], you and your health needs are our priority. As part of our continuing mission to provide you with exceptional heart care, we have created designated Provider Care Teams. These Care Teams include your primary Cardiologist (physician) and Advanced Practice Providers (APPs- Physician Assistants and Nurse Practitioners) who all work together to provide you with the care you need, when you need it.   You may see any of the following providers on your designated Care Team at your next follow up: Marland Kitchen Dr Glori Bickers . Dr Loralie Champagne . Darrick Grinder, NP . Lyda Jester, PA . Audry Riles, PharmD   Please be sure to bring in all your medications bottles to every appointment.

## 2020-10-04 ENCOUNTER — Ambulatory Visit (INDEPENDENT_AMBULATORY_CARE_PROVIDER_SITE_OTHER): Payer: Medicare Other | Admitting: Podiatry

## 2020-10-04 ENCOUNTER — Other Ambulatory Visit: Payer: Self-pay

## 2020-10-04 ENCOUNTER — Encounter: Payer: Self-pay | Admitting: Podiatry

## 2020-10-04 DIAGNOSIS — B351 Tinea unguium: Secondary | ICD-10-CM | POA: Diagnosis not present

## 2020-10-04 DIAGNOSIS — D689 Coagulation defect, unspecified: Secondary | ICD-10-CM | POA: Diagnosis not present

## 2020-10-04 DIAGNOSIS — I739 Peripheral vascular disease, unspecified: Secondary | ICD-10-CM

## 2020-10-04 DIAGNOSIS — M79676 Pain in unspecified toe(s): Secondary | ICD-10-CM | POA: Diagnosis not present

## 2020-10-04 NOTE — Progress Notes (Signed)
This patient returns to my office for at risk foot care.  This patient requires this care by a professional since this patient will be at risk due to having renal insufficiency coagulation defect and PAD.   Patient is taking plavix.    This patient is unable to cut nails himself since the patient cannot reach his nails.These nails are painful walking and wearing shoes.  This patient presents for at risk foot care today.  General Appearance  Alert, conversant and in no acute stress.  Vascular  Dorsalis pedis and posterior tibial  pulses are weakly  palpable  bilaterally.  Capillary return is within normal limits  bilaterally. Temperature is within normal limits  bilaterally.  Neurologic  Senn-Weinstein monofilament wire test within normal limits  bilaterally. Muscle power within normal limits bilaterally.  Nails Thick disfigured discolored nails with subungual debris  from hallux to fifth toes bilaterally. No evidence of bacterial infection or drainage bilaterally.  Orthopedic  No limitations of motion  feet .  No crepitus or effusions noted.  No bony pathology or digital deformities noted.  Skin  normotropic skin with no porokeratosis noted bilaterally.  No signs of infections or ulcers noted.     Onychomycosis  Pain in right toes  Pain in left toes  Consent was obtained for treatment procedures.   Mechanical debridement of nails 1-5  bilaterally performed with a nail nipper.  Filed with dremel without incident.    Return office visit   10 weeks                   Told patient to return for periodic foot care and evaluation due to potential at risk complications.   Korine Winton DPM  

## 2020-11-01 ENCOUNTER — Other Ambulatory Visit (HOSPITAL_COMMUNITY): Payer: Self-pay | Admitting: Cardiology

## 2020-11-04 DIAGNOSIS — T1512XA Foreign body in conjunctival sac, left eye, initial encounter: Secondary | ICD-10-CM | POA: Diagnosis not present

## 2020-11-04 DIAGNOSIS — H02055 Trichiasis without entropian left lower eyelid: Secondary | ICD-10-CM | POA: Diagnosis not present

## 2020-11-04 DIAGNOSIS — H02054 Trichiasis without entropian left upper eyelid: Secondary | ICD-10-CM | POA: Diagnosis not present

## 2020-11-17 DIAGNOSIS — M25461 Effusion, right knee: Secondary | ICD-10-CM | POA: Diagnosis not present

## 2020-11-17 DIAGNOSIS — M25569 Pain in unspecified knee: Secondary | ICD-10-CM | POA: Diagnosis not present

## 2020-11-17 DIAGNOSIS — M159 Polyosteoarthritis, unspecified: Secondary | ICD-10-CM | POA: Diagnosis not present

## 2020-11-17 DIAGNOSIS — M170 Bilateral primary osteoarthritis of knee: Secondary | ICD-10-CM | POA: Diagnosis not present

## 2020-11-25 DIAGNOSIS — R7303 Prediabetes: Secondary | ICD-10-CM | POA: Diagnosis not present

## 2020-11-25 DIAGNOSIS — E782 Mixed hyperlipidemia: Secondary | ICD-10-CM | POA: Diagnosis not present

## 2020-11-25 DIAGNOSIS — I10 Essential (primary) hypertension: Secondary | ICD-10-CM | POA: Diagnosis not present

## 2020-11-25 DIAGNOSIS — Z0000 Encounter for general adult medical examination without abnormal findings: Secondary | ICD-10-CM | POA: Diagnosis not present

## 2020-11-25 DIAGNOSIS — N182 Chronic kidney disease, stage 2 (mild): Secondary | ICD-10-CM | POA: Diagnosis not present

## 2020-11-25 DIAGNOSIS — I779 Disorder of arteries and arterioles, unspecified: Secondary | ICD-10-CM | POA: Diagnosis not present

## 2020-12-02 DIAGNOSIS — I700 Atherosclerosis of aorta: Secondary | ICD-10-CM | POA: Diagnosis not present

## 2020-12-02 DIAGNOSIS — Z0000 Encounter for general adult medical examination without abnormal findings: Secondary | ICD-10-CM | POA: Diagnosis not present

## 2020-12-02 DIAGNOSIS — I130 Hypertensive heart and chronic kidney disease with heart failure and stage 1 through stage 4 chronic kidney disease, or unspecified chronic kidney disease: Secondary | ICD-10-CM | POA: Diagnosis not present

## 2020-12-02 DIAGNOSIS — I739 Peripheral vascular disease, unspecified: Secondary | ICD-10-CM | POA: Diagnosis not present

## 2020-12-09 ENCOUNTER — Other Ambulatory Visit: Payer: Self-pay | Admitting: Internal Medicine

## 2020-12-09 ENCOUNTER — Ambulatory Visit (INDEPENDENT_AMBULATORY_CARE_PROVIDER_SITE_OTHER): Payer: Medicare Other

## 2020-12-09 DIAGNOSIS — I441 Atrioventricular block, second degree: Secondary | ICD-10-CM | POA: Diagnosis not present

## 2020-12-09 LAB — CUP PACEART REMOTE DEVICE CHECK
Battery Remaining Longevity: 107 mo
Battery Remaining Percentage: 95.5 %
Battery Voltage: 2.96 V
Brady Statistic AP VP Percent: 1.8 %
Brady Statistic AP VS Percent: 1 %
Brady Statistic AS VP Percent: 98 %
Brady Statistic AS VS Percent: 1 %
Brady Statistic RA Percent Paced: 1.5 %
Date Time Interrogation Session: 20220218033656
Implantable Lead Implant Date: 20170406
Implantable Lead Implant Date: 20170406
Implantable Lead Implant Date: 20170406
Implantable Lead Location: 753858
Implantable Lead Location: 753859
Implantable Lead Location: 753860
Implantable Pulse Generator Implant Date: 20170406
Lead Channel Impedance Value: 450 Ohm
Lead Channel Impedance Value: 480 Ohm
Lead Channel Impedance Value: 800 Ohm
Lead Channel Pacing Threshold Amplitude: 0.5 V
Lead Channel Pacing Threshold Amplitude: 0.875 V
Lead Channel Pacing Threshold Amplitude: 1.125 V
Lead Channel Pacing Threshold Pulse Width: 0.4 ms
Lead Channel Pacing Threshold Pulse Width: 0.4 ms
Lead Channel Pacing Threshold Pulse Width: 0.4 ms
Lead Channel Sensing Intrinsic Amplitude: 12 mV
Lead Channel Sensing Intrinsic Amplitude: 2.8 mV
Lead Channel Setting Pacing Amplitude: 2 V
Lead Channel Setting Pacing Amplitude: 2 V
Lead Channel Setting Pacing Amplitude: 2.125
Lead Channel Setting Pacing Pulse Width: 0.4 ms
Lead Channel Setting Pacing Pulse Width: 0.4 ms
Lead Channel Setting Sensing Sensitivity: 2 mV
Pulse Gen Model: 3262
Pulse Gen Serial Number: 7866838

## 2020-12-12 ENCOUNTER — Other Ambulatory Visit (HOSPITAL_COMMUNITY): Payer: Self-pay | Admitting: Cardiology

## 2020-12-12 NOTE — Progress Notes (Signed)
Remote pacemaker transmission.   

## 2020-12-13 DIAGNOSIS — H52203 Unspecified astigmatism, bilateral: Secondary | ICD-10-CM | POA: Diagnosis not present

## 2020-12-13 DIAGNOSIS — H5010 Unspecified exotropia: Secondary | ICD-10-CM | POA: Diagnosis not present

## 2020-12-13 DIAGNOSIS — H02055 Trichiasis without entropian left lower eyelid: Secondary | ICD-10-CM | POA: Diagnosis not present

## 2020-12-13 DIAGNOSIS — H53001 Unspecified amblyopia, right eye: Secondary | ICD-10-CM | POA: Diagnosis not present

## 2020-12-15 DIAGNOSIS — Z79899 Other long term (current) drug therapy: Secondary | ICD-10-CM | POA: Diagnosis not present

## 2020-12-15 DIAGNOSIS — M459 Ankylosing spondylitis of unspecified sites in spine: Secondary | ICD-10-CM | POA: Diagnosis not present

## 2020-12-15 DIAGNOSIS — I5022 Chronic systolic (congestive) heart failure: Secondary | ICD-10-CM | POA: Diagnosis not present

## 2020-12-15 DIAGNOSIS — M8580 Other specified disorders of bone density and structure, unspecified site: Secondary | ICD-10-CM | POA: Diagnosis not present

## 2020-12-16 ENCOUNTER — Encounter: Payer: Self-pay | Admitting: Podiatry

## 2020-12-16 ENCOUNTER — Ambulatory Visit (INDEPENDENT_AMBULATORY_CARE_PROVIDER_SITE_OTHER): Payer: Medicare Other | Admitting: Podiatry

## 2020-12-16 ENCOUNTER — Other Ambulatory Visit: Payer: Self-pay

## 2020-12-16 DIAGNOSIS — M79676 Pain in unspecified toe(s): Secondary | ICD-10-CM

## 2020-12-16 DIAGNOSIS — B351 Tinea unguium: Secondary | ICD-10-CM

## 2020-12-16 DIAGNOSIS — I739 Peripheral vascular disease, unspecified: Secondary | ICD-10-CM

## 2020-12-16 DIAGNOSIS — D689 Coagulation defect, unspecified: Secondary | ICD-10-CM

## 2020-12-16 NOTE — Progress Notes (Signed)
This patient returns to my office for at risk foot care.  This patient requires this care by a professional since this patient will be at risk due to having renal insufficiency coagulation defect and PAD.   Patient is taking plavix.    This patient is unable to cut nails himself since the patient cannot reach his nails.These nails are painful walking and wearing shoes.  This patient presents for at risk foot care today.  General Appearance  Alert, conversant and in no acute stress.  Vascular  Dorsalis pedis and posterior tibial  pulses are weakly  palpable  bilaterally.  Capillary return is within normal limits  bilaterally. Temperature is within normal limits  bilaterally.  Neurologic  Senn-Weinstein monofilament wire test within normal limits  bilaterally. Muscle power within normal limits bilaterally.  Nails Thick disfigured discolored nails with subungual debris  from hallux to fifth toes bilaterally. No evidence of bacterial infection or drainage bilaterally.  Orthopedic  No limitations of motion  feet .  No crepitus or effusions noted.  No bony pathology or digital deformities noted.  Skin  normotropic skin with no porokeratosis noted bilaterally.  No signs of infections or ulcers noted.     Onychomycosis  Pain in right toes  Pain in left toes  Consent was obtained for treatment procedures.   Mechanical debridement of nails 1-5  bilaterally performed with a nail nipper.  Filed with dremel without incident. Padding dispensed.  Return office visit   10 weeks                   Told patient to return for periodic foot care and evaluation due to potential at risk complications.   Gardiner Barefoot DPM

## 2020-12-22 DIAGNOSIS — H52223 Regular astigmatism, bilateral: Secondary | ICD-10-CM | POA: Diagnosis not present

## 2021-01-04 ENCOUNTER — Telehealth (HOSPITAL_COMMUNITY): Payer: Self-pay

## 2021-01-04 NOTE — Telephone Encounter (Signed)
Patients cardiac rehab referral form signed and successfully faxed to [phone] on 01/02/21. Form will be scanned into patients chart.

## 2021-01-10 ENCOUNTER — Encounter (HOSPITAL_COMMUNITY)
Admission: RE | Admit: 2021-01-10 | Discharge: 2021-01-10 | Disposition: A | Discharge status: Home / Self Care | Payer: Self-pay | Source: Ambulatory Visit | Attending: Cardiology | Admitting: Cardiology

## 2021-01-10 ENCOUNTER — Other Ambulatory Visit: Payer: Self-pay

## 2021-01-10 DIAGNOSIS — I509 Heart failure, unspecified: Secondary | ICD-10-CM | POA: Insufficient documentation

## 2021-01-12 ENCOUNTER — Other Ambulatory Visit: Payer: Self-pay

## 2021-01-12 ENCOUNTER — Encounter (HOSPITAL_COMMUNITY)
Admission: RE | Admit: 2021-01-12 | Discharge: 2021-01-12 | Disposition: A | Discharge status: Home / Self Care | Payer: Self-pay | Source: Ambulatory Visit | Attending: Cardiology | Admitting: Cardiology

## 2021-01-17 ENCOUNTER — Encounter (HOSPITAL_COMMUNITY)
Admission: RE | Admit: 2021-01-17 | Discharge: 2021-01-17 | Disposition: A | Discharge status: Home / Self Care | Payer: Self-pay | Source: Ambulatory Visit | Attending: Cardiology | Admitting: Cardiology

## 2021-01-17 ENCOUNTER — Other Ambulatory Visit: Payer: Self-pay

## 2021-01-18 ENCOUNTER — Encounter (HOSPITAL_COMMUNITY): Payer: Medicare Other | Admitting: Cardiology

## 2021-01-19 ENCOUNTER — Encounter (HOSPITAL_COMMUNITY)
Admission: RE | Admit: 2021-01-19 | Discharge: 2021-01-19 | Disposition: A | Discharge status: Home / Self Care | Payer: Self-pay | Source: Ambulatory Visit | Attending: Cardiology | Admitting: Cardiology

## 2021-01-19 ENCOUNTER — Other Ambulatory Visit: Payer: Self-pay

## 2021-01-24 ENCOUNTER — Other Ambulatory Visit: Payer: Self-pay

## 2021-01-24 ENCOUNTER — Encounter (HOSPITAL_COMMUNITY)
Admission: RE | Admit: 2021-01-24 | Discharge: 2021-01-24 | Disposition: A | Discharge status: Home / Self Care | Payer: Self-pay | Source: Ambulatory Visit | Attending: Cardiology | Admitting: Cardiology

## 2021-01-24 DIAGNOSIS — I509 Heart failure, unspecified: Secondary | ICD-10-CM | POA: Insufficient documentation

## 2021-01-26 ENCOUNTER — Encounter (HOSPITAL_COMMUNITY): Payer: Self-pay

## 2021-01-31 ENCOUNTER — Other Ambulatory Visit: Payer: Self-pay

## 2021-01-31 ENCOUNTER — Encounter (HOSPITAL_COMMUNITY)
Admission: RE | Admit: 2021-01-31 | Discharge: 2021-01-31 | Disposition: A | Discharge status: Home / Self Care | Payer: Self-pay | Source: Ambulatory Visit | Attending: Cardiology | Admitting: Cardiology

## 2021-02-02 ENCOUNTER — Encounter (HOSPITAL_COMMUNITY)
Admission: RE | Admit: 2021-02-02 | Discharge: 2021-02-02 | Disposition: A | Discharge status: Home / Self Care | Payer: Self-pay | Source: Ambulatory Visit | Attending: Cardiology | Admitting: Cardiology

## 2021-02-02 ENCOUNTER — Other Ambulatory Visit: Payer: Self-pay

## 2021-02-07 ENCOUNTER — Telehealth (HOSPITAL_COMMUNITY): Payer: Self-pay

## 2021-02-07 ENCOUNTER — Encounter (HOSPITAL_COMMUNITY): Payer: Self-pay

## 2021-02-07 NOTE — Telephone Encounter (Signed)
pt having trouble with his knees and is unable to make it in today for cardiac rehab maintenance.

## 2021-02-09 ENCOUNTER — Other Ambulatory Visit: Payer: Self-pay

## 2021-02-09 ENCOUNTER — Encounter (HOSPITAL_COMMUNITY)
Admission: RE | Admit: 2021-02-09 | Discharge: 2021-02-09 | Disposition: A | Discharge status: Home / Self Care | Payer: Medicare Other | Source: Ambulatory Visit | Attending: Cardiology | Admitting: Cardiology

## 2021-02-14 ENCOUNTER — Encounter (HOSPITAL_COMMUNITY): Payer: Self-pay

## 2021-02-14 DIAGNOSIS — Z79899 Other long term (current) drug therapy: Secondary | ICD-10-CM | POA: Diagnosis not present

## 2021-02-14 DIAGNOSIS — M459 Ankylosing spondylitis of unspecified sites in spine: Secondary | ICD-10-CM | POA: Diagnosis not present

## 2021-02-14 DIAGNOSIS — I5022 Chronic systolic (congestive) heart failure: Secondary | ICD-10-CM | POA: Diagnosis not present

## 2021-02-14 DIAGNOSIS — M8580 Other specified disorders of bone density and structure, unspecified site: Secondary | ICD-10-CM | POA: Diagnosis not present

## 2021-02-16 ENCOUNTER — Encounter (HOSPITAL_COMMUNITY): Payer: Self-pay

## 2021-02-16 ENCOUNTER — Telehealth (HOSPITAL_COMMUNITY): Payer: Self-pay | Admitting: Internal Medicine

## 2021-02-21 ENCOUNTER — Other Ambulatory Visit: Payer: Self-pay

## 2021-02-21 ENCOUNTER — Encounter (HOSPITAL_COMMUNITY)
Admission: RE | Admit: 2021-02-21 | Discharge: 2021-02-21 | Disposition: A | Discharge status: Home / Self Care | Payer: Self-pay | Source: Ambulatory Visit | Attending: Cardiology | Admitting: Cardiology

## 2021-02-21 DIAGNOSIS — I509 Heart failure, unspecified: Secondary | ICD-10-CM | POA: Insufficient documentation

## 2021-02-23 ENCOUNTER — Encounter (HOSPITAL_COMMUNITY)
Admission: RE | Admit: 2021-02-23 | Discharge: 2021-02-23 | Disposition: A | Discharge status: Home / Self Care | Payer: Self-pay | Source: Ambulatory Visit | Attending: Cardiology | Admitting: Cardiology

## 2021-02-23 ENCOUNTER — Other Ambulatory Visit: Payer: Self-pay

## 2021-02-24 ENCOUNTER — Ambulatory Visit (INDEPENDENT_AMBULATORY_CARE_PROVIDER_SITE_OTHER): Payer: Medicare Other | Admitting: Podiatry

## 2021-02-24 ENCOUNTER — Encounter: Payer: Self-pay | Admitting: Podiatry

## 2021-02-24 DIAGNOSIS — D689 Coagulation defect, unspecified: Secondary | ICD-10-CM | POA: Diagnosis not present

## 2021-02-24 DIAGNOSIS — M79676 Pain in unspecified toe(s): Secondary | ICD-10-CM | POA: Diagnosis not present

## 2021-02-24 DIAGNOSIS — B351 Tinea unguium: Secondary | ICD-10-CM

## 2021-02-24 DIAGNOSIS — I739 Peripheral vascular disease, unspecified: Secondary | ICD-10-CM | POA: Diagnosis not present

## 2021-02-24 NOTE — Progress Notes (Signed)
This patient returns to my office for at risk foot care.  This patient requires this care by a professional since this patient will be at risk due to having renal insufficiency coagulation defect and PAD.   Patient is taking plavix.    This patient is unable to cut nails himself since the patient cannot reach his nails.These nails are painful walking and wearing shoes.  This patient presents for at risk foot care today.  General Appearance  Alert, conversant and in no acute stress.  Vascular  Dorsalis pedis and posterior tibial  pulses are weakly  palpable  bilaterally.  Capillary return is within normal limits  bilaterally. Temperature is within normal limits  bilaterally.  Neurologic  Senn-Weinstein monofilament wire test within normal limits  bilaterally. Muscle power within normal limits bilaterally.  Nails Thick disfigured discolored nails with subungual debris  from hallux to fifth toes bilaterally. No evidence of bacterial infection or drainage bilaterally.  Orthopedic  No limitations of motion  feet .  No crepitus or effusions noted.  No bony pathology or digital deformities noted.  Skin  normotropic skin with no porokeratosis noted bilaterally.  No signs of infections or ulcers noted.     Onychomycosis  Pain in right toes  Pain in left toes  Consent was obtained for treatment procedures.   Mechanical debridement of nails 1-5  bilaterally performed with a nail nipper.  Filed with dremel without incident.    Return office visit   10 weeks                   Told patient to return for periodic foot care and evaluation due to potential at risk complications.   Carleton Vanvalkenburgh DPM  

## 2021-02-28 ENCOUNTER — Other Ambulatory Visit: Payer: Self-pay

## 2021-02-28 ENCOUNTER — Encounter (HOSPITAL_COMMUNITY)
Admission: RE | Admit: 2021-02-28 | Discharge: 2021-02-28 | Disposition: A | Discharge status: Home / Self Care | Payer: Self-pay | Source: Ambulatory Visit | Attending: Cardiology | Admitting: Cardiology

## 2021-03-02 ENCOUNTER — Encounter (HOSPITAL_COMMUNITY)
Admission: RE | Admit: 2021-03-02 | Discharge: 2021-03-02 | Disposition: A | Discharge status: Home / Self Care | Payer: Self-pay | Source: Ambulatory Visit | Attending: Cardiology | Admitting: Cardiology

## 2021-03-02 ENCOUNTER — Other Ambulatory Visit: Payer: Self-pay

## 2021-03-07 ENCOUNTER — Encounter (HOSPITAL_COMMUNITY)
Admission: RE | Admit: 2021-03-07 | Discharge: 2021-03-07 | Disposition: A | Discharge status: Home / Self Care | Payer: Self-pay | Source: Ambulatory Visit | Attending: Cardiology | Admitting: Cardiology

## 2021-03-07 ENCOUNTER — Other Ambulatory Visit: Payer: Self-pay

## 2021-03-09 ENCOUNTER — Encounter (HOSPITAL_COMMUNITY)
Admission: RE | Admit: 2021-03-09 | Discharge: 2021-03-09 | Disposition: A | Discharge status: Home / Self Care | Payer: Self-pay | Source: Ambulatory Visit | Attending: Cardiology | Admitting: Cardiology

## 2021-03-09 ENCOUNTER — Other Ambulatory Visit: Payer: Self-pay

## 2021-03-10 ENCOUNTER — Ambulatory Visit (INDEPENDENT_AMBULATORY_CARE_PROVIDER_SITE_OTHER): Payer: Medicare Other

## 2021-03-10 DIAGNOSIS — I441 Atrioventricular block, second degree: Secondary | ICD-10-CM | POA: Diagnosis not present

## 2021-03-10 DIAGNOSIS — I5022 Chronic systolic (congestive) heart failure: Secondary | ICD-10-CM

## 2021-03-10 LAB — CUP PACEART REMOTE DEVICE CHECK
Battery Remaining Longevity: 107 mo
Battery Remaining Percentage: 95.5 %
Battery Voltage: 2.96 V
Brady Statistic AP VP Percent: 1.8 %
Brady Statistic AP VS Percent: 1 %
Brady Statistic AS VP Percent: 98 %
Brady Statistic AS VS Percent: 1 %
Brady Statistic RA Percent Paced: 1.5 %
Date Time Interrogation Session: 20220520031927
Implantable Lead Implant Date: 20170406
Implantable Lead Implant Date: 20170406
Implantable Lead Implant Date: 20170406
Implantable Lead Location: 753858
Implantable Lead Location: 753859
Implantable Lead Location: 753860
Implantable Pulse Generator Implant Date: 20170406
Lead Channel Impedance Value: 440 Ohm
Lead Channel Impedance Value: 460 Ohm
Lead Channel Impedance Value: 810 Ohm
Lead Channel Pacing Threshold Amplitude: 0.5 V
Lead Channel Pacing Threshold Amplitude: 0.875 V
Lead Channel Pacing Threshold Amplitude: 1.375 V
Lead Channel Pacing Threshold Pulse Width: 0.4 ms
Lead Channel Pacing Threshold Pulse Width: 0.4 ms
Lead Channel Pacing Threshold Pulse Width: 0.4 ms
Lead Channel Sensing Intrinsic Amplitude: 1.9 mV
Lead Channel Sensing Intrinsic Amplitude: 12 mV
Lead Channel Setting Pacing Amplitude: 2 V
Lead Channel Setting Pacing Amplitude: 2 V
Lead Channel Setting Pacing Amplitude: 2.375
Lead Channel Setting Pacing Pulse Width: 0.4 ms
Lead Channel Setting Pacing Pulse Width: 0.4 ms
Lead Channel Setting Sensing Sensitivity: 2 mV
Pulse Gen Model: 3262
Pulse Gen Serial Number: 7866838

## 2021-03-14 ENCOUNTER — Encounter (HOSPITAL_COMMUNITY)
Admission: RE | Admit: 2021-03-14 | Discharge: 2021-03-14 | Disposition: A | Discharge status: Home / Self Care | Payer: Self-pay | Source: Ambulatory Visit | Attending: Cardiology | Admitting: Cardiology

## 2021-03-14 ENCOUNTER — Other Ambulatory Visit: Payer: Self-pay

## 2021-03-16 ENCOUNTER — Other Ambulatory Visit: Payer: Self-pay

## 2021-03-16 ENCOUNTER — Encounter (HOSPITAL_COMMUNITY)
Admission: RE | Admit: 2021-03-16 | Discharge: 2021-03-16 | Disposition: A | Discharge status: Home / Self Care | Payer: Self-pay | Source: Ambulatory Visit | Attending: Cardiology | Admitting: Cardiology

## 2021-03-21 ENCOUNTER — Other Ambulatory Visit: Payer: Self-pay

## 2021-03-21 ENCOUNTER — Encounter (HOSPITAL_COMMUNITY)
Admission: RE | Admit: 2021-03-21 | Discharge: 2021-03-21 | Disposition: A | Discharge status: Home / Self Care | Payer: Medicare Other | Source: Ambulatory Visit | Attending: Cardiology | Admitting: Cardiology

## 2021-03-27 NOTE — Progress Notes (Signed)
Remote pacemaker transmission.   

## 2021-03-28 ENCOUNTER — Encounter (HOSPITAL_COMMUNITY): Payer: Medicare Other

## 2021-03-30 ENCOUNTER — Encounter (HOSPITAL_COMMUNITY): Payer: Medicare Other

## 2021-04-01 ENCOUNTER — Other Ambulatory Visit (HOSPITAL_COMMUNITY): Payer: Self-pay | Admitting: Cardiology

## 2021-04-03 ENCOUNTER — Other Ambulatory Visit (HOSPITAL_COMMUNITY): Payer: Self-pay | Admitting: Cardiology

## 2021-04-04 ENCOUNTER — Encounter (HOSPITAL_COMMUNITY): Payer: Medicare Other

## 2021-04-06 ENCOUNTER — Encounter (HOSPITAL_COMMUNITY): Payer: Medicare Other

## 2021-04-11 ENCOUNTER — Encounter (HOSPITAL_COMMUNITY): Payer: Medicare Other

## 2021-04-13 ENCOUNTER — Encounter (HOSPITAL_COMMUNITY): Payer: Medicare Other

## 2021-04-18 ENCOUNTER — Encounter (HOSPITAL_COMMUNITY): Payer: Medicare Other

## 2021-04-20 ENCOUNTER — Encounter (HOSPITAL_COMMUNITY): Payer: Medicare Other

## 2021-04-25 ENCOUNTER — Encounter (HOSPITAL_COMMUNITY)
Admission: RE | Admit: 2021-04-25 | Discharge: 2021-04-25 | Disposition: A | Discharge status: Home / Self Care | Payer: Self-pay | Source: Ambulatory Visit | Attending: Cardiology | Admitting: Cardiology

## 2021-04-25 ENCOUNTER — Other Ambulatory Visit: Payer: Self-pay

## 2021-04-25 DIAGNOSIS — I2510 Atherosclerotic heart disease of native coronary artery without angina pectoris: Secondary | ICD-10-CM | POA: Insufficient documentation

## 2021-04-25 DIAGNOSIS — Z950 Presence of cardiac pacemaker: Secondary | ICD-10-CM | POA: Insufficient documentation

## 2021-04-25 DIAGNOSIS — I5022 Chronic systolic (congestive) heart failure: Secondary | ICD-10-CM | POA: Insufficient documentation

## 2021-04-25 DIAGNOSIS — I252 Old myocardial infarction: Secondary | ICD-10-CM | POA: Insufficient documentation

## 2021-04-25 DIAGNOSIS — E785 Hyperlipidemia, unspecified: Secondary | ICD-10-CM | POA: Insufficient documentation

## 2021-04-25 NOTE — Progress Notes (Signed)
Patient returned to the cardiac rehab maintenance program and tolerated fair. Patient c/o feeling lightheaded after 26 minutes on the NuStep machine, BP was 96/40. Water given, recheck BP was 100/42, symptoms resolved. Patient resumed exercise without further issue.   Sol Passer, MS, ACSM CEP

## 2021-04-27 ENCOUNTER — Encounter (HOSPITAL_COMMUNITY)
Admission: RE | Admit: 2021-04-27 | Discharge: 2021-04-27 | Disposition: A | Discharge status: Home / Self Care | Payer: Medicare Other | Source: Ambulatory Visit | Attending: Cardiology | Admitting: Cardiology

## 2021-04-27 ENCOUNTER — Other Ambulatory Visit: Payer: Self-pay

## 2021-05-01 ENCOUNTER — Other Ambulatory Visit (HOSPITAL_COMMUNITY): Payer: Self-pay | Admitting: Cardiology

## 2021-05-01 DIAGNOSIS — Z85828 Personal history of other malignant neoplasm of skin: Secondary | ICD-10-CM | POA: Diagnosis not present

## 2021-05-01 DIAGNOSIS — L570 Actinic keratosis: Secondary | ICD-10-CM | POA: Diagnosis not present

## 2021-05-01 DIAGNOSIS — L821 Other seborrheic keratosis: Secondary | ICD-10-CM | POA: Diagnosis not present

## 2021-05-01 DIAGNOSIS — D692 Other nonthrombocytopenic purpura: Secondary | ICD-10-CM | POA: Diagnosis not present

## 2021-05-02 ENCOUNTER — Encounter (HOSPITAL_COMMUNITY)
Admission: RE | Admit: 2021-05-02 | Discharge: 2021-05-02 | Disposition: A | Discharge status: Home / Self Care | Payer: Self-pay | Source: Ambulatory Visit | Attending: Cardiology | Admitting: Cardiology

## 2021-05-02 ENCOUNTER — Other Ambulatory Visit: Payer: Self-pay

## 2021-05-03 DIAGNOSIS — Z79899 Other long term (current) drug therapy: Secondary | ICD-10-CM | POA: Diagnosis not present

## 2021-05-03 DIAGNOSIS — M459 Ankylosing spondylitis of unspecified sites in spine: Secondary | ICD-10-CM | POA: Diagnosis not present

## 2021-05-03 DIAGNOSIS — M8580 Other specified disorders of bone density and structure, unspecified site: Secondary | ICD-10-CM | POA: Diagnosis not present

## 2021-05-03 DIAGNOSIS — I5022 Chronic systolic (congestive) heart failure: Secondary | ICD-10-CM | POA: Diagnosis not present

## 2021-05-04 ENCOUNTER — Other Ambulatory Visit: Payer: Self-pay

## 2021-05-04 ENCOUNTER — Encounter (HOSPITAL_COMMUNITY)
Admission: RE | Admit: 2021-05-04 | Discharge: 2021-05-04 | Disposition: A | Discharge status: Home / Self Care | Payer: Self-pay | Source: Ambulatory Visit | Attending: Cardiology | Admitting: Cardiology

## 2021-05-05 ENCOUNTER — Ambulatory Visit: Payer: Medicare Other | Admitting: Podiatry

## 2021-05-09 ENCOUNTER — Encounter (HOSPITAL_COMMUNITY)
Admission: RE | Admit: 2021-05-09 | Discharge: 2021-05-09 | Disposition: A | Discharge status: Home / Self Care | Payer: Self-pay | Source: Ambulatory Visit | Attending: Cardiology | Admitting: Cardiology

## 2021-05-09 ENCOUNTER — Other Ambulatory Visit: Payer: Self-pay

## 2021-05-11 ENCOUNTER — Other Ambulatory Visit: Payer: Self-pay

## 2021-05-11 ENCOUNTER — Encounter (HOSPITAL_COMMUNITY)
Admission: RE | Admit: 2021-05-11 | Discharge: 2021-05-11 | Disposition: A | Discharge status: Home / Self Care | Payer: Self-pay | Source: Ambulatory Visit | Attending: Cardiology | Admitting: Cardiology

## 2021-05-16 ENCOUNTER — Other Ambulatory Visit: Payer: Self-pay

## 2021-05-16 ENCOUNTER — Encounter (HOSPITAL_COMMUNITY)
Admission: RE | Admit: 2021-05-16 | Discharge: 2021-05-16 | Disposition: A | Discharge status: Home / Self Care | Payer: Self-pay | Source: Ambulatory Visit | Attending: Cardiology | Admitting: Cardiology

## 2021-05-18 ENCOUNTER — Encounter (HOSPITAL_COMMUNITY): Payer: Medicare Other

## 2021-05-19 ENCOUNTER — Other Ambulatory Visit: Payer: Self-pay

## 2021-05-19 ENCOUNTER — Ambulatory Visit (INDEPENDENT_AMBULATORY_CARE_PROVIDER_SITE_OTHER): Payer: Medicare Other | Admitting: Podiatry

## 2021-05-19 ENCOUNTER — Encounter: Payer: Self-pay | Admitting: Podiatry

## 2021-05-19 DIAGNOSIS — B351 Tinea unguium: Secondary | ICD-10-CM

## 2021-05-19 DIAGNOSIS — M79676 Pain in unspecified toe(s): Secondary | ICD-10-CM

## 2021-05-19 DIAGNOSIS — I739 Peripheral vascular disease, unspecified: Secondary | ICD-10-CM | POA: Diagnosis not present

## 2021-05-19 DIAGNOSIS — D689 Coagulation defect, unspecified: Secondary | ICD-10-CM | POA: Diagnosis not present

## 2021-05-19 NOTE — Progress Notes (Signed)
This patient returns to my office for at risk foot care.  This patient requires this care by a professional since this patient will be at risk due to having renal insufficiency coagulation defect and PAD.   Patient is taking plavix.    This patient is unable to cut nails himself since the patient cannot reach his nails.These nails are painful walking and wearing shoes.  This patient presents for at risk foot care today.  General Appearance  Alert, conversant and in no acute stress.  Vascular  Dorsalis pedis and posterior tibial  pulses are weakly  palpable  bilaterally.  Capillary return is within normal limits  bilaterally. Temperature is within normal limits  bilaterally.  Neurologic  Senn-Weinstein monofilament wire test within normal limits  bilaterally. Muscle power within normal limits bilaterally.  Nails Thick disfigured discolored nails with subungual debris  from hallux to fifth toes bilaterally. No evidence of bacterial infection or drainage bilaterally.  Orthopedic  No limitations of motion  feet .  No crepitus or effusions noted.  No bony pathology or digital deformities noted.  Skin  normotropic skin with no porokeratosis noted bilaterally.  No signs of infections or ulcers noted.     Onychomycosis  Pain in right toes  Pain in left toes  Consent was obtained for treatment procedures.   Mechanical debridement of nails 1-5  bilaterally performed with a nail nipper.  Filed with dremel without incident.    Return office visit   10 weeks                   Told patient to return for periodic foot care and evaluation due to potential at risk complications.   Miri Jose DPM  

## 2021-05-23 ENCOUNTER — Encounter (HOSPITAL_COMMUNITY)
Admission: RE | Admit: 2021-05-23 | Discharge: 2021-05-23 | Disposition: A | Discharge status: Home / Self Care | Payer: Self-pay | Source: Ambulatory Visit | Attending: Cardiology | Admitting: Cardiology

## 2021-05-23 ENCOUNTER — Other Ambulatory Visit: Payer: Self-pay

## 2021-05-23 DIAGNOSIS — I441 Atrioventricular block, second degree: Secondary | ICD-10-CM | POA: Insufficient documentation

## 2021-05-25 ENCOUNTER — Other Ambulatory Visit: Payer: Self-pay

## 2021-05-25 ENCOUNTER — Encounter (HOSPITAL_COMMUNITY)
Admission: RE | Admit: 2021-05-25 | Discharge: 2021-05-25 | Disposition: A | Discharge status: Home / Self Care | Payer: Self-pay | Source: Ambulatory Visit | Attending: Cardiology | Admitting: Cardiology

## 2021-05-28 ENCOUNTER — Other Ambulatory Visit (HOSPITAL_COMMUNITY): Payer: Self-pay | Admitting: Cardiology

## 2021-05-30 ENCOUNTER — Other Ambulatory Visit: Payer: Self-pay

## 2021-05-30 ENCOUNTER — Encounter (HOSPITAL_COMMUNITY)
Admission: RE | Admit: 2021-05-30 | Discharge: 2021-05-30 | Disposition: A | Discharge status: Home / Self Care | Payer: Self-pay | Source: Ambulatory Visit | Attending: Cardiology | Admitting: Cardiology

## 2021-06-01 ENCOUNTER — Encounter (HOSPITAL_COMMUNITY): Payer: Self-pay

## 2021-06-06 ENCOUNTER — Encounter (HOSPITAL_COMMUNITY)
Admission: RE | Admit: 2021-06-06 | Discharge: 2021-06-06 | Disposition: A | Discharge status: Home / Self Care | Payer: Self-pay | Source: Ambulatory Visit | Attending: Cardiology | Admitting: Cardiology

## 2021-06-06 ENCOUNTER — Other Ambulatory Visit: Payer: Self-pay

## 2021-06-08 ENCOUNTER — Encounter (HOSPITAL_COMMUNITY)
Admission: RE | Admit: 2021-06-08 | Discharge: 2021-06-08 | Disposition: A | Discharge status: Home / Self Care | Payer: Self-pay | Source: Ambulatory Visit | Attending: Cardiology | Admitting: Cardiology

## 2021-06-08 ENCOUNTER — Other Ambulatory Visit: Payer: Self-pay

## 2021-06-09 ENCOUNTER — Ambulatory Visit (INDEPENDENT_AMBULATORY_CARE_PROVIDER_SITE_OTHER): Payer: Medicare Other

## 2021-06-09 DIAGNOSIS — I441 Atrioventricular block, second degree: Secondary | ICD-10-CM

## 2021-06-09 LAB — CUP PACEART REMOTE DEVICE CHECK
Battery Remaining Longevity: 53 mo
Battery Remaining Percentage: 47 %
Battery Voltage: 2.96 V
Brady Statistic AP VP Percent: 1.8 %
Brady Statistic AP VS Percent: 1 %
Brady Statistic AS VP Percent: 98 %
Brady Statistic AS VS Percent: 1 %
Brady Statistic RA Percent Paced: 1.6 %
Date Time Interrogation Session: 20220819023459
Implantable Lead Implant Date: 20170406
Implantable Lead Implant Date: 20170406
Implantable Lead Implant Date: 20170406
Implantable Lead Location: 753858
Implantable Lead Location: 753859
Implantable Lead Location: 753860
Implantable Pulse Generator Implant Date: 20170406
Lead Channel Impedance Value: 450 Ohm
Lead Channel Impedance Value: 480 Ohm
Lead Channel Impedance Value: 810 Ohm
Lead Channel Pacing Threshold Amplitude: 0.5 V
Lead Channel Pacing Threshold Amplitude: 1.125 V
Lead Channel Pacing Threshold Amplitude: 1.25 V
Lead Channel Pacing Threshold Pulse Width: 0.4 ms
Lead Channel Pacing Threshold Pulse Width: 0.4 ms
Lead Channel Pacing Threshold Pulse Width: 0.4 ms
Lead Channel Sensing Intrinsic Amplitude: 12 mV
Lead Channel Sensing Intrinsic Amplitude: 2.1 mV
Lead Channel Setting Pacing Amplitude: 2 V
Lead Channel Setting Pacing Amplitude: 2.125
Lead Channel Setting Pacing Amplitude: 2.25 V
Lead Channel Setting Pacing Pulse Width: 0.4 ms
Lead Channel Setting Pacing Pulse Width: 0.4 ms
Lead Channel Setting Sensing Sensitivity: 2 mV
Pulse Gen Model: 3262
Pulse Gen Serial Number: 7866838

## 2021-06-13 ENCOUNTER — Encounter (HOSPITAL_COMMUNITY)
Admission: RE | Admit: 2021-06-13 | Discharge: 2021-06-13 | Disposition: A | Discharge status: Home / Self Care | Payer: Self-pay | Source: Ambulatory Visit | Attending: Cardiology | Admitting: Cardiology

## 2021-06-13 ENCOUNTER — Other Ambulatory Visit: Payer: Self-pay

## 2021-06-15 ENCOUNTER — Encounter (HOSPITAL_COMMUNITY): Payer: Self-pay

## 2021-06-20 ENCOUNTER — Other Ambulatory Visit: Payer: Self-pay

## 2021-06-20 ENCOUNTER — Encounter (HOSPITAL_COMMUNITY)
Admission: RE | Admit: 2021-06-20 | Discharge: 2021-06-20 | Disposition: A | Discharge status: Home / Self Care | Payer: Self-pay | Source: Ambulatory Visit | Attending: Cardiology | Admitting: Cardiology

## 2021-06-22 ENCOUNTER — Other Ambulatory Visit: Payer: Self-pay

## 2021-06-22 ENCOUNTER — Encounter (HOSPITAL_COMMUNITY)
Admission: RE | Admit: 2021-06-22 | Discharge: 2021-06-22 | Disposition: A | Discharge status: Home / Self Care | Payer: Self-pay | Source: Ambulatory Visit | Attending: Cardiology | Admitting: Cardiology

## 2021-06-22 DIAGNOSIS — I441 Atrioventricular block, second degree: Secondary | ICD-10-CM | POA: Insufficient documentation

## 2021-06-24 NOTE — Progress Notes (Signed)
Remote pacemaker transmission.   

## 2021-06-27 ENCOUNTER — Other Ambulatory Visit: Payer: Self-pay

## 2021-06-27 ENCOUNTER — Encounter (HOSPITAL_COMMUNITY)
Admission: RE | Admit: 2021-06-27 | Discharge: 2021-06-27 | Disposition: A | Discharge status: Home / Self Care | Payer: Self-pay | Source: Ambulatory Visit | Attending: Cardiology | Admitting: Cardiology

## 2021-06-28 ENCOUNTER — Other Ambulatory Visit (HOSPITAL_COMMUNITY): Payer: Self-pay | Admitting: Cardiology

## 2021-06-29 ENCOUNTER — Encounter (HOSPITAL_COMMUNITY)
Admission: RE | Admit: 2021-06-29 | Discharge: 2021-06-29 | Disposition: A | Discharge status: Home / Self Care | Payer: Self-pay | Source: Ambulatory Visit | Attending: Cardiology | Admitting: Cardiology

## 2021-06-29 ENCOUNTER — Other Ambulatory Visit: Payer: Self-pay

## 2021-07-04 ENCOUNTER — Encounter (HOSPITAL_COMMUNITY): Payer: Self-pay

## 2021-07-05 ENCOUNTER — Ambulatory Visit (HOSPITAL_COMMUNITY)
Admission: RE | Admit: 2021-07-05 | Discharge: 2021-07-05 | Disposition: A | Discharge status: Home / Self Care | Payer: Medicare Other | Source: Ambulatory Visit | Attending: Cardiology | Admitting: Cardiology

## 2021-07-05 ENCOUNTER — Other Ambulatory Visit: Payer: Self-pay

## 2021-07-05 ENCOUNTER — Encounter (HOSPITAL_COMMUNITY): Payer: Self-pay | Admitting: Cardiology

## 2021-07-05 VITALS — BP 120/60 | HR 54 | Wt 166.8 lb

## 2021-07-05 DIAGNOSIS — I5022 Chronic systolic (congestive) heart failure: Secondary | ICD-10-CM | POA: Diagnosis not present

## 2021-07-05 DIAGNOSIS — Z955 Presence of coronary angioplasty implant and graft: Secondary | ICD-10-CM | POA: Insufficient documentation

## 2021-07-05 DIAGNOSIS — R42 Dizziness and giddiness: Secondary | ICD-10-CM | POA: Insufficient documentation

## 2021-07-05 DIAGNOSIS — Z87891 Personal history of nicotine dependence: Secondary | ICD-10-CM | POA: Insufficient documentation

## 2021-07-05 DIAGNOSIS — M25571 Pain in right ankle and joints of right foot: Secondary | ICD-10-CM | POA: Diagnosis not present

## 2021-07-05 DIAGNOSIS — M25572 Pain in left ankle and joints of left foot: Secondary | ICD-10-CM | POA: Diagnosis not present

## 2021-07-05 DIAGNOSIS — M459 Ankylosing spondylitis of unspecified sites in spine: Secondary | ICD-10-CM | POA: Diagnosis not present

## 2021-07-05 DIAGNOSIS — M25561 Pain in right knee: Secondary | ICD-10-CM | POA: Insufficient documentation

## 2021-07-05 DIAGNOSIS — R0789 Other chest pain: Secondary | ICD-10-CM | POA: Insufficient documentation

## 2021-07-05 DIAGNOSIS — M25562 Pain in left knee: Secondary | ICD-10-CM | POA: Diagnosis not present

## 2021-07-05 DIAGNOSIS — M549 Dorsalgia, unspecified: Secondary | ICD-10-CM | POA: Insufficient documentation

## 2021-07-05 DIAGNOSIS — I2510 Atherosclerotic heart disease of native coronary artery without angina pectoris: Secondary | ICD-10-CM | POA: Insufficient documentation

## 2021-07-05 DIAGNOSIS — Z7901 Long term (current) use of anticoagulants: Secondary | ICD-10-CM | POA: Diagnosis not present

## 2021-07-05 DIAGNOSIS — Z7902 Long term (current) use of antithrombotics/antiplatelets: Secondary | ICD-10-CM | POA: Diagnosis not present

## 2021-07-05 DIAGNOSIS — I447 Left bundle-branch block, unspecified: Secondary | ICD-10-CM | POA: Diagnosis not present

## 2021-07-05 DIAGNOSIS — I255 Ischemic cardiomyopathy: Secondary | ICD-10-CM | POA: Insufficient documentation

## 2021-07-05 DIAGNOSIS — I6529 Occlusion and stenosis of unspecified carotid artery: Secondary | ICD-10-CM

## 2021-07-05 DIAGNOSIS — I5042 Chronic combined systolic (congestive) and diastolic (congestive) heart failure: Secondary | ICD-10-CM | POA: Insufficient documentation

## 2021-07-05 DIAGNOSIS — Z7982 Long term (current) use of aspirin: Secondary | ICD-10-CM | POA: Diagnosis not present

## 2021-07-05 DIAGNOSIS — I2582 Chronic total occlusion of coronary artery: Secondary | ICD-10-CM | POA: Insufficient documentation

## 2021-07-05 DIAGNOSIS — Z79899 Other long term (current) drug therapy: Secondary | ICD-10-CM | POA: Diagnosis not present

## 2021-07-05 DIAGNOSIS — I252 Old myocardial infarction: Secondary | ICD-10-CM | POA: Insufficient documentation

## 2021-07-05 LAB — LIPID PANEL
Cholesterol: 115 mg/dL (ref 0–200)
HDL: 56 mg/dL (ref >40)
LDL Cholesterol: 44 mg/dL (ref 0–99)
Total CHOL/HDL Ratio: 2.1 RATIO
Triglycerides: 76 mg/dL (ref <150)
VLDL: 15 mg/dL (ref 0–40)

## 2021-07-05 LAB — BASIC METABOLIC PANEL
Anion gap: 7 (ref 5–15)
BUN: 17 mg/dL (ref 8–23)
CO2: 25 mmol/L (ref 22–32)
Calcium: 8.8 mg/dL — ABNORMAL LOW (ref 8.9–10.3)
Chloride: 100 mmol/L (ref 98–111)
Creatinine, Ser: 0.82 mg/dL (ref 0.61–1.24)
GFR, Estimated: >60 mL/min (ref >60)
Glucose, Bld: 101 mg/dL — ABNORMAL HIGH (ref 70–99)
Potassium: 3.6 mmol/L (ref 3.5–5.1)
Sodium: 132 mmol/L — ABNORMAL LOW (ref 135–145)

## 2021-07-05 NOTE — Patient Instructions (Signed)
Labs today We will only contact you if something comes back abnormal or we need to make some changes. Otherwise no news is good news!  Your physician has requested that you have an echocardiogram. Echocardiography is a painless test that uses sound waves to create images of your heart. It provides your doctor with information about the size and shape of your heart and how well your heart's chambers and valves are working. This procedure takes approximately one hour. There are no restrictions for this procedure.  Your physician has requested that you have a carotid duplex. This test is an ultrasound of the carotid arteries in your neck. It looks at blood flow through these arteries that supply the brain with blood. Allow one hour for this exam. There are no restrictions or special instructions.  Your physician recommends that you schedule a follow-up appointment in: 6 months with Dr Aundra Dubin.  You will get a call to schedule your tests as well as your follow up appointment with Dr Aundra Dubin  Please call office at [phone] option [phone] if you have any questions or concerns.   At the [facility], you and your health needs are our priority. As part of our continuing mission to provide you with exceptional heart care, we have created designated Provider Care Teams. These Care Teams include your primary Cardiologist (physician) and Advanced Practice Providers (APPs- Physician Assistants and Nurse Practitioners) who all work together to provide you with the care you need, when you need it.   You may see any of the following providers on your designated Care Team at your next follow up: Dr Glori Bickers Dr Loralie Champagne Dr Patrice Paradise, NP Lyda Jester, [place] Ginnie Smart Audry Riles, PharmD   Please be sure to bring in all your medications bottles to every appointment.

## 2021-07-05 NOTE — Progress Notes (Signed)
Patient ID: Henry Neal, male   DOB: 02-03-1927, 87 y.o.   MRN: [id number]      Advanced Heart Failure Clinic Note  PCP: Dr. 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 male with history of CAD, chronic systolic CHF, LBBB, AAA s/p repair presents for followup of CHF and CAD.  He had a cardiac cath in 12/15, showing total occlusion of the LAD and 50-60% proximal RCA.  EF at that time was near-normal.  In 10/16, he developed dyspnea and epigastric pain.  He was noted to be in acute pulmonary edema and was diuresed.  Troponin was only mildly elevated at 0.47.  He ultimately underwent LHC showing hazy 80-90% RCA stenosis which was likely the culprit for his presentation.  Additionally, the LAD was only subtotally occluded on this cath.  He had DES to RCA.  Cardiac MRI was done, showing EF 22% with significant viability in LAD territory.  Therefore, CTO of LAD was opened with DES.  Echo in 2/17 showed EF persistently low at 15%.  He had St Jude CRT-P placement in 4/17. Echo in 1/19 showed EF up to 45-50% with mild MR.    Echo in 3/20 showed EF 45% with mild diffuse hypokinesis, mildly decreased RV systolic function. Echo in 3/21 showed EF 45-50%, mild RV dilation with normal systolic function.   He was diagnosed with ankylosing spondylitis since I last saw him and is now on methotrexate.  He has back pain as well as pain in his knees and ankles.  Orthopedic pain is his main limitation.  He is not getting short of breath though he does not walk long distances.  No falls.  No lightheadedness though he has rare vertigo symptoms (has had for years).  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  Rare atypical chest pain.  No orthopnea/PND.  Weight down 5 lbs.     St Jude device interrogation: >99% BiV pacing, stable thoracic impedance.   Labs (10/16): hgb 9.7, K 4.6, creatinine 1.12 Labs (11/16): K 5.9, creatinine 1.5 Labs (12/16); K 4.2, creatinine 1.07 => 1.02, BNP 448 Labs (1/17):  K 4.1, creatinine 1.13, BNP 423, LDL 40, HDL 47 Labs (4/17): K 3.9, creatinine 1.03, HCT 32.6 Labs (5/17): K 4.4, creatinine 1.09 Labs (1/18): LDL 37 Labs (01/25/2017): K 3.5 Creatinine 0.96  Labs (8/18): BNP 30, K 3.7, creatinine 1.0 Labs (11/18): LDL 40, HDL 47, K 3.5, creatinine 0.96  Labs (3/19): K 4, creatinine 0.97 Labs (11/19): K 3.9, creatinine 1.2, LDL 44 Labs (4/20): K 4.1, creatinine 1.11 Labs (10/20): LDL 46 Labs (11/20): K 4.4, creatinine 1.05 Labs (3/21): K 4.2, creatinine 0.92 Labs (7/21): LDL 58 Labs (8/21): K 4, creatinine 1.27 Labs (11/21): K 3.9, creatinine 0.88  PMH: 1. CKD 2. AAA s/p repair, followed at [facility]. 3. Carotid stenosis: s/p right CEA.   Carotid dopplers (2/16) with right CEA stable, < [id number] LICA stenosis.  Followed at [facility].  - Carotid dopplers ([id number]): [id number] LICA, patent right CEA.  4. Chronic LBBB 5. LAD: LHC (12/15) with totally occluded LAD, 50-60% pRCA => medically managed.  10/16 admitted with NSTEMI.  LHC with subtotalled LAD occlusion, 80-90% pRCA with thrombus => DES RCA initially.  Cardiac MRI was done showing viability in the LAD territory, so he had DES to the CTO LAD.   6. Chronic systolic CHF: Ischemic cardiomyopathy.  Echo (10/16) with EF 15% with wall motion abnormalities, moderately dilated RV with normal systolic  function.  CMRI (10/16) with severe LV dilation, EF 22% with septal-lateral dyssynchrony, no LV thrombus, normal RV size/systolic function => significant viability noted in the LAD territory.  Echo (2/17): EF 15%, severe LV dilation, wall motion abnormalities noted, mild MR, mildly dilated RV with normal systolic function, PASP 55 mmHg.  St Jude CRT-P 4/17.  - Echo (1/18): EF 50-55%, mild to moderate MR.  - Echo (1/19): EF 45-50%, mild LVH, inferior hypokinesis, mild MR, PASP 30 mmHg.  - Echo (3/20): EF 45%, diffuse hypokinesis, normal RV size with mildly decreased systolic function.  - Echo (3/21): EF 45-50%, mildly dilated RV with normal  systolic function.  7. ABIs (10/16) were normal.  8. Raynaud's syndrome.  9. Sciatica  SH: Lives alone, prior smoker, still working as a Furniture conservator/restorer.  FH: CAD  Review of systems complete and found to be negative unless listed in HPI.    Current Outpatient Medications  Medication Sig Dispense Refill   acetaminophen (TYLENOL) 500 MG tablet Take 1,000 mg by mouth daily as needed for mild pain or headache. Reported on 01/25/2016     aspirin EC 81 MG tablet Take 81 mg by mouth daily.     atorvastatin (LIPITOR) 40 MG tablet Take 1 tablet (40 mg total) by mouth daily at 6 PM. 30 tablet 6   Calcium Carbonate-Vitamin D (CALCIUM 600 + D PO) Take 1 tablet by mouth 2 (two) times daily.     carvedilol (COREG) 12.5 MG tablet Take 1 tablet (12.5 mg total) by mouth 2 (two) times daily. 60 tablet 11   clopidogrel (PLAVIX) 75 MG tablet TAKE 1 TABLET(75 MG) BY MOUTH DAILY 90 tablet 3   clotrimazole-betamethasone (LOTRISONE) cream APPLY TO AA BID  1   finasteride (PROSCAR) 5 MG tablet take 1 tablet by mouth once daily     fluticasone (FLONASE) 50 MCG/ACT nasal spray Place 1 spray into both nostrils daily as needed for allergies.      folic acid (FOLVITE) 1 MG tablet Take 1 mg by mouth daily.     furosemide (LASIX) 20 MG tablet TAKE 1 TABLET(20 MG) BY MOUTH EVERY OTHER DAY 15 tablet 6   gabapentin (NEURONTIN) 300 MG capsule Take 300 mg by mouth 2 (two) times daily.  0   meclizine (ANTIVERT) 25 MG tablet Take 1 tablet (25 mg total) by mouth 2 (two) times daily as needed for dizziness. 30 tablet 0   meloxicam (MOBIC) 15 MG tablet Take 15 mg by mouth daily as needed for pain.  0   methotrexate (RHEUMATREX) 2.5 MG tablet Take 2.5 mg by mouth once a week. Caution:Chemotherapy. Protect from light. Takes 7 tablets     mometasone (ELOCON) 0.1 % cream Apply 1 application topically daily as needed (for irritation).      Multiple Vitamin (MULTIVITAMIN PO) Take 1 tablet by mouth daily. ALIVE men's vitamin     nitroGLYCERIN  (NITROSTAT) 0.4 MG SL tablet PLACE 1 TABLET UNDER THE TONGUE IF NEEDED EVERY 5 MINUTES FOR CHEST PAIN FOR 3 DOSES IF NO RELIEF AFTER FIRST DOSE CALL PRESCRIBER OR 911 25 tablet 3   ondansetron (ZOFRAN) 4 MG tablet Take 4 mg by mouth 3 (three) times daily as needed.     pantoprazole (PROTONIX) 40 MG tablet Take 40 mg by mouth 2 (two) times daily.   0   polyethylene glycol (MIRALAX / GLYCOLAX) packet Take 17 g by mouth daily as needed for mild constipation.      sacubitril-valsartan (ENTRESTO) 24-26 MG Take  1 tablet by mouth 2 (two) times daily. Needs follow up appt for further refills. Please call [phone] to schedule an appt. 60 tablet 2   spironolactone (ALDACTONE) 25 MG tablet TAKE 1 TABLET(25 MG) BY MOUTH DAILY 90 tablet 3   sulfaSALAzine (AZULFIDINE) 500 MG tablet Take 500 mg by mouth 2 (two) times daily.      No current facility-administered medications for this encounter.   BP 120/60   Pulse (!) 54   Wt 75.7 kg (166 lb 12.8 oz)   SpO2 93%   BMI 23.93 kg/m   Wt Readings from Last 3 Encounters:  07/05/21 75.7 kg (166 lb 12.8 oz)  09/20/20 77.6 kg (171 lb)  09/06/20 77.5 kg (170 lb 12.8 oz)    General: NAD Neck: No JVD, no thyromegaly or thyroid nodule.  Lungs: Clear to auscultation bilaterally with normal respiratory effort. CV: Nondisplaced PMI.  Heart regular S1/S2, no S3/S4, no murmur.  No peripheral edema.  No carotid bruit.  Normal pedal pulses.  Abdomen: Soft, nontender, no hepatosplenomegaly, no distention.  Skin: Intact without lesions or rashes.  Neurologic: Alert and oriented x 3.  Psych: Normal affect. Extremities: No clubbing or cyanosis.  HEENT: Normal.   Assessment/Plan: 1. CAD: NSTEMI with DES to RCA (culprit vessel) and subsequent DES to CTO of LAD (cMRI showed viability in the LAD distribution) in 10/16. Rare atypical chest pain, likely noncardiac.  - Continue ASA 81 and Plavix 75.  - atorvastatin 40 daily, check lipids today.  2. Chronic  systolic/diastolic heart failure: Ischemic cardiomyopathy. Echo in 3/21 showed EF 45-50%, improved since getting CRT.  St Jude CRT-D.  NYHA class II.  Volume status looks ok on exam and by [place].   - Continue Lasix 20 mg qod. BMET today.  - Continue Coreg 12.5 mg bid  - Continue spironolactone 25 mg daily.   - Continue Entresto 24/26 mg BID.  - I will arrange for echocardiogram.  3. Carotid stenosis:  - Has not been seen for a while at [facility], will arrange for carotid dopplers.   Followup in 6 months if echo stable.   Loralie Champagne, MD  07/05/2021

## 2021-07-06 ENCOUNTER — Encounter (HOSPITAL_COMMUNITY)
Admission: RE | Admit: 2021-07-06 | Discharge: 2021-07-06 | Disposition: A | Discharge status: Home / Self Care | Payer: Self-pay | Source: Ambulatory Visit | Attending: Cardiology | Admitting: Cardiology

## 2021-07-07 DIAGNOSIS — E782 Mixed hyperlipidemia: Secondary | ICD-10-CM | POA: Diagnosis not present

## 2021-07-07 DIAGNOSIS — N400 Enlarged prostate without lower urinary tract symptoms: Secondary | ICD-10-CM | POA: Diagnosis not present

## 2021-07-11 ENCOUNTER — Encounter (HOSPITAL_COMMUNITY)
Admission: RE | Admit: 2021-07-11 | Discharge: 2021-07-11 | Disposition: A | Discharge status: Home / Self Care | Payer: Self-pay | Source: Ambulatory Visit | Attending: Cardiology | Admitting: Cardiology

## 2021-07-11 ENCOUNTER — Other Ambulatory Visit: Payer: Self-pay

## 2021-07-13 ENCOUNTER — Encounter (HOSPITAL_COMMUNITY): Payer: Self-pay

## 2021-07-14 ENCOUNTER — Other Ambulatory Visit: Payer: Self-pay

## 2021-07-14 ENCOUNTER — Ambulatory Visit (HOSPITAL_COMMUNITY)
Admission: RE | Admit: 2021-07-14 | Discharge: 2021-07-14 | Disposition: A | Discharge status: Home / Self Care | Payer: Medicare Other | Source: Ambulatory Visit | Attending: Cardiovascular Disease | Admitting: Cardiovascular Disease

## 2021-07-14 ENCOUNTER — Other Ambulatory Visit (HOSPITAL_COMMUNITY): Payer: Self-pay | Admitting: Cardiology

## 2021-07-14 ENCOUNTER — Ambulatory Visit (HOSPITAL_COMMUNITY)
Admission: RE | Admit: 2021-07-14 | Payer: Medicare Other | Source: Ambulatory Visit | Attending: Cardiology | Admitting: Cardiology

## 2021-07-14 DIAGNOSIS — I6529 Occlusion and stenosis of unspecified carotid artery: Secondary | ICD-10-CM

## 2021-07-14 DIAGNOSIS — I6522 Occlusion and stenosis of left carotid artery: Secondary | ICD-10-CM | POA: Diagnosis not present

## 2021-07-14 DIAGNOSIS — I5022 Chronic systolic (congestive) heart failure: Secondary | ICD-10-CM | POA: Diagnosis not present

## 2021-07-14 DIAGNOSIS — I739 Peripheral vascular disease, unspecified: Secondary | ICD-10-CM | POA: Diagnosis not present

## 2021-07-14 DIAGNOSIS — I6523 Occlusion and stenosis of bilateral carotid arteries: Secondary | ICD-10-CM

## 2021-07-14 DIAGNOSIS — N182 Chronic kidney disease, stage 2 (mild): Secondary | ICD-10-CM | POA: Diagnosis not present

## 2021-07-14 DIAGNOSIS — Z9889 Other specified postprocedural states: Secondary | ICD-10-CM

## 2021-07-14 DIAGNOSIS — I700 Atherosclerosis of aorta: Secondary | ICD-10-CM | POA: Diagnosis not present

## 2021-07-18 ENCOUNTER — Encounter (HOSPITAL_COMMUNITY)
Admission: RE | Admit: 2021-07-18 | Discharge: 2021-07-18 | Disposition: A | Discharge status: Home / Self Care | Payer: Self-pay | Source: Ambulatory Visit | Attending: Cardiology | Admitting: Cardiology

## 2021-07-18 ENCOUNTER — Other Ambulatory Visit: Payer: Self-pay

## 2021-07-20 ENCOUNTER — Other Ambulatory Visit: Payer: Self-pay

## 2021-07-20 ENCOUNTER — Encounter (HOSPITAL_COMMUNITY)
Admission: RE | Admit: 2021-07-20 | Discharge: 2021-07-20 | Disposition: A | Discharge status: Home / Self Care | Payer: Self-pay | Source: Ambulatory Visit | Attending: Cardiology | Admitting: Cardiology

## 2021-07-21 DIAGNOSIS — I5022 Chronic systolic (congestive) heart failure: Secondary | ICD-10-CM | POA: Diagnosis not present

## 2021-07-21 DIAGNOSIS — E782 Mixed hyperlipidemia: Secondary | ICD-10-CM | POA: Diagnosis not present

## 2021-07-21 DIAGNOSIS — I130 Hypertensive heart and chronic kidney disease with heart failure and stage 1 through stage 4 chronic kidney disease, or unspecified chronic kidney disease: Secondary | ICD-10-CM | POA: Diagnosis not present

## 2021-07-25 ENCOUNTER — Encounter (HOSPITAL_COMMUNITY)
Admission: RE | Admit: 2021-07-25 | Discharge: 2021-07-25 | Disposition: A | Discharge status: Home / Self Care | Payer: Self-pay | Source: Ambulatory Visit | Attending: Cardiology | Admitting: Cardiology

## 2021-07-25 ENCOUNTER — Other Ambulatory Visit: Payer: Self-pay

## 2021-07-25 DIAGNOSIS — I441 Atrioventricular block, second degree: Secondary | ICD-10-CM | POA: Insufficient documentation

## 2021-07-27 ENCOUNTER — Other Ambulatory Visit: Payer: Self-pay

## 2021-07-27 ENCOUNTER — Encounter (HOSPITAL_COMMUNITY)
Admission: RE | Admit: 2021-07-27 | Discharge: 2021-07-27 | Disposition: A | Discharge status: Home / Self Care | Payer: Medicare Other | Source: Ambulatory Visit | Attending: Cardiology | Admitting: Cardiology

## 2021-07-28 ENCOUNTER — Ambulatory Visit (HOSPITAL_COMMUNITY)
Admission: RE | Admit: 2021-07-28 | Discharge: 2021-07-28 | Disposition: A | Discharge status: Home / Self Care | Payer: Medicare Other | Source: Ambulatory Visit | Attending: Internal Medicine | Admitting: Internal Medicine

## 2021-07-28 DIAGNOSIS — I714 Abdominal aortic aneurysm, without rupture, unspecified: Secondary | ICD-10-CM | POA: Insufficient documentation

## 2021-07-28 DIAGNOSIS — I2510 Atherosclerotic heart disease of native coronary artery without angina pectoris: Secondary | ICD-10-CM | POA: Diagnosis not present

## 2021-07-28 DIAGNOSIS — I5022 Chronic systolic (congestive) heart failure: Secondary | ICD-10-CM | POA: Insufficient documentation

## 2021-07-28 DIAGNOSIS — I082 Rheumatic disorders of both aortic and tricuspid valves: Secondary | ICD-10-CM | POA: Insufficient documentation

## 2021-07-28 LAB — ECHOCARDIOGRAM COMPLETE
AV Mean grad: 3 mmHg
AV Peak grad: 6.2 mmHg
Ao pk vel: 1.24 m/s
Area-P 1/2: 2.19 cm2
Calc EF: 54.5 %
S' Lateral: 3.2 cm
Single Plane A2C EF: 57.5 %
Single Plane A4C EF: 54.7 %

## 2021-07-28 NOTE — Progress Notes (Signed)
  Echocardiogram 2D Echocardiogram has been performed.  Henry Neal F 07/28/2021, 9:09 AM

## 2021-07-30 ENCOUNTER — Other Ambulatory Visit (HOSPITAL_COMMUNITY): Payer: Self-pay | Admitting: Cardiology

## 2021-08-01 ENCOUNTER — Other Ambulatory Visit: Payer: Self-pay

## 2021-08-01 ENCOUNTER — Encounter (HOSPITAL_COMMUNITY)
Admission: RE | Admit: 2021-08-01 | Discharge: 2021-08-01 | Disposition: A | Discharge status: Home / Self Care | Payer: Self-pay | Source: Ambulatory Visit | Attending: Cardiology | Admitting: Cardiology

## 2021-08-03 ENCOUNTER — Encounter (HOSPITAL_COMMUNITY)
Admission: RE | Admit: 2021-08-03 | Discharge: 2021-08-03 | Disposition: A | Discharge status: Home / Self Care | Payer: Self-pay | Source: Ambulatory Visit | Attending: Cardiology | Admitting: Cardiology

## 2021-08-03 ENCOUNTER — Other Ambulatory Visit: Payer: Self-pay

## 2021-08-04 DIAGNOSIS — Z79899 Other long term (current) drug therapy: Secondary | ICD-10-CM | POA: Diagnosis not present

## 2021-08-04 DIAGNOSIS — I5022 Chronic systolic (congestive) heart failure: Secondary | ICD-10-CM | POA: Diagnosis not present

## 2021-08-04 DIAGNOSIS — M459 Ankylosing spondylitis of unspecified sites in spine: Secondary | ICD-10-CM | POA: Diagnosis not present

## 2021-08-04 DIAGNOSIS — M8580 Other specified disorders of bone density and structure, unspecified site: Secondary | ICD-10-CM | POA: Diagnosis not present

## 2021-08-08 ENCOUNTER — Other Ambulatory Visit: Payer: Self-pay

## 2021-08-08 ENCOUNTER — Encounter (HOSPITAL_COMMUNITY)
Admission: RE | Admit: 2021-08-08 | Discharge: 2021-08-08 | Disposition: A | Discharge status: Home / Self Care | Payer: Medicare Other | Source: Ambulatory Visit | Attending: Cardiology | Admitting: Cardiology

## 2021-08-10 ENCOUNTER — Encounter (HOSPITAL_COMMUNITY)
Admission: RE | Admit: 2021-08-10 | Discharge: 2021-08-10 | Disposition: A | Discharge status: Home / Self Care | Payer: Self-pay | Source: Ambulatory Visit | Attending: Cardiology | Admitting: Cardiology

## 2021-08-10 ENCOUNTER — Other Ambulatory Visit: Payer: Self-pay

## 2021-08-11 ENCOUNTER — Ambulatory Visit (INDEPENDENT_AMBULATORY_CARE_PROVIDER_SITE_OTHER): Payer: Medicare Other | Admitting: Podiatry

## 2021-08-11 ENCOUNTER — Encounter: Payer: Self-pay | Admitting: Podiatry

## 2021-08-11 DIAGNOSIS — M79676 Pain in unspecified toe(s): Secondary | ICD-10-CM

## 2021-08-11 DIAGNOSIS — B351 Tinea unguium: Secondary | ICD-10-CM

## 2021-08-11 DIAGNOSIS — D689 Coagulation defect, unspecified: Secondary | ICD-10-CM

## 2021-08-11 DIAGNOSIS — I739 Peripheral vascular disease, unspecified: Secondary | ICD-10-CM

## 2021-08-11 NOTE — Progress Notes (Signed)
This patient returns to my office for at risk foot care.  This patient requires this care by a professional since this patient will be at risk due to having renal insufficiency coagulation defect and PAD.   Patient is taking plavix.    This patient is unable to cut nails himself since the patient cannot reach his nails.These nails are painful walking and wearing shoes.  This patient presents for at risk foot care today.  General Appearance  Alert, conversant and in no acute stress.  Vascular  Dorsalis pedis and posterior tibial  pulses are weakly  palpable  bilaterally.  Capillary return is within normal limits  bilaterally. Temperature is within normal limits  bilaterally.  Neurologic  Senn-Weinstein monofilament wire test within normal limits  bilaterally. Muscle power within normal limits bilaterally.  Nails Thick disfigured discolored nails with subungual debris  from hallux to fifth toes bilaterally. No evidence of bacterial infection or drainage bilaterally.  Orthopedic  No limitations of motion  feet .  No crepitus or effusions noted.  No bony pathology or digital deformities noted.  Skin  normotropic skin with no porokeratosis noted bilaterally.  No signs of infections or ulcers noted.     Onychomycosis  Pain in right toes  Pain in left toes  Consent was obtained for treatment procedures.   Mechanical debridement of nails 1-5  bilaterally performed with a nail nipper.  Filed with dremel without incident.    Return office visit   10 weeks                   Told patient to return for periodic foot care and evaluation due to potential at risk complications.   Tanyla Stege DPM  

## 2021-08-15 ENCOUNTER — Other Ambulatory Visit: Payer: Self-pay

## 2021-08-15 ENCOUNTER — Encounter (HOSPITAL_COMMUNITY)
Admission: RE | Admit: 2021-08-15 | Discharge: 2021-08-15 | Disposition: A | Discharge status: Home / Self Care | Payer: Self-pay | Source: Ambulatory Visit | Attending: Cardiology | Admitting: Cardiology

## 2021-08-17 ENCOUNTER — Other Ambulatory Visit: Payer: Self-pay

## 2021-08-17 ENCOUNTER — Encounter (HOSPITAL_COMMUNITY)
Admission: RE | Admit: 2021-08-17 | Discharge: 2021-08-17 | Disposition: A | Discharge status: Home / Self Care | Payer: Medicare Other | Source: Ambulatory Visit | Attending: Cardiology | Admitting: Cardiology

## 2021-08-22 ENCOUNTER — Other Ambulatory Visit: Payer: Self-pay

## 2021-08-22 ENCOUNTER — Encounter (HOSPITAL_COMMUNITY)
Admission: RE | Admit: 2021-08-22 | Discharge: 2021-08-22 | Disposition: A | Discharge status: Home / Self Care | Payer: Medicare Other | Source: Ambulatory Visit | Attending: Cardiology | Admitting: Cardiology

## 2021-08-22 DIAGNOSIS — I441 Atrioventricular block, second degree: Secondary | ICD-10-CM | POA: Diagnosis present

## 2021-08-24 ENCOUNTER — Encounter (HOSPITAL_COMMUNITY)
Admission: RE | Admit: 2021-08-24 | Discharge: 2021-08-24 | Disposition: A | Discharge status: Home / Self Care | Payer: Medicare Other | Source: Ambulatory Visit | Attending: Cardiology | Admitting: Cardiology

## 2021-08-24 ENCOUNTER — Other Ambulatory Visit: Payer: Self-pay

## 2021-08-29 ENCOUNTER — Other Ambulatory Visit: Payer: Self-pay

## 2021-08-29 ENCOUNTER — Encounter (HOSPITAL_COMMUNITY)
Admission: RE | Admit: 2021-08-29 | Discharge: 2021-08-29 | Disposition: A | Discharge status: Home / Self Care | Payer: Medicare Other | Source: Ambulatory Visit | Attending: Cardiology | Admitting: Cardiology

## 2021-08-29 DIAGNOSIS — I441 Atrioventricular block, second degree: Secondary | ICD-10-CM | POA: Diagnosis not present

## 2021-08-31 ENCOUNTER — Other Ambulatory Visit: Payer: Self-pay

## 2021-08-31 ENCOUNTER — Encounter (HOSPITAL_COMMUNITY)
Admission: RE | Admit: 2021-08-31 | Discharge: 2021-08-31 | Disposition: A | Discharge status: Home / Self Care | Payer: Medicare Other | Source: Ambulatory Visit | Attending: Cardiology | Admitting: Cardiology

## 2021-09-05 ENCOUNTER — Encounter (HOSPITAL_COMMUNITY)
Admission: RE | Admit: 2021-09-05 | Discharge: 2021-09-05 | Disposition: A | Discharge status: Home / Self Care | Payer: Medicare Other | Source: Ambulatory Visit | Attending: Cardiology | Admitting: Cardiology

## 2021-09-05 ENCOUNTER — Other Ambulatory Visit: Payer: Self-pay

## 2021-09-07 ENCOUNTER — Other Ambulatory Visit: Payer: Self-pay

## 2021-09-07 ENCOUNTER — Encounter (HOSPITAL_COMMUNITY)
Admission: RE | Admit: 2021-09-07 | Discharge: 2021-09-07 | Disposition: A | Discharge status: Home / Self Care | Payer: Medicare Other | Source: Ambulatory Visit | Attending: Cardiology | Admitting: Cardiology

## 2021-09-08 ENCOUNTER — Other Ambulatory Visit (HOSPITAL_COMMUNITY): Payer: Self-pay | Admitting: Cardiology

## 2021-09-08 ENCOUNTER — Ambulatory Visit (INDEPENDENT_AMBULATORY_CARE_PROVIDER_SITE_OTHER): Payer: Medicare Other

## 2021-09-08 ENCOUNTER — Telehealth: Payer: Self-pay | Admitting: Internal Medicine

## 2021-09-08 DIAGNOSIS — I441 Atrioventricular block, second degree: Secondary | ICD-10-CM | POA: Diagnosis not present

## 2021-09-08 LAB — CUP PACEART REMOTE DEVICE CHECK
Battery Remaining Longevity: 49 mo
Battery Remaining Percentage: 45 %
Battery Voltage: 2.96 V
Brady Statistic AP VP Percent: 1.9 %
Brady Statistic AP VS Percent: 1 %
Brady Statistic AS VP Percent: 98 %
Brady Statistic AS VS Percent: 1 %
Brady Statistic RA Percent Paced: 1.6 %
Date Time Interrogation Session: 20221118021448
Implantable Lead Implant Date: 20170406
Implantable Lead Implant Date: 20170406
Implantable Lead Implant Date: 20170406
Implantable Lead Location: 753858
Implantable Lead Location: 753859
Implantable Lead Location: 753860
Implantable Pulse Generator Implant Date: 20170406
Lead Channel Impedance Value: 460 Ohm
Lead Channel Impedance Value: 480 Ohm
Lead Channel Impedance Value: 800 Ohm
Lead Channel Pacing Threshold Amplitude: 0.5 V
Lead Channel Pacing Threshold Amplitude: 1 V
Lead Channel Pacing Threshold Amplitude: 1.25 V
Lead Channel Pacing Threshold Pulse Width: 0.4 ms
Lead Channel Pacing Threshold Pulse Width: 0.4 ms
Lead Channel Pacing Threshold Pulse Width: 0.4 ms
Lead Channel Sensing Intrinsic Amplitude: 12 mV
Lead Channel Sensing Intrinsic Amplitude: 2.5 mV
Lead Channel Setting Pacing Amplitude: 2 V
Lead Channel Setting Pacing Amplitude: 2 V
Lead Channel Setting Pacing Amplitude: 2.25 V
Lead Channel Setting Pacing Pulse Width: 0.4 ms
Lead Channel Setting Pacing Pulse Width: 0.4 ms
Lead Channel Setting Sensing Sensitivity: 2 mV
Pulse Gen Model: 3262
Pulse Gen Serial Number: 7866838

## 2021-09-08 MED ORDER — SPIRONOLACTONE 25 MG PO TABS: 25.0000 mg | ORAL_TABLET | Freq: Every day | ORAL | 3 refills | Status: DC

## 2021-09-08 NOTE — Telephone Encounter (Signed)
Called patient back and informed him that we did receive the transmission

## 2021-09-08 NOTE — Telephone Encounter (Signed)
Henry Neal is calling to see if his transmission was received that he sent in today. He says he sent it in around 1:00 PM.

## 2021-09-12 ENCOUNTER — Other Ambulatory Visit: Payer: Self-pay

## 2021-09-12 ENCOUNTER — Encounter (HOSPITAL_COMMUNITY)
Admission: RE | Admit: 2021-09-12 | Discharge: 2021-09-12 | Disposition: A | Discharge status: Home / Self Care | Payer: Medicare Other | Source: Ambulatory Visit | Attending: Cardiology | Admitting: Cardiology

## 2021-09-16 ENCOUNTER — Other Ambulatory Visit (HOSPITAL_COMMUNITY): Payer: Self-pay | Admitting: Cardiology

## 2021-09-18 NOTE — Progress Notes (Signed)
Remote pacemaker transmission.   

## 2021-09-19 ENCOUNTER — Encounter (HOSPITAL_COMMUNITY)
Admission: RE | Admit: 2021-09-19 | Discharge: 2021-09-19 | Disposition: A | Discharge status: Home / Self Care | Payer: Medicare Other | Source: Ambulatory Visit | Attending: Cardiology | Admitting: Cardiology

## 2021-09-19 ENCOUNTER — Other Ambulatory Visit: Payer: Self-pay

## 2021-09-20 DIAGNOSIS — I130 Hypertensive heart and chronic kidney disease with heart failure and stage 1 through stage 4 chronic kidney disease, or unspecified chronic kidney disease: Secondary | ICD-10-CM | POA: Diagnosis not present

## 2021-09-20 DIAGNOSIS — I5022 Chronic systolic (congestive) heart failure: Secondary | ICD-10-CM | POA: Diagnosis not present

## 2021-09-20 DIAGNOSIS — E782 Mixed hyperlipidemia: Secondary | ICD-10-CM | POA: Diagnosis not present

## 2021-09-20 DIAGNOSIS — N182 Chronic kidney disease, stage 2 (mild): Secondary | ICD-10-CM | POA: Diagnosis not present

## 2021-09-21 ENCOUNTER — Encounter (HOSPITAL_COMMUNITY): Payer: Medicare Other

## 2021-09-21 DIAGNOSIS — I441 Atrioventricular block, second degree: Secondary | ICD-10-CM | POA: Diagnosis present

## 2021-09-26 ENCOUNTER — Other Ambulatory Visit: Payer: Self-pay

## 2021-09-26 ENCOUNTER — Encounter (HOSPITAL_COMMUNITY)
Admission: RE | Admit: 2021-09-26 | Discharge: 2021-09-26 | Disposition: A | Discharge status: Home / Self Care | Payer: Medicare Other | Source: Ambulatory Visit | Attending: Cardiology | Admitting: Cardiology

## 2021-09-28 ENCOUNTER — Encounter (HOSPITAL_COMMUNITY)
Admission: RE | Admit: 2021-09-28 | Discharge: 2021-09-28 | Disposition: A | Discharge status: Home / Self Care | Payer: Medicare Other | Source: Ambulatory Visit | Attending: Cardiology | Admitting: Cardiology

## 2021-09-28 ENCOUNTER — Other Ambulatory Visit: Payer: Self-pay

## 2021-10-02 ENCOUNTER — Other Ambulatory Visit (HOSPITAL_COMMUNITY): Payer: Self-pay | Admitting: Cardiology

## 2021-10-03 ENCOUNTER — Encounter (HOSPITAL_COMMUNITY)
Admission: RE | Admit: 2021-10-03 | Discharge: 2021-10-03 | Disposition: A | Discharge status: Home / Self Care | Payer: Medicare Other | Source: Ambulatory Visit | Attending: Cardiology | Admitting: Cardiology

## 2021-10-03 ENCOUNTER — Other Ambulatory Visit: Payer: Self-pay

## 2021-10-05 ENCOUNTER — Encounter (HOSPITAL_COMMUNITY)
Admission: RE | Admit: 2021-10-05 | Discharge: 2021-10-05 | Disposition: A | Discharge status: Home / Self Care | Payer: Medicare Other | Source: Ambulatory Visit | Attending: Cardiology | Admitting: Cardiology

## 2021-10-05 ENCOUNTER — Other Ambulatory Visit: Payer: Self-pay

## 2021-10-05 DIAGNOSIS — M7711 Lateral epicondylitis, right elbow: Secondary | ICD-10-CM | POA: Diagnosis not present

## 2021-10-10 ENCOUNTER — Other Ambulatory Visit: Payer: Self-pay

## 2021-10-10 ENCOUNTER — Encounter (HOSPITAL_COMMUNITY)
Admission: RE | Admit: 2021-10-10 | Discharge: 2021-10-10 | Disposition: A | Discharge status: Home / Self Care | Payer: Medicare Other | Source: Ambulatory Visit | Attending: Cardiology | Admitting: Cardiology

## 2021-10-12 ENCOUNTER — Other Ambulatory Visit: Payer: Self-pay

## 2021-10-12 ENCOUNTER — Encounter (HOSPITAL_COMMUNITY)
Admission: RE | Admit: 2021-10-12 | Discharge: 2021-10-12 | Disposition: A | Discharge status: Home / Self Care | Payer: Medicare Other | Source: Ambulatory Visit | Attending: Cardiology | Admitting: Cardiology

## 2021-10-17 ENCOUNTER — Other Ambulatory Visit: Payer: Self-pay

## 2021-10-17 ENCOUNTER — Encounter (HOSPITAL_COMMUNITY)
Admission: RE | Admit: 2021-10-17 | Discharge: 2021-10-17 | Disposition: A | Discharge status: Home / Self Care | Payer: Medicare Other | Source: Ambulatory Visit | Attending: Cardiology | Admitting: Cardiology

## 2021-10-19 ENCOUNTER — Other Ambulatory Visit: Payer: Self-pay

## 2021-10-19 ENCOUNTER — Encounter (HOSPITAL_COMMUNITY)
Admission: RE | Admit: 2021-10-19 | Discharge: 2021-10-19 | Disposition: A | Discharge status: Home / Self Care | Payer: Medicare Other | Source: Ambulatory Visit | Attending: Cardiology | Admitting: Cardiology

## 2021-10-20 ENCOUNTER — Ambulatory Visit (INDEPENDENT_AMBULATORY_CARE_PROVIDER_SITE_OTHER): Payer: Medicare Other | Admitting: Podiatry

## 2021-10-20 ENCOUNTER — Encounter: Payer: Self-pay | Admitting: Podiatry

## 2021-10-20 DIAGNOSIS — M79676 Pain in unspecified toe(s): Secondary | ICD-10-CM | POA: Diagnosis not present

## 2021-10-20 DIAGNOSIS — D689 Coagulation defect, unspecified: Secondary | ICD-10-CM

## 2021-10-20 DIAGNOSIS — I739 Peripheral vascular disease, unspecified: Secondary | ICD-10-CM

## 2021-10-20 DIAGNOSIS — B351 Tinea unguium: Secondary | ICD-10-CM

## 2021-10-20 NOTE — Progress Notes (Signed)
This patient returns to my office for at risk foot care.  This patient requires this care by a professional since this patient will be at risk due to having renal insufficiency coagulation defect and PAD.   Patient is taking plavix.    This patient is unable to cut nails himself since the patient cannot reach his nails.These nails are painful walking and wearing shoes.  This patient presents for at risk foot care today.  General Appearance  Alert, conversant and in no acute stress.  Vascular  Dorsalis pedis and posterior tibial  pulses are weakly  palpable  bilaterally.  Capillary return is within normal limits  bilaterally. Temperature is within normal limits  bilaterally.  Neurologic  Senn-Weinstein monofilament wire test within normal limits  bilaterally. Muscle power within normal limits bilaterally.  Nails Thick disfigured discolored nails with subungual debris  from hallux to fifth toes bilaterally. No evidence of bacterial infection or drainage bilaterally.  Orthopedic  No limitations of motion  feet .  No crepitus or effusions noted.  No bony pathology or digital deformities noted.  Skin  normotropic skin with no porokeratosis noted bilaterally.  No signs of infections or ulcers noted.     Onychomycosis  Pain in right toes  Pain in left toes  Consent was obtained for treatment procedures.   Mechanical debridement of nails 1-5  bilaterally performed with a nail nipper.  Filed with dremel without incident.    Return office visit   10 weeks                   Told patient to return for periodic foot care and evaluation due to potential at risk complications.   Elmarie Devlin DPM  

## 2021-10-24 ENCOUNTER — Encounter (HOSPITAL_COMMUNITY)
Admission: RE | Admit: 2021-10-24 | Discharge: 2021-10-24 | Disposition: A | Discharge status: Home / Self Care | Payer: Self-pay | Source: Ambulatory Visit | Attending: Cardiology | Admitting: Cardiology

## 2021-10-24 ENCOUNTER — Other Ambulatory Visit: Payer: Self-pay

## 2021-10-24 DIAGNOSIS — I509 Heart failure, unspecified: Secondary | ICD-10-CM | POA: Insufficient documentation

## 2021-10-26 ENCOUNTER — Other Ambulatory Visit: Payer: Self-pay

## 2021-10-26 ENCOUNTER — Encounter (HOSPITAL_COMMUNITY)
Admission: RE | Admit: 2021-10-26 | Discharge: 2021-10-26 | Disposition: A | Discharge status: Home / Self Care | Payer: Self-pay | Source: Ambulatory Visit | Attending: Cardiology | Admitting: Cardiology

## 2021-10-31 ENCOUNTER — Other Ambulatory Visit: Payer: Self-pay

## 2021-10-31 ENCOUNTER — Other Ambulatory Visit (HOSPITAL_COMMUNITY): Payer: Self-pay | Admitting: Cardiology

## 2021-10-31 ENCOUNTER — Encounter (HOSPITAL_COMMUNITY)
Admission: RE | Admit: 2021-10-31 | Discharge: 2021-10-31 | Disposition: A | Discharge status: Home / Self Care | Payer: Self-pay | Source: Ambulatory Visit | Attending: Cardiology | Admitting: Cardiology

## 2021-11-02 ENCOUNTER — Other Ambulatory Visit: Payer: Self-pay

## 2021-11-02 ENCOUNTER — Encounter (HOSPITAL_COMMUNITY)
Admission: RE | Admit: 2021-11-02 | Discharge: 2021-11-02 | Disposition: A | Discharge status: Home / Self Care | Payer: Self-pay | Source: Ambulatory Visit | Attending: Cardiology | Admitting: Cardiology

## 2021-11-07 ENCOUNTER — Encounter (HOSPITAL_COMMUNITY)
Admission: RE | Admit: 2021-11-07 | Discharge: 2021-11-07 | Disposition: A | Discharge status: Home / Self Care | Payer: Self-pay | Source: Ambulatory Visit | Attending: Cardiology | Admitting: Cardiology

## 2021-11-07 ENCOUNTER — Other Ambulatory Visit: Payer: Self-pay

## 2021-11-09 ENCOUNTER — Other Ambulatory Visit: Payer: Self-pay

## 2021-11-09 ENCOUNTER — Encounter (HOSPITAL_COMMUNITY)
Admission: RE | Admit: 2021-11-09 | Discharge: 2021-11-09 | Disposition: A | Discharge status: Home / Self Care | Payer: Self-pay | Source: Ambulatory Visit | Attending: Cardiology | Admitting: Cardiology

## 2021-11-10 DIAGNOSIS — M459 Ankylosing spondylitis of unspecified sites in spine: Secondary | ICD-10-CM | POA: Diagnosis not present

## 2021-11-10 DIAGNOSIS — I5022 Chronic systolic (congestive) heart failure: Secondary | ICD-10-CM | POA: Diagnosis not present

## 2021-11-10 DIAGNOSIS — Z79899 Other long term (current) drug therapy: Secondary | ICD-10-CM | POA: Diagnosis not present

## 2021-11-10 DIAGNOSIS — M8580 Other specified disorders of bone density and structure, unspecified site: Secondary | ICD-10-CM | POA: Diagnosis not present

## 2021-11-14 ENCOUNTER — Encounter (HOSPITAL_COMMUNITY)
Admission: RE | Admit: 2021-11-14 | Discharge: 2021-11-14 | Disposition: A | Discharge status: Home / Self Care | Payer: Self-pay | Source: Ambulatory Visit | Attending: Cardiology | Admitting: Cardiology

## 2021-11-14 ENCOUNTER — Other Ambulatory Visit: Payer: Self-pay

## 2021-11-16 ENCOUNTER — Encounter (HOSPITAL_COMMUNITY)
Admission: RE | Admit: 2021-11-16 | Discharge: 2021-11-16 | Disposition: A | Discharge status: Home / Self Care | Payer: Self-pay | Source: Ambulatory Visit | Attending: Cardiology | Admitting: Cardiology

## 2021-11-16 ENCOUNTER — Other Ambulatory Visit: Payer: Self-pay

## 2021-11-20 ENCOUNTER — Encounter: Payer: Self-pay | Admitting: Internal Medicine

## 2021-11-20 ENCOUNTER — Ambulatory Visit: Payer: Medicare Other | Admitting: Internal Medicine

## 2021-11-20 ENCOUNTER — Other Ambulatory Visit: Payer: Self-pay

## 2021-11-20 VITALS — BP 124/50 | HR 59 | Ht 70.0 in | Wt 166.0 lb

## 2021-11-20 DIAGNOSIS — I2510 Atherosclerotic heart disease of native coronary artery without angina pectoris: Secondary | ICD-10-CM | POA: Diagnosis not present

## 2021-11-20 DIAGNOSIS — E785 Hyperlipidemia, unspecified: Secondary | ICD-10-CM

## 2021-11-20 DIAGNOSIS — I5022 Chronic systolic (congestive) heart failure: Secondary | ICD-10-CM | POA: Diagnosis not present

## 2021-11-20 NOTE — Progress Notes (Signed)
HPI [name] returns today for followup. He is a pleasant 88 yo man with a h/o chronic systolic heart failure and LBBB who is s/p biv PPM insertion. In the interim, he has done well with no chest pain or sob. No syncope. His EF improved from 15% to 50% with biv pacing. He feels well. He is still working part time. No leg swelling.  Allergies  Allergen Reactions   Doxepin Other (See Comments)    hallucinations  hallucinations Hallucinations  hallucinations Hallucinations hallucinations Hallucinations   Sinequan [Doxepin Hcl] Other (See Comments)    hallucinations   Meloxicam Other (See Comments)   Verapamil Other (See Comments)    Caused heart to race Caused heart to race  Caused heart to race Caused heart to race   Procaine Itching and Other (See Comments)    Tingling all over as soon as it was injected  Tingling all over as soon as it was injected Tingling all over as soon as it was injected     Current Outpatient Medications  Medication Sig Dispense Refill   acetaminophen (TYLENOL) 500 MG tablet Take 1,000 mg by mouth daily as needed for mild pain or headache. Reported on 01/25/2016     aspirin EC 81 MG tablet Take 81 mg by mouth daily.     atorvastatin (LIPITOR) 40 MG tablet Take 1 tablet (40 mg total) by mouth daily at 6 PM. 30 tablet 6   Calcium Carbonate-Vitamin D (CALCIUM 600 + D PO) Take 1 tablet by mouth 2 (two) times daily.     carvedilol (COREG) 12.5 MG tablet TAKE 1 TABLET(12.5 MG) BY MOUTH TWICE DAILY 60 tablet 11   clopidogrel (PLAVIX) 75 MG tablet TAKE 1 TABLET(75 MG) BY MOUTH DAILY 90 tablet 3   clotrimazole-betamethasone (LOTRISONE) cream APPLY TO AA BID  1   finasteride (PROSCAR) 5 MG tablet take 1 tablet by mouth once daily     fluticasone (FLONASE) 50 MCG/ACT nasal spray Place 1 spray into both nostrils daily as needed for allergies.      folic acid (FOLVITE) 1 MG tablet Take 1 mg by mouth daily.     furosemide (LASIX) 20 MG tablet TAKE 1  TABLET(20 MG) BY MOUTH EVERY OTHER DAY 15 tablet 6   gabapentin (NEURONTIN) 300 MG capsule Take 300 mg by mouth 2 (two) times daily.  0   meclizine (ANTIVERT) 25 MG tablet Take 1 tablet (25 mg total) by mouth 2 (two) times daily as needed for dizziness. 30 tablet 0   meloxicam (MOBIC) 15 MG tablet Take 15 mg by mouth daily as needed for pain.  0   methotrexate (RHEUMATREX) 2.5 MG tablet Take 2.5 mg by mouth once a week. Caution:Chemotherapy. Protect from light. Takes 7 tablets     mometasone (ELOCON) 0.1 % cream Apply 1 application topically daily as needed (for irritation).      Multiple Vitamin (MULTIVITAMIN PO) Take 1 tablet by mouth daily. ALIVE men's vitamin     nitroGLYCERIN (NITROSTAT) 0.4 MG SL tablet PLACE 1 TABLET UNDER THE TONGUE IF NEEDED EVERY 5 MINUTES FOR CHEST PAIN FOR 3 DOSES IF NO RELIEF AFTER FIRST DOSE CALL PRESCRIBER OR 911 25 tablet 3   ondansetron (ZOFRAN) 4 MG tablet Take 4 mg by mouth 3 (three) times daily as needed.     pantoprazole (PROTONIX) 40 MG tablet Take 40 mg by mouth 2 (two) times daily.   0   polyethylene glycol (MIRALAX / GLYCOLAX) packet Take  17 g by mouth daily as needed for mild constipation.      sacubitril-valsartan (ENTRESTO) 24-26 MG Take 1 tablet by mouth 2 (two) times daily. Reminder due for follow up 12/2021 please call [phone] to schedule 60 tablet 2   spironolactone (ALDACTONE) 25 MG tablet Take 1 tablet (25 mg total) by mouth daily. 90 tablet 3   sulfaSALAzine (AZULFIDINE) 500 MG tablet Take 500 mg by mouth 2 (two) times daily.      No current facility-administered medications for this visit.     Past Medical History:  Diagnosis Date   AAA (abdominal aortic aneurysm)    Abnormal stress test    Allergic rhinitis    uses Flonase daily   Anginal pain (HCC)    pain in neck,shoulders,down arms occ   Arthritis    Cancer (HCC)    skin   Carotid artery occlusion    Chronic venous insufficiency    Coronary artery disease    Dizziness     takes Antivert daily as needed   ED (erectile dysfunction)    GERD (gastroesophageal reflux disease)    takes Omeprazole daily   Hiatal hernia    Hx of echocardiogram 04/04/2010   showed a borderline dilatd left ventricle with mild left ventricle hypertrophy and an EF 50-55% doppler suggested diastolic dysfunction, although the pattern is probably normal for an 88 yrs old. The left atrium was indeed mildly dilated at 42 mm, but there are no significant valvular abnormalities.    Hyperlipidemia    Hypertension    takes Proscar,Dyazide,Avapro,and Metoprolol daily   Myocardial infarction [facility]) Oct. 11, 2016   Heart Attack   Palpitation    Shortness of breath    Sigmoid diverticulosis    T12 compression fracture (HCC)    Thyroid disease     ROS:   All systems reviewed and negative except as noted in the HPI.   Past Surgical History:  Procedure Laterality Date   ABDOMINAL AORTIC ANEURYSM REPAIR  02/24/2009   AAA Stenting   CARDIAC CATHETERIZATION N/A 08/04/2015   Procedure: Right/Left Heart Cath and Coronary Angiography;  Surgeon: Larey Dresser, MD;  Location: [place] [facility];  Service: Cardiovascular;  Laterality: N/A;   CARDIAC CATHETERIZATION N/A 08/04/2015   Procedure: Coronary Stent Intervention;  Surgeon: Burnell Blanks, MD;  Location: [place] [facility];  Service: Cardiovascular;  Laterality: N/A;   CARDIAC CATHETERIZATION N/A 08/08/2015   Procedure: Coronary Stent Intervention;  Surgeon: Peter M Martinique, MD;  Location: [place] [facility];  Service: Cardiovascular;  Laterality: N/A;   ENDARTERECTOMY Right 10/27/2013   Procedure: ENDARTERECTOMY CAROTID;  Surgeon: Elam Dutch, MD;  Location: [place];  Service: Vascular;  Laterality: Right;   EP IMPLANTABLE DEVICE N/A 01/26/2016   Procedure: BiV Pacemaker Insertion CRT-P;  Surgeon: Evans Lance, MD;  Location: [place] [facility];  Service: Cardiovascular;  Laterality: N/A;   HAND SURGERY Right    tendon, lft 90's    HERNIA REPAIR Bilateral    LEFT HEART CATHETERIZATION WITH CORONARY ANGIOGRAM N/A 10/12/2013   Procedure: LEFT HEART CATHETERIZATION WITH CORONARY ANGIOGRAM;  Surgeon: Lorretta Harp, MD;  Location: [facility] CATH LAB;  Service: Cardiovascular;  Laterality: N/A;   melanoma removed     ROTATOR CUFF REPAIR  2003   left arm     Family History  Problem Relation Age of Onset   Heart disease Mother        before age 68   COPD Mother  Diabetes Mother    Hyperlipidemia Mother    Hypertension Mother      Social History   Socioeconomic History   Marital status: Widowed    Spouse name: Not on file   Number of children: Not on file   Years of education: Not on file   Highest education level: Not on file  Occupational History   Not on file  Tobacco Use   Smoking status: Former    Packs/day: 2.00    Years: 30.00    Pack years: 60.00    Types: Cigarettes    Quit date: 10/22/1977    Years since quitting: 44.1   Smokeless tobacco: Former    Quit date: 10/22/1984  Substance and Sexual Activity   Alcohol use: Yes    Alcohol/week: 7.0 standard drinks    Types: 7 Cans of beer per week   Drug use: No   Sexual activity: Not on file  Other Topics Concern   Not on file  Social History Narrative   Not on file   Social Determinants of Health   Financial Resource Strain: Not on file  Food Insecurity: Not on file  Transportation Needs: Not on file  Physical Activity: Not on file  Stress: Not on file  Social Connections: Not on file  Intimate Partner Violence: Not on file     BP (!) 124/50    Pulse (!) 59    Ht 5\' 10"  (1.778 m)    Wt 166 lb (75.3 kg)    SpO2 96%    BMI 23.82 kg/m   Physical Exam:  Well appearing elderly man, NAD HEENT: Unremarkable Neck:  6 cm JVD, no thyromegally Lymphatics:  No adenopathy Back:  No CVA tenderness Lungs:  Clear with no wheezes HEART:  Regular rate rhythm, no murmurs, no rubs, no clicks Abd:  soft, positive bowel sounds, no organomegally, no  rebound, no guarding Ext:  2 plus pulses, no edema, no cyanosis, no clubbing Skin:  No rashes no nodules Neuro:  CN II through XII intact, motor grossly intact  EKG - NSR with Biv pacing  DEVICE  Normal device function.  See PaceArt for details.   Assess/Plan:   Chronic systolic heart failure - he is doing well with class 2 symptoms and his ef has almost normalized. He will continue guideline directed medical therapy. 2. PPM - his St. Jude Biv PPM is working normally. Thresholds and sensing are excellent. 3. CAD - he is s/p NSTEMI and denies any anginal symptoms. 4. Dyslipidemia - he will continue atorvastatin. LIpids are well controlled.   Carleene Overlie Yordy Matton,MD

## 2021-11-20 NOTE — Patient Instructions (Addendum)
Medication Instructions:  Your physician recommends that you continue on your current medications as directed. Please refer to the Current Medication list given to you today. *If you need a refill on your cardiac medications before your next appointment, please call your pharmacy*  Lab Work: None. If you have labs (blood work) drawn today and your tests are completely normal, you will receive your results only by: [place] (if you have MyChart) OR A paper copy in the mail If you have any lab test that is abnormal or we need to change your treatment, we will call you to review the results.  Testing/Procedures: None.  Follow-Up: At [facility], you and your health needs are our priority.  As part of our continuing mission to provide you with exceptional heart care, we have created designated Provider Care Teams.  These Care Teams include your primary Cardiologist (physician) and Advanced Practice Providers (APPs -  Physician Assistants and Nurse Practitioners) who all work together to provide you with the care you need, when you need it.  Your physician wants you to follow-up in: 12 months with Cristopher Peru, MD     You will receive a reminder letter in the mail two months in advance. If you don't receive a letter, please call our office to schedule the follow-up appointment.  Remote monitoring is used to monitor your Pacemaker from home. This monitoring reduces the number of office visits required to check your device to one time per year. It allows [place] to keep an eye on the functioning of your device to ensure it is working properly. You are scheduled for a device check from home on 12/08/21. You may send your transmission at any time that day. If you have a wireless device, the transmission will be sent automatically. After your physician reviews your transmission, you will receive a postcard with your next transmission date.  We recommend signing up for the patient portal called  "MyChart".  Sign up information is provided on this After Visit Summary.  MyChart is used to connect with patients for Virtual Visits (Telemedicine).  Patients are able to view lab/test results, encounter notes, upcoming appointments, etc.  Non-urgent messages can be sent to your provider as well.   To learn more about what you can do with MyChart, go to [email or web address].    Any Other Special Instructions Will Be Listed Below (If Applicable).

## 2021-11-21 ENCOUNTER — Encounter (HOSPITAL_COMMUNITY)
Admission: RE | Admit: 2021-11-21 | Discharge: 2021-11-21 | Disposition: A | Discharge status: Home / Self Care | Payer: Self-pay | Source: Ambulatory Visit | Attending: Cardiology | Admitting: Cardiology

## 2021-11-23 ENCOUNTER — Encounter (HOSPITAL_COMMUNITY): Payer: Self-pay

## 2021-11-28 ENCOUNTER — Encounter (HOSPITAL_COMMUNITY): Payer: Self-pay

## 2021-11-30 ENCOUNTER — Encounter (HOSPITAL_COMMUNITY): Payer: Self-pay

## 2021-12-05 ENCOUNTER — Encounter (HOSPITAL_COMMUNITY): Payer: Self-pay

## 2021-12-07 ENCOUNTER — Encounter (HOSPITAL_COMMUNITY): Payer: Self-pay

## 2021-12-08 ENCOUNTER — Ambulatory Visit (INDEPENDENT_AMBULATORY_CARE_PROVIDER_SITE_OTHER): Payer: Medicare Other

## 2021-12-08 DIAGNOSIS — I441 Atrioventricular block, second degree: Secondary | ICD-10-CM

## 2021-12-08 LAB — CUP PACEART REMOTE DEVICE CHECK
Battery Remaining Longevity: 46 mo
Battery Remaining Percentage: 42 %
Battery Voltage: 2.95 V
Brady Statistic AP VP Percent: 2 %
Brady Statistic AP VS Percent: 1 %
Brady Statistic AS VP Percent: 98 %
Brady Statistic AS VS Percent: 1 %
Brady Statistic RA Percent Paced: 1.8 %
Date Time Interrogation Session: 20230217045751
Implantable Lead Implant Date: 20170406
Implantable Lead Implant Date: 20170406
Implantable Lead Implant Date: 20170406
Implantable Lead Location: 753858
Implantable Lead Location: 753859
Implantable Lead Location: 753860
Implantable Pulse Generator Implant Date: 20170406
Lead Channel Impedance Value: 440 Ohm
Lead Channel Impedance Value: 480 Ohm
Lead Channel Impedance Value: 790 Ohm
Lead Channel Pacing Threshold Amplitude: 0.5 V
Lead Channel Pacing Threshold Amplitude: 0.875 V
Lead Channel Pacing Threshold Amplitude: 1.125 V
Lead Channel Pacing Threshold Pulse Width: 0.4 ms
Lead Channel Pacing Threshold Pulse Width: 0.4 ms
Lead Channel Pacing Threshold Pulse Width: 0.4 ms
Lead Channel Sensing Intrinsic Amplitude: 12 mV
Lead Channel Sensing Intrinsic Amplitude: 2.1 mV
Lead Channel Setting Pacing Amplitude: 2 V
Lead Channel Setting Pacing Amplitude: 2 V
Lead Channel Setting Pacing Amplitude: 2.125
Lead Channel Setting Pacing Pulse Width: 0.4 ms
Lead Channel Setting Pacing Pulse Width: 0.4 ms
Lead Channel Setting Sensing Sensitivity: 2 mV
Pulse Gen Model: 3262
Pulse Gen Serial Number: 7866838

## 2021-12-12 ENCOUNTER — Encounter (HOSPITAL_COMMUNITY): Payer: Self-pay

## 2021-12-13 NOTE — Progress Notes (Signed)
Remote pacemaker transmission.   

## 2021-12-14 ENCOUNTER — Encounter (HOSPITAL_COMMUNITY): Payer: Self-pay

## 2021-12-15 DIAGNOSIS — I700 Atherosclerosis of aorta: Secondary | ICD-10-CM | POA: Diagnosis not present

## 2021-12-15 DIAGNOSIS — I5022 Chronic systolic (congestive) heart failure: Secondary | ICD-10-CM | POA: Diagnosis not present

## 2021-12-15 DIAGNOSIS — I739 Peripheral vascular disease, unspecified: Secondary | ICD-10-CM | POA: Diagnosis not present

## 2021-12-15 DIAGNOSIS — I10 Essential (primary) hypertension: Secondary | ICD-10-CM | POA: Diagnosis not present

## 2021-12-19 ENCOUNTER — Encounter (HOSPITAL_COMMUNITY): Payer: Self-pay

## 2021-12-19 DIAGNOSIS — H04563 Stenosis of bilateral lacrimal punctum: Secondary | ICD-10-CM | POA: Diagnosis not present

## 2021-12-19 DIAGNOSIS — H02055 Trichiasis without entropian left lower eyelid: Secondary | ICD-10-CM | POA: Diagnosis not present

## 2021-12-19 DIAGNOSIS — Z961 Presence of intraocular lens: Secondary | ICD-10-CM | POA: Diagnosis not present

## 2021-12-19 DIAGNOSIS — H02052 Trichiasis without entropian right lower eyelid: Secondary | ICD-10-CM | POA: Diagnosis not present

## 2021-12-22 DIAGNOSIS — I700 Atherosclerosis of aorta: Secondary | ICD-10-CM | POA: Diagnosis not present

## 2021-12-22 DIAGNOSIS — I739 Peripheral vascular disease, unspecified: Secondary | ICD-10-CM | POA: Diagnosis not present

## 2021-12-22 DIAGNOSIS — Z0000 Encounter for general adult medical examination without abnormal findings: Secondary | ICD-10-CM | POA: Diagnosis not present

## 2021-12-22 DIAGNOSIS — D692 Other nonthrombocytopenic purpura: Secondary | ICD-10-CM | POA: Diagnosis not present

## 2021-12-28 ENCOUNTER — Other Ambulatory Visit: Payer: Self-pay

## 2021-12-28 MED ORDER — NITROGLYCERIN 0.4 MG SL SUBL: SUBLINGUAL_TABLET | SUBLINGUAL | 3 refills | Status: DC

## 2021-12-29 ENCOUNTER — Other Ambulatory Visit: Payer: Self-pay

## 2021-12-29 ENCOUNTER — Encounter: Payer: Self-pay | Admitting: Podiatry

## 2021-12-29 ENCOUNTER — Ambulatory Visit (INDEPENDENT_AMBULATORY_CARE_PROVIDER_SITE_OTHER): Payer: Medicare Other | Admitting: Podiatry

## 2021-12-29 DIAGNOSIS — D689 Coagulation defect, unspecified: Secondary | ICD-10-CM

## 2021-12-29 DIAGNOSIS — B351 Tinea unguium: Secondary | ICD-10-CM

## 2021-12-29 DIAGNOSIS — M79676 Pain in unspecified toe(s): Secondary | ICD-10-CM | POA: Diagnosis not present

## 2021-12-29 DIAGNOSIS — I739 Peripheral vascular disease, unspecified: Secondary | ICD-10-CM | POA: Diagnosis not present

## 2021-12-29 NOTE — Progress Notes (Signed)
This patient returns to my office for at risk foot care.  This patient requires this care by a professional since this patient will be at risk due to having renal insufficiency coagulation defect and PAD.   Patient is taking plavix.    This patient is unable to cut nails himself since the patient cannot reach his nails.These nails are painful walking and wearing shoes.  This patient presents for at risk foot care today.  General Appearance  Alert, conversant and in no acute stress.  Vascular  Dorsalis pedis and posterior tibial  pulses are weakly  palpable  bilaterally.  Capillary return is within normal limits  bilaterally. Temperature is within normal limits  bilaterally.  Neurologic  Senn-Weinstein monofilament wire test within normal limits  bilaterally. Muscle power within normal limits bilaterally.  Nails Thick disfigured discolored nails with subungual debris  from hallux to fifth toes bilaterally. No evidence of bacterial infection or drainage bilaterally.  Orthopedic  No limitations of motion  feet .  No crepitus or effusions noted.  No bony pathology or digital deformities noted.  Skin  normotropic skin with no porokeratosis noted bilaterally.  No signs of infections or ulcers noted.     Onychomycosis  Pain in right toes  Pain in left toes  Consent was obtained for treatment procedures.   Mechanical debridement of nails 1-5  bilaterally performed with a nail nipper.  Filed with dremel without incident.    Return office visit   10 weeks                   Told patient to return for periodic foot care and evaluation due to potential at risk complications.   Quintin Hjort DPM  

## 2022-01-05 ENCOUNTER — Encounter (HOSPITAL_COMMUNITY): Payer: Self-pay | Admitting: Cardiology

## 2022-01-05 ENCOUNTER — Ambulatory Visit (HOSPITAL_COMMUNITY)
Admission: RE | Admit: 2022-01-05 | Discharge: 2022-01-05 | Disposition: A | Discharge status: Home / Self Care | Payer: Medicare Other | Source: Ambulatory Visit | Attending: Cardiology | Admitting: Cardiology

## 2022-01-05 ENCOUNTER — Other Ambulatory Visit: Payer: Self-pay

## 2022-01-05 VITALS — BP 102/50 | HR 67 | Wt 167.6 lb

## 2022-01-05 DIAGNOSIS — Z7902 Long term (current) use of antithrombotics/antiplatelets: Secondary | ICD-10-CM | POA: Diagnosis not present

## 2022-01-05 DIAGNOSIS — I255 Ischemic cardiomyopathy: Secondary | ICD-10-CM | POA: Insufficient documentation

## 2022-01-05 DIAGNOSIS — Z955 Presence of coronary angioplasty implant and graft: Secondary | ICD-10-CM | POA: Insufficient documentation

## 2022-01-05 DIAGNOSIS — I714 Abdominal aortic aneurysm, without rupture, unspecified: Secondary | ICD-10-CM | POA: Insufficient documentation

## 2022-01-05 DIAGNOSIS — I5022 Chronic systolic (congestive) heart failure: Secondary | ICD-10-CM | POA: Diagnosis not present

## 2022-01-05 DIAGNOSIS — I447 Left bundle-branch block, unspecified: Secondary | ICD-10-CM | POA: Diagnosis not present

## 2022-01-05 DIAGNOSIS — I5042 Chronic combined systolic (congestive) and diastolic (congestive) heart failure: Secondary | ICD-10-CM | POA: Insufficient documentation

## 2022-01-05 DIAGNOSIS — Z950 Presence of cardiac pacemaker: Secondary | ICD-10-CM | POA: Insufficient documentation

## 2022-01-05 DIAGNOSIS — I2582 Chronic total occlusion of coronary artery: Secondary | ICD-10-CM | POA: Diagnosis not present

## 2022-01-05 DIAGNOSIS — I2510 Atherosclerotic heart disease of native coronary artery without angina pectoris: Secondary | ICD-10-CM | POA: Insufficient documentation

## 2022-01-05 DIAGNOSIS — Z79899 Other long term (current) drug therapy: Secondary | ICD-10-CM | POA: Diagnosis not present

## 2022-01-05 DIAGNOSIS — I252 Old myocardial infarction: Secondary | ICD-10-CM | POA: Diagnosis not present

## 2022-01-05 LAB — CBC
HCT: 34.4 % — ABNORMAL LOW (ref 39.0–52.0)
Hemoglobin: 11.7 g/dL — ABNORMAL LOW (ref 13.0–17.0)
MCH: 32.3 pg (ref 26.0–34.0)
MCHC: 34 g/dL (ref 30.0–36.0)
MCV: 95 fL (ref 80.0–100.0)
Platelets: 145 10*3/uL — ABNORMAL LOW (ref 150–400)
RBC: 3.62 MIL/uL — ABNORMAL LOW (ref 4.22–5.81)
RDW: 14.7 % (ref 11.5–15.5)
WBC: 5.1 10*3/uL (ref 4.0–10.5)
nRBC: 0 % (ref 0.0–0.2)

## 2022-01-05 LAB — BRAIN NATRIURETIC PEPTIDE: B Natriuretic Peptide: 70.3 pg/mL (ref 0.0–100.0)

## 2022-01-05 LAB — BASIC METABOLIC PANEL
Anion gap: 9 (ref 5–15)
BUN: 17 mg/dL (ref 8–23)
CO2: 24 mmol/L (ref 22–32)
Calcium: 8.8 mg/dL — ABNORMAL LOW (ref 8.9–10.3)
Chloride: 100 mmol/L (ref 98–111)
Creatinine, Ser: 0.89 mg/dL (ref 0.61–1.24)
GFR, Estimated: >60 mL/min (ref >60)
Glucose, Bld: 103 mg/dL — ABNORMAL HIGH (ref 70–99)
Potassium: 3.8 mmol/L (ref 3.5–5.1)
Sodium: 133 mmol/L — ABNORMAL LOW (ref 135–145)

## 2022-01-05 NOTE — Patient Instructions (Signed)
Medication Changes: ? ?Stop asprin  ? ?Lab Work: ? ?Labs done today, your results will be available in MyChart, we will contact you for abnormal readings. ? ? ?Testing/Procedures: ? ?Repeat blood work in 3 months  ? ?Referrals: ? ?Cardiac rehab. They will call you to make your appointment ? ?Special Instructions // Education: ? ?none ? ?Follow-Up in: 6 months (September 2023) ** please call the office in July to arrange your follow up appointment** ? ?At the [facility], you and your health needs are our priority. We have a designated team specialized in the treatment of Heart Failure. This Care Team includes your primary Heart Failure Specialized Cardiologist (physician), Advanced Practice Providers (APPs- Physician Assistants and Nurse Practitioners), and Pharmacist who all work together to provide you with the care you need, when you need it.  ? ?You may see any of the following providers on your designated Care Team at your next follow up: ? ?Dr Glori Bickers ?Dr Loralie Champagne ?Darrick Grinder, NP ?Lyda Jester, PA ?Jessica [place],NP ?Marlyce Huge, PA ?Audry Riles, PharmD ? ? ?Please be sure to bring in all your medications bottles to every appointment.  ? ?Need to Contact us: ? ?If you have any questions or concerns before your next appointment please send [place] a message through [place] or call our office at [phone].   ? ?TO LEAVE A MESSAGE FOR THE NURSE SELECT OPTION 2, PLEASE LEAVE A MESSAGE INCLUDING: ?YOUR NAME ?DATE OF BIRTH ?CALL BACK NUMBER ?REASON FOR CALL**this is important as we prioritize the call backs ? ?YOU WILL RECEIVE A CALL BACK THE SAME DAY AS LONG AS YOU CALL BEFORE 4:00 PM ? ? ?

## 2022-01-07 NOTE — Progress Notes (Signed)
Patient ID: AQIB LOUGH, male   DOB: 07-17-1927, 88 y.o.   MRN: [id number]  ? ? ?  ?Advanced Heart Failure Clinic Note  ?PCP: Dr. Ashby Dawes ?Cardiology: Dr. Aundra Dubin ? ?ADRION MENZ is a 88 y.o. male with history of CAD, chronic systolic CHF, LBBB, AAA s/p repair presents for followup of CHF and CAD.  He had a cardiac cath in 12/15, showing total occlusion of the LAD and 50-60% proximal RCA.  EF at that time was near-normal.  In 10/16, he developed dyspnea and epigastric pain.  He was noted to be in acute pulmonary edema and was diuresed.  Troponin was only mildly elevated at 0.47.  He ultimately underwent LHC showing hazy 80-90% RCA stenosis which was likely the culprit for his presentation.  Additionally, the LAD was only subtotally occluded on this cath.  He had DES to RCA.  Cardiac MRI was done, showing EF 22% with significant viability in LAD territory.  Therefore, CTO of LAD was opened with DES.  Echo in 2/17 showed EF persistently low at 15%.  He had St Jude CRT-P placement in 4/17. Echo in 1/19 showed EF up to 45-50% with mild MR.   ? ?Echo in 3/20 showed EF 45% with mild diffuse hypokinesis, mildly decreased RV systolic function. Echo in 3/21 showed EF 45-50%, mild RV dilation with normal systolic function. Echo in 10/22 showed EF 50-55%, normal RV, mild MR.   ? ?He was diagnosed with ankylosing spondylitis and is now on methotrexate.   ? ?Chronic low back pain continues to be his main complaint.  Using cane for stability but denies exertional dyspnea or chest pain.  No lightheadedness. No orthopnea/PND.  He is still working as a Furniture conservator/restorer.   ? ?St Jude device interrogation: >99% BiV pacing, no AF, thoracic impedence ok today.   ? ?Labs (10/16): hgb 9.7, K 4.6, creatinine 1.12 ?Labs (11/16): K 5.9, creatinine 1.5 ?Labs (12/16); K 4.2, creatinine 1.07 => 1.02, BNP 448 ?Labs (1/17): K 4.1, creatinine 1.13, BNP 423, LDL 40, HDL 47 ?Labs (4/17): K 3.9, creatinine 1.03, HCT 32.6 ?Labs (5/17): K 4.4,  creatinine 1.09 ?Labs (1/18): LDL 37 ?Labs (01/25/2017): K 3.5 Creatinine 0.96  ?Labs (8/18): BNP 30, K 3.7, creatinine 1.0 ?Labs (11/18): LDL 40, HDL 47, K 3.5, creatinine 0.96  ?Labs (3/19): K 4, creatinine 0.97 ?Labs (11/19): K 3.9, creatinine 1.2, LDL 44 ?Labs (4/20): K 4.1, creatinine 1.11 ?Labs (10/20): LDL 46 ?Labs (11/20): K 4.4, creatinine 1.05 ?Labs (3/21): K 4.2, creatinine 0.92 ?Labs (7/21): LDL 58 ?Labs (8/21): K 4, creatinine 1.27 ?Labs (11/21): K 3.9, creatinine 0.88 ?Labs (9/22): K 3.6, creatinine 0.82, LDL 44 ? ?PMH: ?1. CKD ?2. AAA s/p repair, followed at [facility]. ?3. Carotid stenosis: s/p right CEA.   Carotid dopplers (2/16) with right CEA stable, < 60% LICA stenosis.  Followed at [facility].  ?- Carotid dopplers (1/09): 3-23% LICA, patent right CEA.  ?4. Chronic LBBB ?5. LAD: LHC (12/15) with totally occluded LAD, 50-60% pRCA => medically managed.  10/16 admitted with NSTEMI.  LHC with subtotalled LAD occlusion, 80-90% pRCA with thrombus => DES RCA initially.  Cardiac MRI was done showing viability in the LAD territory, so he had DES to the CTO LAD.   ?6. Chronic systolic CHF: Ischemic cardiomyopathy.  Echo (10/16) with EF 15% with wall motion abnormalities, moderately dilated RV with normal systolic function.  CMRI (10/16) with severe LV dilation, EF 22% with septal-lateral dyssynchrony, no LV thrombus, normal RV size/systolic function =>  significant viability noted in the LAD territory.  Echo (2/17): EF 15%, severe LV dilation, wall motion abnormalities noted, mild MR, mildly dilated RV with normal systolic function, PASP 55 mmHg.  St Jude CRT-P 4/17.  ?- Echo (1/18): EF 50-55%, mild to moderate MR.  ?- Echo (1/19): EF 45-50%, mild LVH, inferior hypokinesis, mild MR, PASP 30 mmHg.  ?- Echo (3/20): EF 45%, diffuse hypokinesis, normal RV size with mildly decreased systolic function.  ?- Echo (3/21): EF 45-50%, mildly dilated RV with normal systolic function.  ?- Echo (10/22): EF 50-55%, normal RV, mild MR ?7.  ABIs (10/16) were normal.  ?8. Raynaud's syndrome.  ?9. Sciatica ? ?SH: Lives alone, prior smoker, still working as a Furniture conservator/restorer. ? ?FH: CAD ? ?Review of systems complete and found to be negative unless listed in HPI.   ? ?Current Outpatient Medications  ?Medication Sig Dispense Refill  ? acetaminophen (TYLENOL) 500 MG tablet Take 1,000 mg by mouth daily as needed for mild pain or headache. Reported on 01/25/2016    ? atorvastatin (LIPITOR) 40 MG tablet Take 1 tablet (40 mg total) by mouth daily at 6 PM. 30 tablet 6  ? Calcium Carbonate-Vitamin D (CALCIUM 600 + D PO) Take 1 tablet by mouth 2 (two) times daily.    ? carvedilol (COREG) 12.5 MG tablet TAKE 1 TABLET(12.5 MG) BY MOUTH TWICE DAILY 60 tablet 11  ? clopidogrel (PLAVIX) 75 MG tablet TAKE 1 TABLET(75 MG) BY MOUTH DAILY 90 tablet 3  ? clotrimazole-betamethasone (LOTRISONE) cream APPLY TO AA BID  1  ? finasteride (PROSCAR) 5 MG tablet take 1 tablet by mouth once daily    ? fluticasone (FLONASE) 50 MCG/ACT nasal spray Place 1 spray into both nostrils daily as needed for allergies.     ? folic acid (FOLVITE) 1 MG tablet Take 1 mg by mouth daily.    ? furosemide (LASIX) 20 MG tablet TAKE 1 TABLET(20 MG) BY MOUTH EVERY OTHER DAY 15 tablet 6  ? gabapentin (NEURONTIN) 300 MG capsule Take 300 mg by mouth 2 (two) times daily.  0  ? meclizine (ANTIVERT) 25 MG tablet Take 1 tablet (25 mg total) by mouth 2 (two) times daily as needed for dizziness. 30 tablet 0  ? meloxicam (MOBIC) 15 MG tablet Take 15 mg by mouth daily as needed for pain.  0  ? methotrexate (RHEUMATREX) 2.5 MG tablet Take 2.5 mg by mouth once a week. Caution:Chemotherapy. Protect from light. Takes 7 tablets    ? mometasone (ELOCON) 0.1 % cream Apply 1 application topically daily as needed (for irritation).     ? Multiple Vitamin (MULTIVITAMIN PO) Take 1 tablet by mouth daily. ALIVE men's vitamin    ? nitroGLYCERIN (NITROSTAT) 0.4 MG SL tablet PLACE 1 TABLET UNDER THE TONGUE IF NEEDED EVERY 5 MINUTES FOR  CHEST PAIN FOR 3 DOSES IF NO RELIEF AFTER FIRST DOSE CALL PRESCRIBER OR 911 25 tablet 3  ? pantoprazole (PROTONIX) 40 MG tablet Take 40 mg by mouth daily.    ? sacubitril-valsartan (ENTRESTO) 24-26 MG Take 1 tablet by mouth 2 (two) times daily. Reminder due for follow up 12/2021 please call [phone] to schedule 60 tablet 2  ? spironolactone (ALDACTONE) 25 MG tablet Take 1 tablet (25 mg total) by mouth daily. 90 tablet 3  ? sulfaSALAzine (AZULFIDINE) 500 MG tablet Take 1,500 mg by mouth 2 (two) times daily.    ? ?No current facility-administered medications for this encounter.  ? ?BP (!) 102/50   Pulse 67  Wt 76 kg (167 lb 9.6 oz)   SpO2 96%   BMI 24.05 kg/m?  ? ?Wt Readings from Last 3 Encounters:  ?01/05/22 76 kg (167 lb 9.6 oz)  ?11/20/21 75.3 kg (166 lb)  ?07/05/21 75.7 kg (166 lb 12.8 oz)  ?  ?General: NAD, kyphotic ?Neck: No JVD, no thyromegaly or thyroid nodule.  ?Lungs: Clear to auscultation bilaterally with normal respiratory effort. ?CV: Nondisplaced PMI.  Heart regular S1/S2, no S3/S4, no murmur.  Trace ankle edema.  No carotid bruit.  Normal pedal pulses.  ?Abdomen: Soft, nontender, no hepatosplenomegaly, no distention.  ?Skin: Intact without lesions or rashes.  ?Neurologic: Alert and oriented x 3.  ?Psych: Normal affect. ?Extremities: No clubbing or cyanosis.  ?HEENT: Normal.  ? ?Assessment/Plan: ?1. CAD: NSTEMI with DES to RCA (culprit vessel) and subsequent DES to CTO of LAD (cMRI showed viability in the LAD distribution) in 10/16. No chest pain.  ?- Continue Plavix but think he can stop ASA (do not think he needs DAPT long-term).  CBC today.  ?- atorvastatin 40 daily, good lipids in 9/22. ?2. Chronic systolic/diastolic heart failure: Ischemic cardiomyopathy. Echo in 3/21 showed EF 45-50%, improved since getting CRT.  Echo in 10/22 showed EF 50-55%.  St Jude CRT-D.  NYHA class II.  Volume status looks ok on exam and by [place].   ?- Continue Lasix 20 mg qod. BMET today.  ?- Continue Coreg 12.5  mg bid  ?- Continue spironolactone 25 mg daily.   ?- Continue Entresto 24/26 mg BID.  ?3. Carotid stenosis: Minimal carotid stenosis on 9/22 dopplers.   ? ?BMET in 3 months, followup in 6 months.  ? ?Loralie Champagne, MD

## 2022-01-09 ENCOUNTER — Encounter (HOSPITAL_COMMUNITY): Payer: Self-pay

## 2022-01-09 ENCOUNTER — Telehealth (HOSPITAL_COMMUNITY): Payer: Self-pay

## 2022-01-09 NOTE — Telephone Encounter (Signed)
Pt returned phone call and stated that he is interested in the maintenance cardiac rehab program. Will advised [facility] maintenance coordinator about pt. ?Closed referral. ?

## 2022-01-15 ENCOUNTER — Other Ambulatory Visit: Payer: Self-pay | Admitting: Cardiology

## 2022-01-15 DIAGNOSIS — R2681 Unsteadiness on feet: Secondary | ICD-10-CM | POA: Diagnosis not present

## 2022-01-15 DIAGNOSIS — R531 Weakness: Secondary | ICD-10-CM | POA: Diagnosis not present

## 2022-01-24 DIAGNOSIS — R531 Weakness: Secondary | ICD-10-CM | POA: Diagnosis not present

## 2022-01-24 DIAGNOSIS — R2681 Unsteadiness on feet: Secondary | ICD-10-CM | POA: Diagnosis not present

## 2022-01-26 DIAGNOSIS — R531 Weakness: Secondary | ICD-10-CM | POA: Diagnosis not present

## 2022-01-26 DIAGNOSIS — R2681 Unsteadiness on feet: Secondary | ICD-10-CM | POA: Diagnosis not present

## 2022-01-29 DIAGNOSIS — R2681 Unsteadiness on feet: Secondary | ICD-10-CM | POA: Diagnosis not present

## 2022-01-29 DIAGNOSIS — R531 Weakness: Secondary | ICD-10-CM | POA: Diagnosis not present

## 2022-01-30 ENCOUNTER — Other Ambulatory Visit (HOSPITAL_COMMUNITY): Payer: Self-pay | Admitting: Cardiology

## 2022-02-02 DIAGNOSIS — R2681 Unsteadiness on feet: Secondary | ICD-10-CM | POA: Diagnosis not present

## 2022-02-02 DIAGNOSIS — R531 Weakness: Secondary | ICD-10-CM | POA: Diagnosis not present

## 2022-02-05 DIAGNOSIS — R2681 Unsteadiness on feet: Secondary | ICD-10-CM | POA: Diagnosis not present

## 2022-02-05 DIAGNOSIS — R531 Weakness: Secondary | ICD-10-CM | POA: Diagnosis not present

## 2022-02-07 DIAGNOSIS — R531 Weakness: Secondary | ICD-10-CM | POA: Diagnosis not present

## 2022-02-07 DIAGNOSIS — R2681 Unsteadiness on feet: Secondary | ICD-10-CM | POA: Diagnosis not present

## 2022-02-12 DIAGNOSIS — R2681 Unsteadiness on feet: Secondary | ICD-10-CM | POA: Diagnosis not present

## 2022-02-12 DIAGNOSIS — R531 Weakness: Secondary | ICD-10-CM | POA: Diagnosis not present

## 2022-02-16 DIAGNOSIS — M459 Ankylosing spondylitis of unspecified sites in spine: Secondary | ICD-10-CM | POA: Diagnosis not present

## 2022-02-16 DIAGNOSIS — M8580 Other specified disorders of bone density and structure, unspecified site: Secondary | ICD-10-CM | POA: Diagnosis not present

## 2022-02-16 DIAGNOSIS — Z79899 Other long term (current) drug therapy: Secondary | ICD-10-CM | POA: Diagnosis not present

## 2022-02-16 DIAGNOSIS — R531 Weakness: Secondary | ICD-10-CM | POA: Diagnosis not present

## 2022-02-16 DIAGNOSIS — I5022 Chronic systolic (congestive) heart failure: Secondary | ICD-10-CM | POA: Diagnosis not present

## 2022-02-16 DIAGNOSIS — R2681 Unsteadiness on feet: Secondary | ICD-10-CM | POA: Diagnosis not present

## 2022-02-19 DIAGNOSIS — R531 Weakness: Secondary | ICD-10-CM | POA: Diagnosis not present

## 2022-02-19 DIAGNOSIS — R2681 Unsteadiness on feet: Secondary | ICD-10-CM | POA: Diagnosis not present

## 2022-02-23 DIAGNOSIS — M459 Ankylosing spondylitis of unspecified sites in spine: Secondary | ICD-10-CM | POA: Diagnosis not present

## 2022-02-23 DIAGNOSIS — R531 Weakness: Secondary | ICD-10-CM | POA: Diagnosis not present

## 2022-02-23 DIAGNOSIS — M8580 Other specified disorders of bone density and structure, unspecified site: Secondary | ICD-10-CM | POA: Diagnosis not present

## 2022-02-23 DIAGNOSIS — I5022 Chronic systolic (congestive) heart failure: Secondary | ICD-10-CM | POA: Diagnosis not present

## 2022-02-23 DIAGNOSIS — M25561 Pain in right knee: Secondary | ICD-10-CM | POA: Diagnosis not present

## 2022-02-23 DIAGNOSIS — R2681 Unsteadiness on feet: Secondary | ICD-10-CM | POA: Diagnosis not present

## 2022-02-26 DIAGNOSIS — R2681 Unsteadiness on feet: Secondary | ICD-10-CM | POA: Diagnosis not present

## 2022-02-26 DIAGNOSIS — R531 Weakness: Secondary | ICD-10-CM | POA: Diagnosis not present

## 2022-03-02 DIAGNOSIS — R531 Weakness: Secondary | ICD-10-CM | POA: Diagnosis not present

## 2022-03-02 DIAGNOSIS — R2681 Unsteadiness on feet: Secondary | ICD-10-CM | POA: Diagnosis not present

## 2022-03-09 ENCOUNTER — Ambulatory Visit (INDEPENDENT_AMBULATORY_CARE_PROVIDER_SITE_OTHER): Payer: Medicare Other

## 2022-03-09 DIAGNOSIS — I441 Atrioventricular block, second degree: Secondary | ICD-10-CM

## 2022-03-09 DIAGNOSIS — R2681 Unsteadiness on feet: Secondary | ICD-10-CM | POA: Diagnosis not present

## 2022-03-09 DIAGNOSIS — R531 Weakness: Secondary | ICD-10-CM | POA: Diagnosis not present

## 2022-03-09 LAB — CUP PACEART REMOTE DEVICE CHECK
Battery Remaining Longevity: 43 mo
Battery Remaining Percentage: 39 %
Battery Voltage: 2.95 V
Brady Statistic AP VP Percent: 3.4 %
Brady Statistic AP VS Percent: 1 %
Brady Statistic AS VP Percent: 97 %
Brady Statistic AS VS Percent: 1 %
Brady Statistic RA Percent Paced: 3.1 %
Date Time Interrogation Session: 20230519024206
Implantable Lead Implant Date: 20170406
Implantable Lead Implant Date: 20170406
Implantable Lead Implant Date: 20170406
Implantable Lead Location: 753858
Implantable Lead Location: 753859
Implantable Lead Location: 753860
Implantable Pulse Generator Implant Date: 20170406
Lead Channel Impedance Value: 440 Ohm
Lead Channel Impedance Value: 460 Ohm
Lead Channel Impedance Value: 810 Ohm
Lead Channel Pacing Threshold Amplitude: 0.5 V
Lead Channel Pacing Threshold Amplitude: 0.875 V
Lead Channel Pacing Threshold Amplitude: 1.25 V
Lead Channel Pacing Threshold Pulse Width: 0.4 ms
Lead Channel Pacing Threshold Pulse Width: 0.4 ms
Lead Channel Pacing Threshold Pulse Width: 0.4 ms
Lead Channel Sensing Intrinsic Amplitude: 12 mV
Lead Channel Sensing Intrinsic Amplitude: 2.1 mV
Lead Channel Setting Pacing Amplitude: 2 V
Lead Channel Setting Pacing Amplitude: 2 V
Lead Channel Setting Pacing Amplitude: 2.25 V
Lead Channel Setting Pacing Pulse Width: 0.4 ms
Lead Channel Setting Pacing Pulse Width: 0.4 ms
Lead Channel Setting Sensing Sensitivity: 2 mV
Pulse Gen Model: 3262
Pulse Gen Serial Number: 7866838

## 2022-03-15 NOTE — Progress Notes (Signed)
Remote pacemaker transmission.   

## 2022-03-16 ENCOUNTER — Ambulatory Visit (INDEPENDENT_AMBULATORY_CARE_PROVIDER_SITE_OTHER): Payer: Medicare Other | Admitting: Podiatry

## 2022-03-16 ENCOUNTER — Encounter: Payer: Self-pay | Admitting: Podiatry

## 2022-03-16 DIAGNOSIS — M79676 Pain in unspecified toe(s): Secondary | ICD-10-CM

## 2022-03-16 DIAGNOSIS — I739 Peripheral vascular disease, unspecified: Secondary | ICD-10-CM | POA: Diagnosis not present

## 2022-03-16 DIAGNOSIS — R531 Weakness: Secondary | ICD-10-CM | POA: Diagnosis not present

## 2022-03-16 DIAGNOSIS — B351 Tinea unguium: Secondary | ICD-10-CM

## 2022-03-16 DIAGNOSIS — D689 Coagulation defect, unspecified: Secondary | ICD-10-CM

## 2022-03-16 DIAGNOSIS — R2681 Unsteadiness on feet: Secondary | ICD-10-CM | POA: Diagnosis not present

## 2022-03-16 NOTE — Progress Notes (Signed)
This patient returns to my office for at risk foot care.  This patient requires this care by a professional since this patient will be at risk due to having renal insufficiency coagulation defect and PAD.   Patient is taking plavix.    This patient is unable to cut nails himself since the patient cannot reach his nails.These nails are painful walking and wearing shoes.  This patient presents for at risk foot care today.  General Appearance  Alert, conversant and in no acute stress.  Vascular  Dorsalis pedis and posterior tibial  pulses are weakly  palpable  bilaterally.  Capillary return is within normal limits  bilaterally. Temperature is within normal limits  bilaterally.  Neurologic  Senn-Weinstein monofilament wire test within normal limits  bilaterally. Muscle power within normal limits bilaterally.  Nails Thick disfigured discolored nails with subungual debris  from hallux to fifth toes bilaterally. No evidence of bacterial infection or drainage bilaterally.  Orthopedic  No limitations of motion  feet .  No crepitus or effusions noted.  No bony pathology or digital deformities noted.  Skin  normotropic skin with no porokeratosis noted bilaterally.  No signs of infections or ulcers noted.     Onychomycosis  Pain in right toes  Pain in left toes  Consent was obtained for treatment procedures.   Mechanical debridement of nails 1-5  bilaterally performed with a nail nipper.  Filed with dremel without incident.    Return office visit   10 weeks                   Told patient to return for periodic foot care and evaluation due to potential at risk complications.   Tensley Wery DPM  

## 2022-03-30 DIAGNOSIS — R2681 Unsteadiness on feet: Secondary | ICD-10-CM | POA: Diagnosis not present

## 2022-03-30 DIAGNOSIS — R531 Weakness: Secondary | ICD-10-CM | POA: Diagnosis not present

## 2022-04-06 ENCOUNTER — Ambulatory Visit (HOSPITAL_COMMUNITY)
Admission: RE | Admit: 2022-04-06 | Discharge: 2022-04-06 | Disposition: A | Discharge status: Home / Self Care | Payer: Medicare Other | Source: Ambulatory Visit | Attending: Internal Medicine | Admitting: Internal Medicine

## 2022-04-06 DIAGNOSIS — I5022 Chronic systolic (congestive) heart failure: Secondary | ICD-10-CM | POA: Diagnosis not present

## 2022-04-06 DIAGNOSIS — R2681 Unsteadiness on feet: Secondary | ICD-10-CM | POA: Diagnosis not present

## 2022-04-06 DIAGNOSIS — R531 Weakness: Secondary | ICD-10-CM | POA: Diagnosis not present

## 2022-04-06 LAB — BASIC METABOLIC PANEL
Anion gap: 9 (ref 5–15)
BUN: 11 mg/dL (ref 8–23)
CO2: 27 mmol/L (ref 22–32)
Calcium: 9.4 mg/dL (ref 8.9–10.3)
Chloride: 102 mmol/L (ref 98–111)
Creatinine, Ser: 0.86 mg/dL (ref 0.61–1.24)
GFR, Estimated: >60 mL/min (ref >60)
Glucose, Bld: 97 mg/dL (ref 70–99)
Potassium: 4.6 mmol/L (ref 3.5–5.1)
Sodium: 138 mmol/L (ref 135–145)

## 2022-04-13 DIAGNOSIS — R531 Weakness: Secondary | ICD-10-CM | POA: Diagnosis not present

## 2022-04-13 DIAGNOSIS — R2681 Unsteadiness on feet: Secondary | ICD-10-CM | POA: Diagnosis not present

## 2022-04-20 DIAGNOSIS — R2681 Unsteadiness on feet: Secondary | ICD-10-CM | POA: Diagnosis not present

## 2022-04-20 DIAGNOSIS — R531 Weakness: Secondary | ICD-10-CM | POA: Diagnosis not present

## 2022-04-27 DIAGNOSIS — R531 Weakness: Secondary | ICD-10-CM | POA: Diagnosis not present

## 2022-04-27 DIAGNOSIS — R2681 Unsteadiness on feet: Secondary | ICD-10-CM | POA: Diagnosis not present

## 2022-04-30 ENCOUNTER — Other Ambulatory Visit (HOSPITAL_COMMUNITY): Payer: Self-pay | Admitting: Cardiology

## 2022-05-04 ENCOUNTER — Other Ambulatory Visit (HOSPITAL_COMMUNITY): Payer: Self-pay | Admitting: Cardiology

## 2022-05-04 DIAGNOSIS — R2681 Unsteadiness on feet: Secondary | ICD-10-CM | POA: Diagnosis not present

## 2022-05-04 DIAGNOSIS — R531 Weakness: Secondary | ICD-10-CM | POA: Diagnosis not present

## 2022-05-10 DIAGNOSIS — L570 Actinic keratosis: Secondary | ICD-10-CM | POA: Diagnosis not present

## 2022-05-10 DIAGNOSIS — L821 Other seborrheic keratosis: Secondary | ICD-10-CM | POA: Diagnosis not present

## 2022-05-10 DIAGNOSIS — D692 Other nonthrombocytopenic purpura: Secondary | ICD-10-CM | POA: Diagnosis not present

## 2022-05-10 DIAGNOSIS — Z85828 Personal history of other malignant neoplasm of skin: Secondary | ICD-10-CM | POA: Diagnosis not present

## 2022-05-10 DIAGNOSIS — D2339 Other benign neoplasm of skin of other parts of face: Secondary | ICD-10-CM | POA: Diagnosis not present

## 2022-05-11 DIAGNOSIS — R2681 Unsteadiness on feet: Secondary | ICD-10-CM | POA: Diagnosis not present

## 2022-05-11 DIAGNOSIS — R531 Weakness: Secondary | ICD-10-CM | POA: Diagnosis not present

## 2022-05-18 DIAGNOSIS — R2681 Unsteadiness on feet: Secondary | ICD-10-CM | POA: Diagnosis not present

## 2022-05-18 DIAGNOSIS — R531 Weakness: Secondary | ICD-10-CM | POA: Diagnosis not present

## 2022-05-25 DIAGNOSIS — R2681 Unsteadiness on feet: Secondary | ICD-10-CM | POA: Diagnosis not present

## 2022-05-25 DIAGNOSIS — R531 Weakness: Secondary | ICD-10-CM | POA: Diagnosis not present

## 2022-06-01 ENCOUNTER — Encounter: Payer: Self-pay | Admitting: Podiatry

## 2022-06-01 ENCOUNTER — Ambulatory Visit (INDEPENDENT_AMBULATORY_CARE_PROVIDER_SITE_OTHER): Payer: Medicare Other | Admitting: Podiatry

## 2022-06-01 DIAGNOSIS — B351 Tinea unguium: Secondary | ICD-10-CM

## 2022-06-01 DIAGNOSIS — D689 Coagulation defect, unspecified: Secondary | ICD-10-CM | POA: Diagnosis not present

## 2022-06-01 DIAGNOSIS — R531 Weakness: Secondary | ICD-10-CM | POA: Diagnosis not present

## 2022-06-01 DIAGNOSIS — I739 Peripheral vascular disease, unspecified: Secondary | ICD-10-CM | POA: Diagnosis not present

## 2022-06-01 DIAGNOSIS — M79676 Pain in unspecified toe(s): Secondary | ICD-10-CM

## 2022-06-01 DIAGNOSIS — R2681 Unsteadiness on feet: Secondary | ICD-10-CM | POA: Diagnosis not present

## 2022-06-01 NOTE — Progress Notes (Signed)
This patient returns to my office for at risk foot care.  This patient requires this care by a professional since this patient will be at risk due to having renal insufficiency coagulation defect and PAD.   Patient is taking plavix.    This patient is unable to cut nails himself since the patient cannot reach his nails.These nails are painful walking and wearing shoes.  This patient presents for at risk foot care today.  General Appearance  Alert, conversant and in no acute stress.  Vascular  Dorsalis pedis and posterior tibial  pulses are weakly  palpable  bilaterally.  Capillary return is within normal limits  bilaterally. Temperature is within normal limits  bilaterally.  Neurologic  Senn-Weinstein monofilament wire test within normal limits  bilaterally. Muscle power within normal limits bilaterally.  Nails Thick disfigured discolored nails with subungual debris  from hallux to fifth toes bilaterally. No evidence of bacterial infection or drainage bilaterally.  Orthopedic  No limitations of motion  feet .  No crepitus or effusions noted.  No bony pathology or digital deformities noted.  Skin  normotropic skin with no porokeratosis noted bilaterally.  No signs of infections or ulcers noted.     Onychomycosis  Pain in right toes  Pain in left toes  Consent was obtained for treatment procedures.   Mechanical debridement of nails 1-5  bilaterally performed with a nail nipper.  Filed with dremel without incident.    Return office visit   10 weeks                   Told patient to return for periodic foot care and evaluation due to potential at risk complications.   Liliana Brentlinger DPM  

## 2022-06-08 ENCOUNTER — Ambulatory Visit (INDEPENDENT_AMBULATORY_CARE_PROVIDER_SITE_OTHER): Payer: Medicare Other

## 2022-06-08 DIAGNOSIS — R2681 Unsteadiness on feet: Secondary | ICD-10-CM | POA: Diagnosis not present

## 2022-06-08 DIAGNOSIS — R531 Weakness: Secondary | ICD-10-CM | POA: Diagnosis not present

## 2022-06-08 DIAGNOSIS — I441 Atrioventricular block, second degree: Secondary | ICD-10-CM | POA: Diagnosis not present

## 2022-06-08 LAB — CUP PACEART REMOTE DEVICE CHECK
Battery Remaining Longevity: 40 mo
Battery Remaining Percentage: 37 %
Battery Voltage: 2.93 V
Brady Statistic AP VP Percent: 2.7 %
Brady Statistic AP VS Percent: 1 %
Brady Statistic AS VP Percent: 97 %
Brady Statistic AS VS Percent: 1 %
Brady Statistic RA Percent Paced: 2.4 %
Date Time Interrogation Session: 20230818020014
Implantable Lead Implant Date: 20170406
Implantable Lead Implant Date: 20170406
Implantable Lead Implant Date: 20170406
Implantable Lead Location: 753858
Implantable Lead Location: 753859
Implantable Lead Location: 753860
Implantable Pulse Generator Implant Date: 20170406
Lead Channel Impedance Value: 430 Ohm
Lead Channel Impedance Value: 450 Ohm
Lead Channel Impedance Value: 750 Ohm
Lead Channel Pacing Threshold Amplitude: 0.5 V
Lead Channel Pacing Threshold Amplitude: 1.125 V
Lead Channel Pacing Threshold Amplitude: 1.375 V
Lead Channel Pacing Threshold Pulse Width: 0.4 ms
Lead Channel Pacing Threshold Pulse Width: 0.4 ms
Lead Channel Pacing Threshold Pulse Width: 0.4 ms
Lead Channel Sensing Intrinsic Amplitude: 1.9 mV
Lead Channel Sensing Intrinsic Amplitude: 12 mV
Lead Channel Setting Pacing Amplitude: 2 V
Lead Channel Setting Pacing Amplitude: 2.125
Lead Channel Setting Pacing Amplitude: 2.375
Lead Channel Setting Pacing Pulse Width: 0.4 ms
Lead Channel Setting Pacing Pulse Width: 0.4 ms
Lead Channel Setting Sensing Sensitivity: 2 mV
Pulse Gen Model: 3262
Pulse Gen Serial Number: 7866838

## 2022-06-15 DIAGNOSIS — R531 Weakness: Secondary | ICD-10-CM | POA: Diagnosis not present

## 2022-06-15 DIAGNOSIS — R2681 Unsteadiness on feet: Secondary | ICD-10-CM | POA: Diagnosis not present

## 2022-06-22 DIAGNOSIS — I5022 Chronic systolic (congestive) heart failure: Secondary | ICD-10-CM | POA: Diagnosis not present

## 2022-06-22 DIAGNOSIS — I130 Hypertensive heart and chronic kidney disease with heart failure and stage 1 through stage 4 chronic kidney disease, or unspecified chronic kidney disease: Secondary | ICD-10-CM | POA: Diagnosis not present

## 2022-06-22 DIAGNOSIS — I700 Atherosclerosis of aorta: Secondary | ICD-10-CM | POA: Diagnosis not present

## 2022-06-22 DIAGNOSIS — I739 Peripheral vascular disease, unspecified: Secondary | ICD-10-CM | POA: Diagnosis not present

## 2022-06-29 DIAGNOSIS — I700 Atherosclerosis of aorta: Secondary | ICD-10-CM | POA: Diagnosis not present

## 2022-06-29 DIAGNOSIS — M459 Ankylosing spondylitis of unspecified sites in spine: Secondary | ICD-10-CM | POA: Diagnosis not present

## 2022-06-29 DIAGNOSIS — D692 Other nonthrombocytopenic purpura: Secondary | ICD-10-CM | POA: Diagnosis not present

## 2022-06-29 DIAGNOSIS — I739 Peripheral vascular disease, unspecified: Secondary | ICD-10-CM | POA: Diagnosis not present

## 2022-07-02 DIAGNOSIS — I5022 Chronic systolic (congestive) heart failure: Secondary | ICD-10-CM | POA: Diagnosis not present

## 2022-07-02 DIAGNOSIS — M8580 Other specified disorders of bone density and structure, unspecified site: Secondary | ICD-10-CM | POA: Diagnosis not present

## 2022-07-02 DIAGNOSIS — Z79899 Other long term (current) drug therapy: Secondary | ICD-10-CM | POA: Diagnosis not present

## 2022-07-02 DIAGNOSIS — M459 Ankylosing spondylitis of unspecified sites in spine: Secondary | ICD-10-CM | POA: Diagnosis not present

## 2022-07-04 ENCOUNTER — Other Ambulatory Visit (HOSPITAL_COMMUNITY): Payer: Self-pay | Admitting: *Deleted

## 2022-07-04 NOTE — Progress Notes (Signed)
Remote pacemaker transmission.   

## 2022-07-05 ENCOUNTER — Other Ambulatory Visit (HOSPITAL_COMMUNITY): Payer: Self-pay

## 2022-07-05 MED ORDER — CLOPIDOGREL BISULFATE 75 MG PO TABS: ORAL_TABLET | ORAL | 1 refills | Status: DC

## 2022-07-06 DIAGNOSIS — R2681 Unsteadiness on feet: Secondary | ICD-10-CM | POA: Diagnosis not present

## 2022-07-06 DIAGNOSIS — R531 Weakness: Secondary | ICD-10-CM | POA: Diagnosis not present

## 2022-07-09 ENCOUNTER — Ambulatory Visit (HOSPITAL_COMMUNITY)
Admission: RE | Admit: 2022-07-09 | Discharge: 2022-07-09 | Disposition: A | Discharge status: Home / Self Care | Payer: Medicare Other | Source: Ambulatory Visit | Attending: Cardiology | Admitting: Cardiology

## 2022-07-09 ENCOUNTER — Encounter (HOSPITAL_COMMUNITY): Payer: Self-pay | Admitting: Cardiology

## 2022-07-09 ENCOUNTER — Other Ambulatory Visit: Payer: Self-pay

## 2022-07-09 VITALS — BP 148/72 | HR 64 | Wt 165.2 lb

## 2022-07-09 DIAGNOSIS — I2510 Atherosclerotic heart disease of native coronary artery without angina pectoris: Secondary | ICD-10-CM | POA: Insufficient documentation

## 2022-07-09 DIAGNOSIS — I5042 Chronic combined systolic (congestive) and diastolic (congestive) heart failure: Secondary | ICD-10-CM | POA: Diagnosis not present

## 2022-07-09 DIAGNOSIS — Z955 Presence of coronary angioplasty implant and graft: Secondary | ICD-10-CM | POA: Insufficient documentation

## 2022-07-09 DIAGNOSIS — Z79899 Other long term (current) drug therapy: Secondary | ICD-10-CM | POA: Diagnosis not present

## 2022-07-09 DIAGNOSIS — Z7902 Long term (current) use of antithrombotics/antiplatelets: Secondary | ICD-10-CM | POA: Diagnosis not present

## 2022-07-09 DIAGNOSIS — I2582 Chronic total occlusion of coronary artery: Secondary | ICD-10-CM | POA: Insufficient documentation

## 2022-07-09 DIAGNOSIS — Z8679 Personal history of other diseases of the circulatory system: Secondary | ICD-10-CM | POA: Insufficient documentation

## 2022-07-09 DIAGNOSIS — I6529 Occlusion and stenosis of unspecified carotid artery: Secondary | ICD-10-CM

## 2022-07-09 DIAGNOSIS — Z9889 Other specified postprocedural states: Secondary | ICD-10-CM

## 2022-07-09 DIAGNOSIS — I6521 Occlusion and stenosis of right carotid artery: Secondary | ICD-10-CM | POA: Insufficient documentation

## 2022-07-09 DIAGNOSIS — I252 Old myocardial infarction: Secondary | ICD-10-CM | POA: Insufficient documentation

## 2022-07-09 DIAGNOSIS — I447 Left bundle-branch block, unspecified: Secondary | ICD-10-CM | POA: Diagnosis not present

## 2022-07-09 DIAGNOSIS — I5022 Chronic systolic (congestive) heart failure: Secondary | ICD-10-CM | POA: Diagnosis not present

## 2022-07-09 DIAGNOSIS — I255 Ischemic cardiomyopathy: Secondary | ICD-10-CM | POA: Insufficient documentation

## 2022-07-09 DIAGNOSIS — E785 Hyperlipidemia, unspecified: Secondary | ICD-10-CM | POA: Diagnosis not present

## 2022-07-09 LAB — CBC
HCT: 38.4 % — ABNORMAL LOW (ref 39.0–52.0)
Hemoglobin: 12.6 g/dL — ABNORMAL LOW (ref 13.0–17.0)
MCH: 32.1 pg (ref 26.0–34.0)
MCHC: 32.8 g/dL (ref 30.0–36.0)
MCV: 98 fL (ref 80.0–100.0)
Platelets: 144 10*3/uL — ABNORMAL LOW (ref 150–400)
RBC: 3.92 MIL/uL — ABNORMAL LOW (ref 4.22–5.81)
RDW: 14.9 % (ref 11.5–15.5)
WBC: 5 10*3/uL (ref 4.0–10.5)
nRBC: 0 % (ref 0.0–0.2)

## 2022-07-09 LAB — LIPID PANEL
Cholesterol: 132 mg/dL (ref 0–200)
HDL: 68 mg/dL (ref >40)
LDL Cholesterol: 49 mg/dL (ref 0–99)
Total CHOL/HDL Ratio: 1.9 RATIO
Triglycerides: 74 mg/dL (ref <150)
VLDL: 15 mg/dL (ref 0–40)

## 2022-07-09 LAB — BASIC METABOLIC PANEL
Anion gap: 10 (ref 5–15)
BUN: 14 mg/dL (ref 8–23)
CO2: 27 mmol/L (ref 22–32)
Calcium: 9.6 mg/dL (ref 8.9–10.3)
Chloride: 102 mmol/L (ref 98–111)
Creatinine, Ser: 0.97 mg/dL (ref 0.61–1.24)
GFR, Estimated: >60 mL/min (ref >60)
Glucose, Bld: 102 mg/dL — ABNORMAL HIGH (ref 70–99)
Potassium: 4.5 mmol/L (ref 3.5–5.1)
Sodium: 139 mmol/L (ref 135–145)

## 2022-07-09 NOTE — Patient Instructions (Signed)
There is no changes to your medications.  Labs done today, your results will be available in MyChart, we will contact you for abnormal readings.  Your physician has requested that you have an echocardiogram. Echocardiography is a painless test that uses sound waves to create images of your heart. It provides your doctor with information about the size and shape of your heart and how well your heart's chambers and valves are working. This procedure takes approximately one hour. There are no restrictions for this procedure.  Your physician recommends that you schedule a follow-up appointment in: 4 months with echocardiogram ( January 2024) ** please call the office in November to arrange your follow up appointment **  If you have any questions or concerns before your next appointment please send [place] a message through [place] or call our office at [phone].    TO LEAVE A MESSAGE FOR THE NURSE SELECT OPTION 2, PLEASE LEAVE A MESSAGE INCLUDING: YOUR NAME DATE OF BIRTH CALL BACK NUMBER REASON FOR CALL**this is important as we prioritize the call backs  YOU WILL RECEIVE A CALL BACK THE SAME DAY AS LONG AS YOU CALL BEFORE 4:00 PM  At the [facility], you and your health needs are our priority. As part of our continuing mission to provide you with exceptional heart care, we have created designated Provider Care Teams. These Care Teams include your primary Cardiologist (physician) and Advanced Practice Providers (APPs- Physician Assistants and Nurse Practitioners) who all work together to provide you with the care you need, when you need it.   You may see any of the following providers on your designated Care Team at your next follow up: Dr Glori Bickers Dr Loralie Champagne Dr. Roxana Hires, NP Lyda Jester, [place] [facility] [place], [place] Forestine Na, NP Audry Riles, PharmD   Please be sure to bring in all your medications bottles to every  appointment.

## 2022-07-09 NOTE — Progress Notes (Signed)
Patient ID: SHAQUELLE HERNON, male   DOB: [birth year], 88 y.o.   MRN: [id number]     Advanced Heart Failure Clinic Note  PCP: Dr. Ashby Dawes Cardiology: Dr. Thresa Ross is a 88 y.o. male with history of CAD, chronic systolic CHF, LBBB, AAA s/p repair presents for followup of CHF and CAD.  He had a cardiac cath in 12/15, showing total occlusion of the LAD and 50-60% proximal RCA.  EF at that time was near-normal.  In 10/16, he developed dyspnea and epigastric pain.  He was noted to be in acute pulmonary edema and was diuresed.  Troponin was only mildly elevated at 0.47.  He ultimately underwent LHC showing hazy 80-90% RCA stenosis which was likely the culprit for his presentation.  Additionally, the LAD was only subtotally occluded on this cath.  He had DES to RCA.  Cardiac MRI was done, showing EF 22% with significant viability in LAD territory.  Therefore, CTO of LAD was opened with DES.  Echo in 2/17 showed EF persistently low at 15%.  He had St Jude CRT-P placement in 4/17. Echo in 1/19 showed EF up to 45-50% with mild MR.    Echo in 3/20 showed EF 45% with mild diffuse hypokinesis, mildly decreased RV systolic function. Echo in 3/21 showed EF 45-50%, mild RV dilation with normal systolic function. Echo in 10/22 showed EF 50-55%, normal RV, mild MR.    He was diagnosed with ankylosing spondylitis and is now on methotrexate.    Chronic low back pain continues to be his main complaint.  He is still working as a Furniture conservator/restorer.  No dyspnea with normal activities.  No chest pain.  No orthopnea/PND.  Overall, stable.  BP high today but has not taken Coreg or spironolactone yet.  No BRBPR/melena.   St Jude device interrogation: >99% BiV pacing, no AF, thoracic impedence stable.   ECG (personally reviewed): NSR, BiV pacing  Labs (10/16): hgb 9.7, K 4.6, creatinine 1.12 Labs (11/16): K 5.9, creatinine 1.5 Labs (12/16); K 4.2, creatinine 1.07 => 1.02, BNP 448 Labs (1/17): K 4.1, creatinine 1.13,  BNP 423, LDL 40, HDL 47 Labs (4/17): K 3.9, creatinine 1.03, HCT 32.6 Labs (5/17): K 4.4, creatinine 1.09 Labs (1/18): LDL 37 Labs (01/25/2017): K 3.5 Creatinine 0.96  Labs (8/18): BNP 30, K 3.7, creatinine 1.0 Labs (11/18): LDL 40, HDL 47, K 3.5, creatinine 0.96  Labs (3/19): K 4, creatinine 0.97 Labs (11/19): K 3.9, creatinine 1.2, LDL 44 Labs (4/20): K 4.1, creatinine 1.11 Labs (10/20): LDL 46 Labs (11/20): K 4.4, creatinine 1.05 Labs (3/21): K 4.2, creatinine 0.92 Labs (7/21): LDL 58 Labs (8/21): K 4, creatinine 1.27 Labs (11/21): K 3.9, creatinine 0.88 Labs (9/22): K 3.6, creatinine 0.82, LDL 44 Labs (6/23): K 4.6, creatinine 0.86  PMH: 1. CKD 2. AAA s/p repair, followed at [facility]. 3. Carotid stenosis: s/p right CEA.   Carotid dopplers (2/16) with right CEA stable, < 26% LICA stenosis.  Followed at [facility].  - Carotid dopplers (7/12): 4-58% LICA, patent right CEA.  4. Chronic LBBB 5. LAD: LHC (12/15) with totally occluded LAD, 50-60% pRCA => medically managed.  10/16 admitted with NSTEMI.  LHC with subtotalled LAD occlusion, 80-90% pRCA with thrombus => DES RCA initially.  Cardiac MRI was done showing viability in the LAD territory, so he had DES to the CTO LAD.   6. Chronic systolic CHF: Ischemic cardiomyopathy.  Echo (10/16) with EF 15% with wall motion abnormalities, moderately dilated RV with  normal systolic function.  CMRI (10/16) with severe LV dilation, EF 22% with septal-lateral dyssynchrony, no LV thrombus, normal RV size/systolic function => significant viability noted in the LAD territory.  Echo (2/17): EF 15%, severe LV dilation, wall motion abnormalities noted, mild MR, mildly dilated RV with normal systolic function, PASP 55 mmHg.  St Jude CRT-P 4/17.  - Echo (1/18): EF 50-55%, mild to moderate MR.  - Echo (1/19): EF 45-50%, mild LVH, inferior hypokinesis, mild MR, PASP 30 mmHg.  - Echo (3/20): EF 45%, diffuse hypokinesis, normal RV size with mildly decreased systolic function.   - Echo (3/21): EF 45-50%, mildly dilated RV with normal systolic function.  - Echo (10/22): EF 50-55%, normal RV, mild MR 7. ABIs (10/16) were normal.  8. Raynaud's syndrome.  9. Sciatica  SH: Lives alone, prior smoker, still working as a Furniture conservator/restorer.  FH: CAD  Review of systems complete and found to be negative unless listed in HPI.    Current Outpatient Medications  Medication Sig Dispense Refill   acetaminophen (TYLENOL) 500 MG tablet Take 1,000 mg by mouth daily as needed for mild pain or headache. Reported on 01/25/2016     atorvastatin (LIPITOR) 40 MG tablet Take 1 tablet (40 mg total) by mouth daily at 6 PM. 30 tablet 6   Calcium Carbonate-Vitamin D (CALCIUM 600 + D PO) Take 1 tablet by mouth 2 (two) times daily.     carvedilol (COREG) 12.5 MG tablet TAKE 1 TABLET(12.5 MG) BY MOUTH TWICE DAILY 60 tablet 11   clopidogrel (PLAVIX) 75 MG tablet TAKE 1 TABLET(75 MG) BY MOUTH DAILY 90 tablet 1   clotrimazole (LOTRIMIN) 1 % cream Apply 1 Application topically as needed.     ENTRESTO 24-26 MG TAKE 1 TABLET BY MOUTH TWICE DAILY 60 tablet 4   finasteride (PROSCAR) 5 MG tablet take 1 tablet by mouth once daily     fluticasone (FLONASE) 50 MCG/ACT nasal spray Place 1 spray into both nostrils daily as needed for allergies.      folic acid (FOLVITE) 1 MG tablet Take 1 mg by mouth daily.     furosemide (LASIX) 20 MG tablet TAKE 1 TABLET(20 MG) BY MOUTH EVERY OTHER DAY 15 tablet 6   gabapentin (NEURONTIN) 300 MG capsule Take 300 mg by mouth 2 (two) times daily.  0   meclizine (ANTIVERT) 25 MG tablet Take 1 tablet (25 mg total) by mouth 2 (two) times daily as needed for dizziness. 30 tablet 0   meloxicam (MOBIC) 15 MG tablet Take 15 mg by mouth daily as needed for pain.  0   methotrexate (RHEUMATREX) 2.5 MG tablet Take 2.5 mg by mouth once a week. Caution:Chemotherapy. Protect from light. Takes 7 tablets     mometasone (ELOCON) 0.1 % cream Apply 1 application topically daily as needed (for  irritation).      Multiple Vitamin (MULTIVITAMIN PO) Take 1 tablet by mouth daily. ALIVE men's vitamin     nitroGLYCERIN (NITROSTAT) 0.4 MG SL tablet PLACE 1 TABLET UNDER THE TONGUE IF NEEDED EVERY 5 MINUTES FOR CHEST PAIN FOR 3 DOSES IF NO RELIEF AFTER FIRST DOSE CALL PRESCRIBER OR 911 25 tablet 3   pantoprazole (PROTONIX) 40 MG tablet Take 40 mg by mouth daily.     spironolactone (ALDACTONE) 25 MG tablet Take 1 tablet (25 mg total) by mouth daily. 90 tablet 3   sulfaSALAzine (AZULFIDINE) 500 MG tablet Take 1,500 mg by mouth 2 (two) times daily.     No current facility-administered  medications for this encounter.   BP (!) 148/72   Pulse 64   Wt 74.9 kg (165 lb 3.2 oz)   SpO2 98%   BMI 23.70 kg/m   Wt Readings from Last 3 Encounters:  07/09/22 74.9 kg (165 lb 3.2 oz)  01/05/22 76 kg (167 lb 9.6 oz)  11/20/21 75.3 kg (166 lb)    General: NAD Neck: No JVD, no thyromegaly or thyroid nodule.  Lungs: Clear to auscultation bilaterally with normal respiratory effort. CV: Nondisplaced PMI.  Heart regular S1/S2, no S3/S4, no murmur.  No peripheral edema.  No carotid bruit.  Normal pedal pulses.  Abdomen: Soft, nontender, no hepatosplenomegaly, no distention.  Skin: Intact without lesions or rashes.  Neurologic: Alert and oriented x 3.  Psych: Normal affect. Extremities: No clubbing or cyanosis.  HEENT: Normal.  MSK: Kyphosis  Assessment/Plan: 1. CAD: NSTEMI with DES to RCA (culprit vessel) and subsequent DES to CTO of LAD (cMRI showed viability in the LAD distribution) in 10/16. No chest pain.  - Continue Plavix, CBC today.  - atorvastatin 40 daily, check lipids.  2. Chronic systolic/diastolic heart failure: Ischemic cardiomyopathy. Echo in 3/21 showed EF 45-50%, improved since getting CRT.  Echo in 10/22 showed EF 50-55%.  St Jude CRT-D.  NYHA class II.  Not volume overloaded by exam or [place].   - Continue Lasix 20 mg qod. BMET today.  - Continue Coreg 12.5 mg bid  - Continue  spironolactone 25 mg daily.   - Continue Entresto 24/26 mg BID.  - Repeat echo in 4 months at followup.  3. Carotid stenosis: Minimal carotid stenosis on 9/22 dopplers.    Followup 4 months with echo.   Loralie Champagne, MD  07/09/2022

## 2022-07-11 NOTE — Progress Notes (Unsigned)
HISTORY AND PHYSICAL     CC:  follow up. Requesting Provider:  Merrilee Seashore, MD  HPI: Henry Neal is a 88 y.o. male who is here today for follow up.  He has hx of right CEA for symptomatic carotid artery stenosis in 2015 by Dr. Oneida Alar and EVAR on 02/23/2009 by Dr. Donnetta Hutching for AAA.   Pt was last seen on 01/09/2018.  At that time, he was doing well without stroke sx or abdominal or back pain.  He was still working full time as a Furniture conservator/restorer.  He was going to cardiac rehab weekly.  His duplex revealed stable AAA at 3.5cm and aneurysm was excluded.  He had u/s to rule out popliteal aneurysm and it was negative.    Pt has hx of CAD with hx of stenting, PPM, CHF, ankylosis spondylitis, chronic low back pain.  The pt returns today for follow up.  He states he has been doing well.  He denies any abdominal pain.    Pt denies any amaurosis fugax, trouble finding his words, weakness, numbness, paralysis or clumsiness or facial droop.  He states he sometimes has trouble actually physically talking.  He states he sometimes gets some right arm numbness when he sleeps on his right shoulder, he states his arm goes to sleep.  He tells me he saw Dr. Aundra Dubin on Monday and he is adjusting his BP meds.    Pt denies claudication, rest pain, or non healing wounds.  He states he gets some swelling in both legs with the right swelling more than the left.  He has had injury to the right knee many years ago and has had some swelling for years.  He does wear knee high compression socks.    He states he is still working as a Furniture conservator/restorer.    He has been compliant with his plavix and lipitor.   The pt is on a statin for cholesterol management.    The pt is not on an aspirin.    Other AC:  Plavix The pt is on BB, ARB, diuretic for hypertension.  The pt does not have diabetes. Tobacco hx:  former  Past Medical History:  Diagnosis Date   AAA (abdominal aortic aneurysm) (HCC)    Abnormal stress test    Allergic  rhinitis    uses Flonase daily   Anginal pain (HCC)    pain in neck,shoulders,down arms occ   Arthritis    Cancer (HCC)    skin   Carotid artery occlusion    Chronic venous insufficiency    Coronary artery disease    Dizziness    takes Antivert daily as needed   ED (erectile dysfunction)    GERD (gastroesophageal reflux disease)    takes Omeprazole daily   Hiatal hernia    Hx of echocardiogram 04/04/2010   showed a borderline dilatd left ventricle with mild left ventricle hypertrophy and an EF 50-55% doppler suggested diastolic dysfunction, although the pattern is probably normal for an 88 yrs old. The left atrium was indeed mildly dilated at 42 mm, but there are no significant valvular abnormalities.    Hyperlipidemia    Hypertension    takes Proscar,Dyazide,Avapro,and Metoprolol daily   Myocardial infarction [facility]) Oct. 11, 2016   Heart Attack   Palpitation    Shortness of breath    Sigmoid diverticulosis    T12 compression fracture (HCC)    Thyroid disease     Past Surgical History:  Procedure Laterality Date   ABDOMINAL AORTIC  ANEURYSM REPAIR  02/24/2009   AAA Stenting   CARDIAC CATHETERIZATION N/A 08/04/2015   Procedure: Right/Left Heart Cath and Coronary Angiography;  Surgeon: Larey Dresser, MD;  Location: [place] [facility];  Service: Cardiovascular;  Laterality: N/A;   CARDIAC CATHETERIZATION N/A 08/04/2015   Procedure: Coronary Stent Intervention;  Surgeon: Burnell Blanks, MD;  Location: [place] [facility];  Service: Cardiovascular;  Laterality: N/A;   CARDIAC CATHETERIZATION N/A 08/08/2015   Procedure: Coronary Stent Intervention;  Surgeon: Peter M Martinique, MD;  Location: [place] [facility];  Service: Cardiovascular;  Laterality: N/A;   ENDARTERECTOMY Right 10/27/2013   Procedure: ENDARTERECTOMY CAROTID;  Surgeon: Elam Dutch, MD;  Location: [place];  Service: Vascular;  Laterality: Right;   EP IMPLANTABLE DEVICE N/A 01/26/2016   Procedure: BiV Pacemaker  Insertion CRT-P;  Surgeon: Evans Lance, MD;  Location: [place] [facility];  Service: Cardiovascular;  Laterality: N/A;   HAND SURGERY Right    tendon, lft 90's   HERNIA REPAIR Bilateral    LEFT HEART CATHETERIZATION WITH CORONARY ANGIOGRAM N/A 10/12/2013   Procedure: LEFT HEART CATHETERIZATION WITH CORONARY ANGIOGRAM;  Surgeon: Lorretta Harp, MD;  Location: [facility];  Service: Cardiovascular;  Laterality: N/A;   melanoma removed     ROTATOR CUFF REPAIR  2003   left arm    Allergies  Allergen Reactions   Doxepin Other (See Comments)    hallucinations  hallucinations Hallucinations  hallucinations Hallucinations hallucinations Hallucinations   Sinequan [Doxepin Hcl] Other (See Comments)    hallucinations   Meloxicam Other (See Comments)   Verapamil Other (See Comments)    Caused heart to race Caused heart to race  Caused heart to race Caused heart to race   Procaine Itching and Other (See Comments)    Tingling all over as soon as it was injected  Tingling all over as soon as it was injected Tingling all over as soon as it was injected    Current Outpatient Medications  Medication Sig Dispense Refill   acetaminophen (TYLENOL) 500 MG tablet Take 1,000 mg by mouth daily as needed for mild pain or headache. Reported on 01/25/2016     atorvastatin (LIPITOR) 40 MG tablet Take 1 tablet (40 mg total) by mouth daily at 6 PM. 30 tablet 6   Calcium Carbonate-Vitamin D (CALCIUM 600 + D PO) Take 1 tablet by mouth 2 (two) times daily.     carvedilol (COREG) 12.5 MG tablet TAKE 1 TABLET(12.5 MG) BY MOUTH TWICE DAILY 60 tablet 11   clopidogrel (PLAVIX) 75 MG tablet TAKE 1 TABLET(75 MG) BY MOUTH DAILY 90 tablet 1   clotrimazole (LOTRIMIN) 1 % cream Apply 1 Application topically as needed.     ENTRESTO 24-26 MG TAKE 1 TABLET BY MOUTH TWICE DAILY 60 tablet 4   finasteride (PROSCAR) 5 MG tablet take 1 tablet by mouth once daily     fluticasone (FLONASE) 50 MCG/ACT nasal spray Place  1 spray into both nostrils daily as needed for allergies.      folic acid (FOLVITE) 1 MG tablet Take 1 mg by mouth daily.     furosemide (LASIX) 20 MG tablet TAKE 1 TABLET(20 MG) BY MOUTH EVERY OTHER DAY 15 tablet 6   gabapentin (NEURONTIN) 300 MG capsule Take 300 mg by mouth 2 (two) times daily.  0   meclizine (ANTIVERT) 25 MG tablet Take 1 tablet (25 mg total) by mouth 2 (two) times daily as needed for dizziness. 30 tablet 0  meloxicam (MOBIC) 15 MG tablet Take 15 mg by mouth daily as needed for pain.  0   methotrexate (RHEUMATREX) 2.5 MG tablet Take 2.5 mg by mouth once a week. Caution:Chemotherapy. Protect from light. Takes 7 tablets     mometasone (ELOCON) 0.1 % cream Apply 1 application topically daily as needed (for irritation).      Multiple Vitamin (MULTIVITAMIN PO) Take 1 tablet by mouth daily. ALIVE men's vitamin     nitroGLYCERIN (NITROSTAT) 0.4 MG SL tablet PLACE 1 TABLET UNDER THE TONGUE IF NEEDED EVERY 5 MINUTES FOR CHEST PAIN FOR 3 DOSES IF NO RELIEF AFTER FIRST DOSE CALL PRESCRIBER OR 911 25 tablet 3   pantoprazole (PROTONIX) 40 MG tablet Take 40 mg by mouth daily.     spironolactone (ALDACTONE) 25 MG tablet Take 1 tablet (25 mg total) by mouth daily. 90 tablet 3   sulfaSALAzine (AZULFIDINE) 500 MG tablet Take 1,500 mg by mouth 2 (two) times daily.     No current facility-administered medications for this visit.    Family History  Problem Relation Age of Onset   Heart disease Mother        before age 23   COPD Mother    Diabetes Mother    Hyperlipidemia Mother    Hypertension Mother     Social History   Socioeconomic History   Marital status: Widowed    Spouse name: Not on file   Number of children: Not on file   Years of education: Not on file   Highest education level: Not on file  Occupational History   Not on file  Tobacco Use   Smoking status: Former    Packs/day: 2.00    Years: 30.00    Total pack years: 60.00    Types: Cigarettes    Quit date:  10/22/1977    Years since quitting: 44.7   Smokeless tobacco: Former    Quit date: 10/22/1984  Substance and Sexual Activity   Alcohol use: Yes    Alcohol/week: 7.0 standard drinks of alcohol    Types: 7 Cans of beer per week   Drug use: No   Sexual activity: Not on file  Other Topics Concern   Not on file  Social History Narrative   Not on file   Social Determinants of Health   Financial Resource Strain: Not on file  Food Insecurity: Not on file  Transportation Needs: Not on file  Physical Activity: Not on file  Stress: Not on file  Social Connections: Not on file  Intimate Partner Violence: Not on file     REVIEW OF SYSTEMS:   '[X]'$  denotes positive finding, '[ ]'$  denotes negative finding Cardiac  Comments:  Chest pain or chest pressure:    Shortness of breath upon exertion:    Short of breath when lying flat:    Irregular heart rhythm:        Vascular    Pain in calf, thigh, or hip brought on by ambulation:    Pain in feet at night that wakes you up from your sleep:     Blood clot in your veins:    Leg swelling:         Pulmonary    Oxygen at home:    Wheezing:         Neurologic    Sudden weakness in arms or legs:     Sudden numbness in arms or legs:     Sudden onset of difficulty speaking or understanding others  Temporary loss of vision in one eye:     Problems with dizziness:         Gastrointestinal    Blood in stool:     Vomited blood:         Genitourinary    Burning when urinating:     Blood in urine:        Psychiatric    Major depression:         Hematologic    Bleeding problems:    Problems with blood clotting too easily:        Skin    Rashes or ulcers:        Constitutional    Fever or chills:      PHYSICAL EXAMINATION:  Today's Vitals   07/12/22 0933 07/12/22 0935  BP: (!) 150/63 (!) 146/60  Pulse: (!) 57   Resp: 20   Temp: 97.6 F (36.4 C)   TempSrc: Temporal   SpO2: 98%   Weight: 165 lb (74.8 kg)   Height: '5\' 10"'$   (1.778 m)    Body mass index is 23.68 kg/m.   General:  WDWN in NAD; vital signs documented above Gait: Not observed HENT: WNL, normocephalic Pulmonary: normal non-labored breathing  Cardiac: regular HR;  with carotid bruit on the right Abdomen: soft, NT, aortic pulse is not palpable Skin: without rashes Vascular Exam/Pulses:  Right Left  Radial 2+ (normal) 2+ (normal)  Femoral 2+ (normal) 2+ (normal)  Popliteal Unable to palpate Unable to palpate  DP Monophasic doppler Monophasic doppler  PT Brisk biphasic doppler Brisk biphasic doppler   Extremities: BLE with right > left.  Surface varicosities present bilaterally; no evidence of ischemia or non healing wounds Musculoskeletal: no muscle wasting or atrophy  Neurologic: A&O X 3;  speech is fluent/normal; moving all extremities equally  Psychiatric:  The pt has Normal affect.   Non-Invasive Vascular Imaging:   EVAR duplex on 07/12/2022: Endovascular Aortic Repair (EVAR):  +----------+----------------+-------------------+-------------------+            Diameter AP (cm)Diameter Trans (cm)Velocities (cm/sec)  +----------+----------------+-------------------+-------------------+  Aorta     3.44                               77                   +----------+----------------+-------------------+-------------------+  Right Limb1.21            1.20               98                   +----------+----------------+-------------------+-------------------+  Left Limb 1.28            1.16               84                   +----------+----------------+-------------------+-------------------+   Summary:  Abdominal Aorta: Patent endovascular aneurysm repair with no evidence of endoleak. The largest aortic diameter remains essentially unchanged compared to prior exam. Previous diameter measurement was 3.5 cm obtained on 01/09/2018.   Non-Invasive Vascular Imaging:   Carotid Duplex on 07/12/2022: Right:  1-39% ICA  stenosis Left:  1-39% ICA stenosis Vertebrals:  Bilateral vertebral arteries demonstrate antegrade flow.  Subclavians: Normal flow hemodynamics were seen in bilateral subclavian arteries.   Previous EVAR duplex on 01/10/2018: Abdominal Aorta Findings:  +-----------+-------+----------+----------+--------+--------+--------+  Location   AP (cm)Trans (cm)PSV (cm/s)WaveformThrombusComments  +-----------+-------+----------+----------+--------+--------+--------+  Mid        3.5              140                                 +-----------+-------+----------+----------+--------+--------+--------+  RT CIA Prox1.2    1.2       137                                 +-----------+-------+----------+----------+--------+--------+--------+  LT CIA Prox1.2    1.2       177                                 +-----------+-------+----------+----------+--------+--------+--------+  Final Interpretation:  Abdominal Aorta: The largest aortic measurement is 3.5 cm. Patent endovascular aneurysm repair with no evidence of endoleak. The largest aortic diameter remains essentially unchanged compared to prior exam. Previous diameter measurement was 3.4 cm obtained on 12/27/2016.  Previous Carotid duplex on 07/13/2021: Right: normal Left:   1-39% ICA stenosis    ASSESSMENT/PLAN:: 88 y.o. male here for follow up forAAA and carotid stenosis with hx of hx of right CEA for symptomatic carotid artery stenosis in 2015 by Dr. Oneida Alar and EVAR on 02/23/2009 by Dr. Donnetta Hutching for AAA.    EVAR -pt duplex today reveals his aortic diameter is essentially unchanged from 3 years ago and there is not evidence of endoleak.   -pt does not have rest pain, claudication, non healing wounds. -pt will f/u in one year with EVAR duplex   Carotid stenosis -duplex today reveals bilateral ICA stenosis of 1-39%.  He remains asymptomatic.   -discussed s/s of stroke with pt and he understands should he develop any of these  sx, he will go to the nearest ER or call 911. -pt will f/u in one year with duplex  Leg swelling -pt has bilateral leg swelling more so in the right leg.  He does wear compression socks.  He does not really elevate.  He has multiple surface varicosities as well as an injury to the right leg many years ago.  He did get a new pair of thigh high compression today.  Gave him a handout on how to properly elevate his legs and he will try this.  He does not have claudication or non healing wounds and has brisk doppler flow bilaterally.  He is also active, which is also good for his swelling.    -continue statin/Plavix -he will call sooner if he has any issues before his next visit.    Leontine Locket, [facility] [phone]  Clinic MD:   Scot Dock

## 2022-07-12 ENCOUNTER — Ambulatory Visit: Payer: Medicare Other | Admitting: Physician Assistant

## 2022-07-12 ENCOUNTER — Ambulatory Visit (HOSPITAL_COMMUNITY)
Admission: RE | Admit: 2022-07-12 | Discharge: 2022-07-12 | Disposition: A | Discharge status: Home / Self Care | Payer: Medicare Other | Source: Ambulatory Visit | Attending: Vascular Surgery | Admitting: Vascular Surgery

## 2022-07-12 ENCOUNTER — Ambulatory Visit (INDEPENDENT_AMBULATORY_CARE_PROVIDER_SITE_OTHER)
Admission: RE | Admit: 2022-07-12 | Discharge: 2022-07-12 | Disposition: A | Discharge status: Home / Self Care | Payer: Medicare Other | Source: Ambulatory Visit | Attending: Vascular Surgery | Admitting: Vascular Surgery

## 2022-07-12 VITALS — BP 146/60 | HR 57 | Temp 97.6°F | Resp 20 | Ht 70.0 in | Wt 165.0 lb

## 2022-07-12 DIAGNOSIS — I8393 Asymptomatic varicose veins of bilateral lower extremities: Secondary | ICD-10-CM

## 2022-07-12 DIAGNOSIS — Z8679 Personal history of other diseases of the circulatory system: Secondary | ICD-10-CM | POA: Diagnosis not present

## 2022-07-12 DIAGNOSIS — Z9889 Other specified postprocedural states: Secondary | ICD-10-CM | POA: Insufficient documentation

## 2022-07-12 DIAGNOSIS — I6529 Occlusion and stenosis of unspecified carotid artery: Secondary | ICD-10-CM | POA: Diagnosis not present

## 2022-07-12 DIAGNOSIS — I6523 Occlusion and stenosis of bilateral carotid arteries: Secondary | ICD-10-CM

## 2022-07-13 DIAGNOSIS — R2681 Unsteadiness on feet: Secondary | ICD-10-CM | POA: Diagnosis not present

## 2022-07-13 DIAGNOSIS — R531 Weakness: Secondary | ICD-10-CM | POA: Diagnosis not present

## 2022-08-03 DIAGNOSIS — Z23 Encounter for immunization: Secondary | ICD-10-CM | POA: Diagnosis not present

## 2022-08-07 DIAGNOSIS — Z85828 Personal history of other malignant neoplasm of skin: Secondary | ICD-10-CM | POA: Diagnosis not present

## 2022-08-07 DIAGNOSIS — L820 Inflamed seborrheic keratosis: Secondary | ICD-10-CM | POA: Diagnosis not present

## 2022-08-07 DIAGNOSIS — L218 Other seborrheic dermatitis: Secondary | ICD-10-CM | POA: Diagnosis not present

## 2022-08-07 DIAGNOSIS — L2089 Other atopic dermatitis: Secondary | ICD-10-CM | POA: Diagnosis not present

## 2022-08-10 ENCOUNTER — Ambulatory Visit (INDEPENDENT_AMBULATORY_CARE_PROVIDER_SITE_OTHER): Payer: Medicare Other | Admitting: Podiatry

## 2022-08-10 DIAGNOSIS — M79676 Pain in unspecified toe(s): Secondary | ICD-10-CM | POA: Diagnosis not present

## 2022-08-10 DIAGNOSIS — B351 Tinea unguium: Secondary | ICD-10-CM

## 2022-08-10 DIAGNOSIS — I739 Peripheral vascular disease, unspecified: Secondary | ICD-10-CM

## 2022-08-10 DIAGNOSIS — D689 Coagulation defect, unspecified: Secondary | ICD-10-CM

## 2022-08-10 NOTE — Progress Notes (Signed)
This patient returns to my office for at risk foot care.  This patient requires this care by a professional since this patient will be at risk due to having renal insufficiency coagulation defect and PAD.   Patient is taking plavix.    This patient is unable to cut nails himself since the patient cannot reach his nails.These nails are painful walking and wearing shoes.  This patient presents for at risk foot care today.  General Appearance  Alert, conversant and in no acute stress.  Vascular  Dorsalis pedis and posterior tibial  pulses are weakly  palpable  bilaterally.  Capillary return is within normal limits  bilaterally. Temperature is within normal limits  bilaterally.  Neurologic  Senn-Weinstein monofilament wire test within normal limits  bilaterally. Muscle power within normal limits bilaterally.  Nails Thick disfigured discolored nails with subungual debris  from hallux to fifth toes bilaterally. No evidence of bacterial infection or drainage bilaterally.  Orthopedic  No limitations of motion  feet .  No crepitus or effusions noted.  No bony pathology or digital deformities noted.  Skin  normotropic skin with no porokeratosis noted bilaterally.  No signs of infections or ulcers noted.     Onychomycosis  Pain in right toes  Pain in left toes  Consent was obtained for treatment procedures.   Mechanical debridement of nails 1-5  bilaterally performed with a nail nipper.  Filed with dremel without incident.    Return office visit   10 weeks                   Told patient to return for periodic foot care and evaluation due to potential at risk complications.   Colson Barco DPM  

## 2022-08-30 ENCOUNTER — Other Ambulatory Visit (HOSPITAL_COMMUNITY): Payer: Self-pay | Admitting: Cardiology

## 2022-09-07 ENCOUNTER — Ambulatory Visit (INDEPENDENT_AMBULATORY_CARE_PROVIDER_SITE_OTHER): Payer: Medicare Other

## 2022-09-07 DIAGNOSIS — I441 Atrioventricular block, second degree: Secondary | ICD-10-CM

## 2022-09-07 LAB — CUP PACEART REMOTE DEVICE CHECK
Battery Remaining Longevity: 37 mo
Battery Remaining Percentage: 34 %
Battery Voltage: 2.93 V
Brady Statistic AP VP Percent: 3 %
Brady Statistic AP VS Percent: 1 %
Brady Statistic AS VP Percent: 97 %
Brady Statistic AS VS Percent: 1 %
Brady Statistic RA Percent Paced: 2.7 %
Date Time Interrogation Session: 20231117020130
Implantable Lead Connection Status: 753985
Implantable Lead Connection Status: 753985
Implantable Lead Connection Status: 753985
Implantable Lead Implant Date: 20170406
Implantable Lead Implant Date: 20170406
Implantable Lead Implant Date: 20170406
Implantable Lead Location: 753858
Implantable Lead Location: 753859
Implantable Lead Location: 753860
Implantable Pulse Generator Implant Date: 20170406
Lead Channel Impedance Value: 430 Ohm
Lead Channel Impedance Value: 460 Ohm
Lead Channel Impedance Value: 810 Ohm
Lead Channel Pacing Threshold Amplitude: 0.5 V
Lead Channel Pacing Threshold Amplitude: 1.125 V
Lead Channel Pacing Threshold Amplitude: 1.375 V
Lead Channel Pacing Threshold Pulse Width: 0.4 ms
Lead Channel Pacing Threshold Pulse Width: 0.4 ms
Lead Channel Pacing Threshold Pulse Width: 0.4 ms
Lead Channel Sensing Intrinsic Amplitude: 1.7 mV
Lead Channel Sensing Intrinsic Amplitude: 12 mV
Lead Channel Setting Pacing Amplitude: 2 V
Lead Channel Setting Pacing Amplitude: 2.125
Lead Channel Setting Pacing Amplitude: 2.375
Lead Channel Setting Pacing Pulse Width: 0.4 ms
Lead Channel Setting Pacing Pulse Width: 0.4 ms
Lead Channel Setting Sensing Sensitivity: 2 mV
Pulse Gen Model: 3262
Pulse Gen Serial Number: 7866838

## 2022-09-24 NOTE — Progress Notes (Signed)
Remote pacemaker transmission.   

## 2022-09-25 ENCOUNTER — Other Ambulatory Visit (HOSPITAL_COMMUNITY): Payer: Self-pay | Admitting: Cardiology

## 2022-09-29 ENCOUNTER — Other Ambulatory Visit (HOSPITAL_COMMUNITY): Payer: Self-pay | Admitting: Cardiology

## 2022-10-05 DIAGNOSIS — M8580 Other specified disorders of bone density and structure, unspecified site: Secondary | ICD-10-CM | POA: Diagnosis not present

## 2022-10-05 DIAGNOSIS — M459 Ankylosing spondylitis of unspecified sites in spine: Secondary | ICD-10-CM | POA: Diagnosis not present

## 2022-10-05 DIAGNOSIS — I5022 Chronic systolic (congestive) heart failure: Secondary | ICD-10-CM | POA: Diagnosis not present

## 2022-10-05 DIAGNOSIS — Z79899 Other long term (current) drug therapy: Secondary | ICD-10-CM | POA: Diagnosis not present

## 2022-10-19 ENCOUNTER — Encounter: Payer: Self-pay | Admitting: Podiatry

## 2022-10-19 ENCOUNTER — Ambulatory Visit (INDEPENDENT_AMBULATORY_CARE_PROVIDER_SITE_OTHER): Payer: Medicare Other | Admitting: Podiatry

## 2022-10-19 DIAGNOSIS — M79676 Pain in unspecified toe(s): Secondary | ICD-10-CM

## 2022-10-19 DIAGNOSIS — I739 Peripheral vascular disease, unspecified: Secondary | ICD-10-CM | POA: Diagnosis not present

## 2022-10-19 DIAGNOSIS — D689 Coagulation defect, unspecified: Secondary | ICD-10-CM

## 2022-10-19 DIAGNOSIS — B351 Tinea unguium: Secondary | ICD-10-CM | POA: Diagnosis not present

## 2022-10-19 NOTE — Progress Notes (Signed)
This patient returns to my office for at risk foot care.  This patient requires this care by a professional since this patient will be at risk due to having renal insufficiency coagulation defect and PAD.   Patient is taking plavix.    This patient is unable to cut nails himself since the patient cannot reach his nails.These nails are painful walking and wearing shoes.  This patient presents for at risk foot care today.  General Appearance  Alert, conversant and in no acute stress.  Vascular  Dorsalis pedis and posterior tibial  pulses are weakly  palpable  bilaterally.  Capillary return is within normal limits  bilaterally. Temperature is within normal limits  bilaterally.  Neurologic  Senn-Weinstein monofilament wire test within normal limits  bilaterally. Muscle power within normal limits bilaterally.  Nails Thick disfigured discolored nails with subungual debris  from hallux to fifth toes bilaterally. No evidence of bacterial infection or drainage bilaterally.  Orthopedic  No limitations of motion  feet .  No crepitus or effusions noted.  No bony pathology or digital deformities noted.  Skin  normotropic skin with no porokeratosis noted bilaterally.  No signs of infections or ulcers noted.     Onychomycosis  Pain in right toes  Pain in left toes  Consent was obtained for treatment procedures.   Mechanical debridement of nails 1-5  bilaterally performed with a nail nipper.  Filed with dremel without incident.    Return office visit   10 weeks                   Told patient to return for periodic foot care and evaluation due to potential at risk complications.   Gardiner Barefoot DPM

## 2022-10-25 ENCOUNTER — Telehealth: Payer: Self-pay

## 2022-10-25 NOTE — Patient Outreach (Signed)
  Care Coordination   10/25/2022 Name: Henry Neal MRN: [id number] DOB: 1927-10-19   Care Coordination Outreach Attempts:  An unsuccessful telephone outreach was attempted today to offer the patient information about available care coordination services as a benefit of their health plan.   Follow Up Plan:  Additional outreach attempts will be made to offer the patient care coordination information and services.   Encounter Outcome:  No Answer   Care Coordination Interventions:  No, not indicated    Jone Baseman, RN, MSN [place] [facility] Care Management Coordinator Direct Line [phone]

## 2022-11-07 ENCOUNTER — Telehealth: Payer: Self-pay

## 2022-11-07 NOTE — Patient Instructions (Signed)
Visit Information  Thank you for taking time to visit with me today. Please don't hesitate to contact me if I can be of assistance to you.   Following are the goals we discussed today:   Goals Addressed             This Visit's Progress    COMPLETED: Care coordination Activities-No follow up required       Care Coordination Interventions: Advised patient to Annual Wellness exam. Discussed [facility] services and support. Assessed SDOH. Advised to discuss with primary care physician if services needed in the future.         If you are experiencing a Mental Health or [place] or need someone to talk to, please call the Suicide and Crisis Lifeline: 988   Patient verbalizes understanding of instructions and care plan provided today and agrees to view in [place]. Active MyChart status and patient understanding of how to access instructions and care plan via MyChart confirmed with patient.     No further follow up required: decline  Jone Baseman, RN, MSN [place] [facility] Care Management Coordinator Direct Line [phone]

## 2022-11-07 NOTE — Patient Outreach (Signed)
  Care Coordination   Initial Visit Note   11/07/2022 Name: SHLOIMY MICHALSKI MRN: [id number] DOB: [birth year]  ALEXANDRE FARIES is a 89 y.o. year old male who sees Merrilee Seashore, MD for primary care. I spoke with  [name] by phone today.  What matters to the patients health and wellness today?  none    Goals Addressed             This Visit's Progress    COMPLETED: Care coordination Activities-No follow up required       Care Coordination Interventions: Advised patient to Annual Wellness exam. Discussed [facility] services and support. Assessed SDOH. Advised to discuss with primary care physician if services needed in the future.        SDOH assessments and interventions completed:  Yes  SDOH Interventions Today    Flowsheet Row Most Recent Value  SDOH Interventions   Food Insecurity Interventions Intervention Not Indicated  Housing Interventions Intervention Not Indicated        Care Coordination Interventions:  Yes, provided   Follow up plan: No further intervention required.   Encounter Outcome:  Pt. Visit Completed   Jone Baseman, RN, MSN [place] [facility] Care Management Coordinator Direct Line [phone]

## 2022-11-08 ENCOUNTER — Ambulatory Visit (HOSPITAL_BASED_OUTPATIENT_CLINIC_OR_DEPARTMENT_OTHER)
Admission: RE | Admit: 2022-11-08 | Discharge: 2022-11-08 | Disposition: A | Discharge status: Home / Self Care | Payer: Medicare Other | Source: Ambulatory Visit | Attending: Cardiology | Admitting: Cardiology

## 2022-11-08 ENCOUNTER — Encounter (HOSPITAL_COMMUNITY): Payer: Self-pay | Admitting: Cardiology

## 2022-11-08 ENCOUNTER — Ambulatory Visit (HOSPITAL_COMMUNITY)
Admission: RE | Admit: 2022-11-08 | Discharge: 2022-11-08 | Disposition: A | Discharge status: Home / Self Care | Payer: Medicare Other | Source: Ambulatory Visit | Attending: Internal Medicine | Admitting: Internal Medicine

## 2022-11-08 VITALS — BP 92/40 | HR 54 | Wt 168.0 lb

## 2022-11-08 DIAGNOSIS — Z7902 Long term (current) use of antithrombotics/antiplatelets: Secondary | ICD-10-CM | POA: Diagnosis not present

## 2022-11-08 DIAGNOSIS — M459 Ankylosing spondylitis of unspecified sites in spine: Secondary | ICD-10-CM | POA: Insufficient documentation

## 2022-11-08 DIAGNOSIS — I447 Left bundle-branch block, unspecified: Secondary | ICD-10-CM | POA: Insufficient documentation

## 2022-11-08 DIAGNOSIS — Z79899 Other long term (current) drug therapy: Secondary | ICD-10-CM | POA: Diagnosis not present

## 2022-11-08 DIAGNOSIS — M549 Dorsalgia, unspecified: Secondary | ICD-10-CM | POA: Diagnosis not present

## 2022-11-08 DIAGNOSIS — I255 Ischemic cardiomyopathy: Secondary | ICD-10-CM | POA: Insufficient documentation

## 2022-11-08 DIAGNOSIS — I252 Old myocardial infarction: Secondary | ICD-10-CM | POA: Diagnosis not present

## 2022-11-08 DIAGNOSIS — I2582 Chronic total occlusion of coronary artery: Secondary | ICD-10-CM | POA: Insufficient documentation

## 2022-11-08 DIAGNOSIS — I2510 Atherosclerotic heart disease of native coronary artery without angina pectoris: Secondary | ICD-10-CM | POA: Insufficient documentation

## 2022-11-08 DIAGNOSIS — Z955 Presence of coronary angioplasty implant and graft: Secondary | ICD-10-CM | POA: Insufficient documentation

## 2022-11-08 DIAGNOSIS — I6529 Occlusion and stenosis of unspecified carotid artery: Secondary | ICD-10-CM | POA: Insufficient documentation

## 2022-11-08 DIAGNOSIS — Z8679 Personal history of other diseases of the circulatory system: Secondary | ICD-10-CM | POA: Insufficient documentation

## 2022-11-08 DIAGNOSIS — I5022 Chronic systolic (congestive) heart failure: Secondary | ICD-10-CM | POA: Diagnosis not present

## 2022-11-08 DIAGNOSIS — G8929 Other chronic pain: Secondary | ICD-10-CM | POA: Diagnosis not present

## 2022-11-08 LAB — BASIC METABOLIC PANEL
Anion gap: 7 (ref 5–15)
BUN: 13 mg/dL (ref 8–23)
CO2: 25 mmol/L (ref 22–32)
Calcium: 8.8 mg/dL — ABNORMAL LOW (ref 8.9–10.3)
Chloride: 102 mmol/L (ref 98–111)
Creatinine, Ser: 0.93 mg/dL (ref 0.61–1.24)
GFR, Estimated: >60 mL/min (ref >60)
Glucose, Bld: 105 mg/dL — ABNORMAL HIGH (ref 70–99)
Potassium: 3.6 mmol/L (ref 3.5–5.1)
Sodium: 134 mmol/L — ABNORMAL LOW (ref 135–145)

## 2022-11-08 LAB — ECHOCARDIOGRAM COMPLETE
Area-P 1/2: 2.27 cm2
Calc EF: 50.2 %
S' Lateral: 3.6 cm
Single Plane A2C EF: 56.8 %
Single Plane A4C EF: 45.9 %

## 2022-11-08 NOTE — Progress Notes (Signed)
  Echocardiogram 2D Echocardiogram has been performed.  Henry Neal M 11/08/2022, 9:00 AM

## 2022-11-08 NOTE — Progress Notes (Signed)
Patient ID: Henry Neal, male   DOB: 04-30-1927, 89 y.o.   MRN: [id number]     Advanced Heart Failure Clinic Note  PCP: Dr. Ashby Dawes Cardiology: Dr. Thresa Ross is a 89 y.o. male with history of CAD, chronic systolic CHF, LBBB, AAA s/p repair presents for followup of CHF and CAD.  He had a cardiac cath in 12/15, showing total occlusion of the LAD and 50-60% proximal RCA.  EF at that time was near-normal.  In 10/16, he developed dyspnea and epigastric pain.  He was noted to be in acute pulmonary edema and was diuresed.  Troponin was only mildly elevated at 0.47.  He ultimately underwent LHC showing hazy 80-90% RCA stenosis which was likely the culprit for his presentation.  Additionally, the LAD was only subtotally occluded on this cath.  He had DES to RCA.  Cardiac MRI was done, showing EF 22% with significant viability in LAD territory.  Therefore, CTO of LAD was opened with DES.  Echo in 2/17 showed EF persistently low at 15%.  He had St Jude CRT-P placement in 4/17. Echo in 1/19 showed EF up to 45-50% with mild MR.    Echo in 3/20 showed EF 45% with mild diffuse hypokinesis, mildly decreased RV systolic function. Echo in 3/21 showed EF 45-50%, mild RV dilation with normal systolic function. Echo in 10/22 showed EF 50-55%, normal RV, mild MR.    He was diagnosed with ankylosing spondylitis and is now on methotrexate.    Echo was done today and reviewed, EF 45-50%, mild RV dysfunction.   Chronic low back pain continues to be his main complaint.  He is still working as a Furniture conservator/restorer. He walks with a cane for balance.  No exertional dyspnea or chest pain.  He has a history of positional vertigo and will get a spinning sensation at times if he moves his head too fast. He has noted epigastric burning after meals, has history of GERD and takes Protonix.  Weight is stable.  He is interested in getting more exercise.   ECG (personally reviewed): NSR, BiV paced  Labs (10/16): hgb 9.7, K  4.6, creatinine 1.12 Labs (11/16): K 5.9, creatinine 1.5 Labs (12/16); K 4.2, creatinine 1.07 => 1.02, BNP 448 Labs (1/17): K 4.1, creatinine 1.13, BNP 423, LDL 40, HDL 47 Labs (4/17): K 3.9, creatinine 1.03, HCT 32.6 Labs (5/17): K 4.4, creatinine 1.09 Labs (1/18): LDL 37 Labs (01/25/2017): K 3.5 Creatinine 0.96  Labs (8/18): BNP 30, K 3.7, creatinine 1.0 Labs (11/18): LDL 40, HDL 47, K 3.5, creatinine 0.96  Labs (3/19): K 4, creatinine 0.97 Labs (11/19): K 3.9, creatinine 1.2, LDL 44 Labs (4/20): K 4.1, creatinine 1.11 Labs (10/20): LDL 46 Labs (11/20): K 4.4, creatinine 1.05 Labs (3/21): K 4.2, creatinine 0.92 Labs (7/21): LDL 58 Labs (8/21): K 4, creatinine 1.27 Labs (11/21): K 3.9, creatinine 0.88 Labs (9/22): K 3.6, creatinine 0.82, LDL 44 Labs (6/23): K 4.6, creatinine 0.86 Labs (9/23): K 4.5, creatinine 0.97, LDL 49, HDL 68  PMH: 1. CKD 2. AAA s/p repair, followed at [facility]. 3. Carotid stenosis: s/p right CEA.   Carotid dopplers (2/16) with right CEA stable, < 40% LICA stenosis.  Followed at [facility].  - Carotid dopplers (9/81): 1-91% LICA, patent right CEA.  4. Chronic LBBB 5. LAD: LHC (12/15) with totally occluded LAD, 50-60% pRCA => medically managed.  10/16 admitted with NSTEMI.  LHC with subtotalled LAD occlusion, 80-90% pRCA with thrombus => DES RCA initially.  Cardiac MRI was done showing viability in the LAD territory, so he had DES to the CTO LAD.   6. Chronic systolic CHF: Ischemic cardiomyopathy.  Echo (10/16) with EF 15% with wall motion abnormalities, moderately dilated RV with normal systolic function.  CMRI (10/16) with severe LV dilation, EF 22% with septal-lateral dyssynchrony, no LV thrombus, normal RV size/systolic function => significant viability noted in the LAD territory.  Echo (2/17): EF 15%, severe LV dilation, wall motion abnormalities noted, mild MR, mildly dilated RV with normal systolic function, PASP 55 mmHg.  St Jude CRT-P 4/17.  - Echo (1/18): EF 50-55%,  mild to moderate MR.  - Echo (1/19): EF 45-50%, mild LVH, inferior hypokinesis, mild MR, PASP 30 mmHg.  - Echo (3/20): EF 45%, diffuse hypokinesis, normal RV size with mildly decreased systolic function.  - Echo (3/21): EF 45-50%, mildly dilated RV with normal systolic function.  - Echo (10/22): EF 50-55%, normal RV, mild MR - Echo (1/24): EF 45-50%, mild RV dysfunction 7. ABIs (10/16) were normal.  8. Raynaud's syndrome.  9. Sciatica  SH: Lives alone, prior smoker, still working as a Furniture conservator/restorer.  FH: CAD  Review of systems complete and found to be negative unless listed in HPI.    Current Outpatient Medications  Medication Sig Dispense Refill   acetaminophen (TYLENOL) 500 MG tablet Take 1,000 mg by mouth daily as needed for mild pain or headache. Reported on 01/25/2016     atorvastatin (LIPITOR) 40 MG tablet Take 1 tablet (40 mg total) by mouth daily at 6 PM. 30 tablet 6   Calcium Carbonate-Vitamin D (CALCIUM 600 + D PO) Take 1 tablet by mouth 2 (two) times daily.     carvedilol (COREG) 12.5 MG tablet TAKE 1 TABLET(12.5 MG) BY MOUTH TWICE DAILY 60 tablet 11   clopidogrel (PLAVIX) 75 MG tablet TAKE 1 TABLET(75 MG) BY MOUTH DAILY 90 tablet 1   clotrimazole (LOTRIMIN) 1 % cream Apply 1 Application topically as needed.     ENTRESTO 24-26 MG TAKE 1 TABLET BY MOUTH TWICE DAILY 60 tablet 4   finasteride (PROSCAR) 5 MG tablet take 1 tablet by mouth once daily     fluticasone (FLONASE) 50 MCG/ACT nasal spray Place 1 spray into both nostrils daily as needed for allergies.      folic acid (FOLVITE) 1 MG tablet Take 1 mg by mouth daily.     furosemide (LASIX) 20 MG tablet TAKE 1 TABLET(20 MG) BY MOUTH EVERY OTHER DAY 15 tablet 6   gabapentin (NEURONTIN) 300 MG capsule Take 300 mg by mouth 2 (two) times daily.  0   meclizine (ANTIVERT) 25 MG tablet Take 1 tablet (25 mg total) by mouth 2 (two) times daily as needed for dizziness. 30 tablet 0   meloxicam (MOBIC) 15 MG tablet Take 15 mg by mouth daily  as needed for pain.  0   methotrexate (RHEUMATREX) 2.5 MG tablet Take 17.5 mg by mouth once a week. Caution:Chemotherapy. Protect from light.7 tablets     mometasone (ELOCON) 0.1 % cream Apply 1 application topically daily as needed (for irritation).      Multiple Vitamin (MULTIVITAMIN PO) Take 1 tablet by mouth daily. ALIVE men's vitamin     nitroGLYCERIN (NITROSTAT) 0.4 MG SL tablet PLACE 1 TABLET UNDER THE TONGUE IF NEEDED EVERY 5 MINUTES FOR CHEST PAIN FOR 3 DOSES IF NO RELIEF AFTER FIRST DOSE CALL PRESCRIBER OR 911 25 tablet 3   pantoprazole (PROTONIX) 40 MG tablet Take 40 mg by  mouth daily.     spironolactone (ALDACTONE) 25 MG tablet TAKE 1 TABLET(25 MG) BY MOUTH DAILY 90 tablet 3   sulfaSALAzine (AZULFIDINE) 500 MG tablet Take 1,500 mg by mouth 2 (two) times daily.     No current facility-administered medications for this encounter.   BP (!) 92/40   Pulse (!) 54   Wt 76.2 kg (168 lb)   SpO2 98%   BMI 24.11 kg/m   Wt Readings from Last 3 Encounters:  11/08/22 76.2 kg (168 lb)  07/12/22 74.8 kg (165 lb)  07/09/22 74.9 kg (165 lb 3.2 oz)    General: NAD Neck: No JVD, no thyromegaly or thyroid nodule.  Lungs: Clear to auscultation bilaterally with normal respiratory effort. CV: Nondisplaced PMI.  Heart regular S1/S2, no S3/S4, no murmur.  No peripheral edema.  No carotid bruit.  Normal pedal pulses.  Abdomen: Soft, nontender, no hepatosplenomegaly, no distention.  Skin: Intact without lesions or rashes.  Neurologic: Alert and oriented x 3.  Psych: Normal affect. Extremities: No clubbing or cyanosis.  HEENT: Normal.  MSK: Kyphotic  Assessment/Plan: 1. CAD: NSTEMI with DES to RCA (culprit vessel) and subsequent DES to CTO of LAD (cMRI showed viability in the LAD distribution) in 10/16. No chest pain.  - Continue Plavix, CBC today.  - atorvastatin 40 daily, good lipids in 9/23 2. Chronic HF with mid range EF: Ischemic cardiomyopathy. Echo in 3/21 showed EF 45-50%, improved  since getting CRT.  Echo in 10/22 showed EF 50-55%.  Echo today was reviewed and showed EF 45-50%.  St Jude CRT-D.  NYHA class II.  He is not volume overloaded on exam.   - Continue Lasix 20 mg qod. BMET today.  - Continue Coreg 12.5 mg bid  - Continue spironolactone 25 mg daily.   - Continue Entresto 24/26 mg BID.  - I will refer him to the [facility] program.   3. Carotid stenosis: Minimal carotid stenosis on 9/22 dopplers.   4. Ankylosing spondylitis: Chronic back pain. He is on MTX.    Followup 4 months with APP.   Loralie Champagne, MD  11/08/2022

## 2022-11-08 NOTE — Patient Instructions (Signed)
There has been no changes to your medications.  Labs done today, your results will be available in MyChart, we will contact you for abnormal readings.  You have been referred to the [facility] PREP class. They will call you to arrange your appointment.  Your physician recommends that you schedule a follow-up appointment in: 4 months  If you have any questions or concerns before your next appointment please send [place] a message through [employer] or call our office at [phone].    TO LEAVE A MESSAGE FOR THE NURSE SELECT OPTION 2, PLEASE LEAVE A MESSAGE INCLUDING: YOUR NAME DATE OF BIRTH CALL BACK NUMBER REASON FOR CALL**this is important as we prioritize the call backs  YOU WILL RECEIVE A CALL BACK THE SAME DAY AS LONG AS YOU CALL BEFORE 4:00 PM  At the [facility], you and your health needs are our priority. As part of our continuing mission to provide you with exceptional heart care, we have created designated Provider Care Teams. These Care Teams include your primary Cardiologist (physician) and Advanced Practice Providers (APPs- Physician Assistants and Nurse Practitioners) who all work together to provide you with the care you need, when you need it.   You may see any of the following providers on your designated Care Team at your next follow up: Dr Glori Bickers Dr Loralie Champagne Dr. Roxana Hires, NP Lyda Jester, [place] [facility] [place], [place] Forestine Na, NP Audry Riles, PharmD   Please be sure to bring in all your medications bottles to every appointment.

## 2022-11-14 ENCOUNTER — Telehealth: Payer: Self-pay | Admitting: *Deleted

## 2022-11-14 NOTE — Telephone Encounter (Signed)
Contacted regarding PREP Class referral. Interested in participating at the [facility]. Would like a morning class. Will call him back with class availability.

## 2022-11-16 ENCOUNTER — Telehealth: Payer: Self-pay

## 2022-11-16 NOTE — Telephone Encounter (Signed)
Called to discuss PREP class schedule at [facility] would like to attend the March 12 class, every T/Th 10-11:15; will contact end of February to confirm and set up assessment visit.

## 2022-11-27 ENCOUNTER — Telehealth (HOSPITAL_COMMUNITY): Payer: Self-pay

## 2022-11-27 NOTE — Telephone Encounter (Signed)
He wants to go ahead with referral to GI office. He states he discussed with you previously. Is this ok to go ahead and do for him?

## 2022-11-27 NOTE — Telephone Encounter (Signed)
Yes, may refer for reflux.

## 2022-11-28 ENCOUNTER — Other Ambulatory Visit (HOSPITAL_COMMUNITY): Payer: Self-pay

## 2022-11-28 DIAGNOSIS — K219 Gastro-esophageal reflux disease without esophagitis: Secondary | ICD-10-CM

## 2022-11-29 ENCOUNTER — Telehealth (HOSPITAL_COMMUNITY): Payer: Self-pay

## 2022-11-29 NOTE — Telephone Encounter (Signed)
error 

## 2022-11-29 NOTE — Telephone Encounter (Signed)
I spoke with patient and updated him. Referral sent

## 2022-12-07 ENCOUNTER — Ambulatory Visit: Payer: Medicare Other

## 2022-12-07 LAB — CUP PACEART REMOTE DEVICE CHECK
Battery Remaining Longevity: 34 mo
Battery Remaining Percentage: 31 %
Battery Voltage: 2.92 V
Brady Statistic AP VP Percent: 2.6 %
Brady Statistic AP VS Percent: 1 %
Brady Statistic AS VP Percent: 97 %
Brady Statistic AS VS Percent: 1 %
Brady Statistic RA Percent Paced: 2.4 %
Date Time Interrogation Session: 20240216020019
Implantable Lead Connection Status: 753985
Implantable Lead Connection Status: 753985
Implantable Lead Connection Status: 753985
Implantable Lead Implant Date: 20170406
Implantable Lead Implant Date: 20170406
Implantable Lead Implant Date: 20170406
Implantable Lead Location: 753858
Implantable Lead Location: 753859
Implantable Lead Location: 753860
Implantable Pulse Generator Implant Date: 20170406
Lead Channel Impedance Value: 440 Ohm
Lead Channel Impedance Value: 480 Ohm
Lead Channel Impedance Value: 760 Ohm
Lead Channel Pacing Threshold Amplitude: 0.5 V
Lead Channel Pacing Threshold Amplitude: 1.125 V
Lead Channel Pacing Threshold Amplitude: 1.25 V
Lead Channel Pacing Threshold Pulse Width: 0.4 ms
Lead Channel Pacing Threshold Pulse Width: 0.4 ms
Lead Channel Pacing Threshold Pulse Width: 0.4 ms
Lead Channel Sensing Intrinsic Amplitude: 1.8 mV
Lead Channel Sensing Intrinsic Amplitude: 12 mV
Lead Channel Setting Pacing Amplitude: 2 V
Lead Channel Setting Pacing Amplitude: 2.125
Lead Channel Setting Pacing Amplitude: 2.25 V
Lead Channel Setting Pacing Pulse Width: 0.4 ms
Lead Channel Setting Pacing Pulse Width: 0.4 ms
Lead Channel Setting Sensing Sensitivity: 2 mV
Pulse Gen Model: 3262
Pulse Gen Serial Number: 7866838

## 2022-12-24 ENCOUNTER — Telehealth: Payer: Self-pay

## 2022-12-24 NOTE — Telephone Encounter (Signed)
Called to confirm participation in next PREP class at [facility] on March 19; assessment visit scheduled for March 13 at 2pm

## 2022-12-25 DIAGNOSIS — H52203 Unspecified astigmatism, bilateral: Secondary | ICD-10-CM | POA: Diagnosis not present

## 2022-12-26 ENCOUNTER — Other Ambulatory Visit (HOSPITAL_COMMUNITY): Payer: Self-pay

## 2022-12-26 MED ORDER — CLOPIDOGREL BISULFATE 75 MG PO TABS: ORAL_TABLET | ORAL | 1 refills | Status: DC

## 2022-12-27 DIAGNOSIS — M8580 Other specified disorders of bone density and structure, unspecified site: Secondary | ICD-10-CM | POA: Diagnosis not present

## 2022-12-27 DIAGNOSIS — Z79899 Other long term (current) drug therapy: Secondary | ICD-10-CM | POA: Diagnosis not present

## 2022-12-27 DIAGNOSIS — I5022 Chronic systolic (congestive) heart failure: Secondary | ICD-10-CM | POA: Diagnosis not present

## 2022-12-27 DIAGNOSIS — M459 Ankylosing spondylitis of unspecified sites in spine: Secondary | ICD-10-CM | POA: Diagnosis not present

## 2022-12-28 ENCOUNTER — Ambulatory Visit (INDEPENDENT_AMBULATORY_CARE_PROVIDER_SITE_OTHER): Payer: Medicare Other | Admitting: Podiatry

## 2022-12-28 ENCOUNTER — Encounter: Payer: Self-pay | Admitting: Physician Assistant

## 2022-12-28 ENCOUNTER — Encounter: Payer: Self-pay | Admitting: Podiatry

## 2022-12-28 ENCOUNTER — Telehealth (HOSPITAL_COMMUNITY): Payer: Self-pay | Admitting: Cardiology

## 2022-12-28 DIAGNOSIS — B351 Tinea unguium: Secondary | ICD-10-CM

## 2022-12-28 DIAGNOSIS — M79676 Pain in unspecified toe(s): Secondary | ICD-10-CM

## 2022-12-28 DIAGNOSIS — I739 Peripheral vascular disease, unspecified: Secondary | ICD-10-CM

## 2022-12-28 DIAGNOSIS — D692 Other nonthrombocytopenic purpura: Secondary | ICD-10-CM | POA: Diagnosis not present

## 2022-12-28 DIAGNOSIS — M459 Ankylosing spondylitis of unspecified sites in spine: Secondary | ICD-10-CM | POA: Diagnosis not present

## 2022-12-28 DIAGNOSIS — I700 Atherosclerosis of aorta: Secondary | ICD-10-CM | POA: Diagnosis not present

## 2022-12-28 NOTE — Progress Notes (Signed)
This patient returns to my office for at risk foot care.  This patient requires this care by a professional since this patient will be at risk due to having renal insufficiency coagulation defect and PAD.   Patient is taking plavix.    This patient is unable to cut nails himself since the patient cannot reach his nails.These nails are painful walking and wearing shoes.  This patient presents for at risk foot care today.  General Appearance  Alert, conversant and in no acute stress.  Vascular  Dorsalis pedis and posterior tibial  pulses are weakly  palpable  bilaterally.  Capillary return is within normal limits  bilaterally. Temperature is within normal limits  bilaterally.  Neurologic  Senn-Weinstein monofilament wire test within normal limits  bilaterally. Muscle power within normal limits bilaterally.  Nails Thick disfigured discolored nails with subungual debris  from hallux to fifth toes bilaterally. No evidence of bacterial infection or drainage bilaterally.  Orthopedic  No limitations of motion  feet .  No crepitus or effusions noted.  No bony pathology or digital deformities noted.  Skin  normotropic skin with no porokeratosis noted bilaterally.  No signs of infections or ulcers noted.     Onychomycosis  Pain in right toes  Pain in left toes  Consent was obtained for treatment procedures.   Mechanical debridement of nails 1-5  bilaterally performed with a nail nipper.  Filed with dremel without incident.    Return office visit   10 weeks                   Told patient to return for periodic foot care and evaluation due to potential at risk complications.   Anton Cheramie DPM  

## 2022-12-28 NOTE — Telephone Encounter (Signed)
Triage walk in  Pt came to the office to check the status of GI referral Referral was placed 11/29/22, pt has not received a call for appt  Appt made for 4/10 @ 930 -unclear if pt has seen GI in the past and per office rep, if he has seen different GI in the past they will cancel this appt and defer pt back to previous provider   Appt information given to pt

## 2023-01-03 NOTE — Progress Notes (Signed)
[facility] PREP Evaluation  Patient Details  Name: Henry Neal MRN: [id number] Date of Birth: 1927/06/25 Age: 89 y.o. PCP: Merrilee Seashore, MD  Vitals:   01/03/23 1602  BP: 122/76  Pulse: 64  SpO2: 95%  Weight: 165 lb 8 oz (75.1 kg)     [facility] Eval - 01/03/23 1600       [facility] "PREP" Location   [facility] "PREP" Location [place]      Referral    Referring Provider Aundra Dubin    Reason for referral Heart Failure;High Cholesterol;Hypertension    Program Start Date 01/08/23      Measurement   Waist Circumference 41 inches    Hip Circumference 41 inches    Body fat 34.3 percent      Information for Trainer   Goals --   Increase cardio-vascular stamina   Current Exercise none    Orthopedic Concerns --   kyphosis, shoulder limitations, R knee   Pertinent Medical History --   HTN, CHF, PAD, CAD   Restrictions/Precautions Fall risk;Assistive device      Timed Up and Go (TUGS)   Timed Up and Go High risk >13 seconds            Past Medical History:  Diagnosis Date   AAA (abdominal aortic aneurysm) (HCC)    Abnormal stress test    Allergic rhinitis    uses Flonase daily   Anginal pain (HCC)    pain in neck,shoulders,down arms occ   Arthritis    Cancer (HCC)    skin   Carotid artery occlusion    Chronic venous insufficiency    Coronary artery disease    Dizziness    takes Antivert daily as needed   ED (erectile dysfunction)    GERD (gastroesophageal reflux disease)    takes Omeprazole daily   Hiatal hernia    Hx of echocardiogram 04/04/2010   showed a borderline dilatd left ventricle with mild left ventricle hypertrophy and an EF 50-55% doppler suggested diastolic dysfunction, although the pattern is probably normal for an 88 yrs old. The left atrium was indeed mildly dilated at 42 mm, but there are no significant valvular abnormalities.    Hyperlipidemia    Hypertension    takes Proscar,Dyazide,Avapro,and Metoprolol daily   Myocardial infarction [facility]) Oct.  11, 2016   Heart Attack   Palpitation    Shortness of breath    Sigmoid diverticulosis    T12 compression fracture [facility])    Thyroid disease    Past Surgical History:  Procedure Laterality Date   ABDOMINAL AORTIC ANEURYSM REPAIR  02/24/2009   AAA Stenting   CARDIAC CATHETERIZATION N/A 08/04/2015   Procedure: Right/Left Heart Cath and Coronary Angiography;  Surgeon: Larey Dresser, MD;  Location: [place] [facility];  Service: Cardiovascular;  Laterality: N/A;   CARDIAC CATHETERIZATION N/A 08/04/2015   Procedure: Coronary Stent Intervention;  Surgeon: Burnell Blanks, MD;  Location: [place] [facility];  Service: Cardiovascular;  Laterality: N/A;   CARDIAC CATHETERIZATION N/A 08/08/2015   Procedure: Coronary Stent Intervention;  Surgeon: Peter M Martinique, MD;  Location: [place] [facility];  Service: Cardiovascular;  Laterality: N/A;   ENDARTERECTOMY Right 10/27/2013   Procedure: ENDARTERECTOMY CAROTID;  Surgeon: Elam Dutch, MD;  Location: [place];  Service: Vascular;  Laterality: Right;   EP IMPLANTABLE DEVICE N/A 01/26/2016   Procedure: BiV Pacemaker Insertion CRT-P;  Surgeon: Evans Lance, MD;  Location: [place] [facility];  Service: Cardiovascular;  Laterality: N/A;  HAND SURGERY Right    tendon, lft 90's   HERNIA REPAIR Bilateral    LEFT HEART CATHETERIZATION WITH CORONARY ANGIOGRAM N/A 10/12/2013   Procedure: LEFT HEART CATHETERIZATION WITH CORONARY ANGIOGRAM;  Surgeon: Lorretta Harp, MD;  Location: [facility] CATH LAB;  Service: Cardiovascular;  Laterality: N/A;   melanoma removed     ROTATOR CUFF REPAIR  2003   left arm   Social History   Tobacco Use  Smoking Status Former   Packs/day: 2.00   Years: 30.00   Additional pack years: 0.00   Total pack years: 60.00   Types: Cigarettes   Quit date: 10/22/1977   Years since quitting: 45.2   Passive exposure: Never  Smokeless Tobacco Former   Quit date: 10/22/1984  To begin PREP at [employer] 01/08/23, every T/Th 10-11:15; 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

## 2023-01-04 DIAGNOSIS — I700 Atherosclerosis of aorta: Secondary | ICD-10-CM | POA: Diagnosis not present

## 2023-01-04 DIAGNOSIS — Z0000 Encounter for general adult medical examination without abnormal findings: Secondary | ICD-10-CM | POA: Diagnosis not present

## 2023-01-04 DIAGNOSIS — D692 Other nonthrombocytopenic purpura: Secondary | ICD-10-CM | POA: Diagnosis not present

## 2023-01-04 DIAGNOSIS — I739 Peripheral vascular disease, unspecified: Secondary | ICD-10-CM | POA: Diagnosis not present

## 2023-01-04 DIAGNOSIS — M459 Ankylosing spondylitis of unspecified sites in spine: Secondary | ICD-10-CM | POA: Diagnosis not present

## 2023-01-08 NOTE — Progress Notes (Signed)
[facility] PREP Weekly Session  Patient Details  Name: Henry Neal MRN: [id number] Date of Birth: Apr 14, 1927 Age: 89 y.o. PCP: Merrilee Seashore, MD  There were no vitals filed for this visit.   [facility] Weekly seesion - 01/08/23 1100       [facility] "PREP" Location   [facility] "PREP" Location [facility]      Weekly Session   Topic Discussed Goal setting and welcome to the program   Introductions, review of notebook, tour of facilty, option to work out on cardio machine   Classes attended to date [place] 01/08/2023, 11:50 AM

## 2023-01-15 NOTE — Progress Notes (Signed)
[facility] PREP Weekly Session  Patient Details  Name: Henry Neal MRN: [id number] Date of Birth: 24-Jun-1927 Age: 89 y.o. PCP: Merrilee Seashore, MD  Vitals:   01/15/23 1148  Weight: 160 lb (72.6 kg)     [facility] Weekly seesion - 01/15/23 1100       [facility] "PREP" Location   [facility] "PREP" Location [facility]      Weekly Session   Topic Discussed Importance of resistance training;Other ways to be active   Sitting no more than 30 minutes: cardio work up to 150 min/wk; strength training 2-3 times a week for 20-40 minutes   Classes attended to date [place] 01/15/2023, 11:49 AM

## 2023-01-22 NOTE — Progress Notes (Signed)
[facility] PREP Weekly Session  Patient Details  Name: Henry Neal MRN: [id number] Date of Birth: 04-07-1927 Age: 89 y.o. PCP: Merrilee Seashore, MD  Vitals:   01/22/23 1241  Weight: 160 lb (72.6 kg)     [facility] Weekly seesion - 01/22/23 1200       [facility] "PREP" Location   [facility] "PREP" Location [facility]      Weekly Session   Topic Discussed Healthy eating tips   Foods to reduce, foods to increase; introduced [employer] app   Classes attended to date [place] 01/22/2023, 12:41 PM

## 2023-01-25 ENCOUNTER — Emergency Department (HOSPITAL_COMMUNITY)
Admission: EM | Admit: 2023-01-25 | Discharge: 2023-01-25 | Disposition: A | Discharge status: Home / Self Care | Payer: Medicare Other | Attending: Emergency Medicine | Admitting: Emergency Medicine

## 2023-01-25 ENCOUNTER — Emergency Department (HOSPITAL_COMMUNITY): Payer: Medicare Other

## 2023-01-25 DIAGNOSIS — Y92410 Unspecified street and highway as the place of occurrence of the external cause: Secondary | ICD-10-CM | POA: Insufficient documentation

## 2023-01-25 DIAGNOSIS — I2510 Atherosclerotic heart disease of native coronary artery without angina pectoris: Secondary | ICD-10-CM | POA: Insufficient documentation

## 2023-01-25 DIAGNOSIS — K5730 Diverticulosis of large intestine without perforation or abscess without bleeding: Secondary | ICD-10-CM | POA: Diagnosis not present

## 2023-01-25 DIAGNOSIS — R0789 Other chest pain: Secondary | ICD-10-CM | POA: Diagnosis not present

## 2023-01-25 DIAGNOSIS — R0781 Pleurodynia: Secondary | ICD-10-CM | POA: Diagnosis not present

## 2023-01-25 DIAGNOSIS — G4489 Other headache syndrome: Secondary | ICD-10-CM | POA: Diagnosis not present

## 2023-01-25 DIAGNOSIS — Z041 Encounter for examination and observation following transport accident: Secondary | ICD-10-CM | POA: Diagnosis not present

## 2023-01-25 DIAGNOSIS — R42 Dizziness and giddiness: Secondary | ICD-10-CM | POA: Diagnosis not present

## 2023-01-25 DIAGNOSIS — K449 Diaphragmatic hernia without obstruction or gangrene: Secondary | ICD-10-CM | POA: Diagnosis not present

## 2023-01-25 DIAGNOSIS — Z7902 Long term (current) use of antithrombotics/antiplatelets: Secondary | ICD-10-CM | POA: Diagnosis not present

## 2023-01-25 DIAGNOSIS — R079 Chest pain, unspecified: Secondary | ICD-10-CM | POA: Diagnosis not present

## 2023-01-25 LAB — CBC
HCT: 37 % — ABNORMAL LOW (ref 39.0–52.0)
Hemoglobin: 12.4 g/dL — ABNORMAL LOW (ref 13.0–17.0)
MCH: 31.6 pg (ref 26.0–34.0)
MCHC: 33.5 g/dL (ref 30.0–36.0)
MCV: 94.4 fL (ref 80.0–100.0)
Platelets: 133 10*3/uL — ABNORMAL LOW (ref 150–400)
RBC: 3.92 MIL/uL — ABNORMAL LOW (ref 4.22–5.81)
RDW: 13.4 % (ref 11.5–15.5)
WBC: 4.2 10*3/uL (ref 4.0–10.5)
nRBC: 0 % (ref 0.0–0.2)

## 2023-01-25 LAB — I-STAT CHEM 8, ED
BUN: 15 mg/dL (ref 8–23)
Calcium, Ion: 1.18 mmol/L (ref 1.15–1.40)
Chloride: 102 mmol/L (ref 98–111)
Creatinine, Ser: 0.8 mg/dL (ref 0.61–1.24)
Glucose, Bld: 91 mg/dL (ref 70–99)
HCT: 37 % — ABNORMAL LOW (ref 39.0–52.0)
Hemoglobin: 12.6 g/dL — ABNORMAL LOW (ref 13.0–17.0)
Potassium: 3.9 mmol/L (ref 3.5–5.1)
Sodium: 136 mmol/L (ref 135–145)
TCO2: 26 mmol/L (ref 22–32)

## 2023-01-25 MED ORDER — IOHEXOL 350 MG/ML SOLN
75.0000 mL | Freq: Once | INTRAVENOUS | Status: AC | PRN
  Administered 2023-01-25: 75 mL via INTRAVENOUS

## 2023-01-25 MED ORDER — ACETAMINOPHEN 325 MG PO TABS
650.0000 mg | ORAL_TABLET | Freq: Once | ORAL | Status: AC
  Administered 2023-01-25: 650 mg via ORAL
  Filled 2023-01-25: qty 2
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
## 2023-01-25 NOTE — Discharge Instructions (Signed)
The x-rays and CT scans did not show any signs of serious injury.  Expect to be stiff and sore for the next several days.  Take Tylenol as needed for pain and discomfort.  Return to the emergency room if you start having severe pain shortness of breath or other concerning symptoms.
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
## 2023-01-25 NOTE — Progress Notes (Unsigned)
01/25/2023 Henry Neal [id number] 1926/10/27  Referring provider: Georgianne Fick, MD Primary GI doctor: {acdocs:[id number]}  ASSESSMENT AND PLAN:   There are no diagnoses linked to this encounter.   Patient Care Team: Georgianne Fick, MD as PCP - General (Internal Medicine) Thurmon Fair, MD as Attending Physician (Cardiology) Laurey Morale, MD as Consulting Physician (Cardiology) Cindee Salt, MD as Consulting Physician (Orthopedic Surgery) Marinus Maw, MD as Consulting Physician (Cardiology) Helane Gunther, DPM as Consulting Physician (Podiatry) Stacey Drain, MD as Consulting Physician (Rheumatology)  HISTORY OF PRESENT ILLNESS: 89 y.o. male with a past medical history of CAD PAD status post CEA hypertension dyslipidemia AAA status post stent 2010, GERD, systolic heart failure echo 11/08/2022 EF 45 to 50% mild MR no aortic stenosis and others listed below presents for evaluation of GERD.  02/10/2019 CT abdomen pelvis secondary to fall shows small hiatal hernia, diverticulosis, prior aortic stent graft repair of AAA, small bilateral renal cyst Labs reviewed from 07/09/2022 show normocytic anemia Hgb 12.6, MCV 98, thrombocytopenia platelets 144, no leukocytosis. He  reports that he quit smoking about 45 years ago. His smoking use included cigarettes. He has a 60.00 pack-year smoking history. He has never been exposed to tobacco smoke. He quit smokeless tobacco use about 38 years ago. He reports current alcohol use of about 7.0 standard drinks of alcohol per week. He reports that he does not use drugs.  RELEVANT LABS AND IMAGING: CBC    Component Value Date/Time   WBC 5.0 07/09/2022 0958   RBC 3.92 (L) 07/09/2022 0958   HGB 12.6 (L) 07/09/2022 0958   HCT 38.4 (L) 07/09/2022 0958   PLT 144 (L) 07/09/2022 0958   MCV 98.0 07/09/2022 0958   MCH 32.1 07/09/2022 0958   MCHC 32.8 07/09/2022 0958   RDW 14.9 07/09/2022 0958   LYMPHSABS 1.8 08/16/2015 0138   MONOABS  0.7 08/16/2015 0138   EOSABS 0.2 08/16/2015 0138   BASOSABS 0.0 08/16/2015 0138   Recent Labs    07/09/22 0958  HGB 12.6*     CMP     Component Value Date/Time   NA 134 (L) 11/08/2022 1105   K 3.6 11/08/2022 1105   CL 102 11/08/2022 1105   CO2 25 11/08/2022 1105   GLUCOSE 105 (H) 11/08/2022 1105   BUN 13 11/08/2022 1105   CREATININE 0.93 11/08/2022 1105   CREATININE 1.11 10/09/2013 1307   CALCIUM 8.8 (L) 11/08/2022 1105   PROT 7.3 10/23/2013 1127   ALBUMIN 4.0 10/23/2013 1127   AST 25 10/23/2013 1127   ALT 27 10/23/2013 1127   ALKPHOS 70 10/23/2013 1127   BILITOT 0.4 10/23/2013 1127   GFRNONAA >60 11/08/2022 1105   GFRAA 57 (L) 05/30/2020 1144      Latest Ref Rng & Units 10/23/2013   11:27 AM 04/15/2009    2:00 PM 02/22/2009    2:04 PM  Hepatic Function  Total Protein 6.0 - 8.3 g/dL 7.3  7.6  7.0   Albumin 3.5 - 5.2 g/dL 4.0  3.4  3.9   AST 0 - 37 U/L 25  42  31   ALT 0 - 53 U/L 27  59  27   Alk Phosphatase 39 - 117 U/L 70  95  64   Total Bilirubin 0.3 - 1.2 mg/dL 0.4  0.5  0.8   Bilirubin, Direct 0.0 - 0.3 mg/dL   0.3       Current Medications:    Current Outpatient Medications (Cardiovascular):  atorvastatin (LIPITOR) 40 MG tablet, Take 1 tablet (40 mg total) by mouth daily at 6 PM.   carvedilol (COREG) 12.5 MG tablet, TAKE 1 TABLET(12.5 MG) BY MOUTH TWICE DAILY   ENTRESTO 24-26 MG, TAKE 1 TABLET BY MOUTH TWICE DAILY   furosemide (LASIX) 20 MG tablet, TAKE 1 TABLET(20 MG) BY MOUTH EVERY OTHER DAY   nitroGLYCERIN (NITROSTAT) 0.4 MG SL tablet, PLACE 1 TABLET UNDER THE TONGUE IF NEEDED EVERY 5 MINUTES FOR CHEST PAIN FOR 3 DOSES IF NO RELIEF AFTER FIRST DOSE CALL PRESCRIBER OR 911   spironolactone (ALDACTONE) 25 MG tablet, TAKE 1 TABLET(25 MG) BY MOUTH DAILY  Current Outpatient Medications (Respiratory):    fluticasone (FLONASE) 50 MCG/ACT nasal spray, Place 1 spray into both nostrils daily as needed for allergies.   Current Outpatient Medications  (Analgesics):    acetaminophen (TYLENOL) 500 MG tablet, Take 1,000 mg by mouth daily as needed for mild pain or headache. Reported on 01/25/2016   meloxicam (MOBIC) 15 MG tablet, Take 15 mg by mouth daily as needed for pain.  Current Outpatient Medications (Hematological):    clopidogrel (PLAVIX) 75 MG tablet, TAKE 1 TABLET(75 MG) BY MOUTH DAILY   folic acid (FOLVITE) 1 MG tablet, Take 1 mg by mouth daily.  Current Outpatient Medications (Other):    Calcium Carbonate-Vitamin D (CALCIUM 600 + D PO), Take 1 tablet by mouth 2 (two) times daily.   clotrimazole (LOTRIMIN) 1 % cream, Apply 1 Application topically as needed.   finasteride (PROSCAR) 5 MG tablet, take 1 tablet by mouth once daily   gabapentin (NEURONTIN) 300 MG capsule, Take 300 mg by mouth 2 (two) times daily.   meclizine (ANTIVERT) 25 MG tablet, Take 1 tablet (25 mg total) by mouth 2 (two) times daily as needed for dizziness.   methotrexate (RHEUMATREX) 2.5 MG tablet, Take 17.5 mg by mouth once a week. Caution:Chemotherapy. Protect from light.7 tablets   mometasone (ELOCON) 0.1 % cream, Apply 1 application topically daily as needed (for irritation).    Multiple Vitamin (MULTIVITAMIN PO), Take 1 tablet by mouth daily. ALIVE men's vitamin   pantoprazole (PROTONIX) 40 MG tablet, Take 40 mg by mouth daily.   sulfaSALAzine (AZULFIDINE) 500 MG tablet, Take 1,500 mg by mouth 2 (two) times daily.  Medical History:  Past Medical History:  Diagnosis Date   AAA (abdominal aortic aneurysm) (HCC)    Abnormal stress test    Allergic rhinitis    uses Flonase daily   Anginal pain (HCC)    pain in neck,shoulders,down arms occ   Arthritis    Cancer (HCC)    skin   Carotid artery occlusion    Chronic venous insufficiency    Coronary artery disease    Dizziness    takes Antivert daily as needed   ED (erectile dysfunction)    GERD (gastroesophageal reflux disease)    takes Omeprazole daily   Hiatal hernia    Hx of echocardiogram  04/04/2010   showed a borderline dilatd left ventricle with mild left ventricle hypertrophy and an EF 50-55% doppler suggested diastolic dysfunction, although the pattern is probably normal for an 89 yrs old. The left atrium was indeed mildly dilated at 42 mm, but there are no significant valvular abnormalities.    Hyperlipidemia    Hypertension    takes Proscar,Dyazide,Avapro,and Metoprolol daily   Myocardial infarction [facility](HCC) Oct. 11, 2016   Heart Attack   Palpitation    Shortness of breath    Sigmoid diverticulosis    T12  compression fracture (HCC)    Thyroid disease    Allergies:  Allergies  Allergen Reactions   Doxepin Other (See Comments)    hallucinations  hallucinations Hallucinations  hallucinations Hallucinations hallucinations Hallucinations   Sinequan [Doxepin Hcl] Other (See Comments)    hallucinations   Meloxicam Other (See Comments)   Verapamil Other (See Comments)    Caused heart to race Caused heart to race  Caused heart to race Caused heart to race   Procaine Itching and Other (See Comments)    Tingling all over as soon as it was injected  Tingling all over as soon as it was injected Tingling all over as soon as it was injected     Surgical History:  He  has a past surgical history that includes Rotator cuff repair (2003); Abdominal aortic aneurysm repair (02/24/2009); Hernia repair (Bilateral); Hand surgery (Right); Endarterectomy (Right, 10/27/2013); melanoma removed; left heart catheterization with coronary angiogram (N/A, 10/12/2013); Cardiac catheterization (N/A, 08/04/2015); Cardiac catheterization (N/A, 08/04/2015); Cardiac catheterization (N/A, 08/08/2015); and Cardiac catheterization (N/A, 01/26/2016). Family History:  His family history includes COPD in his mother; Diabetes in his mother; Heart disease in his mother; Hyperlipidemia in his mother; Hypertension in his mother.  REVIEW OF SYSTEMS  : All other systems reviewed and negative except where  noted in the History of Present Illness.  PHYSICAL EXAM: There were no vitals taken for this visit. General Appearance: Well nourished, in no apparent distress. Head:   Normocephalic and atraumatic. Eyes:  sclerae anicteric,conjunctive pink  Respiratory: Respiratory effort normal, BS equal bilaterally without rales, rhonchi, wheezing. Cardio: RRR with no MRGs. Peripheral pulses intact.  Abdomen: Soft,  {BlankSingle:19197::"Flat","Obese","Non-distended"} ,active bowel sounds. {actendernessAB:27319} tenderness {anatomy; site abdomen:5010}. {BlankMultiple:19196::"Without guarding","With guarding","Without rebound","With rebound"}. No masses. Rectal: {acrectalexam:27461} Musculoskeletal: Full ROM, {PSY - GAIT AND STATION:22860} gait. {With/Without:[id number]} edema. Skin:  Dry and intact without significant lesions or rashes Neuro: Alert and  oriented x4;  No focal deficits. Psych:  Cooperative. Normal mood and affect.    Doree Albee, PA-C 8:20 AM

## 2023-01-29 ENCOUNTER — Telehealth: Payer: Self-pay
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
## 2023-01-30 ENCOUNTER — Other Ambulatory Visit (INDEPENDENT_AMBULATORY_CARE_PROVIDER_SITE_OTHER): Payer: Medicare Other

## 2023-01-30 ENCOUNTER — Ambulatory Visit: Payer: Medicare Other | Admitting: Physician Assistant

## 2023-01-30 ENCOUNTER — Encounter: Payer: Self-pay | Admitting: Physician Assistant

## 2023-01-30 VITALS — BP 110/60 | HR 63 | Ht 70.0 in | Wt 164.8 lb

## 2023-01-30 DIAGNOSIS — Z86010 Personal history of colonic polyps: Secondary | ICD-10-CM

## 2023-01-30 DIAGNOSIS — D649 Anemia, unspecified: Secondary | ICD-10-CM | POA: Diagnosis not present

## 2023-01-30 DIAGNOSIS — K219 Gastro-esophageal reflux disease without esophagitis: Secondary | ICD-10-CM | POA: Diagnosis not present

## 2023-01-30 DIAGNOSIS — R1314 Dysphagia, pharyngoesophageal phase: Secondary | ICD-10-CM | POA: Diagnosis not present

## 2023-01-30 DIAGNOSIS — I739 Peripheral vascular disease, unspecified: Secondary | ICD-10-CM

## 2023-01-30 DIAGNOSIS — I5022 Chronic systolic (congestive) heart failure: Secondary | ICD-10-CM

## 2023-01-30 DIAGNOSIS — M459 Ankylosing spondylitis of unspecified sites in spine: Secondary | ICD-10-CM

## 2023-01-30 LAB — CBC WITH DIFFERENTIAL/PLATELET
Basophils Absolute: 0 10*3/uL (ref 0.0–0.1)
Basophils Relative: 0.4 % (ref 0.0–3.0)
Eosinophils Absolute: 0.1 10*3/uL (ref 0.0–0.7)
Eosinophils Relative: 2.1 % (ref 0.0–5.0)
HCT: 36.2 % — ABNORMAL LOW (ref 39.0–52.0)
Hemoglobin: 12.3 g/dL — ABNORMAL LOW (ref 13.0–17.0)
Lymphocytes Relative: 27.3 % (ref 12.0–46.0)
Lymphs Abs: 1.5 10*3/uL (ref 0.7–4.0)
MCHC: 33.9 g/dL (ref 30.0–36.0)
MCV: 95.1 fl (ref 78.0–100.0)
Monocytes Absolute: 0.5 10*3/uL (ref 0.1–1.0)
Monocytes Relative: 9.2 % (ref 3.0–12.0)
Neutro Abs: 3.3 10*3/uL (ref 1.4–7.7)
Neutrophils Relative %: 61 % (ref 43.0–77.0)
Platelets: 155 10*3/uL (ref 150.0–400.0)
RBC: 3.81 Mil/uL — ABNORMAL LOW (ref 4.22–5.81)
RDW: 15.1 % (ref 11.5–15.5)
WBC: 5.4 10*3/uL (ref 4.0–10.5)

## 2023-01-30 LAB — IBC + FERRITIN
Ferritin: 7.1 ng/mL — ABNORMAL LOW (ref 22.0–322.0)
Iron: 53 ug/dL (ref 42–165)
Saturation Ratios: 16.1 % — ABNORMAL LOW (ref 20.0–50.0)
TIBC: 329 ug/dL (ref 250.0–450.0)
Transferrin: 235 mg/dL (ref 212.0–360.0)

## 2023-01-30 LAB — VITAMIN B12: Vitamin B-12: 492 pg/mL (ref 211–911)

## 2023-01-30 LAB — FOLATE: Folate: 22.5 ng/mL (ref >5.9)

## 2023-01-30 NOTE — Patient Instructions (Signed)
Your provider has requested that you go to the basement level for lab work before leaving today. Press "B" on the elevator. The lab is located at the first door on the left as you exit the elevator.   Dysphagia precautions:  1. Take reflux medications 30+ minutes before food in the morning 2. Begin meals with warm beverage 3. Eat smaller more frequent meals 4. Eat slowly, taking small bites and sips 5. Alternate solids and liquids 6. Avoid foods/liquids that increase acid production 7. Sit upright during and for 30+ minutes after meals to facilitate esophageal clearing 8. All meats should be chopped finely.   If something gets hung in your esophagus and will not come up or go down, proceed to the emergency room.    You have been scheduled for a Barium Esophogram at [facility] (1st floor of the hospital) on 02/06/2023 at 11:00am. Please arrive 30 minutes prior to your appointment for registration. Make certain not to have anything to eat or drink 3 hours prior to your test. If you need to reschedule for any reason, please contact radiology at [phone] to do so. __________________________________________________________________ A barium swallow is an examination that concentrates on views of the esophagus. This tends to be a double contrast exam (barium and two liquids which, when combined, create a gas to distend the wall of the oesophagus) or single contrast (non-ionic iodine based). The study is usually tailored to your symptoms so a good history is essential. Attention is paid during the study to the form, structure and configuration of the esophagus, looking for functional disorders (such as aspiration, dysphagia, achalasia, motility and reflux) EXAMINATION You may be asked to change into a gown, depending on the type of swallow being performed. A radiologist and radiographer will perform the procedure. The radiologist will advise you of the type of contrast selected for your  procedure and direct you during the exam. You will be asked to stand, sit or lie in several different positions and to hold a small amount of fluid in your mouth before being asked to swallow while the imaging is performed .In some instances you may be asked to swallow barium coated marshmallows to assess the motility of a solid food bolus. The exam can be recorded as a digital or video fluoroscopy procedure. POST PROCEDURE It will take 1-2 days for the barium to pass through your system. To facilitate this, it is important, unless otherwise directed, to increase your fluids for the next 24-48hrs and to resume your normal diet.  This test typically takes about 30 minutes to perform. __________________________________________________________________________________

## 2023-01-31 ENCOUNTER — Other Ambulatory Visit: Payer: Self-pay

## 2023-01-31 DIAGNOSIS — M459 Ankylosing spondylitis of unspecified sites in spine: Secondary | ICD-10-CM | POA: Diagnosis not present

## 2023-01-31 DIAGNOSIS — N182 Chronic kidney disease, stage 2 (mild): Secondary | ICD-10-CM | POA: Diagnosis not present

## 2023-01-31 DIAGNOSIS — I739 Peripheral vascular disease, unspecified: Secondary | ICD-10-CM | POA: Diagnosis not present

## 2023-01-31 DIAGNOSIS — I5022 Chronic systolic (congestive) heart failure: Secondary | ICD-10-CM | POA: Diagnosis not present

## 2023-01-31 DIAGNOSIS — M791 Myalgia, unspecified site: Secondary | ICD-10-CM | POA: Diagnosis not present

## 2023-01-31 DIAGNOSIS — E782 Mixed hyperlipidemia: Secondary | ICD-10-CM | POA: Diagnosis not present

## 2023-01-31 DIAGNOSIS — D649 Anemia, unspecified: Secondary | ICD-10-CM

## 2023-01-31 MED ORDER — FERROUS SULFATE 325 (65 FE) MG PO TABS: 325.0000 mg | ORAL_TABLET | Freq: Every day | ORAL | 3 refills | Status: DC

## 2023-01-31 NOTE — Progress Notes (Signed)
I agree with the assessment and plan as outlined by Ms. Collier. 

## 2023-02-02 ENCOUNTER — Other Ambulatory Visit: Payer: Self-pay | Admitting: Internal Medicine

## 2023-02-05 ENCOUNTER — Other Ambulatory Visit: Payer: Self-pay | Admitting: Internal Medicine

## 2023-02-05 ENCOUNTER — Ambulatory Visit: Payer: Medicare Other | Attending: Internal Medicine | Admitting: Internal Medicine

## 2023-02-05 ENCOUNTER — Encounter: Payer: Self-pay | Admitting: Internal Medicine

## 2023-02-05 VITALS — BP 114/48 | HR 60 | Ht 70.0 in | Wt 161.4 lb

## 2023-02-05 DIAGNOSIS — Z950 Presence of cardiac pacemaker: Secondary | ICD-10-CM | POA: Diagnosis not present

## 2023-02-05 DIAGNOSIS — I2510 Atherosclerotic heart disease of native coronary artery without angina pectoris: Secondary | ICD-10-CM | POA: Diagnosis not present

## 2023-02-05 DIAGNOSIS — I5022 Chronic systolic (congestive) heart failure: Secondary | ICD-10-CM | POA: Diagnosis not present

## 2023-02-05 NOTE — Patient Instructions (Signed)
Medication Instructions:  Your physician recommends that you continue on your current medications as directed. Please refer to the Current Medication list given to you today.  *If you need a refill on your cardiac medications before your next appointment, please call your pharmacy*  Lab Work: None ordered.  If you have labs (blood work) drawn today and your tests are completely normal, you will receive your results only by: MyChart Message (if you have MyChart) OR A paper copy in the mail If you have any lab test that is abnormal or we need to change your treatment, we will call you to review the results.  Testing/Procedures: None ordered.  Follow-Up: At [facility], you and your health needs are our priority.  As part of our continuing mission to provide you with exceptional heart care, we have created designated Provider Care Teams.  These Care Teams include your primary Cardiologist (physician) and Advanced Practice Providers (APPs -  Physician Assistants and Nurse Practitioners) who all work together to provide you with the care you need, when you need it.  Your next appointment:   1 year(s)  The format for your next appointment:   In Person  Provider:   Lewayne Bunting, MD{or one of the following Advanced Practice Providers on your designated Care Team:   Francis Dowse, [place] Casimiro Needle "Mardelle Matte" Lanna Poche, [place]  Remote monitoring is used to monitor your Pacemaker from home. This monitoring reduces the number of office visits required to check your device to one time per year. It allows [place] to keep an eye on the functioning of your device to ensure it is working properly. You are scheduled for a device check from home on 03/08/23. You may send your transmission at any time that day. If you have a wireless device, the transmission will be sent automatically. After your physician reviews your transmission, you will receive a postcard with your next transmission date.     \

## 2023-02-05 NOTE — Progress Notes (Signed)
HPI Henry Neal returns today for followup. He is a pleasant 89 yo man with a h/o chronic systolic heart failure and LBBB who is s/p biv PPM insertion. In the interim, he has done well with no chest pain or sob. No syncope. His EF improved from 15% to 50% with biv pacing. He feels well. He is still working part time. No leg swelling.  Allergies  Allergen Reactions   Doxepin Other (See Comments)    hallucinations  hallucinations Hallucinations  hallucinations Hallucinations hallucinations Hallucinations   Sinequan [Doxepin Hcl] Other (See Comments)    hallucinations   Meloxicam Other (See Comments)   Verapamil Other (See Comments)    Caused heart to race Caused heart to race  Caused heart to race Caused heart to race   Procaine Itching and Other (See Comments)    Tingling all over as soon as it was injected  Tingling all over as soon as it was injected Tingling all over as soon as it was injected     Current Outpatient Medications  Medication Sig Dispense Refill   acetaminophen (TYLENOL) 500 MG tablet Take 1,000 mg by mouth daily as needed for mild pain or headache. Reported on 01/25/2016     atorvastatin (LIPITOR) 40 MG tablet Take 1 tablet (40 mg total) by mouth daily at 6 PM. 30 tablet 6   Calcium Carbonate-Vitamin D (CALCIUM 600 + D PO) Take 1 tablet by mouth 2 (two) times daily.     carvedilol (COREG) 12.5 MG tablet TAKE 1 TABLET(12.5 MG) BY MOUTH TWICE DAILY 60 tablet 11   clopidogrel (PLAVIX) 75 MG tablet TAKE 1 TABLET(75 MG) BY MOUTH DAILY 90 tablet 1   clotrimazole (LOTRIMIN) 1 % cream Apply 1 Application topically as needed.     ENTRESTO 24-26 MG TAKE 1 TABLET BY MOUTH TWICE DAILY 60 tablet 4   ferrous sulfate 325 (65 FE) MG tablet Take 1 tablet (325 mg total) by mouth daily with breakfast. 30 tablet 3   finasteride (PROSCAR) 5 MG tablet take 1 tablet by mouth once daily     fluticasone (FLONASE) 50 MCG/ACT nasal spray Place 1 spray into both nostrils daily  as needed for allergies.      folic acid (FOLVITE) 1 MG tablet Take 1 mg by mouth daily.     furosemide (LASIX) 20 MG tablet TAKE 1 TABLET(20 MG) BY MOUTH EVERY OTHER DAY 15 tablet 6   gabapentin (NEURONTIN) 300 MG capsule Take 300 mg by mouth 2 (two) times daily.  0   meclizine (ANTIVERT) 25 MG tablet Take 1 tablet (25 mg total) by mouth 2 (two) times daily as needed for dizziness. 30 tablet 0   meloxicam (MOBIC) 15 MG tablet Take 15 mg by mouth daily as needed for pain.  0   methotrexate (RHEUMATREX) 2.5 MG tablet Take 17.5 mg by mouth once a week. Caution:Chemotherapy. Protect from light.7 tablets     mometasone (ELOCON) 0.1 % cream Apply 1 application topically daily as needed (for irritation).      Multiple Vitamin (MULTIVITAMIN PO) Take 1 tablet by mouth daily. ALIVE men's vitamin     nitroGLYCERIN (NITROSTAT) 0.4 MG SL tablet PLACE 1 TABLET UNDER THE TONGUE IF NEEDED EVERY 5 MINUTES FOR CHEST PAIN FOR 3 DOSES IF NO RELIEF AFTER FIRST DOSE CALL PRESCRIBER OR 911 25 tablet 3   pantoprazole (PROTONIX) 40 MG tablet Take 40 mg by mouth daily.     Secukinumab (COSENTYX SENSOREADY PEN) 150 MG/ML  SOAJ Inject 150 mg into the skin every 28 (twenty-eight) days.     spironolactone (ALDACTONE) 25 MG tablet TAKE 1 TABLET(25 MG) BY MOUTH DAILY 90 tablet 3   sulfaSALAzine (AZULFIDINE) 500 MG tablet Take 1,500 mg by mouth 2 (two) times daily.     No current facility-administered medications for this visit.     Past Medical History:  Diagnosis Date   AAA (abdominal aortic aneurysm)    Abnormal stress test    Allergic rhinitis    uses Flonase daily   Anginal pain    pain in neck,shoulders,down arms occ   Arthritis    Cancer    skin   Carotid artery occlusion    Chronic venous insufficiency    Coronary artery disease    Dizziness    takes Antivert daily as needed   ED (erectile dysfunction)    GERD (gastroesophageal reflux disease)    takes Omeprazole daily   Hiatal hernia    Hx of  echocardiogram 04/04/2010   showed a borderline dilatd left ventricle with mild left ventricle hypertrophy and an EF 50-55% doppler suggested diastolic dysfunction, although the pattern is probably normal for an 89 yrs old. The left atrium was indeed mildly dilated at 42 mm, but there are no significant valvular abnormalities.    Hyperlipidemia    Hypertension    takes Proscar,Dyazide,Avapro,and Metoprolol daily   Myocardial infarction Oct. 11, 2016   Heart Attack   Palpitation    Shortness of breath    Sigmoid diverticulosis    T12 compression fracture    Thyroid disease     ROS:   All systems reviewed and negative except as noted in the HPI.   Past Surgical History:  Procedure Laterality Date   ABDOMINAL AORTIC ANEURYSM REPAIR  02/24/2009   AAA Stenting   CARDIAC CATHETERIZATION N/A 08/04/2015   Procedure: Right/Left Heart Cath and Coronary Angiography;  Surgeon: Laurey Morale, MD;  Location: [facility] INVASIVE CV LAB;  Service: Cardiovascular;  Laterality: N/A;   CARDIAC CATHETERIZATION N/A 08/04/2015   Procedure: Coronary Stent Intervention;  Surgeon: Kathleene Hazel, MD;  Location: [facility] INVASIVE CV LAB;  Service: Cardiovascular;  Laterality: N/A;   CARDIAC CATHETERIZATION N/A 08/08/2015   Procedure: Coronary Stent Intervention;  Surgeon: Peter M Swaziland, MD;  Location: [facility] INVASIVE CV LAB;  Service: Cardiovascular;  Laterality: N/A;   ENDARTERECTOMY Right 10/27/2013   Procedure: ENDARTERECTOMY CAROTID;  Surgeon: Sherren Kerns, MD;  Location: [facility] OR;  Service: Vascular;  Laterality: Right;   EP IMPLANTABLE DEVICE N/A 01/26/2016   Procedure: BiV Pacemaker Insertion CRT-P;  Surgeon: Marinus Maw, MD;  Location: [facility] INVASIVE CV LAB;  Service: Cardiovascular;  Laterality: N/A;   HAND SURGERY Right    tendon, lft 90's   HERNIA REPAIR Bilateral    LEFT HEART CATHETERIZATION WITH CORONARY ANGIOGRAM N/A 10/12/2013   Procedure: LEFT HEART CATHETERIZATION WITH CORONARY ANGIOGRAM;   Surgeon: Runell Gess, MD;  Location: [facility] CATH LAB;  Service: Cardiovascular;  Laterality: N/A;   melanoma removed     ROTATOR CUFF REPAIR  2003   left arm     Family History  Problem Relation Age of Onset   Heart disease Mother        before age 24   COPD Mother    Diabetes Mother    Hyperlipidemia Mother    Hypertension Mother      Social History   Socioeconomic History   Marital status: Widowed    Spouse name:  Not on file   Number of children: Not on file   Years of education: Not on file   Highest education level: Not on file  Occupational History   Not on file  Tobacco Use   Smoking status: Former    Packs/day: 2.00    Years: 30.00    Additional pack years: 0.00    Total pack years: 60.00    Types: Cigarettes    Quit date: 10/22/1977    Years since quitting: 45.3    Passive exposure: Never   Smokeless tobacco: Former    Quit date: 10/22/1984  Substance and Sexual Activity   Alcohol use: Yes    Alcohol/week: 7.0 standard drinks of alcohol    Types: 7 Cans of beer per week   Drug use: No   Sexual activity: Not on file  Other Topics Concern   Not on file  Social History Narrative   Not on file   Social Determinants of Health   Financial Resource Strain: Not on file  Food Insecurity: No Food Insecurity (11/07/2022)   Hunger Vital Sign    Worried About Running Out of Food in the Last Year: Never true    Ran Out of Food in the Last Year: Never true  Transportation Needs: Not on file  Physical Activity: Not on file  Stress: Not on file  Social Connections: Not on file  Intimate Partner Violence: Not on file     BP (!) 114/48   Pulse 60   Ht  (1.778 m)   Wt 161 lb 6.4 oz (73.2 kg)   SpO2 95%   BMI 23.16 kg/m   Physical Exam:  Well appearing NAD HEENT: Unremarkable Neck:  No JVD, no thyromegally Lymphatics:  No adenopathy Back:  No CVA tenderness Lungs:  Clear HEART:  Regular rate rhythm, no murmurs, no rubs, no clicks Abd:  soft,  positive bowel sounds, no organomegally, no rebound, no guarding Ext:  2 plus pulses, no edema, no cyanosis, no clubbing Skin:  No rashes no nodules Neuro:  CN II through XII intact, motor grossly intact  EKG - nsr with P synch biv pacing  DEVICE  Normal device function.  See PaceArt for details.   Assess/Plan: Chronic systolic heart failure - he is doing well with class 2 symptoms and his ef has almost normalized. He will continue guideline directed medical therapy. 2. PPM - his St. Jude Biv PPM is working normally. Thresholds and sensing are excellent. 3. CAD - he is s/p NSTEMI and denies any anginal symptoms. 4. Dyslipidemia - he will continue atorvastatin. LIpids are well controlled.   Henry Gowda Kazmir Oki,MD

## 2023-02-06 ENCOUNTER — Ambulatory Visit (HOSPITAL_COMMUNITY)
Admission: RE | Admit: 2023-02-06 | Discharge: 2023-02-06 | Disposition: A | Discharge status: Home / Self Care | Payer: Medicare Other | Source: Ambulatory Visit | Attending: Physician Assistant | Admitting: Physician Assistant

## 2023-02-06 DIAGNOSIS — R1310 Dysphagia, unspecified: Secondary | ICD-10-CM | POA: Diagnosis not present

## 2023-02-06 DIAGNOSIS — D649 Anemia, unspecified: Secondary | ICD-10-CM | POA: Diagnosis not present

## 2023-02-06 DIAGNOSIS — R1314 Dysphagia, pharyngoesophageal phase: Secondary | ICD-10-CM | POA: Diagnosis not present

## 2023-02-06 DIAGNOSIS — K224 Dyskinesia of esophagus: Secondary | ICD-10-CM | POA: Diagnosis not present

## 2023-02-06 DIAGNOSIS — K449 Diaphragmatic hernia without obstruction or gangrene: Secondary | ICD-10-CM | POA: Diagnosis not present

## 2023-02-12 ENCOUNTER — Telehealth: Payer: Self-pay | Admitting: Physician Assistant

## 2023-02-12 NOTE — Progress Notes (Signed)
[facility] PREP Weekly Session  Patient Details  Name: Henry Neal MRN: [id number] Date of Birth: [birth year] Age: 89 y.o. PCP: Georgianne Fick, MD  Vitals:   02/12/23 1058  Weight: 160 lb (72.6 kg)     [facility] Weekly seesion - 02/12/23 1000       [facility] "PREP" Location   [facility] "PREP" Location [facility]      Weekly Session   Topic Discussed Stress management and problem solving   finger tip mudra breathwork; importance of sleep: 7-9 hrs/night   Classes attended to date 7             Maynard David B Kayann Maj 02/12/2023, 11:00 AM

## 2023-02-12 NOTE — Telephone Encounter (Signed)
Patient called is requesting for the nurse to call him again and go over his recommendations from Quentin Mulling PA-C states he received stool cards in the mail but does not remember what to do.

## 2023-02-13 NOTE — Telephone Encounter (Signed)
See result note. Spoke with pt and questions answered.

## 2023-02-19 NOTE — Progress Notes (Signed)
[facility] PREP Weekly Session  Patient Details  Name: Henry Neal MRN: [id number] Date of Birth: 1926-12-18 Age: 89 y.o. PCP: Georgianne Fick, MD  Vitals:   02/19/23 1110  Weight: 158 lb (71.7 kg)     [facility] Weekly seesion - 02/19/23 1100       [facility] "PREP" Location   [facility] "PREP" Location [facility]      Weekly Session   Topic Discussed Expectations and non-scale victories   Halfway through program, revisit goals; staying positive   Classes attended to date 93             [name] B Charl Wellen 02/19/2023, 11:11 AM

## 2023-02-22 ENCOUNTER — Other Ambulatory Visit: Payer: Self-pay | Admitting: Internal Medicine

## 2023-02-26 NOTE — Progress Notes (Signed)
[facility] PREP Weekly Session  Patient Details  Name: Henry Neal MRN: [id number] Date of Birth: 1927-07-14 Age: 89 y.o. PCP: Georgianne Fick, MD  Vitals:   02/26/23 1101  Weight: 157 lb (71.2 kg)     [facility] Weekly seesion - 02/26/23 1100       [facility] "PREP" Location   [facility] "PREP" Location [facility]      Weekly Session   Topic Discussed Other   Portion control; visualize portion size demo; review of food label red sugar craisins.   Classes attended to date 41             Texie Tupou B Payslee Bateson 02/26/2023, 11:03 AM

## 2023-02-27 NOTE — Progress Notes (Signed)
Patient ID: Henry Neal, male   DOB: 1927/03/18, 89 y.o.   MRN: [id number]     Advanced Heart Failure Clinic Note  PCP: Dr. 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 male with history of CAD, chronic systolic CHF, LBBB, AAA s/p repair presents for followup of CHF and CAD.  He had a cardiac cath in 12/15, showing total occlusion of the LAD and 50-60% proximal RCA.  EF at that time was near-normal.  In 10/16, he developed dyspnea and epigastric pain.  He was noted to be in acute pulmonary edema and was diuresed.  Troponin was only mildly elevated at 0.47.  He ultimately underwent LHC showing hazy 80-90% RCA stenosis which was likely the culprit for his presentation.  Additionally, the LAD was only subtotally occluded on this cath.  He had DES to RCA.  Cardiac MRI was done, showing EF 22% with significant viability in LAD territory.  Therefore, CTO of LAD was opened with DES.  Echo in 2/17 showed EF persistently low at 15%.  He had St Jude CRT-P placement in 4/17. Echo in 1/19 showed EF up to 45-50% with mild MR.    Echo in 3/20 showed EF 45% with mild diffuse hypokinesis, mildly decreased RV systolic function. Echo in 3/21 showed EF 45-50%, mild RV dilation with normal systolic function. Echo in 10/22 showed EF 50-55%, normal RV, mild MR.    He was diagnosed with ankylosing spondylitis and is now on methotrexate.    Echo was done today and reviewed, EF 45-50%, mild RV dysfunction.   Today he returns for AHF follow up. Overall feeling ***. Denies palpitations, CP, dizziness, edema, or PND/Orthopnea. *** SOB. Appetite ok. No fever or chills. Weight at home *** pounds. Taking all medications.  He is still working as a Chartered certified accountant. He walks with a cane for balance.  ECG (personally reviewed): 4/24 reviewed: a sensed v paced 60 bpm   Labs (1/18): LDL 37 Labs (01/25/2017): K 3.5 Creatinine 0.96  Labs (8/18): BNP 30, K 3.7, creatinine 1.0 Labs (11/18): LDL 40, HDL 47, K 3.5,  creatinine 0.96  Labs (3/19): K 4, creatinine 0.97 Labs (11/19): K 3.9, creatinine 1.2, LDL 44 Labs (4/20): K 4.1, creatinine 1.11 Labs (10/20): LDL 46 Labs (11/20): K 4.4, creatinine 1.05 Labs (3/21): K 4.2, creatinine 0.92 Labs (7/21): LDL 58 Labs (8/21): K 4, creatinine 1.27 Labs (11/21): K 3.9, creatinine 0.88 Labs (9/22): K 3.6, creatinine 0.82, LDL 44 Labs (6/23): K 4.6, creatinine 0.86 Labs (9/23): K 4.5, creatinine 0.97, LDL 49, HDL 68 Labs (4/24): K 3.9, SCr 0.80, tSat 16, ferritin 7.1  PMH: 1. CKD 2. AAA s/p repair, followed at [facility]. 3. Carotid stenosis: s/p right CEA.   Carotid dopplers (2/16) with right CEA stable, < 40% LICA stenosis.  Followed at [facility].  - Carotid dopplers (3/18): 1-39% LICA, patent right CEA.  4. Chronic LBBB 5. LAD: LHC (12/15) with totally occluded LAD, 50-60% pRCA => medically managed.  10/16 admitted with NSTEMI.  LHC with subtotalled LAD occlusion, 80-90% pRCA with thrombus => DES RCA initially.  Cardiac MRI was done showing viability in the LAD territory, so he had DES to the CTO LAD.   6. Chronic systolic CHF: Ischemic cardiomyopathy.  Echo (10/16) with EF 15% with wall motion abnormalities, moderately dilated RV with normal systolic function.  CMRI (10/16) with severe LV dilation, EF 22% with septal-lateral dyssynchrony, no LV thrombus, normal RV size/systolic function => significant viability noted in  the LAD territory.  Echo (2/17): EF 15%, severe LV dilation, wall motion abnormalities noted, mild MR, mildly dilated RV with normal systolic function, PASP 55 mmHg.  St Jude CRT-P 4/17.  - Echo (1/18): EF 50-55%, mild to moderate MR.  - Echo (1/19): EF 45-50%, mild LVH, inferior hypokinesis, mild MR, PASP 30 mmHg.  - Echo (3/20): EF 45%, diffuse hypokinesis, normal RV size with mildly decreased systolic function.  - Echo (3/21): EF 45-50%, mildly dilated RV with normal systolic function.  - Echo (10/22): EF 50-55%, normal RV, mild MR - Echo (1/24): EF  45-50%, mild RV dysfunction 7. ABIs (10/16) were normal.  8. Raynaud's syndrome.  9. Sciatica  SH: Lives alone, prior smoker, still working as a Chartered certified accountant.  FH: CAD  Review of systems complete and found to be negative unless listed in HPI.    Current Outpatient Medications  Medication Sig Dispense Refill   acetaminophen (TYLENOL) 500 MG tablet Take 1,000 mg by mouth daily as needed for mild pain or headache. Reported on 01/25/2016     atorvastatin (LIPITOR) 40 MG tablet Take 1 tablet (40 mg total) by mouth daily at 6 PM. 30 tablet 6   Calcium Carbonate-Vitamin D (CALCIUM 600 + D PO) Take 1 tablet by mouth 2 (two) times daily.     carvedilol (COREG) 12.5 MG tablet TAKE 1 TABLET(12.5 MG) BY MOUTH TWICE DAILY 60 tablet 11   clopidogrel (PLAVIX) 75 MG tablet TAKE 1 TABLET(75 MG) BY MOUTH DAILY 90 tablet 1   clotrimazole (LOTRIMIN) 1 % cream Apply 1 Application topically as needed.     ENTRESTO 24-26 MG TAKE 1 TABLET BY MOUTH TWICE DAILY 60 tablet 4   ferrous sulfate 325 (65 FE) MG tablet Take 1 tablet (325 mg total) by mouth daily with breakfast. 30 tablet 3   finasteride (PROSCAR) 5 MG tablet take 1 tablet by mouth once daily     fluticasone (FLONASE) 50 MCG/ACT nasal spray Place 1 spray into both nostrils daily as needed for allergies.      folic acid (FOLVITE) 1 MG tablet Take 1 mg by mouth daily.     furosemide (LASIX) 20 MG tablet TAKE 1 TABLET(20 MG) BY MOUTH EVERY OTHER DAY 15 tablet 6   gabapentin (NEURONTIN) 300 MG capsule Take 300 mg by mouth 2 (two) times daily.  0   meclizine (ANTIVERT) 25 MG tablet Take 1 tablet (25 mg total) by mouth 2 (two) times daily as needed for dizziness. 30 tablet 0   meloxicam (MOBIC) 15 MG tablet Take 15 mg by mouth daily as needed for pain.  0   methotrexate (RHEUMATREX) 2.5 MG tablet Take 17.5 mg by mouth once a week. Caution:Chemotherapy. Protect from light.7 tablets     mometasone (ELOCON) 0.1 % cream Apply 1 application topically daily as needed  (for irritation).      Multiple Vitamin (MULTIVITAMIN PO) Take 1 tablet by mouth daily. ALIVE men's vitamin     nitroGLYCERIN (NITROSTAT) 0.4 MG SL tablet PLACE 1 TABLET UNDER THE TONGUE IF NEEDED EVERY 5 MINUTES FOR CHEST PAIN FOR 3 DOSES IF NO RELIEF AFTER FIRST DOSE CALL PRESCRIBER OR 911 25 tablet 3   pantoprazole (PROTONIX) 40 MG tablet Take 40 mg by mouth daily.     Secukinumab (COSENTYX SENSOREADY PEN) 150 MG/ML SOAJ Inject 150 mg into the skin every 28 (twenty-eight) days.     spironolactone (ALDACTONE) 25 MG tablet TAKE 1 TABLET(25 MG) BY MOUTH DAILY 90 tablet 3  sulfaSALAzine (AZULFIDINE) 500 MG tablet Take 1,500 mg by mouth 2 (two) times daily.     No current facility-administered medications for this visit.   There were no vitals taken for this visit.  Wt Readings from Last 3 Encounters:  02/26/23 71.2 kg (157 lb)  02/19/23 71.7 kg (158 lb)  02/12/23 72.6 kg (160 lb)    General:  *** appearing.  No respiratory difficulty HEENT: normal Neck: supple. JVD *** cm. Carotids 2+ bilat; no bruits. No lymphadenopathy or thyromegaly appreciated. Cor: PMI nondisplaced. Regular rate & rhythm. No rubs, gallops or murmurs. Lungs: clear Abdomen: soft, nontender, nondistended. No hepatosplenomegaly. No bruits or masses. Good bowel sounds. Extremities: no cyanosis, clubbing, rash, edema  Neuro: alert & oriented x 3, cranial nerves grossly intact. moves all 4 extremities w/o difficulty. Affect pleasant.   Assessment/Plan: 1. CAD: NSTEMI with DES to RCA (culprit vessel) and subsequent DES to CTO of LAD (cMRI showed viability in the LAD distribution) in 10/16. No chest pain.  - Continue Plavix, CBC today.  - atorvastatin 40 daily, good lipids in 9/23 2. Chronic HF with mid range EF: Ischemic cardiomyopathy. Echo in 3/21 showed EF 45-50%, improved since getting CRT.  Echo in 10/22 showed EF 50-55%.  Echo today was reviewed and showed EF 45-50%.  St Jude CRT-D.  NYHA class II.  He is not volume  overloaded on exam.   - Continue Lasix 20 mg qod. BMET today.  - Continue Coreg 12.5 mg bid  - Continue spironolactone 25 mg daily.   - Continue Entresto 24/26 mg BID.  - I will refer him to the [facility] PREP program.   3. Carotid stenosis: Minimal carotid stenosis on 9/22 dopplers.   4. Ankylosing spondylitis: Chronic back pain. He is on MTX.    Followup 4 months with APP.   Alen Bleacher, NP  02/27/2023

## 2023-02-28 ENCOUNTER — Encounter (HOSPITAL_COMMUNITY): Payer: Self-pay

## 2023-02-28 ENCOUNTER — Ambulatory Visit (HOSPITAL_COMMUNITY)
Admission: RE | Admit: 2023-02-28 | Discharge: 2023-02-28 | Disposition: A | Discharge status: Home / Self Care | Payer: Medicare Other | Source: Ambulatory Visit | Attending: Internal Medicine | Admitting: Internal Medicine

## 2023-02-28 VITALS — BP 82/48 | HR 63 | Wt 161.6 lb

## 2023-02-28 DIAGNOSIS — I2582 Chronic total occlusion of coronary artery: Secondary | ICD-10-CM | POA: Diagnosis not present

## 2023-02-28 DIAGNOSIS — M549 Dorsalgia, unspecified: Secondary | ICD-10-CM | POA: Insufficient documentation

## 2023-02-28 DIAGNOSIS — I2510 Atherosclerotic heart disease of native coronary artery without angina pectoris: Secondary | ICD-10-CM | POA: Diagnosis not present

## 2023-02-28 DIAGNOSIS — Z7902 Long term (current) use of antithrombotics/antiplatelets: Secondary | ICD-10-CM | POA: Diagnosis not present

## 2023-02-28 DIAGNOSIS — I6521 Occlusion and stenosis of right carotid artery: Secondary | ICD-10-CM | POA: Insufficient documentation

## 2023-02-28 DIAGNOSIS — I6529 Occlusion and stenosis of unspecified carotid artery: Secondary | ICD-10-CM | POA: Diagnosis not present

## 2023-02-28 DIAGNOSIS — M459 Ankylosing spondylitis of unspecified sites in spine: Secondary | ICD-10-CM | POA: Insufficient documentation

## 2023-02-28 DIAGNOSIS — I959 Hypotension, unspecified: Secondary | ICD-10-CM | POA: Insufficient documentation

## 2023-02-28 DIAGNOSIS — D649 Anemia, unspecified: Secondary | ICD-10-CM

## 2023-02-28 DIAGNOSIS — Z955 Presence of coronary angioplasty implant and graft: Secondary | ICD-10-CM | POA: Insufficient documentation

## 2023-02-28 DIAGNOSIS — I5022 Chronic systolic (congestive) heart failure: Secondary | ICD-10-CM | POA: Insufficient documentation

## 2023-02-28 DIAGNOSIS — I252 Old myocardial infarction: Secondary | ICD-10-CM | POA: Diagnosis not present

## 2023-02-28 DIAGNOSIS — G8929 Other chronic pain: Secondary | ICD-10-CM | POA: Insufficient documentation

## 2023-02-28 DIAGNOSIS — Z79899 Other long term (current) drug therapy: Secondary | ICD-10-CM | POA: Insufficient documentation

## 2023-02-28 DIAGNOSIS — I255 Ischemic cardiomyopathy: Secondary | ICD-10-CM | POA: Insufficient documentation

## 2023-02-28 LAB — BASIC METABOLIC PANEL
Anion gap: 9 (ref 5–15)
BUN: 15 mg/dL (ref 8–23)
CO2: 27 mmol/L (ref 22–32)
Calcium: 9.2 mg/dL (ref 8.9–10.3)
Chloride: 99 mmol/L (ref 98–111)
Creatinine, Ser: 0.96 mg/dL (ref 0.61–1.24)
GFR, Estimated: >60 mL/min (ref >60)
Glucose, Bld: 109 mg/dL — ABNORMAL HIGH (ref 70–99)
Potassium: 3.9 mmol/L (ref 3.5–5.1)
Sodium: 135 mmol/L (ref 135–145)

## 2023-02-28 LAB — CBC
HCT: 34.4 % — ABNORMAL LOW (ref 39.0–52.0)
Hemoglobin: 11.9 g/dL — ABNORMAL LOW (ref 13.0–17.0)
MCH: 32.2 pg (ref 26.0–34.0)
MCHC: 34.6 g/dL (ref 30.0–36.0)
MCV: 93.2 fL (ref 80.0–100.0)
Platelets: 136 10*3/uL — ABNORMAL LOW (ref 150–400)
RBC: 3.69 MIL/uL — ABNORMAL LOW (ref 4.22–5.81)
RDW: 14.1 % (ref 11.5–15.5)
WBC: 4.7 10*3/uL (ref 4.0–10.5)
nRBC: 0 % (ref 0.0–0.2)

## 2023-02-28 LAB — LIPID PANEL
Cholesterol: 115 mg/dL (ref 0–200)
HDL: 59 mg/dL (ref >40)
LDL Cholesterol: 41 mg/dL (ref 0–99)
Total CHOL/HDL Ratio: 1.9 RATIO
Triglycerides: 73 mg/dL (ref <150)
VLDL: 15 mg/dL (ref 0–40)

## 2023-02-28 LAB — BRAIN NATRIURETIC PEPTIDE: B Natriuretic Peptide: 75.6 pg/mL (ref 0.0–100.0)

## 2023-02-28 MED ORDER — FUROSEMIDE 20 MG PO TABS: 20.0000 mg | ORAL_TABLET | ORAL | 6 refills | Status: DC | PRN

## 2023-02-28 MED ORDER — CARVEDILOL 3.125 MG PO TABS: 9.3750 mg | ORAL_TABLET | Freq: Two times a day (BID) | ORAL | 3 refills | Status: DC

## 2023-02-28 NOTE — Patient Instructions (Addendum)
Medication Changes:  DECREASE Coreg to 9.375 mg (3 tablets) twice a day   STOP taking lasix DAILY, use lasix as needed.   STOP taking [employer] Work:  Labs done today, your results will be available in MyChart, we will contact you for abnormal readings.  Follow-Up in:   Your physician recommends that you schedule a follow-up appointment in: 3-4 weeks    Do the following things EVERYDAY: Weigh yourself in the morning before breakfast. Write it down and keep it in a log. Take your medicines as prescribed Eat low salt foods--Limit salt (sodium) to 2000 mg per day.  Stay as active as you can everyday Limit all fluids for the day to less than 2 liters    Need to Contact us:  If you have any questions or concerns before your next appointment please send [place] a message through [place] or call our office at [phone].    TO LEAVE A MESSAGE FOR THE NURSE SELECT OPTION 2, PLEASE LEAVE A MESSAGE INCLUDING: YOUR NAME DATE OF BIRTH CALL BACK NUMBER REASON FOR CALL**this is important as we prioritize the call backs  YOU WILL RECEIVE A CALL BACK THE SAME DAY AS LONG AS YOU CALL BEFORE 4:00 PM   At the [facility], you and your health needs are our priority. As part of our continuing mission to provide you with exceptional heart care, we have created designated Provider Care Teams. These Care Teams include your primary Cardiologist (physician) and Advanced Practice Providers (APPs- Physician Assistants and Nurse Practitioners) who all work together to provide you with the care you need, when you need it.   You may see any of the following providers on your designated Care Team at your next follow up: Dr Arvilla Meres Dr Marca Ancona Dr. Marcos Eke, NP Robbie Lis, [place] [facility] [place], [place] Brynda Peon, NP Karle Plumber, PharmD   Please be sure to bring in all your medications bottles to every appointment.    Thank you  for choosing [facility]

## 2023-03-05 NOTE — Progress Notes (Signed)
[facility] PREP Weekly Session  Patient Details  Name: Henry Neal MRN: [id number] Date of Birth: 11-24-1926 Age: 89 y.o. PCP: Georgianne Fick, MD  Vitals:   03/05/23 1108  Weight: 156 lb 9.6 oz (71 kg)     [facility] Weekly seesion - 03/05/23 1100       [facility] "PREP" Location   [facility] "PREP" Location [facility]      Weekly Session   Topic Discussed Finding support   Review of multiple food labels brought from home   Classes attended to date 41             Henry Neal [name] 03/05/2023, 11:09 AM

## 2023-03-08 ENCOUNTER — Ambulatory Visit (INDEPENDENT_AMBULATORY_CARE_PROVIDER_SITE_OTHER): Payer: Medicare Other

## 2023-03-08 DIAGNOSIS — I441 Atrioventricular block, second degree: Secondary | ICD-10-CM | POA: Diagnosis not present

## 2023-03-08 LAB — CUP PACEART REMOTE DEVICE CHECK
Battery Remaining Longevity: 31 mo
Battery Remaining Percentage: 28 %
Battery Voltage: 2.9 V
Brady Statistic AP VP Percent: 3.2 %
Brady Statistic AP VS Percent: 1 %
Brady Statistic AS VP Percent: 97 %
Brady Statistic AS VS Percent: 1 %
Brady Statistic RA Percent Paced: 2.9 %
Date Time Interrogation Session: 20240517020015
Implantable Lead Connection Status: 753985
Implantable Lead Connection Status: 753985
Implantable Lead Connection Status: 753985
Implantable Lead Implant Date: 20170406
Implantable Lead Implant Date: 20170406
Implantable Lead Implant Date: 20170406
Implantable Lead Location: 753858
Implantable Lead Location: 753859
Implantable Lead Location: 753860
Implantable Pulse Generator Implant Date: 20170406
Lead Channel Impedance Value: 450 Ohm
Lead Channel Impedance Value: 460 Ohm
Lead Channel Impedance Value: 790 Ohm
Lead Channel Pacing Threshold Amplitude: 0.5 V
Lead Channel Pacing Threshold Amplitude: 1.25 V
Lead Channel Pacing Threshold Amplitude: 1.5 V
Lead Channel Pacing Threshold Pulse Width: 0.4 ms
Lead Channel Pacing Threshold Pulse Width: 0.4 ms
Lead Channel Pacing Threshold Pulse Width: 0.4 ms
Lead Channel Sensing Intrinsic Amplitude: 12 mV
Lead Channel Sensing Intrinsic Amplitude: 2.1 mV
Lead Channel Setting Pacing Amplitude: 2 V
Lead Channel Setting Pacing Amplitude: 2.25 V
Lead Channel Setting Pacing Amplitude: 2.5 V
Lead Channel Setting Pacing Pulse Width: 0.4 ms
Lead Channel Setting Pacing Pulse Width: 0.4 ms
Lead Channel Setting Sensing Sensitivity: 2 mV
Pulse Gen Model: 3262
Pulse Gen Serial Number: 7866838

## 2023-03-15 ENCOUNTER — Ambulatory Visit: Payer: Medicare Other | Admitting: Podiatry

## 2023-03-15 ENCOUNTER — Encounter: Payer: Self-pay | Admitting: Podiatry

## 2023-03-15 DIAGNOSIS — M79676 Pain in unspecified toe(s): Secondary | ICD-10-CM | POA: Diagnosis not present

## 2023-03-15 DIAGNOSIS — B351 Tinea unguium: Secondary | ICD-10-CM | POA: Diagnosis not present

## 2023-03-15 DIAGNOSIS — I739 Peripheral vascular disease, unspecified: Secondary | ICD-10-CM

## 2023-03-15 DIAGNOSIS — H5712 Ocular pain, left eye: Secondary | ICD-10-CM | POA: Diagnosis not present

## 2023-03-15 NOTE — Progress Notes (Signed)
This patient returns to my office for at risk foot care.  This patient requires this care by a professional since this patient will be at risk due to having renal insufficiency coagulation defect and PAD.   Patient is taking plavix.    This patient is unable to cut nails himself since the patient cannot reach his nails.These nails are painful walking and wearing shoes.  This patient presents for at risk foot care today.  General Appearance  Alert, conversant and in no acute stress.  Vascular  Dorsalis pedis and posterior tibial  pulses are weakly  palpable  bilaterally.  Capillary return is within normal limits  bilaterally. Temperature is within normal limits  bilaterally.  Neurologic  Senn-Weinstein monofilament wire test within normal limits  bilaterally. Muscle power within normal limits bilaterally.  Nails Thick disfigured discolored nails with subungual debris  from hallux to fifth toes bilaterally. No evidence of bacterial infection or drainage bilaterally.  Orthopedic  No limitations of motion  feet .  No crepitus or effusions noted.  No bony pathology or digital deformities noted.  Skin  normotropic skin with no porokeratosis noted bilaterally.  No signs of infections or ulcers noted.     Onychomycosis  Pain in right toes  Pain in left toes  Consent was obtained for treatment procedures.   Mechanical debridement of nails 1-5  bilaterally performed with a nail nipper.  Filed with dremel without incident.    Return office visit   10 weeks                   Told patient to return for periodic foot care and evaluation due to potential at risk complications. Crest pad made for right toes.   Helane Gunther DPM

## 2023-03-19 ENCOUNTER — Telehealth: Payer: Self-pay

## 2023-03-19 NOTE — Telephone Encounter (Signed)
He called to let  me know he will need to withdraw from PREP at this time; his son is critically ill in the hospital. Asked him to let me know when he is ready/able to re-start and will re-enroll him in another class.

## 2023-03-20 ENCOUNTER — Ambulatory Visit: Payer: Medicare Other | Admitting: Physician Assistant

## 2023-03-20 NOTE — Progress Notes (Signed)
Remote pacemaker transmission.   

## 2023-03-21 NOTE — Progress Notes (Signed)
Patient ID: Henry Neal, male   DOB: May 21, 1927, 89 y.o.   MRN: [id number]     [facility] Note  PCP: Dr. 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 male with history of CAD, chronic systolic CHF, LBBB, AAA s/p repair presents for followup of CHF and CAD.  He had a cardiac cath in 12/15, showing total occlusion of the LAD and 50-60% proximal RCA.  EF at that time was near-normal.  In 10/16, he developed dyspnea and epigastric pain.  He was noted to be in acute pulmonary edema and was diuresed.  Troponin was only mildly elevated at 0.47.  He ultimately underwent LHC showing hazy 80-90% RCA stenosis which was likely the culprit for his presentation.  Additionally, the LAD was only subtotally occluded on this cath.  He had DES to RCA.  Cardiac MRI was done, showing EF 22% with significant viability in LAD territory.  Therefore, CTO of LAD was opened with DES.  Echo in 2/17 showed EF persistently low at 15%.  He had St Jude CRT-P placement in 4/17. Echo in 1/19 showed EF up to 45-50% with mild MR.    Echo 3/20 showed EF 45% with mild diffuse hypokinesis, mildly decreased RV systolic function. Echo in 3/21 showed EF 45-50%, mild RV dilation with normal systolic function. Echo in 10/22 showed EF 50-55%, normal RV, mild MR.    He was diagnosed with ankylosing spondylitis and is now on methotrexate.    Echo 1/24 EF 45-50%, mild RV dysfunction.   Today he returns for AHF follow up. Overall very stressed, son has been critically ill for 3 weeks in MICU here, was just up visiting him. Denies palpitations, CP, edema, or PND/Orthopnea. Dizziness has improved since last visit. No SOB. Appetite fair, hasn't been eating too good recently with everything going on. No fever or chills. Weight at home 155 pounds. Taking all medications. Has not been taking lasix too often. Stopped the PREP program for now. 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   ECG (personally  reviewed): No new EKG today   Labs (1/18): LDL 37 Labs (01/25/2017): K 3.5 Creatinine 0.96  Labs (8/18): BNP 30, K 3.7, creatinine 1.0 Labs (11/18): LDL 40, HDL 47, K 3.5, creatinine 6.57  Labs (3/19): K 4, creatinine 0.97 Labs (11/19): K 3.9, creatinine 1.2, LDL 44 Labs (4/20): K 4.1, creatinine 1.11 Labs (10/20): LDL 46 Labs (11/20): K 4.4, creatinine 1.05 Labs (3/21): K 4.2, creatinine 0.92 Labs (7/21): LDL 58 Labs (8/21): K 4, creatinine 1.27 Labs (11/21): K 3.9, creatinine 0.88 Labs (9/22): K 3.6, creatinine 0.82, LDL 44 Labs (6/23): K 4.6, creatinine 0.86 Labs (9/23): K 4.5, creatinine 0.97, LDL 49, HDL 68 Labs (4/24): K 3.9, SCr 0.80, tSat 16, ferritin 7.1 Labs (5/24): K 3.9, SCr .96  PMH: 1. CKD 2. AAA s/p repair, followed at [facility]. 3. Carotid stenosis: s/p right CEA.   Carotid dopplers (2/16) with right CEA stable, < 40% LICA stenosis.  Followed at [facility].  - Carotid dopplers (3/18): 1-39% LICA, patent right CEA.  4. Chronic LBBB 5. LAD: LHC (12/15) with totally occluded LAD, 50-60% pRCA => medically managed.  10/16 admitted with NSTEMI.  LHC with subtotalled LAD occlusion, 80-90% pRCA with thrombus => DES RCA initially.  Cardiac MRI was done showing viability in the LAD territory, so he had DES to the CTO LAD.   6. Chronic systolic CHF: Ischemic cardiomyopathy.  Echo (10/16) with  EF 15% with wall motion abnormalities, moderately dilated RV with normal systolic function.  CMRI (10/16) with severe LV dilation, EF 22% with septal-lateral dyssynchrony, no LV thrombus, normal RV size/systolic function => significant viability noted in the LAD territory.  Echo (2/17): EF 15%, severe LV dilation, wall motion abnormalities noted, mild MR, mildly dilated RV with normal systolic function, PASP 55 mmHg.  St Jude CRT-P 4/17.  - Echo (1/18): EF 50-55%, mild to moderate MR.  - Echo (1/19): EF 45-50%, mild LVH, inferior hypokinesis, mild MR, PASP 30 mmHg.  - Echo (3/20): EF 45%, diffuse  hypokinesis, normal RV size with mildly decreased systolic function.  - Echo (3/21): EF 45-50%, mildly dilated RV with normal systolic function.  - Echo (10/22): EF 50-55%, normal RV, mild MR - Echo (1/24): EF 45-50%, mild RV dysfunction 7. ABIs (10/16) were normal.  8. Raynaud's syndrome.  9. Sciatica  SH: Lives alone, prior smoker, still working as a Chartered certified accountant.  FH: CAD  Review of systems complete and found to be negative unless listed in HPI.    Current Outpatient Medications  Medication Sig Dispense Refill   acetaminophen (TYLENOL) 500 MG tablet Take 1,000 mg by mouth daily as needed for mild pain or headache. Reported on 01/25/2016     atorvastatin (LIPITOR) 40 MG tablet Take 1 tablet (40 mg total) by mouth daily at 6 PM. 30 tablet 6   Calcium Carbonate-Vitamin D (CALCIUM 600 + D PO) Take 1 tablet by mouth 2 (two) times daily.     carvedilol (COREG) 3.125 MG tablet Take 3 tablets (9.375 mg total) by mouth 2 (two) times daily with a meal. 180 tablet 3   clopidogrel (PLAVIX) 75 MG tablet TAKE 1 TABLET(75 MG) BY MOUTH DAILY 90 tablet 1   clotrimazole (LOTRIMIN) 1 % cream Apply 1 Application topically as needed.     finasteride (PROSCAR) 5 MG tablet take 1 tablet by mouth once daily     fluticasone (FLONASE) 50 MCG/ACT nasal spray Place 1 spray into both nostrils daily as needed for allergies.      folic acid (FOLVITE) 1 MG tablet Take 1 mg by mouth daily.     furosemide (LASIX) 20 MG tablet Take 1 tablet (20 mg total) by mouth as needed for fluid or edema (for a weight gain of 3-5 in a week.). 15 tablet 6   gabapentin (NEURONTIN) 300 MG capsule Take 300 mg by mouth 2 (two) times daily.  0   meclizine (ANTIVERT) 25 MG tablet Take 1 tablet (25 mg total) by mouth 2 (two) times daily as needed for dizziness. 30 tablet 0   meloxicam (MOBIC) 15 MG tablet Take 15 mg by mouth daily as needed for pain.  0   mometasone (ELOCON) 0.1 % cream Apply 1 application topically daily as needed (for  irritation).      Multiple Vitamin (MULTIVITAMIN PO) Take 1 tablet by mouth daily. ALIVE men's vitamin     nitroGLYCERIN (NITROSTAT) 0.4 MG SL tablet PLACE 1 TABLET UNDER THE TONGUE IF NEEDED EVERY 5 MINUTES FOR CHEST PAIN FOR 3 DOSES IF NO RELIEF AFTER FIRST DOSE CALL PRESCRIBER OR 911 25 tablet 3   pantoprazole (PROTONIX) 40 MG tablet Take 40 mg by mouth daily.     Secukinumab (COSENTYX SENSOREADY PEN) 150 MG/ML SOAJ Inject 150 mg into the skin every 28 (twenty-eight) days.     spironolactone (ALDACTONE) 25 MG tablet TAKE 1 TABLET(25 MG) BY MOUTH DAILY 90 tablet 3   sulfaSALAzine (AZULFIDINE)  500 MG tablet Take 1,500 mg by mouth 2 (two) times daily.     methotrexate (RHEUMATREX) 2.5 MG tablet Take 17.5 mg by mouth once a week. Caution:Chemotherapy. Protect from light.7 tablets (Patient not taking: Reported on 03/22/2023)     No current facility-administered medications for this encounter.   BP (!) 160/78   Pulse 77   Wt 72.8 kg (160 lb 6.4 oz)   SpO2 95%   BMI 23.02 kg/m   Wt Readings from Last 3 Encounters:  03/22/23 72.8 kg (160 lb 6.4 oz)  03/05/23 71 kg (156 lb 9.6 oz)  02/28/23 73.3 kg (161 lb 9.6 oz)    Physical exam:  General:  elderly appearing.  No respiratory difficulty. Walked in with cane.  HEENT: +glasses, normal Neck: supple. JVD flat cm. Carotids 2+ bilat; no bruits. No lymphadenopathy or thyromegaly appreciated. Cor: PMI nondisplaced. Regular rate & rhythm. No rubs, gallops or murmurs. Lungs: clear Abdomen: soft, nontender, nondistended. No hepatosplenomegaly. No bruits or masses. Good bowel sounds. Extremities: no cyanosis, clubbing, rash, edema. + compression socks Neuro: alert & oriented x 3, cranial nerves grossly intact. moves all 4 extremities w/o difficulty. Affect pleasant.   Device interrogation: CorVue stable. No AT/AF, AS-VP 97%  Assessment/Plan: 1. Chronic HF with mid range EF: Ischemic cardiomyopathy. Echo in 3/21 showed EF 45-50%, improved since  getting CRT.  Echo 10/22 showed EF 50-55%.  Echo 1/24 EF 45-50%.  St Jude CRT-D.  NYHA class II.  He is not volume overloaded on exam.   - Continue Lasix 20 mg daily PRN   - Continue Coreg 9.375 mg bid  - Continue spironolactone 25 mg daily.   - Entresto stopped during last visit with dizziness. Dizziness much improved. BP now elevated, will start Losartan 12.5 mg daily.  - Hasn't been doing [employer] program anymore with everything going on. Interested in going back once things get better with his son.   - BMET, BNP today. Repeat BMET 1 week.  2. CAD: NSTEMI with DES to RCA (culprit vessel) and subsequent DES to CTO of LAD (cMRI showed viability in the LAD distribution) in 10/16. No chest pain.  - Continue Plavix - Atorvastatin 40 daily. LDL 41 5/24 3. Carotid stenosis: Minimal carotid stenosis on 9/22 dopplers.   4. Ankylosing spondylitis: Chronic back pain. He is on MTX.   5. Anemia - 4/24 tSat 16, ferritin 7.1. Started PO iron - can consider IV iron, discussed again today. Wants to wait for now  - CBC stable 5/24  Follow up 2-3 months with Dr. Thressa Sheller, NP  03/22/2023

## 2023-03-22 ENCOUNTER — Ambulatory Visit (HOSPITAL_COMMUNITY)
Admission: RE | Admit: 2023-03-22 | Discharge: 2023-03-22 | Disposition: A | Discharge status: Home / Self Care | Payer: Medicare Other | Source: Ambulatory Visit | Attending: Family Medicine | Admitting: Family Medicine

## 2023-03-22 ENCOUNTER — Encounter (HOSPITAL_COMMUNITY): Payer: Self-pay

## 2023-03-22 VITALS — BP 160/78 | HR 77 | Wt 160.4 lb

## 2023-03-22 DIAGNOSIS — M549 Dorsalgia, unspecified: Secondary | ICD-10-CM | POA: Insufficient documentation

## 2023-03-22 DIAGNOSIS — I5022 Chronic systolic (congestive) heart failure: Secondary | ICD-10-CM | POA: Diagnosis not present

## 2023-03-22 DIAGNOSIS — D649 Anemia, unspecified: Secondary | ICD-10-CM | POA: Diagnosis not present

## 2023-03-22 DIAGNOSIS — I255 Ischemic cardiomyopathy: Secondary | ICD-10-CM | POA: Diagnosis not present

## 2023-03-22 DIAGNOSIS — Z7902 Long term (current) use of antithrombotics/antiplatelets: Secondary | ICD-10-CM | POA: Insufficient documentation

## 2023-03-22 DIAGNOSIS — I2582 Chronic total occlusion of coronary artery: Secondary | ICD-10-CM | POA: Insufficient documentation

## 2023-03-22 DIAGNOSIS — Z955 Presence of coronary angioplasty implant and graft: Secondary | ICD-10-CM | POA: Diagnosis not present

## 2023-03-22 DIAGNOSIS — I252 Old myocardial infarction: Secondary | ICD-10-CM | POA: Diagnosis not present

## 2023-03-22 DIAGNOSIS — I6529 Occlusion and stenosis of unspecified carotid artery: Secondary | ICD-10-CM

## 2023-03-22 DIAGNOSIS — G8929 Other chronic pain: Secondary | ICD-10-CM | POA: Insufficient documentation

## 2023-03-22 DIAGNOSIS — M459 Ankylosing spondylitis of unspecified sites in spine: Secondary | ICD-10-CM | POA: Diagnosis not present

## 2023-03-22 DIAGNOSIS — I2510 Atherosclerotic heart disease of native coronary artery without angina pectoris: Secondary | ICD-10-CM | POA: Insufficient documentation

## 2023-03-22 DIAGNOSIS — Z79899 Other long term (current) drug therapy: Secondary | ICD-10-CM | POA: Insufficient documentation

## 2023-03-22 LAB — BASIC METABOLIC PANEL
Anion gap: 6 (ref 5–15)
BUN: 13 mg/dL (ref 8–23)
CO2: 27 mmol/L (ref 22–32)
Calcium: 9.1 mg/dL (ref 8.9–10.3)
Chloride: 100 mmol/L (ref 98–111)
Creatinine, Ser: 0.77 mg/dL (ref 0.61–1.24)
GFR, Estimated: >60 mL/min (ref >60)
Glucose, Bld: 94 mg/dL (ref 70–99)
Potassium: 4 mmol/L (ref 3.5–5.1)
Sodium: 133 mmol/L — ABNORMAL LOW (ref 135–145)

## 2023-03-22 LAB — BRAIN NATRIURETIC PEPTIDE: B Natriuretic Peptide: 99.5 pg/mL (ref 0.0–100.0)

## 2023-03-22 MED ORDER — LOSARTAN POTASSIUM 25 MG PO TABS: 25.0000 mg | ORAL_TABLET | Freq: Every day | ORAL | 3 refills | Status: DC

## 2023-03-22 NOTE — Patient Instructions (Addendum)
Thank you for coming in today  If you had labs drawn today, any labs that are abnormal the clinic will call you No news is good news  Medications: START Losartan 12.5 mg 1/2 tablet daily   Follow up appointments: Your physician recommends that you return for lab work in:  1 week BMET   Your physician recommends that you schedule a follow-up appointment in:  2-3 months With Dr. Shirlee Latch     Do the following things EVERYDAY: Weigh yourself in the morning before breakfast. Write it down and keep it in a log. Take your medicines as prescribed Eat low salt foods--Limit salt (sodium) to 2000 mg per day.  Stay as active as you can everyday Limit all fluids for the day to less than 2 liters   At the [facility], you and your health needs are our priority. As part of our continuing mission to provide you with exceptional heart care, we have created designated Provider Care Teams. These Care Teams include your primary Cardiologist (physician) and Advanced Practice Providers (APPs- Physician Assistants and Nurse Practitioners) who all work together to provide you with the care you need, when you need it.   You may see any of the following providers on your designated Care Team at your next follow up: Dr Arvilla Meres Dr Marca Ancona Dr. Marcos Eke, NP Robbie Lis, [place] [facility] [place], [place] Brynda Peon, NP Karle Plumber, PharmD   Please be sure to bring in all your medications bottles to every appointment.    Thank you for choosing [facility]  If you have any questions or concerns before your next appointment please send [place] a message through [place] or call our office at [phone].    TO LEAVE A MESSAGE FOR THE NURSE SELECT OPTION 2, PLEASE LEAVE A MESSAGE INCLUDING: YOUR NAME DATE OF BIRTH CALL BACK NUMBER REASON FOR CALL**this is important as we prioritize the call backs  YOU  WILL RECEIVE A CALL BACK THE SAME DAY AS LONG AS YOU CALL BEFORE 4:00 PM

## 2023-03-22 NOTE — Progress Notes (Unsigned)
03/25/2023 Earley Brooke [id number] 01/08/1927  Referring provider: Georgianne Fick, MD Primary GI doctor: Dr. Leonides Schanz  ASSESSMENT AND PLAN:  IDA, on plavix CBC 02/28/2023   HGB 11.9 MCV 93.2 Platelets 136 Anemia 01/30/2023  Iron 53 Ferritin 7.1 B12 492 Colon 2010, barium swallow normal, recent normal CT AB and pelvis No overt GI bleeding Will check hemoccult cards off iron, reminded to send back Will recheck iron and ferritin today after on iron for a month.  Patient higher risk for procedures due to age, CHF, CAD, but he drives, does his own ADLs and is taking care of his son.  He states he would be willing to proceed with EGD/Colon if needed, would need to be done at the hospital, will discuss with Dr. Leonides Schanz.   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 Possible it is from his severe kyphosis Dysphagia information given to him  History of adenomatous polyp of colon 05/2009 colonoscopy one adenomatous polyp  PAD (peripheral artery disease) (HCC) On plavix   Chronic systolic CHF (congestive heart failure) S/p AICD  Patient Care Team: Georgianne Fick, MD as PCP - General (Internal Medicine) Thurmon Fair, MD as Attending Physician (Cardiology) Laurey Morale, MD as Consulting Physician (Cardiology) Cindee Salt, MD as Consulting Physician (Orthopedic Surgery) Marinus Maw, MD as Consulting Physician (Cardiology) Helane Gunther, DPM as Consulting Physician (Podiatry) Stacey Drain, MD as Consulting Physician (Rheumatology)  HISTORY OF PRESENT ILLNESS: 89 y.o. male with a past medical history of CAD PAD status post right CEA, hypertension dyslipidemia AAA status post stent 2010, GERD, systolic heart failure echo 11/08/2022 EF 45 to 50% mild MR no aortic stenosis and others listed below presents for evaluation of  GERD.   Use to see [facility], Dr. Thurston Hole.  He has pathology report from colonoscopy 05/15/2004 hyperplastic polyp, and colonoscopy 05/2009 adenomatous polyp, no high grade dysplasia.  He is on plavix daily for history of CAD/CEA.   01/30/2023 office visit with myself for dysphagia, GERD, anemia 02/06/2023 barium swallow showed mild esophageal dysmotility, small hiatal hernia, no evidence of GERD or esophageal stricture.  No stricture or masses. 01/25/2023 CT chest/AB/pelvis with contrast for MCV, diverticulosis, no inflammation/wall thickening, normal pancrease, normal liver,  Patient had normal folate, normal B12 did show low normal iron, decreased saturations at 16, ferritin at 7.   Given Hemoccult cards instructed to get on iron with vitamin C. He has been on iron for 30 days, he has not done the hemoccult cards.  He has had dark stools since being on the iron, he states if he has large BM that is hard, has seen intermittent very small volume BRB on TP.  He is on mobic as needed, 3 days a week.  He has occ GERD but states well controlled with protonix 40 mg daily, has not increased to twice a day.  He has had some lower AB aching.  He denies nausea, vomiting.  He has BM most every day, occ hard stools and can have rectal pain with pushing but rare.  He has lost about 8 lbs, states not eating well with son in hospital, has lost about 8 lbs in the last 1-2 months.  He reports ETOH use, tall boy beer or small glass of wine 4-5 times a week. Former smoker, no drug use.  He lives by himself, he drives still, he  does his own ADLS.   He has had an anemia since Sept, he is on folic acid due to methotrexate use for Ankylosing spondylitis.  His son has been in the hospital for 3 weeks, was at [facility]	[place] but now he is at [facility]. He has been sleeping at the hospital with him. Son's name is Theme park manager.   Wt Readings from Last 3 Encounters:  03/25/23 152 lb (68.9 kg)   03/22/23 160 lb 6.4 oz (72.8 kg)  03/05/23 156 lb 9.6 oz (71 kg)    02/10/2019 CT abdomen pelvis secondary to fall shows small hiatal hernia, diverticulosis, prior aortic stent graft repair of AAA, small bilateral renal cyst Labs reviewed from 07/09/2022 show normocytic anemia Hgb 12.6, MCV 98, thrombocytopenia platelets 144, no leukocytosis.  Wt Readings from Last 6 Encounters:  03/25/23 152 lb (68.9 kg)  03/22/23 160 lb 6.4 oz (72.8 kg)  03/05/23 156 lb 9.6 oz (71 kg)  02/28/23 161 lb 9.6 oz (73.3 kg)  02/26/23 157 lb (71.2 kg)  02/19/23 158 lb (71.7 kg)     He  reports that he quit smoking about 45 years ago. His smoking use included cigarettes. He has a 60.00 pack-year smoking history. He has never been exposed to tobacco smoke. He quit smokeless tobacco use about 38 years ago.  His smokeless tobacco use included chew. He reports current alcohol use of about 7.0 standard drinks of alcohol per week. He reports that he does not use drugs.  RELEVANT LABS AND IMAGING: CBC    Component Value Date/Time   WBC 4.7 02/28/2023 1106   RBC 3.69 (L) 02/28/2023 1106   HGB 11.9 (L) 02/28/2023 1106   HCT 34.4 (L) 02/28/2023 1106   PLT 136 (L) 02/28/2023 1106   MCV 93.2 02/28/2023 1106   MCH 32.2 02/28/2023 1106   MCHC 34.6 02/28/2023 1106   RDW 14.1 02/28/2023 1106   LYMPHSABS 1.5 01/30/2023 1011   MONOABS 0.5 01/30/2023 1011   EOSABS 0.1 01/30/2023 1011   BASOSABS 0.0 01/30/2023 1011   Recent Labs    07/09/22 0958 01/25/23 2012 01/25/23 2057 01/30/23 1011 02/28/23 1106  HGB 12.6* 12.4* 12.6* 12.3* 11.9*     CMP     Component Value Date/Time   NA 133 (L) 03/22/2023 1050   K 4.0 03/22/2023 1050   CL 100 03/22/2023 1050   CO2 27 03/22/2023 1050   GLUCOSE 94 03/22/2023 1050   BUN 13 03/22/2023 1050   CREATININE 0.77 03/22/2023 1050   CREATININE 1.11 10/09/2013 1307   CALCIUM 9.1 03/22/2023 1050   PROT 7.3 10/23/2013 1127   ALBUMIN 4.0 10/23/2013 1127   AST 25 10/23/2013  1127   ALT 27 10/23/2013 1127   ALKPHOS 70 10/23/2013 1127   BILITOT 0.4 10/23/2013 1127   GFRNONAA >60 03/22/2023 1050   GFRAA 57 (L) 05/30/2020 1144      Latest Ref Rng & Units 10/23/2013   11:27 AM 04/15/2009    2:00 PM 02/22/2009    2:04 PM  Hepatic Function  Total Protein 6.0 - 8.3 g/dL 7.3  7.6  7.0   Albumin 3.5 - 5.2 g/dL 4.0  3.4  3.9   AST 0 - 37 U/L 25  42  31   ALT 0 - 53 U/L 27  59  27   Alk Phosphatase 39 - 117 U/L 70  95  64   Total Bilirubin 0.3 - 1.2 mg/dL 0.4  0.5  0.8   Bilirubin, Direct 0.0 -  0.3 mg/dL   0.3       Current Medications:    Current Outpatient Medications (Cardiovascular):    atorvastatin (LIPITOR) 40 MG tablet, Take 1 tablet (40 mg total) by mouth daily at 6 PM.   carvedilol (COREG) 3.125 MG tablet, Take 3 tablets (9.375 mg total) by mouth 2 (two) times daily with a meal.   furosemide (LASIX) 20 MG tablet, Take 1 tablet (20 mg total) by mouth as needed for fluid or edema (for a weight gain of 3-5 in a week.).   losartan (COZAAR) 25 MG tablet, Take 1 tablet (25 mg total) by mouth daily.   nitroGLYCERIN (NITROSTAT) 0.4 MG SL tablet, PLACE 1 TABLET UNDER THE TONGUE IF NEEDED EVERY 5 MINUTES FOR CHEST PAIN FOR 3 DOSES IF NO RELIEF AFTER FIRST DOSE CALL PRESCRIBER OR 911   spironolactone (ALDACTONE) 25 MG tablet, TAKE 1 TABLET(25 MG) BY MOUTH DAILY  Current Outpatient Medications (Respiratory):    fluticasone (FLONASE) 50 MCG/ACT nasal spray, Place 1 spray into both nostrils daily as needed for allergies.   Current Outpatient Medications (Analgesics):    acetaminophen (TYLENOL) 500 MG tablet, Take 1,000 mg by mouth daily as needed for mild pain or headache. Reported on 01/25/2016   meloxicam (MOBIC) 15 MG tablet, Take 15 mg by mouth daily as needed for pain.  Current Outpatient Medications (Hematological):    clopidogrel (PLAVIX) 75 MG tablet, TAKE 1 TABLET(75 MG) BY MOUTH DAILY   folic acid (FOLVITE) 1 MG tablet, Take 1 mg by mouth daily.  Current  Outpatient Medications (Other):    Calcium Carbonate-Vitamin D (CALCIUM 600 + D PO), Take 1 tablet by mouth 2 (two) times daily.   clotrimazole (LOTRIMIN) 1 % cream, Apply 1 Application topically as needed.   finasteride (PROSCAR) 5 MG tablet, take 1 tablet by mouth once daily   gabapentin (NEURONTIN) 300 MG capsule, Take 300 mg by mouth 2 (two) times daily.   meclizine (ANTIVERT) 25 MG tablet, Take 1 tablet (25 mg total) by mouth 2 (two) times daily as needed for dizziness.   methotrexate (RHEUMATREX) 2.5 MG tablet, Take 17.5 mg by mouth once a week. Caution:Chemotherapy. Protect from light.7 tablets   mometasone (ELOCON) 0.1 % cream, Apply 1 application topically daily as needed (for irritation).    Multiple Vitamin (MULTIVITAMIN PO), Take 1 tablet by mouth daily. ALIVE men's vitamin   pantoprazole (PROTONIX) 40 MG tablet, Take 40 mg by mouth daily.   Secukinumab (COSENTYX SENSOREADY PEN) 150 MG/ML SOAJ, Inject 150 mg into the skin every 28 (twenty-eight) days.   sulfaSALAzine (AZULFIDINE) 500 MG tablet, Take 1,500 mg by mouth 2 (two) times daily.  Medical History:  Past Medical History:  Diagnosis Date   AAA (abdominal aortic aneurysm) (HCC)    Abnormal stress test    Allergic rhinitis    uses Flonase daily   Anginal pain (HCC)    pain in neck,shoulders,down arms occ   Arthritis    Cancer (HCC)    skin   Carotid artery occlusion    Chronic venous insufficiency    Coronary artery disease    Dizziness    takes Antivert daily as needed   ED (erectile dysfunction)    GERD (gastroesophageal reflux disease)    takes Omeprazole daily   Hiatal hernia    Hx of echocardiogram 04/04/2010   showed a borderline dilatd left ventricle with mild left ventricle hypertrophy and an EF 50-55% doppler suggested diastolic dysfunction, although the pattern is probably normal for  an 89 yrs old. The left atrium was indeed mildly dilated at 42 mm, but there are no significant valvular abnormalities.     Hyperlipidemia    Hypertension    takes Proscar,Dyazide,Avapro,and Metoprolol daily   Myocardial infarction [facility]) Oct. 11, 2016   Heart Attack   Palpitation    Shortness of breath    Sigmoid diverticulosis    T12 compression fracture (HCC)    Thyroid disease    Allergies:  Allergies  Allergen Reactions   Doxepin Other (See Comments)    hallucinations  hallucinations Hallucinations  hallucinations Hallucinations hallucinations Hallucinations   Sinequan [Doxepin Hcl] Other (See Comments)    hallucinations   Meloxicam Other (See Comments)   Verapamil Other (See Comments)    Caused heart to race Caused heart to race  Caused heart to race Caused heart to race   Procaine Itching and Other (See Comments)    Tingling all over as soon as it was injected  Tingling all over as soon as it was injected Tingling all over as soon as it was injected     Surgical History:  He  has a past surgical history that includes Rotator cuff repair (2003); Abdominal aortic aneurysm repair (02/24/2009); Hernia repair (Bilateral); Hand surgery (Right); Endarterectomy (Right, 10/27/2013); melanoma removed; left heart catheterization with coronary angiogram (N/A, 10/12/2013); Cardiac catheterization (N/A, 08/04/2015); Cardiac catheterization (N/A, 08/04/2015); Cardiac catheterization (N/A, 08/08/2015); and Cardiac catheterization (N/A, 01/26/2016). Family History:  His family history includes COPD in his mother; Diabetes in his mother; Heart disease in his mother; Hyperlipidemia in his mother; Hypertension in his mother.  REVIEW OF SYSTEMS  : All other systems reviewed and negative except where noted in the History of Present Illness.  PHYSICAL EXAM: BP 130/72   Pulse 62   Ht 5\' 10"  (1.778 m)   Wt 152 lb (68.9 kg)   BMI 21.81 kg/m  General Appearance: elderly but well appearing male in no apparent distress. Head:   Normocephalic and atraumatic. Severe cervical kyphosis and limited neck ROM Eyes:   sclerae anicteric,conjunctive pink  Respiratory: Respiratory effort normal, BS equal bilaterally without rales, rhonchi, wheezing. Cardio: RRR with no MRGs. Peripheral pulses intact.  Abdomen: Soft,  Obese ,active bowel sounds. No tenderness . Without guarding and Without rebound. No masses. Rectal: declines Musculoskeletal: Full ROM, Antalgic gait but able to get on table with assistance. Without edema. Skin:  Dry and intact without significant lesions or rashes Neuro: Alert and  oriented x4;  No focal deficits. Psych:  Cooperative. Normal mood and affect.    Doree Albee, PA-C 2:45 PM

## 2023-03-25 ENCOUNTER — Other Ambulatory Visit (INDEPENDENT_AMBULATORY_CARE_PROVIDER_SITE_OTHER): Payer: Medicare Other

## 2023-03-25 ENCOUNTER — Ambulatory Visit: Payer: Medicare Other | Admitting: Physician Assistant

## 2023-03-25 ENCOUNTER — Encounter: Payer: Self-pay | Admitting: Physician Assistant

## 2023-03-25 VITALS — BP 130/72 | HR 62 | Ht 70.0 in | Wt 152.0 lb

## 2023-03-25 DIAGNOSIS — M459 Ankylosing spondylitis of unspecified sites in spine: Secondary | ICD-10-CM

## 2023-03-25 DIAGNOSIS — Z860101 Personal history of adenomatous and serrated colon polyps: Secondary | ICD-10-CM

## 2023-03-25 DIAGNOSIS — Z86010 Personal history of colonic polyps: Secondary | ICD-10-CM

## 2023-03-25 DIAGNOSIS — D509 Iron deficiency anemia, unspecified: Secondary | ICD-10-CM | POA: Diagnosis not present

## 2023-03-25 DIAGNOSIS — I5022 Chronic systolic (congestive) heart failure: Secondary | ICD-10-CM

## 2023-03-25 DIAGNOSIS — K219 Gastro-esophageal reflux disease without esophagitis: Secondary | ICD-10-CM

## 2023-03-25 DIAGNOSIS — I739 Peripheral vascular disease, unspecified: Secondary | ICD-10-CM

## 2023-03-25 LAB — CBC WITH DIFFERENTIAL/PLATELET
Basophils Absolute: 0 10*3/uL (ref 0.0–0.1)
Basophils Relative: 0.4 % (ref 0.0–3.0)
Eosinophils Absolute: 0.1 10*3/uL (ref 0.0–0.7)
Eosinophils Relative: 1.6 % (ref 0.0–5.0)
HCT: 37.8 % — ABNORMAL LOW (ref 39.0–52.0)
Hemoglobin: 12.6 g/dL — ABNORMAL LOW (ref 13.0–17.0)
Lymphocytes Relative: 35.2 % (ref 12.0–46.0)
Lymphs Abs: 1.5 10*3/uL (ref 0.7–4.0)
MCHC: 33.2 g/dL (ref 30.0–36.0)
MCV: 96.9 fl (ref 78.0–100.0)
Monocytes Absolute: 0.5 10*3/uL (ref 0.1–1.0)
Monocytes Relative: 12.2 % — ABNORMAL HIGH (ref 3.0–12.0)
Neutro Abs: 2.2 10*3/uL (ref 1.4–7.7)
Neutrophils Relative %: 50.6 % (ref 43.0–77.0)
Platelets: 158 10*3/uL (ref 150.0–400.0)
RBC: 3.9 Mil/uL — ABNORMAL LOW (ref 4.22–5.81)
RDW: 16.6 % — ABNORMAL HIGH (ref 11.5–15.5)
WBC: 4.4 10*3/uL (ref 4.0–10.5)

## 2023-03-25 LAB — COMPREHENSIVE METABOLIC PANEL
ALT: 15 U/L (ref 0–53)
AST: 23 U/L (ref 0–37)
Albumin: 4 g/dL (ref 3.5–5.2)
Alkaline Phosphatase: 53 U/L (ref 39–117)
BUN: 14 mg/dL (ref 6–23)
CO2: 29 mEq/L (ref 19–32)
Calcium: 9.2 mg/dL (ref 8.4–10.5)
Chloride: 100 mEq/L (ref 96–112)
Creatinine, Ser: 0.85 mg/dL (ref 0.40–1.50)
GFR: 74.89 mL/min (ref >60.00)
Glucose, Bld: 101 mg/dL — ABNORMAL HIGH (ref 70–99)
Potassium: 3.8 mEq/L (ref 3.5–5.1)
Sodium: 136 mEq/L (ref 135–145)
Total Bilirubin: 0.5 mg/dL (ref 0.2–1.2)
Total Protein: 6.6 g/dL (ref 6.0–8.3)

## 2023-03-25 LAB — IBC + FERRITIN
Ferritin: 30.2 ng/mL (ref 22.0–322.0)
Iron: 111 ug/dL (ref 42–165)
Saturation Ratios: 39.6 % (ref 20.0–50.0)
TIBC: 280 ug/dL (ref 250.0–450.0)
Transferrin: 200 mg/dL — ABNORMAL LOW (ref 212.0–360.0)

## 2023-03-25 NOTE — Patient Instructions (Addendum)
  Your provider has requested that you go to the basement level for lab work before leaving today. Press "B" on the elevator. The lab is located at the first door on the left as you exit the elevator.  PLEASE RETURN HEMOCCULT CARDS GET THE IRON RECHECKED  WILL DISCUSS WITH DOCTOR DORSEY CAN INCREASE PROTONIX 40 MG TO TWICE A DAY  Dysphagia precautions:  1. Take reflux medications 30+ minutes before food in the morning 2. Begin meals with warm beverage 3. Eat smaller more frequent meals 4. Eat slowly, taking small bites and sips 5. Alternate solids and liquids 6. Avoid foods/liquids that increase acid production 7. Sit upright during and for 30+ minutes after meals to facilitate esophageal clearing 8. All meats should be chopped finely.   If something gets hung in your esophagus and will not come up or go down, proceed to the emergency room.    _______________________________________________________  If your blood pressure at your visit was 140/90 or greater, please contact your primary care physician to follow up on this.  _______________________________________________________  If you are age 71 or older, your body mass index should be between 23-30. Your There is no height or weight on file to calculate BMI. If this is out of the aforementioned range listed, please consider follow up with your Primary Care Provider.  If you are age 26 or younger, your body mass index should be between 19-25. Your There is no height or weight on file to calculate BMI. If this is out of the aformentioned range listed, please consider follow up with your Primary Care Provider.   ________________________________________________________  The [place] [facility] providers would like to encourage you to use [facility] to communicate with providers for non-urgent requests or questions.  Due to long hold times on the telephone, sending your provider a message by [facility] may be a faster and more efficient way to get a  response.  Please allow 48 business hours for a response.  Please remember that this is for non-urgent requests.  _______________________________________________________ It was a pleasure to see you today!  Thank you for trusting me with your gastrointestinal care!

## 2023-03-26 NOTE — Progress Notes (Signed)
I agree with the assessment and plan as outlined by Ms. Steffanie Dunn. I am okay with performing EGD/colonoscopy in the hospital for work up of IDA if patient understands the risks/benefits of the procedures.

## 2023-03-29 ENCOUNTER — Ambulatory Visit (HOSPITAL_COMMUNITY)
Admission: RE | Admit: 2023-03-29 | Discharge: 2023-03-29 | Disposition: A | Discharge status: Home / Self Care | Payer: Medicare Other | Source: Ambulatory Visit | Attending: Cardiology | Admitting: Cardiology

## 2023-03-29 DIAGNOSIS — I5022 Chronic systolic (congestive) heart failure: Secondary | ICD-10-CM | POA: Diagnosis not present

## 2023-03-29 LAB — BASIC METABOLIC PANEL
Anion gap: 8 (ref 5–15)
BUN: 15 mg/dL (ref 8–23)
CO2: 27 mmol/L (ref 22–32)
Calcium: 9.3 mg/dL (ref 8.9–10.3)
Chloride: 102 mmol/L (ref 98–111)
Creatinine, Ser: 0.89 mg/dL (ref 0.61–1.24)
GFR, Estimated: >60 mL/min (ref >60)
Glucose, Bld: 97 mg/dL (ref 70–99)
Potassium: 4 mmol/L (ref 3.5–5.1)
Sodium: 137 mmol/L (ref 135–145)

## 2023-04-02 DIAGNOSIS — M8580 Other specified disorders of bone density and structure, unspecified site: Secondary | ICD-10-CM | POA: Diagnosis not present

## 2023-04-02 DIAGNOSIS — I5022 Chronic systolic (congestive) heart failure: Secondary | ICD-10-CM | POA: Diagnosis not present

## 2023-04-02 DIAGNOSIS — M25512 Pain in left shoulder: Secondary | ICD-10-CM | POA: Diagnosis not present

## 2023-04-02 DIAGNOSIS — Z79899 Other long term (current) drug therapy: Secondary | ICD-10-CM | POA: Diagnosis not present

## 2023-04-02 DIAGNOSIS — M459 Ankylosing spondylitis of unspecified sites in spine: Secondary | ICD-10-CM | POA: Diagnosis not present

## 2023-04-19 ENCOUNTER — Telehealth: Payer: Self-pay | Admitting: Physician Assistant

## 2023-04-19 NOTE — Telephone Encounter (Signed)
Inbound call from patient stating he needs another stool test kit. Requesting a call back regarding this. Please advise, thank you.

## 2023-05-06 ENCOUNTER — Telehealth: Payer: Self-pay

## 2023-05-06 NOTE — Telephone Encounter (Signed)
Spoke with the patient. Offered the date of 05/13/23 for EGD and colonoscopy. He wants to complete his hemoccult cards first.

## 2023-05-06 NOTE — Telephone Encounter (Signed)
Pt will need to be added on this week due to has to hold Plavix x 5 days.

## 2023-05-06 NOTE — Telephone Encounter (Signed)
   Name: Henry Neal  DOB: 1927/04/13  MRN: [id number]  Primary Cardiologist: None   Preoperative team, please contact this patient and set up a phone call appointment for further preoperative risk assessment. Please obtain consent and complete medication review. Thank you for your help.  I confirm that guidance regarding antiplatelet and oral anticoagulation therapy has been completed and, if necessary, noted below.  His Plavix may be held for 5 days prior to his procedure.  Please resume as soon as hemostasis is achieved.   Ronney Asters, NP 05/06/2023, 3:41 PM [facility]

## 2023-05-06 NOTE — Telephone Encounter (Signed)
Left message to call back to schedule a tele pre op appt.  ?

## 2023-05-06 NOTE — Telephone Encounter (Signed)
[facility] Pre-operative Risk Assessment     Request for surgical clearance:     Endoscopy Procedure  What type of surgery is being performed?     EGD and colonoscopy  When is this surgery scheduled?     05/13/23  What type of clearance is required ?   Pharmacy  Are there any medications that need to be held prior to surgery and how long? Clopidogrel (Plavix)  Practice name and name of physician performing surgery?      [facility] by Dr Imogene Burn, MD  What is your office phone and fax number?      Phone- [phone]  Fax- [phone]  Anesthesia type (None, local, MAC, general) ?       MAC

## 2023-05-07 NOTE — Telephone Encounter (Signed)
See notes from GI office the pt has decided to not have procedure at this time. Ok to cancel pre op clearance at this time.   I will update the pre op APP as FYI.

## 2023-06-07 ENCOUNTER — Ambulatory Visit (INDEPENDENT_AMBULATORY_CARE_PROVIDER_SITE_OTHER): Payer: Medicare Other

## 2023-06-07 DIAGNOSIS — I441 Atrioventricular block, second degree: Secondary | ICD-10-CM

## 2023-06-07 LAB — CUP PACEART REMOTE DEVICE CHECK
Battery Remaining Longevity: 26 mo
Battery Remaining Percentage: 25 %
Battery Voltage: 2.89 V
Brady Statistic AP VP Percent: 1.7 %
Brady Statistic AP VS Percent: 1 %
Brady Statistic AS VP Percent: 98 %
Brady Statistic AS VS Percent: 1 %
Brady Statistic RA Percent Paced: 1.5 %
Date Time Interrogation Session: 20240816050549
Implantable Lead Connection Status: 753985
Implantable Lead Connection Status: 753985
Implantable Lead Connection Status: 753985
Implantable Lead Implant Date: 20170406
Implantable Lead Implant Date: 20170406
Implantable Lead Implant Date: 20170406
Implantable Lead Location: 753858
Implantable Lead Location: 753859
Implantable Lead Location: 753860
Implantable Pulse Generator Implant Date: 20170406
Lead Channel Impedance Value: 440 Ohm
Lead Channel Impedance Value: 460 Ohm
Lead Channel Impedance Value: 780 Ohm
Lead Channel Pacing Threshold Amplitude: 0.5 V
Lead Channel Pacing Threshold Amplitude: 1 V
Lead Channel Pacing Threshold Amplitude: 1.875 V
Lead Channel Pacing Threshold Pulse Width: 0.4 ms
Lead Channel Pacing Threshold Pulse Width: 0.4 ms
Lead Channel Pacing Threshold Pulse Width: 0.4 ms
Lead Channel Sensing Intrinsic Amplitude: 1.7 mV
Lead Channel Sensing Intrinsic Amplitude: 12 mV
Lead Channel Setting Pacing Amplitude: 2 V
Lead Channel Setting Pacing Amplitude: 2 V
Lead Channel Setting Pacing Amplitude: 2.875
Lead Channel Setting Pacing Pulse Width: 0.4 ms
Lead Channel Setting Pacing Pulse Width: 0.4 ms
Lead Channel Setting Sensing Sensitivity: 2 mV
Pulse Gen Model: 3262
Pulse Gen Serial Number: 7866838

## 2023-06-14 ENCOUNTER — Ambulatory Visit: Payer: Medicare Other | Admitting: Podiatry

## 2023-06-14 ENCOUNTER — Encounter: Payer: Self-pay | Admitting: Podiatry

## 2023-06-14 DIAGNOSIS — M79676 Pain in unspecified toe(s): Secondary | ICD-10-CM

## 2023-06-14 DIAGNOSIS — B351 Tinea unguium: Secondary | ICD-10-CM

## 2023-06-14 DIAGNOSIS — I739 Peripheral vascular disease, unspecified: Secondary | ICD-10-CM | POA: Diagnosis not present

## 2023-06-14 NOTE — Progress Notes (Signed)
This patient returns to my office for at risk foot care.  This patient requires this care by a professional since this patient will be at risk due to having renal insufficiency coagulation defect and PAD.   Patient is taking plavix.    This patient is unable to cut nails himself since the patient cannot reach his nails.These nails are painful walking and wearing shoes.  This patient presents for at risk foot care today.  General Appearance  Alert, conversant and in no acute stress.  Vascular  Dorsalis pedis and posterior tibial  pulses are weakly  palpable  bilaterally.  Capillary return is within normal limits  bilaterally. Temperature is within normal limits  bilaterally.  Neurologic  Senn-Weinstein monofilament wire test within normal limits  bilaterally. Muscle power within normal limits bilaterally.  Nails Thick disfigured discolored nails with subungual debris  from hallux to fifth toes bilaterally. No evidence of bacterial infection or drainage bilaterally.  Orthopedic  No limitations of motion  feet .  No crepitus or effusions noted.  No bony pathology or digital deformities noted.  Skin  normotropic skin with no porokeratosis noted bilaterally.  No signs of infections or ulcers noted.     Onychomycosis  Pain in right toes  Pain in left toes  Consent was obtained for treatment procedures.   Mechanical debridement of nails 1-5  bilaterally performed with a nail nipper.  Filed with dremel without incident.    Return office visit   10 weeks                   Told patient to return for periodic foot care and evaluation due to potential at risk complications. Crest pad made for right toes.   Helane Gunther DPM

## 2023-06-17 NOTE — Progress Notes (Signed)
Remote pacemaker transmission.   

## 2023-06-19 ENCOUNTER — Other Ambulatory Visit (HOSPITAL_COMMUNITY): Payer: Self-pay | Admitting: Cardiology

## 2023-06-20 ENCOUNTER — Ambulatory Visit (HOSPITAL_COMMUNITY)
Admission: RE | Admit: 2023-06-20 | Discharge: 2023-06-20 | Disposition: A | Discharge status: Home / Self Care | Payer: Medicare Other | Source: Ambulatory Visit | Attending: Cardiology | Admitting: Cardiology

## 2023-06-20 ENCOUNTER — Encounter (HOSPITAL_COMMUNITY): Payer: Self-pay | Admitting: Cardiology

## 2023-06-20 VITALS — BP 108/70 | HR 55 | Ht 70.0 in | Wt 158.4 lb

## 2023-06-20 DIAGNOSIS — G8929 Other chronic pain: Secondary | ICD-10-CM | POA: Diagnosis not present

## 2023-06-20 DIAGNOSIS — I255 Ischemic cardiomyopathy: Secondary | ICD-10-CM | POA: Insufficient documentation

## 2023-06-20 DIAGNOSIS — I252 Old myocardial infarction: Secondary | ICD-10-CM | POA: Diagnosis not present

## 2023-06-20 DIAGNOSIS — Z955 Presence of coronary angioplasty implant and graft: Secondary | ICD-10-CM | POA: Insufficient documentation

## 2023-06-20 DIAGNOSIS — I5022 Chronic systolic (congestive) heart failure: Secondary | ICD-10-CM | POA: Diagnosis not present

## 2023-06-20 DIAGNOSIS — M459 Ankylosing spondylitis of unspecified sites in spine: Secondary | ICD-10-CM | POA: Insufficient documentation

## 2023-06-20 DIAGNOSIS — I2510 Atherosclerotic heart disease of native coronary artery without angina pectoris: Secondary | ICD-10-CM | POA: Insufficient documentation

## 2023-06-20 DIAGNOSIS — Z79631 Long term (current) use of antimetabolite agent: Secondary | ICD-10-CM | POA: Diagnosis not present

## 2023-06-20 DIAGNOSIS — I501 Left ventricular failure: Secondary | ICD-10-CM | POA: Diagnosis not present

## 2023-06-20 DIAGNOSIS — I6529 Occlusion and stenosis of unspecified carotid artery: Secondary | ICD-10-CM | POA: Insufficient documentation

## 2023-06-20 LAB — COMPREHENSIVE METABOLIC PANEL
ALT: 22 U/L (ref 0–44)
AST: 28 U/L (ref 15–41)
Albumin: 3.4 g/dL — ABNORMAL LOW (ref 3.5–5.0)
Alkaline Phosphatase: 62 U/L (ref 38–126)
Anion gap: 8 (ref 5–15)
BUN: 11 mg/dL (ref 8–23)
CO2: 27 mmol/L (ref 22–32)
Calcium: 8.6 mg/dL — ABNORMAL LOW (ref 8.9–10.3)
Chloride: 100 mmol/L (ref 98–111)
Creatinine, Ser: 0.83 mg/dL (ref 0.61–1.24)
GFR, Estimated: >60 mL/min (ref >60)
Glucose, Bld: 95 mg/dL (ref 70–99)
Potassium: 3.4 mmol/L — ABNORMAL LOW (ref 3.5–5.1)
Sodium: 135 mmol/L (ref 135–145)
Total Bilirubin: 0.7 mg/dL (ref 0.3–1.2)
Total Protein: 5.8 g/dL — ABNORMAL LOW (ref 6.5–8.1)

## 2023-06-20 LAB — BRAIN NATRIURETIC PEPTIDE: B Natriuretic Peptide: 80.2 pg/mL (ref 0.0–100.0)

## 2023-06-20 MED ORDER — DAPAGLIFLOZIN PROPANEDIOL 10 MG PO TABS
10.0000 mg | ORAL_TABLET | Freq: Every day | ORAL | 3 refills | Status: DC
Start: 2023-06-20 — End: 2024-06-12

## 2023-06-20 NOTE — Patient Instructions (Signed)
START Farxiga 10 mg Daily  Labs done today, your results will be available in MyChart, we will contact you for abnormal readings.  Your physician recommends that you schedule a follow-up appointment in: 2 months  Do the following things EVERYDAY: Weigh yourself in the morning before breakfast. Write it down and keep it in a log. Take your medicines as prescribed Eat low salt foods--Limit salt (sodium) to 2000 mg per day.  Stay as active as you can everyday Limit all fluids for the day to less than 2 liters  At the [facility], you and your health needs are our priority. As part of our continuing mission to provide you with exceptional heart care, we have created designated Provider Care Teams. These Care Teams include your primary Cardiologist (physician) and Advanced Practice Providers (APPs- Physician Assistants and Nurse Practitioners) who all work together to provide you with the care you need, when you need it.   You may see any of the following providers on your designated Care Team at your next follow up: Dr Arvilla Meres Dr Marca Ancona Dr. Marcos Eke, NP Robbie Lis, [place] [employer] [place], [place] Brynda Peon, NP Karle Plumber, PharmD   Please be sure to bring in all your medications bottles to every appointment.    Thank you for choosing [facility]   If you have any questions or concerns before your next appointment please send [place] a message through [place] or call our office at [phone].    TO LEAVE A MESSAGE FOR THE NURSE SELECT OPTION 2, PLEASE LEAVE A MESSAGE INCLUDING: YOUR NAME DATE OF BIRTH CALL BACK NUMBER REASON FOR CALL**this is important as we prioritize the call backs  YOU WILL RECEIVE A CALL BACK THE SAME DAY AS LONG AS YOU CALL BEFORE 4:00 PM

## 2023-06-20 NOTE — Progress Notes (Signed)
Patient ID: Henry Neal, male   DOB: [birth year], 89 y.o.   MRN: [id number]     Advanced Heart Failure Clinic Note  PCP: Dr. 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 male with history of CAD, chronic systolic CHF, LBBB, AAA s/p repair presents for followup of CHF and CAD.  He had a cardiac cath in 12/15, showing total occlusion of the LAD and 50-60% proximal RCA.  EF at that time was near-normal.  In 10/16, he developed dyspnea and epigastric pain.  He was noted to be in acute pulmonary edema and was diuresed.  Troponin was only mildly elevated at 0.47.  He ultimately underwent LHC showing hazy 80-90% RCA stenosis which was likely the culprit for his presentation.  Additionally, the LAD was only subtotally occluded on this cath.  He had DES to RCA.  Cardiac MRI was done, showing EF 22% with significant viability in LAD territory.  Therefore, CTO of LAD was opened with DES.  Echo in 2/17 showed EF persistently low at 15%.  He had St Jude CRT-P placement in 4/17. Echo in 1/19 showed EF up to 45-50% with mild MR.    Echo 3/20 showed EF 45% with mild diffuse hypokinesis, mildly decreased RV systolic function. Echo in 3/21 showed EF 45-50%, mild RV dilation with normal systolic function. Echo in 10/22 showed EF 50-55%, normal RV, mild MR.    He was diagnosed with ankylosing spondylitis and is now on methotrexate.    Echo 1/24 EF 45-50%, mild RV dysfunction.   Patient returns for followup of CHF.  He is getting more unsteady, likely due to kyposis, and is using a cane or walker.  He has been walking a lot recently, visiting his son who has been hospitalized with pancreatitis for several weeks.  No dyspnea walking on flat ground.  Occasional atypical chest pain, nothing exertional.  No orthopnea/PND.  Weight down 2 lbs.   St Jude device interrogation: >99% BiV pacing, no AF, recently decreased thoracic impedance now trending back up.   Labs (1/18): LDL 37 Labs (01/25/2017): K 3.5  Creatinine 0.96  Labs (8/18): BNP 30, K 3.7, creatinine 1.0 Labs (11/18): LDL 40, HDL 47, K 3.5, creatinine 4.09  Labs (3/19): K 4, creatinine 0.97 Labs (11/19): K 3.9, creatinine 1.2, LDL 44 Labs (4/20): K 4.1, creatinine 1.11 Labs (10/20): LDL 46 Labs (11/20): K 4.4, creatinine 1.05 Labs (3/21): K 4.2, creatinine 0.92 Labs (7/21): LDL 58 Labs (8/21): K 4, creatinine 1.27 Labs (11/21): K 3.9, creatinine 0.88 Labs (9/22): K 3.6, creatinine 0.82, LDL 44 Labs (6/23): K 4.6, creatinine 0.86 Labs (9/23): K 4.5, creatinine 0.97, LDL 49, HDL 68 Labs (4/24): K 3.9, SCr 0.80, tSat 16, ferritin 7.1 Labs (5/24): K 3.9, SCr .96, LDL 41 Labs (6/24): K 4, creatinine 0.89   PMH: 1. CKD 2. AAA s/p repair, followed at [facility]. 3. Carotid stenosis: s/p right CEA.   Carotid dopplers (2/16) with right CEA stable, < 40% LICA stenosis.  Followed at [facility].  - Carotid dopplers (3/18): 1-39% LICA, patent right CEA.  4. Chronic LBBB 5. LAD: LHC (12/15) with totally occluded LAD, 50-60% pRCA => medically managed.  10/16 admitted with NSTEMI.  LHC with subtotalled LAD occlusion, 80-90% pRCA with thrombus => DES RCA initially.  Cardiac MRI was done showing viability in the LAD territory, so he had DES to the CTO LAD.   6. Chronic systolic CHF: Ischemic cardiomyopathy.  Echo (10/16) with EF 15% with  wall motion abnormalities, moderately dilated RV with normal systolic function.  CMRI (10/16) with severe LV dilation, EF 22% with septal-lateral dyssynchrony, no LV thrombus, normal RV size/systolic function => significant viability noted in the LAD territory.  Echo (2/17): EF 15%, severe LV dilation, wall motion abnormalities noted, mild MR, mildly dilated RV with normal systolic function, PASP 55 mmHg.  St Jude CRT-P 4/17.  - Echo (1/18): EF 50-55%, mild to moderate MR.  - Echo (1/19): EF 45-50%, mild LVH, inferior hypokinesis, mild MR, PASP 30 mmHg.  - Echo (3/20): EF 45%, diffuse hypokinesis, normal RV size with mildly  decreased systolic function.  - Echo (3/21): EF 45-50%, mildly dilated RV with normal systolic function.  - Echo (10/22): EF 50-55%, normal RV, mild MR - Echo (1/24): EF 45-50%, mild RV dysfunction 7. ABIs (10/16) were normal.  8. Raynaud's syndrome.  9. Sciatica  SH: Lives alone, prior smoker, still working as a [employer] certified accountant.  FH: CAD  Review of systems complete and found to be negative unless listed in HPI.    Current Outpatient Medications  Medication Sig Dispense Refill   acetaminophen (TYLENOL) 500 MG tablet Take 1,000 mg by mouth daily as needed for mild pain or headache. Reported on 01/25/2016     atorvastatin (LIPITOR) 40 MG tablet Take 1 tablet (40 mg total) by mouth daily at 6 PM. 30 tablet 6   Calcium Carbonate-Vitamin D (CALCIUM 600 + D PO) Take 1 tablet by mouth 2 (two) times daily.     carvedilol (COREG) 3.125 MG tablet Take 3 tablets (9.375 mg total) by mouth 2 (two) times daily with a meal. (Patient taking differently: Take 9.375 mg by mouth 2 (two) times daily with a meal. Taking 12.5 mg twice daily) 180 tablet 3   clopidogrel (PLAVIX) 75 MG tablet TAKE 1 TABLET(75 MG) BY MOUTH DAILY 90 tablet 1   clotrimazole (LOTRIMIN) 1 % cream Apply 1 Application topically as needed.     dapagliflozin propanediol (FARXIGA) 10 MG TABS tablet Take 1 tablet (10 mg total) by mouth daily before breakfast. 90 tablet 3   finasteride (PROSCAR) 5 MG tablet take 1 tablet by mouth once daily     fluticasone (FLONASE) 50 MCG/ACT nasal spray Place 1 spray into both nostrils daily as needed for allergies.      folic acid (FOLVITE) 1 MG tablet Take 1 mg by mouth daily.     furosemide (LASIX) 20 MG tablet Take 1 tablet (20 mg total) by mouth as needed for fluid or edema (for a weight gain of 3-5 in a week.). 15 tablet 6   gabapentin (NEURONTIN) 300 MG capsule Take 300 mg by mouth 2 (two) times daily.  0   losartan (COZAAR) 25 MG tablet Take 1 tablet (25 mg total) by mouth daily. 90 tablet 3   meclizine  (ANTIVERT) 25 MG tablet Take 1 tablet (25 mg total) by mouth 2 (two) times daily as needed for dizziness. 30 tablet 0   meloxicam (MOBIC) 15 MG tablet Take 15 mg by mouth daily as needed for pain. Pt taking as needed  0   mometasone (ELOCON) 0.1 % cream Apply 1 application topically daily as needed (for irritation).      Multiple Vitamin (MULTIVITAMIN PO) Take 1 tablet by mouth daily. ALIVE men's vitamin     pantoprazole (PROTONIX) 40 MG tablet Take 40 mg by mouth daily.     Secukinumab (COSENTYX SENSOREADY PEN) 150 MG/ML SOAJ Inject 150 mg into the skin every 28 (  twenty-eight) days.     spironolactone (ALDACTONE) 25 MG tablet TAKE 1 TABLET(25 MG) BY MOUTH DAILY 90 tablet 3   sulfaSALAzine (AZULFIDINE) 500 MG tablet Take 1,500 mg by mouth 2 (two) times daily.     methotrexate (RHEUMATREX) 2.5 MG tablet Take 17.5 mg by mouth once a week. Caution:Chemotherapy. Protect from light.7 tablets (Patient not taking: Reported on 06/20/2023)     nitroGLYCERIN (NITROSTAT) 0.4 MG SL tablet PLACE 1 TABLET UNDER THE TONGUE IF NEEDED EVERY 5 MINUTES FOR CHEST PAIN FOR 3 DOSES IF NO RELIEF AFTER FIRST DOSE CALL PRESCRIBER OR 911 (Patient not taking: Reported on 06/20/2023) 25 tablet 3   No current facility-administered medications for this encounter.   BP 108/70   Pulse (!) 55   Ht 5\' 10"  (1.778 m)   Wt 71.8 kg (158 lb 6.4 oz)   SpO2 95%   BMI 22.73 kg/m   Wt Readings from Last 3 Encounters:  06/20/23 71.8 kg (158 lb 6.4 oz)  03/25/23 68.9 kg (152 lb)  03/22/23 72.8 kg (160 lb 6.4 oz)    Physical exam:  General: NAD, kyphotic.  Neck: No JVD, no thyromegaly or thyroid nodule.  Lungs: Clear to auscultation bilaterally with normal respiratory effort. CV: Nondisplaced PMI.  Heart regular S1/S2, no S3/S4, no murmur.  1+ edema 1/2 to knees.  No carotid bruit.  Normal pedal pulses.  Abdomen: Soft, nontender, no hepatosplenomegaly, no distention.  Skin: Intact without lesions or rashes.  Neurologic: Alert and  oriented x 3.  Psych: Normal affect. Extremities: No clubbing or cyanosis.  HEENT: Normal.   Assessment/Plan: 1. Chronic HF with mid range EF: Ischemic cardiomyopathy. Echo in 3/21 showed EF 45-50%, improved since getting CRT.  Echo 10/22 showed EF 50-55%.  Echo 1/24 EF 45-50%.  St Jude CRT-D.  NYHA class II. Mild volume overload by [place], not impressive volume on exam.    - Continue Lasix prn, will use Farxiga 10 mg daily for mild volume overload.  BMET/BNP today, BMET 10 days.  - Continue Coreg 12.5 mg bid  - Continue spironolactone 25 mg daily.   - Continue losartan 25 mg daily, had a lot of dizziness with Entresto.  2. CAD: NSTEMI with DES to RCA (culprit vessel) and subsequent DES to CTO of LAD (cMRI showed viability in the LAD distribution) in 10/16. Atypical chest pain, suspect not ischemic.  - Continue Plavix - Atorvastatin 40 daily. Good lipids in 5/24.  3. Carotid stenosis: Minimal carotid stenosis on 9/22 dopplers.   4. Ankylosing spondylitis: Chronic back pain. He is on MTX.    Followup 2 months.   Marca Ancona, MD  06/20/2023

## 2023-06-28 ENCOUNTER — Encounter (HOSPITAL_COMMUNITY): Payer: Medicare Other | Admitting: Cardiology

## 2023-06-28 DIAGNOSIS — R7303 Prediabetes: Secondary | ICD-10-CM | POA: Diagnosis not present

## 2023-06-28 DIAGNOSIS — M459 Ankylosing spondylitis of unspecified sites in spine: Secondary | ICD-10-CM | POA: Diagnosis not present

## 2023-06-28 DIAGNOSIS — I5022 Chronic systolic (congestive) heart failure: Secondary | ICD-10-CM | POA: Diagnosis not present

## 2023-06-28 DIAGNOSIS — Z79899 Other long term (current) drug therapy: Secondary | ICD-10-CM | POA: Diagnosis not present

## 2023-06-28 DIAGNOSIS — E782 Mixed hyperlipidemia: Secondary | ICD-10-CM | POA: Diagnosis not present

## 2023-06-28 DIAGNOSIS — M8580 Other specified disorders of bone density and structure, unspecified site: Secondary | ICD-10-CM | POA: Diagnosis not present

## 2023-06-28 DIAGNOSIS — Z23 Encounter for immunization: Secondary | ICD-10-CM | POA: Diagnosis not present

## 2023-06-28 DIAGNOSIS — N182 Chronic kidney disease, stage 2 (mild): Secondary | ICD-10-CM | POA: Diagnosis not present

## 2023-07-04 ENCOUNTER — Encounter: Payer: Self-pay | Admitting: Cardiology

## 2023-07-05 DIAGNOSIS — Z23 Encounter for immunization: Secondary | ICD-10-CM | POA: Diagnosis not present

## 2023-07-05 DIAGNOSIS — D692 Other nonthrombocytopenic purpura: Secondary | ICD-10-CM | POA: Diagnosis not present

## 2023-07-05 DIAGNOSIS — I739 Peripheral vascular disease, unspecified: Secondary | ICD-10-CM | POA: Diagnosis not present

## 2023-07-05 DIAGNOSIS — I700 Atherosclerosis of aorta: Secondary | ICD-10-CM | POA: Diagnosis not present

## 2023-07-05 DIAGNOSIS — M459 Ankylosing spondylitis of unspecified sites in spine: Secondary | ICD-10-CM | POA: Diagnosis not present

## 2023-07-10 ENCOUNTER — Other Ambulatory Visit: Payer: Self-pay | Admitting: Physician Assistant

## 2023-07-15 DIAGNOSIS — L570 Actinic keratosis: Secondary | ICD-10-CM | POA: Diagnosis not present

## 2023-07-15 DIAGNOSIS — D225 Melanocytic nevi of trunk: Secondary | ICD-10-CM | POA: Diagnosis not present

## 2023-07-15 DIAGNOSIS — D692 Other nonthrombocytopenic purpura: Secondary | ICD-10-CM | POA: Diagnosis not present

## 2023-07-15 DIAGNOSIS — L821 Other seborrheic keratosis: Secondary | ICD-10-CM | POA: Diagnosis not present

## 2023-07-15 DIAGNOSIS — Z85828 Personal history of other malignant neoplasm of skin: Secondary | ICD-10-CM | POA: Diagnosis not present

## 2023-08-23 ENCOUNTER — Ambulatory Visit: Payer: Medicare Other | Admitting: Podiatry

## 2023-08-23 ENCOUNTER — Other Ambulatory Visit (HOSPITAL_COMMUNITY): Payer: Self-pay

## 2023-08-23 DIAGNOSIS — I5022 Chronic systolic (congestive) heart failure: Secondary | ICD-10-CM

## 2023-08-23 MED ORDER — SPIRONOLACTONE 25 MG PO TABS
25.0000 mg | ORAL_TABLET | Freq: Every day | ORAL | 3 refills | Status: DC
Start: 2023-08-23 — End: 2024-08-10

## 2023-08-29 ENCOUNTER — Other Ambulatory Visit: Payer: Self-pay | Admitting: *Deleted

## 2023-08-29 DIAGNOSIS — Z9889 Other specified postprocedural states: Secondary | ICD-10-CM

## 2023-08-29 DIAGNOSIS — I6523 Occlusion and stenosis of bilateral carotid arteries: Secondary | ICD-10-CM

## 2023-09-02 ENCOUNTER — Encounter: Payer: Self-pay | Admitting: Podiatry

## 2023-09-02 ENCOUNTER — Ambulatory Visit: Payer: Medicare Other | Admitting: Podiatry

## 2023-09-02 DIAGNOSIS — B351 Tinea unguium: Secondary | ICD-10-CM | POA: Diagnosis not present

## 2023-09-02 DIAGNOSIS — I739 Peripheral vascular disease, unspecified: Secondary | ICD-10-CM | POA: Diagnosis not present

## 2023-09-02 DIAGNOSIS — M79676 Pain in unspecified toe(s): Secondary | ICD-10-CM | POA: Diagnosis not present

## 2023-09-02 NOTE — Progress Notes (Signed)
This patient returns to my office for at risk foot care.  This patient requires this care by a professional since this patient will be at risk due to having renal insufficiency coagulation defect and PAD.   Patient is taking plavix.    This patient is unable to cut nails himself since the patient cannot reach his nails.These nails are painful walking and wearing shoes.  This patient presents for at risk foot care today.  General Appearance  Alert, conversant and in no acute stress.  Vascular  Dorsalis pedis and posterior tibial  pulses are weakly  palpable  bilaterally.  Capillary return is within normal limits  bilaterally. Temperature is within normal limits  bilaterally.  Neurologic  Senn-Weinstein monofilament wire test within normal limits  bilaterally. Muscle power within normal limits bilaterally.  Nails Thick disfigured discolored nails with subungual debris  from hallux to fifth toes bilaterally. No evidence of bacterial infection or drainage bilaterally.  Orthopedic  No limitations of motion  feet .  No crepitus or effusions noted.  No bony pathology or digital deformities noted.  Skin  normotropic skin with no porokeratosis noted bilaterally.  No signs of infections or ulcers noted.     Onychomycosis  Pain in right toes  Pain in left toes  Consent was obtained for treatment procedures.   Mechanical debridement of nails 1-5  bilaterally performed with a nail nipper.  Filed with dremel without incident.    Return office visit   10 weeks                   Told patient to return for periodic foot care and evaluation due to potential at risk complications. Crest pads dispensed.   Helane Gunther DPM

## 2023-09-06 ENCOUNTER — Ambulatory Visit (INDEPENDENT_AMBULATORY_CARE_PROVIDER_SITE_OTHER)
Admission: RE | Admit: 2023-09-06 | Discharge: 2023-09-06 | Disposition: A | Discharge status: Home / Self Care | Payer: Medicare Other | Source: Ambulatory Visit | Attending: Physician Assistant

## 2023-09-06 ENCOUNTER — Ambulatory Visit: Payer: Medicare Other | Admitting: Physician Assistant

## 2023-09-06 ENCOUNTER — Ambulatory Visit (HOSPITAL_COMMUNITY)
Admission: RE | Admit: 2023-09-06 | Discharge: 2023-09-06 | Disposition: A | Discharge status: Home / Self Care | Payer: Medicare Other | Source: Ambulatory Visit | Attending: Physician Assistant | Admitting: Physician Assistant

## 2023-09-06 ENCOUNTER — Ambulatory Visit (INDEPENDENT_AMBULATORY_CARE_PROVIDER_SITE_OTHER): Payer: Medicare Other

## 2023-09-06 VITALS — BP 149/67 | HR 72 | Temp 98.3°F | Ht 70.0 in | Wt 156.5 lb

## 2023-09-06 DIAGNOSIS — Z9889 Other specified postprocedural states: Secondary | ICD-10-CM | POA: Diagnosis not present

## 2023-09-06 DIAGNOSIS — Z8679 Personal history of other diseases of the circulatory system: Secondary | ICD-10-CM | POA: Diagnosis not present

## 2023-09-06 DIAGNOSIS — I6523 Occlusion and stenosis of bilateral carotid arteries: Secondary | ICD-10-CM | POA: Diagnosis not present

## 2023-09-06 DIAGNOSIS — I441 Atrioventricular block, second degree: Secondary | ICD-10-CM

## 2023-09-06 NOTE — Progress Notes (Signed)
History of Present Illness:  Patient is a 89 y.o. year old male who presents for evaluation of carotid stenosis.  He has hx of right CEA for symptomatic carotid artery stenosis in 2015 by Dr. Darrick Penna.  The patient denies symptoms of TIA, amaurosis, or stroke.     He has a history of AAA and underwent EVAR fixation by Dr. Arbie Cookey on 02/23/2009.  He denies claudication, rest pain and non healing wounds.  No abdominal or lumbar pain out of the ordinary.  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     He states he has right shoulder pain and his right hand fall asleep during the night , other than these new complaints he is doing well.   The pt is on a statin for cholesterol management.    The pt is not on an aspirin.    Other AC:  Plavix The pt is on BB, ARB, diuretic for hypertension.  The pt does not have diabetes. Tobacco hx:  former  Past Medical History:  Diagnosis Date   AAA (abdominal aortic aneurysm) (HCC)    Abnormal stress test    Allergic rhinitis    uses Flonase daily   Anginal pain (HCC)    pain in neck,shoulders,down arms occ   Arthritis    Cancer (HCC)    skin   Carotid artery occlusion    Chronic venous insufficiency    Coronary artery disease    Dizziness    takes Antivert daily as needed   ED (erectile dysfunction)    GERD (gastroesophageal reflux disease)    takes Omeprazole daily   Hiatal hernia    Hx of echocardiogram 04/04/2010   showed a borderline dilatd left ventricle with mild left ventricle hypertrophy and an EF 50-55% doppler suggested diastolic dysfunction, although the pattern is probably normal for an 89 yrs old. The left atrium was indeed mildly dilated at 42 mm, but there are no significant valvular abnormalities.    Hyperlipidemia    Hypertension    takes Proscar,Dyazide,Avapro,and Metoprolol daily   Myocardial infarction [facility]) Oct. 11, 2016   Heart Attack   Palpitation    Shortness of breath    Sigmoid diverticulosis    T12 compression fracture  [facility])    Thyroid disease     Past Surgical History:  Procedure Laterality Date   ABDOMINAL AORTIC ANEURYSM REPAIR  02/24/2009   AAA Stenting   CARDIAC CATHETERIZATION N/A 08/04/2015   Procedure: Right/Left Heart Cath and Coronary Angiography;  Surgeon: Laurey Morale, MD;  Location: [facility] INVASIVE CV LAB;  Service: Cardiovascular;  Laterality: N/A;   CARDIAC CATHETERIZATION N/A 08/04/2015   Procedure: Coronary Stent Intervention;  Surgeon: Kathleene Hazel, MD;  Location: [facility] INVASIVE CV LAB;  Service: Cardiovascular;  Laterality: N/A;   CARDIAC CATHETERIZATION N/A 08/08/2015   Procedure: Coronary Stent Intervention;  Surgeon: Peter M Swaziland, MD;  Location: [facility] INVASIVE CV LAB;  Service: Cardiovascular;  Laterality: N/A;   ENDARTERECTOMY Right 10/27/2013   Procedure: ENDARTERECTOMY CAROTID;  Surgeon: Sherren Kerns, MD;  Location: [facility] OR;  Service: Vascular;  Laterality: Right;   EP IMPLANTABLE DEVICE N/A 01/26/2016   Procedure: BiV Pacemaker Insertion CRT-P;  Surgeon: Marinus Maw, MD;  Location: [facility] INVASIVE CV LAB;  Service: Cardiovascular;  Laterality: N/A;   HAND SURGERY Right    tendon, lft 90's   HERNIA REPAIR Bilateral    LEFT HEART CATHETERIZATION WITH CORONARY ANGIOGRAM N/A 10/12/2013   Procedure: LEFT HEART CATHETERIZATION  WITH CORONARY ANGIOGRAM;  Surgeon: Runell Gess, MD;  Location: [facility] CATH LAB;  Service: Cardiovascular;  Laterality: N/A;   melanoma removed     ROTATOR CUFF REPAIR  2003   left arm     Social History Social History   Tobacco Use   Smoking status: Former    Current packs/day: 0.00    Average packs/day: 2.0 packs/day for 30.0 years (60.0 ttl pk-yrs)    Types: Cigarettes    Start date: 10/23/1947    Quit date: 10/22/1977    Years since quitting: 45.9    Passive exposure: Never   Smokeless tobacco: Former    Types: Chew    Quit date: 10/22/1984  Vaping Use   Vaping status: Never Used  Substance Use Topics   Alcohol use: Yes    Alcohol/week: 7.0  standard drinks of alcohol    Types: 7 Cans of beer per week    Comment: occ   Drug use: No    Family History Family History  Problem Relation Age of Onset   Heart disease Mother        before age 87   COPD Mother    Diabetes Mother    Hyperlipidemia Mother    Hypertension Mother     Allergies  Allergies  Allergen Reactions   Doxepin Other (See Comments)    hallucinations  hallucinations Hallucinations  hallucinations Hallucinations hallucinations Hallucinations   Sinequan [Doxepin Hcl] Other (See Comments)    hallucinations   Meloxicam Other (See Comments)   Verapamil Other (See Comments)    Caused heart to race Caused heart to race  Caused heart to race Caused heart to race   Procaine Itching and Other (See Comments)    Tingling all over as soon as it was injected  Tingling all over as soon as it was injected Tingling all over as soon as it was injected     Current Outpatient Medications  Medication Sig Dispense Refill   acetaminophen (TYLENOL) 500 MG tablet Take 1,000 mg by mouth daily as needed for mild pain or headache. Reported on 01/25/2016     atorvastatin (LIPITOR) 40 MG tablet Take 1 tablet (40 mg total) by mouth daily at 6 PM. 30 tablet 6   Calcium Carbonate-Vitamin D (CALCIUM 600 + D PO) Take 1 tablet by mouth 2 (two) times daily.     carvedilol (COREG) 3.125 MG tablet Take 3 tablets (9.375 mg total) by mouth 2 (two) times daily with a meal. (Patient taking differently: Take 9.375 mg by mouth 2 (two) times daily with a meal. Taking 12.5 mg twice daily) 180 tablet 3   clopidogrel (PLAVIX) 75 MG tablet TAKE 1 TABLET(75 MG) BY MOUTH DAILY 90 tablet 1   clotrimazole (LOTRIMIN) 1 % cream Apply 1 Application topically as needed.     dapagliflozin propanediol (FARXIGA) 10 MG TABS tablet Take 1 tablet (10 mg total) by mouth daily before breakfast. 90 tablet 3   FEROSUL 325 (65 Fe) MG tablet TAKE 1 TABLET BY MOUTH EVERY DAY QITH BREAKFAST 30 tablet 3    finasteride (PROSCAR) 5 MG tablet take 1 tablet by mouth once daily     fluticasone (FLONASE) 50 MCG/ACT nasal spray Place 1 spray into both nostrils daily as needed for allergies.      folic acid (FOLVITE) 1 MG tablet Take 1 mg by mouth daily.     furosemide (LASIX) 20 MG tablet Take 1 tablet (20 mg total) by mouth as needed for fluid or  edema (for a weight gain of 3-5 in a week.). 15 tablet 6   gabapentin (NEURONTIN) 300 MG capsule Take 300 mg by mouth 2 (two) times daily.  0   meclizine (ANTIVERT) 25 MG tablet Take 1 tablet (25 mg total) by mouth 2 (two) times daily as needed for dizziness. 30 tablet 0   meloxicam (MOBIC) 15 MG tablet Take 15 mg by mouth daily as needed for pain. Pt taking as needed  0   methotrexate (RHEUMATREX) 2.5 MG tablet Take 17.5 mg by mouth once a week. Caution:Chemotherapy. Protect from light.7 tablets     mometasone (ELOCON) 0.1 % cream Apply 1 application topically daily as needed (for irritation).      Multiple Vitamin (MULTIVITAMIN PO) Take 1 tablet by mouth daily. ALIVE men's vitamin     nitroGLYCERIN (NITROSTAT) 0.4 MG SL tablet PLACE 1 TABLET UNDER THE TONGUE IF NEEDED EVERY 5 MINUTES FOR CHEST PAIN FOR 3 DOSES IF NO RELIEF AFTER FIRST DOSE CALL PRESCRIBER OR 911 25 tablet 3   pantoprazole (PROTONIX) 40 MG tablet Take 40 mg by mouth daily.     Secukinumab (COSENTYX SENSOREADY PEN) 150 MG/ML SOAJ Inject 150 mg into the skin every 28 (twenty-eight) days.     spironolactone (ALDACTONE) 25 MG tablet Take 1 tablet (25 mg total) by mouth daily. 90 tablet 3   sulfaSALAzine (AZULFIDINE) 500 MG tablet Take 1,500 mg by mouth 2 (two) times daily.     losartan (COZAAR) 25 MG tablet Take 1 tablet (25 mg total) by mouth daily. 90 tablet 3   No current facility-administered medications for this visit.    ROS:   General:  No weight loss, Fever, chills  HEENT: No recent headaches, no nasal bleeding, no visual changes, no sore throat  Neurologic: No dizziness, blackouts,  seizures. No recent symptoms of stroke or mini- stroke. No recent episodes of slurred speech, or temporary blindness.  Cardiac: No recent episodes of chest pain/pressure, no shortness of breath at rest.  No shortness of breath with exertion.  Denies history of atrial fibrillation or irregular heartbeat  Vascular: No history of rest pain in feet.  No history of claudication.  No history of non-healing ulcer, No history of DVT   Pulmonary: No home oxygen, no productive cough, no hemoptysis,  No asthma or wheezing  Musculoskeletal:  [ x] Arthritis, [ ]  Low back pain,  [x ] Joint pain  Hematologic:No history of hypercoagulable state.  No history of easy bleeding.  No history of anemia  Gastrointestinal: No hematochezia or melena,  No gastroesophageal reflux, no trouble swallowing  Urinary: [ ]  chronic Kidney disease, [ ]  on HD - [ ]  MWF or [ ]  TTHS, [ ]  Burning with urination, [ ]  Frequent urination, [ ]  Difficulty urinating;   Skin: No rashes  Psychological: No history of anxiety,  No history of depression   Physical Examination  Vitals:   09/06/23 0922 09/06/23 0925  BP: (!) 156/63 (!) 149/67  Pulse: 72   Temp: 98.3 F (36.8 C)   TempSrc: Temporal   SpO2: 90%   Weight: 156 lb 8 oz (71 kg)   Height: 5\' 10"  (1.778 m)     Body mass index is 22.46 kg/m.  General:  Alert and oriented, no acute distress HEENT: Normal Neck: No bruit or JVD Pulmonary: Clear to auscultation bilaterally Cardiac: Regular Rate and Rhythm without murmur Gastrointestinal: Soft, non-tender, non-distended, no mass, no scars Skin: No rash Extremity Pulses:  radial and femorals palpable  pulses B, non palpable pedal pulses, no ischemic skin changes Musculoskeletal: No deformity or edema  Neurologic: Upper and lower extremity motor 5/5 and symmetric  DATA:  Right Carotid Findings:  +----------+--------+--------+--------+------------------+--------+           PSV cm/sEDV cm/sStenosisPlaque  DescriptionComments  +----------+--------+--------+--------+------------------+--------+  CCA Prox  155     10                                          +----------+--------+--------+--------+------------------+--------+  CCA Mid   140     13              homogeneous                 +----------+--------+--------+--------+------------------+--------+  CCA Distal114     13              heterogenous                +----------+--------+--------+--------+------------------+--------+  ICA Prox  99      17      1-39%   heterogenous                +----------+--------+--------+--------+------------------+--------+  ICA Mid   112     23                                          +----------+--------+--------+--------+------------------+--------+  ICA Distal76      14                                          +----------+--------+--------+--------+------------------+--------+  ECA      85      4                                           +----------+--------+--------+--------+------------------+--------+   +----------+--------+-------+----------------+-------------------+           PSV cm/sEDV cmsDescribe        Arm Pressure (mmHG)  +----------+--------+-------+----------------+-------------------+  [id number]     4      Multiphasic, [id number]                  +----------+--------+-------+----------------+-------------------+   +---------+--------+--+--------+--+---------+  VertebralPSV cm/s49EDV cm/s10Antegrade  +---------+--------+--+--------+--+---------+      Left Carotid Findings:  +----------+--------+--------+--------+-------------------------+--------+           PSV cm/sEDV cm/sStenosisPlaque Description       Comments  +----------+--------+--------+--------+-------------------------+--------+  CCA Prox  135     11                                                  +----------+--------+--------+--------+-------------------------+--------+  CCA Mid   103     10                                                 +----------+--------+--------+--------+-------------------------+--------+  CCA Distal94      8                                                  +----------+--------+--------+--------+-------------------------+--------+  ICA Prox  145     27      1-39%   calcific and heterogenous          +----------+--------+--------+--------+-------------------------+--------+  ICA Mid   142     19                                                 +----------+--------+--------+--------+-------------------------+--------+  ICA Distal83      17                                                 +----------+--------+--------+--------+-------------------------+--------+  ECA      143     6                                                  +----------+--------+--------+--------+-------------------------+--------+   +----------+--------+--------+----------------+-------------------+           PSV cm/sEDV cm/sDescribe        Arm Pressure (mmHG)  +----------+--------+--------+----------------+-------------------+  Subclavian180    7       Multiphasic, [id number]                  +----------+--------+--------+----------------+-------------------+   +---------+--------+--+--------+--+---------+  VertebralPSV cm/s59EDV cm/s12Antegrade  +---------+--------+--+--------+--+---------+      Summary:  Right Carotid: Velocities in the right ICA are consistent with a 1-39%  stenosis.   Left Carotid: Velocities in the left ICA are consistent with a 1-39%  stenosis.   Vertebrals: Bilateral vertebral arteries demonstrate antegrade flow.  Subclavians: Normal flow hemodynamics were seen in bilateral subclavian               arteries.   Endovascular Aortic Repair (EVAR):   +----------+----------------+-------------------+-------------------+           Diameter AP (cm)Diameter Trans (cm)Velocities (cm/sec)  +----------+----------------+-------------------+-------------------+  Aorta    3.40            3.00               83                   +----------+----------------+-------------------+-------------------+  Right Limb1.16            1.28               104                  +----------+----------------+-------------------+-------------------+  Left Limb 1.21            1.14               134                  +----------+----------------+-------------------+-------------------+   +-------------+---------------+  Endoleak TypeNone visualized  +-------------+---------------+     Summary:  Abdominal Aorta: The largest aortic diameter remains essentially unchanged  compared to prior exam. Previous diameter measurement was 3.4 cm obtained   ASSESSMENT/PLAN: 89 y.o. male here for follow up forAAA and carotid stenosis with hx of hx of right CEA for symptomatic carotid artery stenosis in 2015 by Dr. Darrick Penna and EVAR on 02/23/2009 by Dr. Arbie Cookey for AAA. No abdominal pain, no symptoms of stroke/TIA.  He denies claudication, rest pain or non healing wounds.  He wears thigh high compression sock daily.    He will continue activity as tolerates.  I suggested he try sleeping on his back and not on the right side.  He may have AC joint arthritis and possible carpal tunnel.  He states he has seen Dr. Merlyn Lot in the past and may give his office a call.  He will f/u in 1 year for repeat surveillance.         Mosetta Pigeon PA-C [facility] Office: [phone]  MD on call Randie Heinz

## 2023-09-07 LAB — CUP PACEART REMOTE DEVICE CHECK
Battery Remaining Longevity: 24 mo
Battery Remaining Percentage: 23 %
Battery Voltage: 2.87 V
Brady Statistic AP VP Percent: 1.9 %
Brady Statistic AP VS Percent: 1 %
Brady Statistic AS VP Percent: 98 %
Brady Statistic AS VS Percent: 1 %
Brady Statistic RA Percent Paced: 1.7 %
Date Time Interrogation Session: 20241115020014
Implantable Lead Connection Status: 753985
Implantable Lead Connection Status: 753985
Implantable Lead Connection Status: 753985
Implantable Lead Implant Date: 20170406
Implantable Lead Implant Date: 20170406
Implantable Lead Implant Date: 20170406
Implantable Lead Location: 753858
Implantable Lead Location: 753859
Implantable Lead Location: 753860
Implantable Pulse Generator Implant Date: 20170406
Lead Channel Impedance Value: 440 Ohm
Lead Channel Impedance Value: 460 Ohm
Lead Channel Impedance Value: 760 Ohm
Lead Channel Pacing Threshold Amplitude: 0.5 V
Lead Channel Pacing Threshold Amplitude: 0.875 V
Lead Channel Pacing Threshold Amplitude: 1.25 V
Lead Channel Pacing Threshold Pulse Width: 0.4 ms
Lead Channel Pacing Threshold Pulse Width: 0.4 ms
Lead Channel Pacing Threshold Pulse Width: 0.4 ms
Lead Channel Sensing Intrinsic Amplitude: 1.7 mV
Lead Channel Sensing Intrinsic Amplitude: 12 mV
Lead Channel Setting Pacing Amplitude: 2 V
Lead Channel Setting Pacing Amplitude: 2 V
Lead Channel Setting Pacing Amplitude: 2.25 V
Lead Channel Setting Pacing Pulse Width: 0.4 ms
Lead Channel Setting Pacing Pulse Width: 0.4 ms
Lead Channel Setting Sensing Sensitivity: 2 mV
Pulse Gen Model: 3262
Pulse Gen Serial Number: 7866838

## 2023-09-11 NOTE — Progress Notes (Signed)
Remote pacemaker transmission.   

## 2023-09-13 ENCOUNTER — Other Ambulatory Visit: Payer: Self-pay

## 2023-09-13 DIAGNOSIS — Z8679 Personal history of other diseases of the circulatory system: Secondary | ICD-10-CM

## 2023-09-13 DIAGNOSIS — I6523 Occlusion and stenosis of bilateral carotid arteries: Secondary | ICD-10-CM

## 2023-09-18 ENCOUNTER — Other Ambulatory Visit (HOSPITAL_COMMUNITY): Payer: Self-pay

## 2023-09-18 MED ORDER — CARVEDILOL 12.5 MG PO TABS: 12.5000 mg | ORAL_TABLET | Freq: Two times a day (BID) | ORAL | 3 refills | Status: DC

## 2023-09-27 DIAGNOSIS — Z79899 Other long term (current) drug therapy: Secondary | ICD-10-CM | POA: Diagnosis not present

## 2023-09-27 DIAGNOSIS — M8580 Other specified disorders of bone density and structure, unspecified site: Secondary | ICD-10-CM | POA: Diagnosis not present

## 2023-09-27 DIAGNOSIS — I5022 Chronic systolic (congestive) heart failure: Secondary | ICD-10-CM | POA: Diagnosis not present

## 2023-09-27 DIAGNOSIS — M459 Ankylosing spondylitis of unspecified sites in spine: Secondary | ICD-10-CM | POA: Diagnosis not present

## 2023-09-27 DIAGNOSIS — M25561 Pain in right knee: Secondary | ICD-10-CM | POA: Diagnosis not present

## 2023-10-04 ENCOUNTER — Encounter (HOSPITAL_COMMUNITY): Payer: Self-pay | Admitting: Cardiology

## 2023-10-04 ENCOUNTER — Ambulatory Visit (HOSPITAL_COMMUNITY)
Admission: RE | Admit: 2023-10-04 | Discharge: 2023-10-04 | Disposition: A | Discharge status: Home / Self Care | Payer: Medicare Other | Source: Ambulatory Visit | Attending: Cardiology | Admitting: Cardiology

## 2023-10-04 VITALS — BP 118/60 | HR 65 | Wt 154.6 lb

## 2023-10-04 DIAGNOSIS — Z95810 Presence of automatic (implantable) cardiac defibrillator: Secondary | ICD-10-CM | POA: Diagnosis not present

## 2023-10-04 DIAGNOSIS — Z955 Presence of coronary angioplasty implant and graft: Secondary | ICD-10-CM | POA: Diagnosis not present

## 2023-10-04 DIAGNOSIS — Z7902 Long term (current) use of antithrombotics/antiplatelets: Secondary | ICD-10-CM | POA: Insufficient documentation

## 2023-10-04 DIAGNOSIS — I5022 Chronic systolic (congestive) heart failure: Secondary | ICD-10-CM | POA: Insufficient documentation

## 2023-10-04 DIAGNOSIS — M549 Dorsalgia, unspecified: Secondary | ICD-10-CM | POA: Insufficient documentation

## 2023-10-04 DIAGNOSIS — G8929 Other chronic pain: Secondary | ICD-10-CM | POA: Diagnosis not present

## 2023-10-04 DIAGNOSIS — I447 Left bundle-branch block, unspecified: Secondary | ICD-10-CM | POA: Diagnosis not present

## 2023-10-04 DIAGNOSIS — I2510 Atherosclerotic heart disease of native coronary artery without angina pectoris: Secondary | ICD-10-CM | POA: Insufficient documentation

## 2023-10-04 DIAGNOSIS — Z79899 Other long term (current) drug therapy: Secondary | ICD-10-CM | POA: Insufficient documentation

## 2023-10-04 DIAGNOSIS — R9431 Abnormal electrocardiogram [ECG] [EKG]: Secondary | ICD-10-CM | POA: Insufficient documentation

## 2023-10-04 DIAGNOSIS — I252 Old myocardial infarction: Secondary | ICD-10-CM | POA: Insufficient documentation

## 2023-10-04 DIAGNOSIS — I255 Ischemic cardiomyopathy: Secondary | ICD-10-CM | POA: Diagnosis not present

## 2023-10-04 DIAGNOSIS — M459 Ankylosing spondylitis of unspecified sites in spine: Secondary | ICD-10-CM | POA: Insufficient documentation

## 2023-10-04 DIAGNOSIS — Z8249 Family history of ischemic heart disease and other diseases of the circulatory system: Secondary | ICD-10-CM | POA: Insufficient documentation

## 2023-10-04 LAB — BASIC METABOLIC PANEL
Anion gap: 8 (ref 5–15)
BUN: 18 mg/dL (ref 8–23)
CO2: 24 mmol/L (ref 22–32)
Calcium: 9.1 mg/dL (ref 8.9–10.3)
Chloride: 104 mmol/L (ref 98–111)
Creatinine, Ser: 0.9 mg/dL (ref 0.61–1.24)
GFR, Estimated: >60 mL/min (ref >60)
Glucose, Bld: 112 mg/dL — ABNORMAL HIGH (ref 70–99)
Potassium: 3.4 mmol/L — ABNORMAL LOW (ref 3.5–5.1)
Sodium: 136 mmol/L (ref 135–145)

## 2023-10-04 LAB — CBC
HCT: 41.7 % (ref 39.0–52.0)
Hemoglobin: 14.1 g/dL (ref 13.0–17.0)
MCH: 33.2 pg (ref 26.0–34.0)
MCHC: 33.8 g/dL (ref 30.0–36.0)
MCV: 98.1 fL (ref 80.0–100.0)
Platelets: 170 10*3/uL (ref 150–400)
RBC: 4.25 MIL/uL (ref 4.22–5.81)
RDW: 12.3 % (ref 11.5–15.5)
WBC: 6.7 10*3/uL (ref 4.0–10.5)
nRBC: 0 % (ref 0.0–0.2)

## 2023-10-04 NOTE — Patient Instructions (Signed)
Great to see you!!!  Continue current medications  Your physician recommends that you schedule a follow-up appointment in: 4 months

## 2023-10-05 NOTE — Progress Notes (Signed)
Patient ID: Henry Neal, male   DOB: 1927/08/23, 89 y.o.   MRN: [id number]     Advanced Heart Failure Clinic Note  PCP: Dr. 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 male with history of CAD, chronic systolic CHF, LBBB, AAA s/p repair presents for followup of CHF and CAD.  He had a cardiac cath in 12/15, showing total occlusion of the LAD and 50-60% proximal RCA.  EF at that time was near-normal.  In 10/16, he developed dyspnea and epigastric pain.  He was noted to be in acute pulmonary edema and was diuresed.  Troponin was only mildly elevated at 0.47.  He ultimately underwent LHC showing hazy 80-90% RCA stenosis which was likely the culprit for his presentation.  Additionally, the LAD was only subtotally occluded on this cath.  He had DES to RCA.  Cardiac MRI was done, showing EF 22% with significant viability in LAD territory.  Therefore, CTO of LAD was opened with DES.  Echo in 2/17 showed EF persistently low at 15%.  He had St Jude CRT-P placement in 4/17. Echo in 1/19 showed EF up to 45-50% with mild MR.    Echo 3/20 showed EF 45% with mild diffuse hypokinesis, mildly decreased RV systolic function. Echo in 3/21 showed EF 45-50%, mild RV dilation with normal systolic function. Echo in 10/22 showed EF 50-55%, normal RV, mild MR.    He was diagnosed with ankylosing spondylitis and is now on methotrexate.    Echo 1/24 EF 45-50%, mild RV dysfunction.   Patient returns for followup of CHF.  He has some balance difficult likely due to kyposis, and is using a cane or walker.  No dyspnea walking around [facility] to visit his son who is hospitalized again with complications of pancreatitis.  He is still working in a Insurance claims handler several days/week.  No chest pain.  No lightheadedness.  No orthopnea/PND. Main limitation is right knee pain.   St Jude device interrogation: >99% BiV pacing, no AF, stable thoracic impedance.   ECG (personally reviewed): NSR, BiV  pacing  Labs (1/18): LDL 37 Labs (01/25/2017): K 3.5 Creatinine 0.96  Labs (8/18): BNP 30, K 3.7, creatinine 1.0 Labs (11/18): LDL 40, HDL 47, K 3.5, creatinine 5.28  Labs (3/19): K 4, creatinine 0.97 Labs (11/19): K 3.9, creatinine 1.2, LDL 44 Labs (4/20): K 4.1, creatinine 1.11 Labs (10/20): LDL 46 Labs (11/20): K 4.4, creatinine 1.05 Labs (3/21): K 4.2, creatinine 0.92 Labs (7/21): LDL 58 Labs (8/21): K 4, creatinine 1.27 Labs (11/21): K 3.9, creatinine 0.88 Labs (9/22): K 3.6, creatinine 0.82, LDL 44 Labs (6/23): K 4.6, creatinine 0.86 Labs (9/23): K 4.5, creatinine 0.97, LDL 49, HDL 68 Labs (4/24): K 3.9, SCr 0.80, tSat 16, ferritin 7.1 Labs (5/24): K 3.9, SCr .96, LDL 41 Labs (6/24): K 4, creatinine 0.89  Labs (8/24): BNP 80, K 3.4, creatinine 0.83  PMH: 1. CKD 2. AAA s/p repair, followed at [facility]. 3. Carotid stenosis: s/p right CEA.   Carotid dopplers (2/16) with right CEA stable, < 40% LICA stenosis.  Followed at [facility].  - Carotid dopplers (3/18): 1-39% LICA, patent right CEA.  4. Chronic LBBB 5. LAD: LHC (12/15) with totally occluded LAD, 50-60% pRCA => medically managed.  10/16 admitted with NSTEMI.  LHC with subtotalled LAD occlusion, 80-90% pRCA with thrombus => DES RCA initially.  Cardiac MRI was done showing viability in the LAD territory, so he had DES to the CTO LAD.  6. Chronic systolic CHF: Ischemic cardiomyopathy.  Echo (10/16) with EF 15% with wall motion abnormalities, moderately dilated RV with normal systolic function.  CMRI (10/16) with severe LV dilation, EF 22% with septal-lateral dyssynchrony, no LV thrombus, normal RV size/systolic function => significant viability noted in the LAD territory.  Echo (2/17): EF 15%, severe LV dilation, wall motion abnormalities noted, mild MR, mildly dilated RV with normal systolic function, PASP 55 mmHg.  St Jude CRT-P 4/17.  - Echo (1/18): EF 50-55%, mild to moderate MR.  - Echo (1/19): EF 45-50%, mild LVH, inferior hypokinesis,  mild MR, PASP 30 mmHg.  - Echo (3/20): EF 45%, diffuse hypokinesis, normal RV size with mildly decreased systolic function.  - Echo (3/21): EF 45-50%, mildly dilated RV with normal systolic function.  - Echo (10/22): EF 50-55%, normal RV, mild MR - Echo (1/24): EF 45-50%, mild RV dysfunction 7. ABIs (10/16) were normal.  8. Raynaud's syndrome.  9. Sciatica  SH: Lives alone, prior smoker, still working as a Chartered certified accountant.  FH: CAD  Review of systems complete and found to be negative unless listed in HPI.    Current Outpatient Medications  Medication Sig Dispense Refill   acetaminophen (TYLENOL) 500 MG tablet Take 1,000 mg by mouth daily as needed for mild pain or headache. Reported on 01/25/2016     atorvastatin (LIPITOR) 40 MG tablet Take 1 tablet (40 mg total) by mouth daily at 6 PM. 30 tablet 6   Calcium Carbonate-Vitamin D (CALCIUM 600 + D PO) Take 1 tablet by mouth 2 (two) times daily.     carvedilol (COREG) 12.5 MG tablet Take 1 tablet (12.5 mg total) by mouth 2 (two) times daily with a meal. 180 tablet 3   clopidogrel (PLAVIX) 75 MG tablet TAKE 1 TABLET(75 MG) BY MOUTH DAILY 90 tablet 1   clotrimazole (LOTRIMIN) 1 % cream Apply 1 Application topically as needed.     dapagliflozin propanediol (FARXIGA) 10 MG TABS tablet Take 1 tablet (10 mg total) by mouth daily before breakfast. 90 tablet 3   FEROSUL 325 (65 Fe) MG tablet TAKE 1 TABLET BY MOUTH EVERY DAY QITH BREAKFAST 30 tablet 3   finasteride (PROSCAR) 5 MG tablet take 1 tablet by mouth once daily     fluticasone (FLONASE) 50 MCG/ACT nasal spray Place 1 spray into both nostrils daily as needed for allergies.      folic acid (FOLVITE) 1 MG tablet Take 1 mg by mouth daily.     furosemide (LASIX) 20 MG tablet Take 1 tablet (20 mg total) by mouth as needed for fluid or edema (for a weight gain of 3-5 in a week.). 15 tablet 6   gabapentin (NEURONTIN) 300 MG capsule Take 300 mg by mouth 2 (two) times daily.  0   losartan (COZAAR) 25 MG  tablet Take 1 tablet (25 mg total) by mouth daily. 90 tablet 3   meclizine (ANTIVERT) 25 MG tablet Take 1 tablet (25 mg total) by mouth 2 (two) times daily as needed for dizziness. 30 tablet 0   meloxicam (MOBIC) 15 MG tablet Take 15 mg by mouth daily as needed for pain. Pt taking as needed  0   methotrexate (RHEUMATREX) 2.5 MG tablet Take 17.5 mg by mouth once a week. Caution:Chemotherapy. Protect from light.7 tablets     mometasone (ELOCON) 0.1 % cream Apply 1 application topically daily as needed (for irritation).      Multiple Vitamin (MULTIVITAMIN PO) Take 1 tablet by mouth daily. ALIVE men's  vitamin     nitroGLYCERIN (NITROSTAT) 0.4 MG SL tablet PLACE 1 TABLET UNDER THE TONGUE IF NEEDED EVERY 5 MINUTES FOR CHEST PAIN FOR 3 DOSES IF NO RELIEF AFTER FIRST DOSE CALL PRESCRIBER OR 911 25 tablet 3   pantoprazole (PROTONIX) 40 MG tablet Take 40 mg by mouth daily.     Secukinumab (COSENTYX SENSOREADY PEN) 150 MG/ML SOAJ Inject 150 mg into the skin every 28 (twenty-eight) days.     spironolactone (ALDACTONE) 25 MG tablet Take 1 tablet (25 mg total) by mouth daily. 90 tablet 3   sulfaSALAzine (AZULFIDINE) 500 MG tablet Take 1,500 mg by mouth 2 (two) times daily.     No current facility-administered medications for this encounter.   BP 118/60   Pulse 65   Wt 70.1 kg (154 lb 9.6 oz)   SpO2 97%   BMI 22.18 kg/m   Wt Readings from Last 3 Encounters:  10/04/23 70.1 kg (154 lb 9.6 oz)  09/06/23 71 kg (156 lb 8 oz)  06/20/23 71.8 kg (158 lb 6.4 oz)    Physical exam:  General: NAD Neck: No JVD, no thyromegaly or thyroid nodule.  Lungs: Clear to auscultation bilaterally with normal respiratory effort. CV: Nondisplaced PMI.  Heart regular S1/S2, no S3/S4, no murmur.  No peripheral edema.  No carotid bruit.  Normal pedal pulses.  Abdomen: Soft, nontender, no hepatosplenomegaly, no distention.  Skin: Intact without lesions or rashes.  Neurologic: Alert and oriented x 3.  Psych: Normal  affect. Extremities: No clubbing or cyanosis.  HEENT: Normal.   Assessment/Plan: 1. Chronic HF with mid range EF: Ischemic cardiomyopathy. Echo in 3/21 showed EF 45-50%, improved since getting CRT.  Echo 10/22 showed EF 50-55%.  Echo 1/24 EF 45-50%.  St Jude CRT-D.  NYHA class II symptoms, not volume overloaded by exam or [place]vue.     - Continue Farxiga 10 mg daily.  - Continue Coreg 12.5 mg bid  - Continue spironolactone 25 mg daily.  BMET/BNP today.  - Continue losartan 25 mg daily, had a lot of dizziness with Entresto.  - I will arrange for repeat echo in 1/25.  2. CAD: NSTEMI with DES to RCA (culprit vessel) and subsequent DES to CTO of LAD (cMRI showed viability in the LAD distribution) in 10/16. Atypical chest pain, suspect not ischemic.  - Continue Plavix. CBC today.  - Atorvastatin 40 daily. Good lipids in 5/24.  3. Carotid stenosis: Minimal carotid stenosis on 9/22 dopplers.   4. Ankylosing spondylitis: Chronic back pain. He is on MTX.    Followup 4 months with APP.   Marca Ancona, MD  10/05/2023

## 2023-10-22 ENCOUNTER — Encounter: Payer: Self-pay | Admitting: Cardiology

## 2023-10-31 ENCOUNTER — Encounter (HOSPITAL_COMMUNITY): Payer: Medicare Other

## 2023-11-11 ENCOUNTER — Other Ambulatory Visit: Payer: Self-pay | Admitting: Physician Assistant

## 2023-11-13 NOTE — Telephone Encounter (Signed)
Pt scheduled to see Dr. Leonides Schanz 01/22/24 at 8:30am, appt letter mailed to pt.

## 2023-12-06 ENCOUNTER — Ambulatory Visit (INDEPENDENT_AMBULATORY_CARE_PROVIDER_SITE_OTHER): Payer: Medicare Other

## 2023-12-06 ENCOUNTER — Ambulatory Visit: Payer: Medicare Other | Admitting: Podiatry

## 2023-12-06 DIAGNOSIS — I441 Atrioventricular block, second degree: Secondary | ICD-10-CM

## 2023-12-07 LAB — CUP PACEART REMOTE DEVICE CHECK
Battery Remaining Longevity: 22 mo
Battery Remaining Percentage: 20 %
Battery Voltage: 2.87 V
Brady Statistic AP VP Percent: 1.8 %
Brady Statistic AP VS Percent: 1 %
Brady Statistic AS VP Percent: 98 %
Brady Statistic AS VS Percent: 1 %
Brady Statistic RA Percent Paced: 1.5 %
Date Time Interrogation Session: 20250214032627
Implantable Lead Connection Status: 753985
Implantable Lead Connection Status: 753985
Implantable Lead Connection Status: 753985
Implantable Lead Implant Date: 20170406
Implantable Lead Implant Date: 20170406
Implantable Lead Implant Date: 20170406
Implantable Lead Location: 753858
Implantable Lead Location: 753859
Implantable Lead Location: 753860
Implantable Pulse Generator Implant Date: 20170406
Lead Channel Impedance Value: 460 Ohm
Lead Channel Impedance Value: 490 Ohm
Lead Channel Impedance Value: 790 Ohm
Lead Channel Pacing Threshold Amplitude: 0.5 V
Lead Channel Pacing Threshold Amplitude: 1 V
Lead Channel Pacing Threshold Amplitude: 1.5 V
Lead Channel Pacing Threshold Pulse Width: 0.4 ms
Lead Channel Pacing Threshold Pulse Width: 0.4 ms
Lead Channel Pacing Threshold Pulse Width: 0.4 ms
Lead Channel Sensing Intrinsic Amplitude: 12 mV
Lead Channel Sensing Intrinsic Amplitude: 2.2 mV
Lead Channel Setting Pacing Amplitude: 2 V
Lead Channel Setting Pacing Amplitude: 2 V
Lead Channel Setting Pacing Amplitude: 2.5 V
Lead Channel Setting Pacing Pulse Width: 0.4 ms
Lead Channel Setting Pacing Pulse Width: 0.4 ms
Lead Channel Setting Sensing Sensitivity: 2 mV
Pulse Gen Model: 3262
Pulse Gen Serial Number: 7866838

## 2023-12-08 ENCOUNTER — Encounter: Payer: Self-pay | Admitting: Internal Medicine

## 2023-12-16 ENCOUNTER — Other Ambulatory Visit (HOSPITAL_COMMUNITY): Payer: Self-pay | Admitting: Cardiology

## 2023-12-16 DIAGNOSIS — H10502 Unspecified blepharoconjunctivitis, left eye: Secondary | ICD-10-CM | POA: Diagnosis not present

## 2023-12-16 DIAGNOSIS — H1132 Conjunctival hemorrhage, left eye: Secondary | ICD-10-CM | POA: Diagnosis not present

## 2023-12-20 ENCOUNTER — Ambulatory Visit: Payer: Medicare Other | Admitting: Podiatry

## 2023-12-20 ENCOUNTER — Encounter: Payer: Self-pay | Admitting: Podiatry

## 2023-12-20 DIAGNOSIS — M79676 Pain in unspecified toe(s): Secondary | ICD-10-CM | POA: Diagnosis not present

## 2023-12-20 DIAGNOSIS — I739 Peripheral vascular disease, unspecified: Secondary | ICD-10-CM | POA: Diagnosis not present

## 2023-12-20 DIAGNOSIS — B351 Tinea unguium: Secondary | ICD-10-CM

## 2023-12-20 NOTE — Progress Notes (Signed)
 This patient returns to my office for at risk foot care.  This patient requires this care by a professional since this patient will be at risk due to having renal insufficiency coagulation defect and PAD.   Patient is taking plavix.    This patient is unable to cut nails himself since the patient cannot reach his nails.These nails are painful walking and wearing shoes.  This patient presents for at risk foot care today.  General Appearance  Alert, conversant and in no acute stress.  Vascular  Dorsalis pedis and posterior tibial  pulses are weakly  palpable  bilaterally.  Capillary return is within normal limits  bilaterally. Temperature is within normal limits  bilaterally.  Neurologic  Senn-Weinstein monofilament wire test within normal limits  bilaterally. Muscle power within normal limits bilaterally.  Nails Thick disfigured discolored nails with subungual debris  from hallux to fifth toes bilaterally. No evidence of bacterial infection or drainage bilaterally.  Orthopedic  No limitations of motion  feet .  No crepitus or effusions noted.  No bony pathology or digital deformities noted.  Skin  normotropic skin with no porokeratosis noted bilaterally.  No signs of infections or ulcers noted.     Onychomycosis  Pain in right toes  Pain in left toes  Consent was obtained for treatment procedures.   Mechanical debridement of nails 1-5  bilaterally performed with a nail nipper.  Filed with dremel without incident.    Return office visit   10 weeks                   Told patient to return for periodic foot care and evaluation due to potential at risk complications. Crest pads dispensed.   Helane Gunther DPM

## 2023-12-25 DIAGNOSIS — H524 Presbyopia: Secondary | ICD-10-CM | POA: Diagnosis not present

## 2023-12-27 DIAGNOSIS — M459 Ankylosing spondylitis of unspecified sites in spine: Secondary | ICD-10-CM | POA: Diagnosis not present

## 2023-12-27 DIAGNOSIS — M25561 Pain in right knee: Secondary | ICD-10-CM | POA: Diagnosis not present

## 2023-12-27 DIAGNOSIS — Z79899 Other long term (current) drug therapy: Secondary | ICD-10-CM | POA: Diagnosis not present

## 2023-12-27 DIAGNOSIS — M8580 Other specified disorders of bone density and structure, unspecified site: Secondary | ICD-10-CM | POA: Diagnosis not present

## 2023-12-27 DIAGNOSIS — M25562 Pain in left knee: Secondary | ICD-10-CM | POA: Diagnosis not present

## 2023-12-27 DIAGNOSIS — M545 Low back pain, unspecified: Secondary | ICD-10-CM | POA: Diagnosis not present

## 2024-01-03 NOTE — Addendum Note (Signed)
 Addended by: Elease Etienne A on: 01/03/2024 03:31 PM   Modules accepted: Orders

## 2024-01-03 NOTE — Progress Notes (Signed)
 Remote pacemaker transmission.

## 2024-01-09 ENCOUNTER — Ambulatory Visit

## 2024-01-10 DIAGNOSIS — D692 Other nonthrombocytopenic purpura: Secondary | ICD-10-CM | POA: Diagnosis not present

## 2024-01-10 DIAGNOSIS — E782 Mixed hyperlipidemia: Secondary | ICD-10-CM | POA: Diagnosis not present

## 2024-01-10 DIAGNOSIS — I130 Hypertensive heart and chronic kidney disease with heart failure and stage 1 through stage 4 chronic kidney disease, or unspecified chronic kidney disease: Secondary | ICD-10-CM | POA: Diagnosis not present

## 2024-01-10 DIAGNOSIS — I5022 Chronic systolic (congestive) heart failure: Secondary | ICD-10-CM | POA: Diagnosis not present

## 2024-01-10 DIAGNOSIS — R7303 Prediabetes: Secondary | ICD-10-CM | POA: Diagnosis not present

## 2024-01-10 DIAGNOSIS — I739 Peripheral vascular disease, unspecified: Secondary | ICD-10-CM | POA: Diagnosis not present

## 2024-01-10 DIAGNOSIS — N182 Chronic kidney disease, stage 2 (mild): Secondary | ICD-10-CM | POA: Diagnosis not present

## 2024-01-10 DIAGNOSIS — I700 Atherosclerosis of aorta: Secondary | ICD-10-CM | POA: Diagnosis not present

## 2024-01-22 ENCOUNTER — Other Ambulatory Visit (INDEPENDENT_AMBULATORY_CARE_PROVIDER_SITE_OTHER)

## 2024-01-22 ENCOUNTER — Other Ambulatory Visit (HOSPITAL_COMMUNITY): Payer: Self-pay | Admitting: Internal Medicine

## 2024-01-22 ENCOUNTER — Encounter: Payer: Self-pay | Admitting: Internal Medicine

## 2024-01-22 ENCOUNTER — Ambulatory Visit: Payer: Medicare Other | Admitting: Internal Medicine

## 2024-01-22 VITALS — BP 122/72 | Ht 70.0 in | Wt 156.0 lb

## 2024-01-22 DIAGNOSIS — R1310 Dysphagia, unspecified: Secondary | ICD-10-CM

## 2024-01-22 DIAGNOSIS — D509 Iron deficiency anemia, unspecified: Secondary | ICD-10-CM | POA: Diagnosis not present

## 2024-01-22 DIAGNOSIS — K219 Gastro-esophageal reflux disease without esophagitis: Secondary | ICD-10-CM

## 2024-01-22 DIAGNOSIS — Z7902 Long term (current) use of antithrombotics/antiplatelets: Secondary | ICD-10-CM

## 2024-01-22 DIAGNOSIS — I739 Peripheral vascular disease, unspecified: Secondary | ICD-10-CM | POA: Diagnosis not present

## 2024-01-22 DIAGNOSIS — R059 Cough, unspecified: Secondary | ICD-10-CM

## 2024-01-22 DIAGNOSIS — Z860101 Personal history of adenomatous and serrated colon polyps: Secondary | ICD-10-CM

## 2024-01-22 LAB — CBC
HCT: 43.8 % (ref 39.0–52.0)
Hemoglobin: 15 g/dL (ref 13.0–17.0)
MCHC: 34.3 g/dL (ref 30.0–36.0)
MCV: 100.2 fl — ABNORMAL HIGH (ref 78.0–100.0)
Platelets: 145 10*3/uL — ABNORMAL LOW (ref 150.0–400.0)
RBC: 4.37 Mil/uL (ref 4.22–5.81)
RDW: 13.5 % (ref 11.5–15.5)
WBC: 4.8 10*3/uL (ref 4.0–10.5)

## 2024-01-22 LAB — IBC + FERRITIN
Ferritin: 46 ng/mL (ref 22.0–322.0)
Iron: 109 ug/dL (ref 42–165)
Saturation Ratios: 44.2 % (ref 20.0–50.0)
TIBC: 246.4 ug/dL — ABNORMAL LOW (ref 250.0–450.0)
Transferrin: 176 mg/dL — ABNORMAL LOW (ref 212.0–360.0)

## 2024-01-22 NOTE — Progress Notes (Signed)
 01/22/2024 Henry Neal [id number] [birth year]  ASSESSMENT AND PLAN:  IDA, on plavix Patient's blood counts most recently showed that his anemia has resolved on iron supplements.  This is relatively reassuring.  Will recheck his blood counts and iron levels today to see if he still needs to remain on iron supplements.  Since patient's IDA has resolved, will hold off on endoscopic evaluation for now. 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  His recent barium swallow was normal.  He does have a history of EGD with esophageal dilation years ago.  Will refer to speech therapy for MBS and refer to ENT since patient states that at times food particles seem to come from his nose.  Pending results of these referrals, can decide if EGD is still necessary or not.  Patient is of advanced age but is still quite functional at his age so I think he could be a candidate for EGD in the future if this is desired and could potentially be beneficial.  Patient's kyphosis may also be playing a role in his dysphagia. - Cont once daily pantoprazole - Speech therapy referral for MBS due to pill dysphagia - Patient requested that he be referred to ENT - RTC 3 months  History of adenomatous polyp of colon 05/2009 colonoscopy one adenomatous polyp  PAD (peripheral artery disease) (HCC) On plavix   Chronic systolic CHF (congestive heart failure) S/p AICD  Patient Care Team: Georgianne Fick, MD as PCP - General (Internal Medicine) Thurmon Fair, MD as Attending Physician (Cardiology) Laurey Morale, MD as Consulting Physician (Cardiology) Cindee Salt, MD as Consulting Physician (Orthopedic Surgery) Marinus Maw, MD as Consulting Physician (Cardiology) Helane Gunther, DPM as Consulting Physician  (Podiatry) Stacey Drain, MD as Consulting Physician (Rheumatology)  HISTORY OF PRESENT ILLNESS: 89 y.o. male with a past medical history of CAD, PAD, hypertension dyslipidemia, AAA status post stent 2010, GERD, systolic heart failure echo 11/08/2022 EF 45 to 50% presents for follow up of dysphagia and GERD  Used to see [facility], Dr. Thurston Hole.  He has pathology report from colonoscopy 05/15/2004 hyperplastic polyp, and colonoscopy 05/2009 adenomatous polyp, no high grade dysplasia.  He is on plavix daily for history of CAD   He lives by himself, he drives still, he does his own ADLS.   02/10/2019 CT abdomen pelvis secondary to fall shows small hiatal hernia, diverticulosis, prior aortic stent graft repair of AAA, small bilateral renal cyst Labs reviewed from 07/09/2022 show normocytic anemia Hgb 12.6, MCV 98, thrombocytopenia platelets 144, no leukocytosis.  CT Chest/abdomen/pelvis w/contrast 01/25/23: IMPRESSION: 1. No evidence of acute traumatic injury in the chest, abdomen, or pelvis.  Barium swallow 02/06/23: IMPRESSION: 1.  Mild esophageal dysmotility. 2. Small hiatal hernia. No evidence of gastroesophageal reflux or esophageal stricture.  Interval History: He has been doing well. Stools are dark due to being on the oral iron supplements. Denies hematochezia. Denies SOB or lightheadedness. Swallowing has been pretty good. When he takes pills, sometimes it feels like things get hung in the throat area. He thinks that his posture may play a role in his difficulty with swallowing. The Protonix is still helping with his GERD. One time he ate an oatmeal cookie and a small piece of it came out of his nose.  Wt Readings from Last 6  Encounters:  01/22/24 156 lb (70.8 kg)  10/04/23 154 lb 9.6 oz (70.1 kg)  09/06/23 156 lb 8 oz (71 kg)  06/20/23 158 lb 6.4 oz (71.8 kg)  03/25/23 152 lb (68.9 kg)  03/22/23 160 lb 6.4 oz (72.8 kg)    He  reports that he quit smoking about  46 years ago. His smoking use included cigarettes. He started smoking about 76 years ago. He has a 60 pack-year smoking history. He has never been exposed to tobacco smoke. He quit smokeless tobacco use about 39 years ago.  His smokeless tobacco use included chew. He reports current alcohol use of about 7.0 standard drinks of alcohol per week. He reports that he does not use drugs.  RELEVANT LABS AND IMAGING: CBC    Component Value Date/Time   WBC 6.7 10/04/2023 0941   RBC 4.25 10/04/2023 0941   HGB 14.1 10/04/2023 0941   HCT 41.7 10/04/2023 0941   PLT 170 10/04/2023 0941   MCV 98.1 10/04/2023 0941   MCH 33.2 10/04/2023 0941   MCHC 33.8 10/04/2023 0941   RDW 12.3 10/04/2023 0941   LYMPHSABS 1.5 03/25/2023 1415   MONOABS 0.5 03/25/2023 1415   EOSABS 0.1 03/25/2023 1415   BASOSABS 0.0 03/25/2023 1415   Recent Labs    01/25/23 2012 01/25/23 2057 01/30/23 1011 02/28/23 1106 03/25/23 1415 10/04/23 0941  HGB 12.4* 12.6* 12.3* 11.9* 12.6* 14.1     CMP     Component Value Date/Time   NA 136 10/04/2023 0941   K 3.4 (L) 10/04/2023 0941   CL 104 10/04/2023 0941   CO2 24 10/04/2023 0941   GLUCOSE 112 (H) 10/04/2023 0941   BUN 18 10/04/2023 0941   CREATININE 0.90 10/04/2023 0941   CREATININE 1.11 10/09/2013 1307   CALCIUM 9.1 10/04/2023 0941   PROT 5.8 (L) 06/20/2023 1527   ALBUMIN 3.4 (L) 06/20/2023 1527   AST 28 06/20/2023 1527   ALT 22 06/20/2023 1527   ALKPHOS 62 06/20/2023 1527   BILITOT 0.7 06/20/2023 1527   GFRNONAA >60 10/04/2023 0941   GFRAA 57 (L) 05/30/2020 1144      Latest Ref Rng & Units 06/20/2023    3:27 PM 03/25/2023    2:15 PM 10/23/2013   11:27 AM  Hepatic Function  Total Protein 6.5 - 8.1 g/dL 5.8  6.6  7.3   Albumin 3.5 - 5.0 g/dL 3.4  4.0  4.0   AST 15 - 41 U/L 28  23  25    ALT 0 - 44 U/L 22  15  27    Alk Phosphatase 38 - 126 U/L 62  53  70   Total Bilirubin 0.3 - 1.2 mg/dL 0.7  0.5  0.4       Current Medications:   Current Outpatient  Medications (Endocrine & Metabolic):    dapagliflozin propanediol (FARXIGA) 10 MG TABS tablet, Take 1 tablet (10 mg total) by mouth daily before breakfast.  Current Outpatient Medications (Cardiovascular):    atorvastatin (LIPITOR) 40 MG tablet, Take 1 tablet (40 mg total) by mouth daily at 6 PM.   carvedilol (COREG) 12.5 MG tablet, Take 1 tablet (12.5 mg total) by mouth 2 (two) times daily with a meal.   losartan (COZAAR) 25 MG tablet, Take 1 tablet (25 mg total) by mouth daily.   nitroGLYCERIN (NITROSTAT) 0.4 MG SL tablet, PLACE 1 TABLET UNDER THE TONGUE IF NEEDED EVERY 5 MINUTES FOR CHEST PAIN FOR 3 DOSES IF NO RELIEF AFTER FIRST DOSE CALL PRESCRIBER OR  911   spironolactone (ALDACTONE) 25 MG tablet, Take 1 tablet (25 mg total) by mouth daily.   furosemide (LASIX) 20 MG tablet, Take 1 tablet (20 mg total) by mouth as needed for fluid or edema (for a weight gain of 3-5 in a week.). (Patient not taking: Reported on 01/22/2024)  Current Outpatient Medications (Respiratory):    fluticasone (FLONASE) 50 MCG/ACT nasal spray, Place 1 spray into both nostrils daily as needed for allergies.   Current Outpatient Medications (Analgesics):    acetaminophen (TYLENOL) 500 MG tablet, Take 1,000 mg by mouth daily as needed for mild pain or headache. Reported on 01/25/2016   meloxicam (MOBIC) 15 MG tablet, Take 15 mg by mouth daily as needed for pain. Pt taking as needed  Current Outpatient Medications (Hematological):    clopidogrel (PLAVIX) 75 MG tablet, TAKE 1 TABLET(75 MG) BY MOUTH DAILY   FEROSUL 325 (65 Fe) MG tablet, TAKE 1 TABLET BY MOUTH EVERY DAY WITH BREAKFAST   folic acid (FOLVITE) 1 MG tablet, Take 1 mg by mouth daily.  Current Outpatient Medications (Other):    Calcium Carbonate-Vitamin D (CALCIUM 600 + D PO), Take 1 tablet by mouth 2 (two) times daily.   clotrimazole (LOTRIMIN) 1 % cream, Apply 1 Application topically as needed.   finasteride (PROSCAR) 5 MG tablet, take 1 tablet by mouth once  daily   gabapentin (NEURONTIN) 300 MG capsule, Take 300 mg by mouth 2 (two) times daily.   meclizine (ANTIVERT) 25 MG tablet, Take 1 tablet (25 mg total) by mouth 2 (two) times daily as needed for dizziness.   methotrexate (RHEUMATREX) 2.5 MG tablet, Take 17.5 mg by mouth once a week. Caution:Chemotherapy. Protect from light.7 tablets   mometasone (ELOCON) 0.1 % cream, Apply 1 application topically daily as needed (for irritation).    Multiple Vitamin (MULTIVITAMIN PO), Take 1 tablet by mouth daily. ALIVE men's vitamin   pantoprazole (PROTONIX) 40 MG tablet, Take 40 mg by mouth daily.   Secukinumab (COSENTYX SENSOREADY PEN) 150 MG/ML SOAJ, Inject 150 mg into the skin every 28 (twenty-eight) days.   sulfaSALAzine (AZULFIDINE) 500 MG tablet, Take 1,500 mg by mouth 2 (two) times daily.  Medical History:  Past Medical History:  Diagnosis Date   AAA (abdominal aortic aneurysm) (HCC)    Abnormal stress test    Allergic rhinitis    uses Flonase daily   Anginal pain (HCC)    pain in neck,shoulders,down arms occ   Arthritis    Cancer (HCC)    skin   Carotid artery occlusion    Chronic venous insufficiency    Coronary artery disease    Dizziness    takes Antivert daily as needed   ED (erectile dysfunction)    GERD (gastroesophageal reflux disease)    takes Omeprazole daily   Hiatal hernia    Hx of echocardiogram 04/04/2010   showed a borderline dilatd left ventricle with mild left ventricle hypertrophy and an EF 50-55% doppler suggested diastolic dysfunction, although the pattern is probably normal for an 89 yrs old. The left atrium was indeed mildly dilated at 42 mm, but there are no significant valvular abnormalities.    Hyperlipidemia    Hypertension    takes Proscar,Dyazide,Avapro,and Metoprolol daily   Myocardial infarction [facility]) Oct. 11, 2016   Heart Attack   Palpitation    Shortness of breath    Sigmoid diverticulosis    T12 compression fracture (HCC)    Thyroid disease     Allergies:  Allergies  Allergen  Reactions   Doxepin Other (See Comments)    hallucinations  hallucinations Hallucinations  hallucinations Hallucinations hallucinations Hallucinations   Sinequan [Doxepin Hcl] Other (See Comments)    hallucinations   Meloxicam Other (See Comments)   Verapamil Other (See Comments)    Caused heart to race Caused heart to race  Caused heart to race Caused heart to race   Procaine Itching and Other (See Comments)    Tingling all over as soon as it was injected  Tingling all over as soon as it was injected Tingling all over as soon as it was injected     Surgical History:  He  has a past surgical history that includes Rotator cuff repair (2003); Abdominal aortic aneurysm repair (02/24/2009); Hernia repair (Bilateral); Hand surgery (Right); Endarterectomy (Right, 10/27/2013); melanoma removed; left heart catheterization with coronary angiogram (N/A, 10/12/2013); Cardiac catheterization (N/A, 08/04/2015); Cardiac catheterization (N/A, 08/04/2015); Cardiac catheterization (N/A, 08/08/2015); and Cardiac catheterization (N/A, 01/26/2016). Family History:  His family history includes COPD in his mother; Diabetes in his mother; Heart disease in his mother; Hyperlipidemia in his mother; Hypertension in his mother.  PHYSICAL EXAM: BP 122/72   Ht 5\' 10"  (1.778 m)   Wt 156 lb (70.8 kg)   BMI 22.38 kg/m  General Appearance: elderly but well appearing male in no apparent distress. Head:   Normocephalic and atraumatic. Severe cervical kyphosis and limited neck ROM Eyes:  sclerae anicteric,conjunctive pink  Respiratory: Respiratory effort normal, BS equal bilaterally without rales, rhonchi, wheezing. Cardio: RRR with no MRGs. Peripheral pulses intact.  Abdomen: Soft,  Obese ,active bowel sounds. No tenderness Musculoskeletal: Full ROM, Antalgic gait but able to get on table with assistance. Without edema. Skin:  Dry and intact without significant lesions or  rashes Neuro: Alert and  oriented x4;  No focal deficits. Psych:  Cooperative. Normal mood and affect.    Imogene Burn, MD 8:53 AM  I spent 35 minutes of time, including in depth chart review, independent review of results as outlined above, communicating results with the patient directly, face-to-face time with the patient, coordinating care, and ordering studies and medications as appropriate, and documentation.

## 2024-01-22 NOTE — Patient Instructions (Signed)
 Your provider has requested that you go to the basement level for lab work before leaving today. Press "B" on the elevator. The lab is located at the first door on the left as you exit the elevator.  You have been scheduled for a modified barium swallow on 02/04/24 at 11 am. Please arrive 10:45 am minutes prior to your test for registration. You will go to  [facility] (1st Floor) for your appointment. Should you need to cancel or reschedule your appointment, please contact [phone] [facility]) or [phone] [facility]). _____________________________________________________________________ A Modified Barium Swallow Study, or MBS, is a special x-ray that is taken to check swallowing skills. It is carried out by a Marine scientist and a Warehouse manager (SLP). During this test, yourmouth, throat, and esophagus, a muscular tube which connects your mouth to your stomach, is checked. The test will help you, your doctor, and the SLP plan what types of foods and liquids are easier for you to swallow. The SLP will also identify positions and ways to help you swallow more easily and safely. What will happen during an MBS? You will be taken to an x-ray room and seated comfortably. You will be asked to swallow small amounts of food and liquid mixed with barium. Barium is a liquid or paste that allows images of your mouth, throat and esophagus to be seen on x-ray. The x-ray captures moving images of the food you are swallowing as it travels from your mouth through your throat and into your esophagus. This test helps identify whether food or liquid is entering your lungs (aspiration). The test also shows which part of your mouth or throat lacks strength or coordination to move the food or liquid in the right direction. This test typically takes 30 minutes to 1 hour to complete. _______________________________________________________________________   If your blood pressure at your  visit was 140/90 or greater, please contact your primary care physician to follow up on this.  _______________________________________________________  If you are age 46 or older, your body mass index should be between 23-30. Your Body mass index is 22.38 kg/m. If this is out of the aforementioned range listed, please consider follow up with your Primary Care Provider.  If you are age 56 or younger, your body mass index should be between 19-25. Your Body mass index is 22.38 kg/m. If this is out of the aformentioned range listed, please consider follow up with your Primary Care Provider.   ________________________________________________________  The [facility] providers would like to encourage you to use [facility] to communicate with providers for non-urgent requests or questions.  Due to long hold times on the telephone, sending your provider a message by [facility] may be a faster and more efficient way to get a response.  Please allow 48 business hours for a response.  Please remember that this is for non-urgent requests.  _______________________________________________________ Due to recent changes in healthcare laws, you may see the results of your imaging and laboratory studies on MyChart before your provider has had a chance to review them.  We understand that in some cases there may be results that are confusing or concerning to you. Not all laboratory results come back in the same time frame and the provider may be waiting for multiple results in order to interpret others.  Please give [place] 48 hours in order for your provider to thoroughly review all the results before contacting the office for clarification of your results.   Thank you for entrusting me  with your care and for choosing [facility],  Dr. Eulah Pont

## 2024-01-24 DIAGNOSIS — Z0000 Encounter for general adult medical examination without abnormal findings: Secondary | ICD-10-CM | POA: Diagnosis not present

## 2024-02-04 ENCOUNTER — Ambulatory Visit (HOSPITAL_COMMUNITY)
Admission: RE | Admit: 2024-02-04 | Discharge: 2024-02-04 | Disposition: A | Discharge status: Home / Self Care | Source: Ambulatory Visit | Attending: Internal Medicine | Admitting: Internal Medicine

## 2024-02-04 DIAGNOSIS — R0989 Other specified symptoms and signs involving the circulatory and respiratory systems: Secondary | ICD-10-CM | POA: Diagnosis not present

## 2024-02-04 DIAGNOSIS — I10 Essential (primary) hypertension: Secondary | ICD-10-CM | POA: Diagnosis not present

## 2024-02-04 DIAGNOSIS — R059 Cough, unspecified: Secondary | ICD-10-CM | POA: Insufficient documentation

## 2024-02-04 DIAGNOSIS — I252 Old myocardial infarction: Secondary | ICD-10-CM | POA: Insufficient documentation

## 2024-02-04 DIAGNOSIS — R1310 Dysphagia, unspecified: Secondary | ICD-10-CM | POA: Insufficient documentation

## 2024-02-04 DIAGNOSIS — E785 Hyperlipidemia, unspecified: Secondary | ICD-10-CM | POA: Insufficient documentation

## 2024-02-04 DIAGNOSIS — Z85828 Personal history of other malignant neoplasm of skin: Secondary | ICD-10-CM | POA: Insufficient documentation

## 2024-02-04 DIAGNOSIS — I2510 Atherosclerotic heart disease of native coronary artery without angina pectoris: Secondary | ICD-10-CM | POA: Insufficient documentation

## 2024-02-04 DIAGNOSIS — R638 Other symptoms and signs concerning food and fluid intake: Secondary | ICD-10-CM | POA: Diagnosis not present

## 2024-02-04 DIAGNOSIS — K219 Gastro-esophageal reflux disease without esophagitis: Secondary | ICD-10-CM

## 2024-02-04 NOTE — Evaluation (Signed)
 Modified Barium Swallow Study  Patient Details  Name: Henry Neal MRN: [id number] Date of Birth: Aug 20, 1927  Today's Date: 02/04/2024  HPI/PMH: HPI: [name] is a 89 yo M with PMHx significant for: MI, allergic rhinitis, skin cancer, CAD, GERD, hiatal hernia, HLD, HTN, and thyroid disease. He presents for a Modified Barium Swallow Study to assess oropharyngeal swallowing function by recommendation of Dr. Leonides Schanz with Gastroenterology. Per GI note dated 01/22/24, "Patient still has some dysphagia mostly to pills and some solid foods.  His recent barium swallow was normal.  He does have a history of EGD with esophageal dilation years ago...patient states that at times food particles seem to come from his nose.  Pending results of these referrals, can decide if EGD is still necessary or not.Marland KitchenMarland KitchenMarland KitchenPatient's kyphosis may also be playing a role in his dysphagia."   Clinical Impression: Clinical Impression: Patient presents with oropharyngeal swallowing function that is grossly within functional limits, especially considering patient's age and kyphotic habitus. Patient expresses concerns with swallowing large pills, noting globus sensation at level of the upper neck. Across all thin liquid, puree, and solid trials, patient exhibited near complete pharyngeal clearance and no instances of penetration/aspiration. When taking pill with thin liquids, noted penetration above the level of the vocal folds which did not clear with completion of the swallow but did eventually clear with spontaneous subsequent swallows/throat clearing. Pill did remain in the pharynx after first swallow but was quickly moved into the esophagus with reflexive subsequent swallow. Recommend continuation of regular/thin liquid diet at home. SLP encouraged patient to take medications whole in puree, especially larger medications as they seem to cause him the most subjective difficulty per report. SLP also provided education re: referred  sensation given history of needing esophageal dilation. No further SLP services indicated at this time.  Factors that may increase risk of adverse event in presence of aspiration [name] & [name] 2021): No data recorded  Recommendations/Plan: Swallowing Evaluation Recommendations Swallowing Evaluation Recommendations Recommendations: PO diet PO Diet Recommendation: Regular; Thin liquids (Level 0) Liquid Administration via: Cup; Straw Medication Administration: Whole meds with puree Supervision: Patient able to self-feed Postural changes: Position pt fully upright for meals; Stay upright 30-60 min after meals Oral care recommendations: Oral care BID (2x/day) Recommended consults: Consider ENT consultation; Consider esophageal assessment    Treatment Plan Treatment Plan Treatment recommendations: No treatment recommended at this time Follow-up recommendations: No SLP follow up Functional status assessment: Patient has not had a recent decline in their functional status.     Recommendations Recommendations for follow up therapy are one component of a multi-disciplinary discharge planning process, led by the attending physician.  Recommendations may be updated based on patient status, additional functional criteria and insurance authorization.  Assessment: Orofacial Exam: Orofacial Exam Oral Cavity: Oral Hygiene: WFL Oral Cavity - Dentition: -- (adequate for mastication) Orofacial Anatomy: WFL Oral Motor/Sensory Function: WFL    Anatomy:  Anatomy: -- (kyphosis)   Boluses Administered: Boluses Administered Boluses Administered: Thin liquids (Level 0); Puree; Solid     Oral Impairment Domain: Oral Impairment Domain Lip Closure: No labial escape Tongue control during bolus hold: Cohesive bolus between tongue to palatal seal Bolus preparation/mastication: Timely and efficient chewing and mashing Bolus transport/lingual motion: Brisk tongue motion Oral residue:  Complete oral clearance Location of oral residue : N/A     Pharyngeal Impairment Domain: Pharyngeal Impairment Domain Soft palate elevation: No bolus between soft palate (SP)/pharyngeal wall (PW) Laryngeal elevation: Complete superior movement of thyroid cartilage  with complete approximation of arytenoids to epiglottic petiole Anterior hyoid excursion: Complete anterior movement Epiglottic movement: Partial inversion (abutted against posterior pharyngeal wall d/t kyphosis) Laryngeal vestibule closure: Incomplete, narrow column air/contrast in laryngeal vestibule Pharyngeal stripping wave : Present - complete Pharyngeal contraction (A/P view only): N/A Pharyngoesophageal segment opening: Partial distention/partial duration, partial obstruction of flow Tongue base retraction: No contrast between tongue base and posterior pharyngeal wall (PPW) Pharyngeal residue: Trace residue within or on pharyngeal structures Location of pharyngeal residue: Valleculae     Esophageal Impairment Domain: Esophageal Impairment Domain Esophageal clearance upright position: Complete clearance, esophageal coating    Pill: Pill Consistency administered: Thin liquids (Level 0) Thin liquids (Level 0): [facility]    Penetration/Aspiration Scale Score: Penetration/Aspiration Scale Score 1.  Material does not enter airway: Puree; Solid; Thin liquids (Level 0) 3.  Material enters airway, remains ABOVE vocal cords and not ejected out: Pill (thin liquids during pill administration only)    Compensatory Strategies: Compensatory Strategies Compensatory strategies: No       General Information: Caregiver present: No   Diet Prior to this Study: Regular; Thin liquids (Level 0)    Temperature : Normal    Respiratory Status: WFL    Supplemental O2: None (Room air)    History of Recent Intubation: No   Behavior/Cognition: Alert; Cooperative; Pleasant mood  Self-Feeding Abilities: Able to  self-feed  Baseline vocal quality/speech: Dysphonic  Volitional Cough: Able to elicit  Volitional Swallow: Able to elicit  Exam Limitations: No limitations   Goal Planning: No data recorded No data recorded No data recorded No data recorded Consulted and agree with results and recommendations: Patient   Pain: Pain Assessment Pain Assessment: No/denies pain    End of Session: Start Time:SLP Start Time (ACUTE ONLY): 1055  Stop Time: SLP Stop Time (ACUTE ONLY): 1115  Time Calculation:SLP Time Calculation (min) (ACUTE ONLY): 20 min  Charges: SLP Evaluations $ SLP Speech Visit: 1 Visit  SLP Evaluations $Outpatient MBS Swallow: 1 Procedure   SLP visit diagnosis: SLP Visit Diagnosis: Dysphagia, unspecified (R13.10)    Past Medical History:  Past Medical History:  Diagnosis Date   AAA (abdominal aortic aneurysm) (HCC)    Abnormal stress test    Allergic rhinitis    uses Flonase daily   Anginal pain (HCC)    pain in neck,shoulders,down arms occ   Arthritis    Cancer (HCC)    skin   Carotid artery occlusion    Chronic venous insufficiency    Coronary artery disease    Dizziness    takes Antivert daily as needed   ED (erectile dysfunction)    GERD (gastroesophageal reflux disease)    takes Omeprazole daily   Hiatal hernia    Hx of echocardiogram 04/04/2010   showed a borderline dilatd left ventricle with mild left ventricle hypertrophy and an EF 50-55% doppler suggested diastolic dysfunction, although the pattern is probably normal for an 89 yrs old. The left atrium was indeed mildly dilated at 42 mm, but there are no significant valvular abnormalities.    Hyperlipidemia    Hypertension    takes Proscar,Dyazide,Avapro,and Metoprolol daily   Myocardial infarction [facility]) Oct. 11, 2016   Heart Attack   Palpitation    Shortness of breath    Sigmoid diverticulosis    T12 compression fracture [facility])    Thyroid disease    Past Surgical History:  Past Surgical  History:  Procedure Laterality Date   ABDOMINAL AORTIC ANEURYSM REPAIR  02/24/2009   AAA  Stenting   CARDIAC CATHETERIZATION N/A 08/04/2015   Procedure: Right/Left Heart Cath and Coronary Angiography;  Surgeon: Darlis Eisenmenger, MD;  Location: [facility] INVASIVE CV LAB;  Service: Cardiovascular;  Laterality: N/A;   CARDIAC CATHETERIZATION N/A 08/04/2015   Procedure: Coronary Stent Intervention;  Surgeon: Odie Benne, MD;  Location: [facility] INVASIVE CV LAB;  Service: Cardiovascular;  Laterality: N/A;   CARDIAC CATHETERIZATION N/A 08/08/2015   Procedure: Coronary Stent Intervention;  Surgeon: Peter M Sw[place], MD;  Location: [facility] INVASIVE CV LAB;  Service: Cardiovascular;  Laterality: N/A;   ENDARTERECTOMY Right 10/27/2013   Procedure: ENDARTERECTOMY CAROTID;  Surgeon: Richrd Char, MD;  Location: [facility] OR;  Service: Vascular;  Laterality: Right;   EP IMPLANTABLE DEVICE N/A 01/26/2016   Procedure: BiV Pacemaker Insertion CRT-P;  Surgeon: Tammie Fall, MD;  Location: [facility] INVASIVE CV LAB;  Service: Cardiovascular;  Laterality: N/A;   HAND SURGERY Right    tendon, lft 90's   HERNIA REPAIR Bilateral    LEFT HEART CATHETERIZATION WITH CORONARY ANGIOGRAM N/A 10/12/2013   Procedure: LEFT HEART CATHETERIZATION WITH CORONARY ANGIOGRAM;  Surgeon: Avanell Leigh, MD;  Location[facility] LAB;  Service: Cardiovascular;  Laterality: N/A;   melanoma removed     ROTATOR CUFF REPAIR  2003   left arm   Dorla Gartner, M.A., CCC-SLP  Jaeson Molstad A Lus Kriegel 02/04/2024, 11:48 AM

## 2024-02-06 ENCOUNTER — Telehealth (HOSPITAL_COMMUNITY): Payer: Self-pay

## 2024-02-06 NOTE — Telephone Encounter (Signed)
 Called to confirm/remind patient of their appointment at the [facility] on 02/07/24***.   Appointment:   [x] Confirmed  [] Left mess   [] No answer/No voice mail  [] Phone not in service  Patient reminded to bring all medications and/or complete list.  Confirmed patient has transportation. Gave directions, instructed to utilize valet parking.

## 2024-02-07 ENCOUNTER — Ambulatory Visit (HOSPITAL_COMMUNITY)
Admission: RE | Admit: 2024-02-07 | Discharge: 2024-02-07 | Disposition: A | Discharge status: Home / Self Care | Payer: Medicare Other | Source: Ambulatory Visit | Attending: Cardiology | Admitting: Cardiology

## 2024-02-07 ENCOUNTER — Encounter (HOSPITAL_COMMUNITY): Payer: Self-pay

## 2024-02-07 ENCOUNTER — Telehealth (HOSPITAL_COMMUNITY): Payer: Self-pay | Admitting: *Deleted

## 2024-02-07 VITALS — BP 112/68 | HR 61 | Wt 156.4 lb

## 2024-02-07 DIAGNOSIS — Z955 Presence of coronary angioplasty implant and graft: Secondary | ICD-10-CM | POA: Insufficient documentation

## 2024-02-07 DIAGNOSIS — I252 Old myocardial infarction: Secondary | ICD-10-CM | POA: Diagnosis not present

## 2024-02-07 DIAGNOSIS — I447 Left bundle-branch block, unspecified: Secondary | ICD-10-CM | POA: Diagnosis not present

## 2024-02-07 DIAGNOSIS — I2510 Atherosclerotic heart disease of native coronary artery without angina pectoris: Secondary | ICD-10-CM | POA: Insufficient documentation

## 2024-02-07 DIAGNOSIS — R1310 Dysphagia, unspecified: Secondary | ICD-10-CM | POA: Insufficient documentation

## 2024-02-07 DIAGNOSIS — Z87891 Personal history of nicotine dependence: Secondary | ICD-10-CM | POA: Insufficient documentation

## 2024-02-07 DIAGNOSIS — I255 Ischemic cardiomyopathy: Secondary | ICD-10-CM | POA: Diagnosis not present

## 2024-02-07 DIAGNOSIS — I5022 Chronic systolic (congestive) heart failure: Secondary | ICD-10-CM

## 2024-02-07 DIAGNOSIS — M459 Ankylosing spondylitis of unspecified sites in spine: Secondary | ICD-10-CM | POA: Insufficient documentation

## 2024-02-07 DIAGNOSIS — Z7902 Long term (current) use of antithrombotics/antiplatelets: Secondary | ICD-10-CM | POA: Insufficient documentation

## 2024-02-07 DIAGNOSIS — R070 Pain in throat: Secondary | ICD-10-CM | POA: Diagnosis not present

## 2024-02-07 DIAGNOSIS — Z950 Presence of cardiac pacemaker: Secondary | ICD-10-CM | POA: Insufficient documentation

## 2024-02-07 DIAGNOSIS — Z7984 Long term (current) use of oral hypoglycemic drugs: Secondary | ICD-10-CM | POA: Insufficient documentation

## 2024-02-07 DIAGNOSIS — G8929 Other chronic pain: Secondary | ICD-10-CM | POA: Insufficient documentation

## 2024-02-07 DIAGNOSIS — I7300 Raynaud's syndrome without gangrene: Secondary | ICD-10-CM | POA: Insufficient documentation

## 2024-02-07 DIAGNOSIS — Z79899 Other long term (current) drug therapy: Secondary | ICD-10-CM | POA: Insufficient documentation

## 2024-02-07 LAB — CBC
HCT: 41.8 % (ref 39.0–52.0)
Hemoglobin: 14.1 g/dL (ref 13.0–17.0)
MCH: 33.6 pg (ref 26.0–34.0)
MCHC: 33.7 g/dL (ref 30.0–36.0)
MCV: 99.5 fL (ref 80.0–100.0)
Platelets: 146 10*3/uL — ABNORMAL LOW (ref 150–400)
RBC: 4.2 MIL/uL — ABNORMAL LOW (ref 4.22–5.81)
RDW: 13.2 % (ref 11.5–15.5)
WBC: 7.5 10*3/uL (ref 4.0–10.5)
nRBC: 0 % (ref 0.0–0.2)

## 2024-02-07 LAB — COMPREHENSIVE METABOLIC PANEL WITH GFR
ALT: 22 U/L (ref 0–44)
AST: 23 U/L (ref 15–41)
Albumin: 3.4 g/dL — ABNORMAL LOW (ref 3.5–5.0)
Alkaline Phosphatase: 72 U/L (ref 38–126)
Anion gap: 9 (ref 5–15)
BUN: 12 mg/dL (ref 8–23)
CO2: 27 mmol/L (ref 22–32)
Calcium: 9.1 mg/dL (ref 8.9–10.3)
Chloride: 100 mmol/L (ref 98–111)
Creatinine, Ser: 0.81 mg/dL (ref 0.61–1.24)
GFR, Estimated: >60 mL/min (ref >60)
Glucose, Bld: 94 mg/dL (ref 70–99)
Potassium: 3.4 mmol/L — ABNORMAL LOW (ref 3.5–5.1)
Sodium: 136 mmol/L (ref 135–145)
Total Bilirubin: 0.7 mg/dL (ref 0.0–1.2)
Total Protein: 6.3 g/dL — ABNORMAL LOW (ref 6.5–8.1)

## 2024-02-07 LAB — LIPID PANEL
Cholesterol: 110 mg/dL (ref 0–200)
HDL: 55 mg/dL (ref >40)
LDL Cholesterol: 43 mg/dL (ref 0–99)
Total CHOL/HDL Ratio: 2 ratio
Triglycerides: 62 mg/dL (ref <150)
VLDL: 12 mg/dL (ref 0–40)

## 2024-02-07 MED ORDER — POTASSIUM CHLORIDE CRYS ER 20 MEQ PO TBCR
20.0000 meq | EXTENDED_RELEASE_TABLET | Freq: Every day | ORAL | 3 refills | Status: AC
Start: 2024-02-07 — End: ?

## 2024-02-07 NOTE — Progress Notes (Signed)
 ERROR

## 2024-02-07 NOTE — Telephone Encounter (Signed)
-----   Message from [place] sent at 02/07/2024  4:25 PM EDT ----- K low. Renal fx stable. Start KCl 20 meQ daily and repeat BMP in 1 wk

## 2024-02-07 NOTE — Progress Notes (Signed)
 Patient ID: Henry Neal, male   DOB: 09-03-1927, 89 y.o.   MRN: [id number]     [facility] Note  PCP: Dr. Verdia Cardiology: Dr. Rolan  Reason for Visit: F/u for chronic systolic heart failure  Henry Neal is a 89 y.o. male with history of CAD, chronic systolic CHF, LBBB, AAA s/p repair presents for followup of CHF and CAD.  He had a cardiac cath in 12/15, showing total occlusion of the LAD and 50-60% proximal RCA.  EF at that time was near-normal.  In 10/16, he developed dyspnea and epigastric pain.  He was noted to be in acute pulmonary edema and was diuresed.  Troponin was only mildly elevated at 0.47.  He ultimately underwent LHC showing hazy 80-90% RCA stenosis which was likely the culprit for his presentation.  Additionally, the LAD was only subtotally occluded on this cath.  He had DES to RCA.  Cardiac MRI was done, showing EF 22% with significant viability in LAD territory.  Therefore, CTO of LAD was opened with DES.  Echo in 2/17 showed EF persistently low at 15%.  He had St Jude CRT-P placement in 4/17. Echo in 1/19 showed EF up to 45-50% with mild MR.    Echo 3/20 showed EF 45% with mild diffuse hypokinesis, mildly decreased RV systolic function. Echo in 3/21 showed EF 45-50%, mild RV dilation with normal systolic function. Echo in 10/22 showed EF 50-55%, normal RV, mild MR.    He was diagnosed with ankylosing spondylitis and is now on methotrexate.    Echo 1/24 EF 45-50%, mild RV dysfunction.   He returns today for f/u. Doing well from HF/CAD standpoint. Ambulates w/ a cane. Reports stable NYHA Class II symptoms. No chest pain. BP stable. Compliant w/ meds. No side effects. Minimal positional dizziness if he stands too quickly. No syncope/near syncope. No abnormal bleeding. BP today 112/68. EKG Bi-V paced 60 bpm.   He feels his main limitation is his chronic LBP from Ankylosing spondylitis. He does not take his MXT reguarlly as it aggravates his stomach.    St Jude device interrogation: >99 % BiV pacing, stable thoracic impedence, Afib burden < 1%    ECG (personally reviewed): NSR, BiV pacing, 61 bpm   Labs (1/18): LDL 37 Labs (01/25/2017): K 3.5 Creatinine 0.96  Labs (8/18): BNP 30, K 3.7, creatinine 1.0 Labs (11/18): LDL 40, HDL 47, K 3.5, creatinine 9.03  Labs (3/19): K 4, creatinine 0.97 Labs (11/19): K 3.9, creatinine 1.2, LDL 44 Labs (4/20): K 4.1, creatinine 1.11 Labs (10/20): LDL 46 Labs (11/20): K 4.4, creatinine 1.05 Labs (3/21): K 4.2, creatinine 0.92 Labs (7/21): LDL 58 Labs (8/21): K 4, creatinine 1.27 Labs (11/21): K 3.9, creatinine 0.88 Labs (9/22): K 3.6, creatinine 0.82, LDL 44 Labs (6/23): K 4.6, creatinine 0.86 Labs (9/23): K 4.5, creatinine 0.97, LDL 49, HDL 68 Labs (4/24): K 3.9, SCr 0.80, tSat 16, ferritin 7.1 Labs (5/24): K 3.9, SCr .96, LDL 41 Labs (6/24): K 4, creatinine 0.89  Labs (8/24): BNP 80, K 3.4, creatinine 0.83 Labs (12/24): K 3.4, SCr 0.90   PMH: 1. CKD 2. AAA s/p repair, followed at [facility]. 3. Carotid stenosis: s/p right CEA.   Carotid dopplers (2/16) with right CEA stable, < 40% LICA stenosis.  Followed at [facility].  - Carotid dopplers (3/18): 1-39% LICA, patent right CEA.  4. Chronic LBBB 5. LAD: LHC (12/15) with totally occluded LAD, 50-60% pRCA => medically managed.  10/16 admitted with NSTEMI.  LHC with subtotalled LAD occlusion, 80-90% pRCA with thrombus => DES RCA initially.  Cardiac MRI was done showing viability in the LAD territory, so he had DES to the CTO LAD.   6. Chronic systolic CHF: Ischemic cardiomyopathy.  Echo (10/16) with EF 15% with wall motion abnormalities, moderately dilated RV with normal systolic function.  CMRI (10/16) with severe LV dilation, EF 22% with septal-lateral dyssynchrony, no LV thrombus, normal RV size/systolic function => significant viability noted in the LAD territory.  Echo (2/17): EF 15%, severe LV dilation, wall motion abnormalities noted, mild MR, mildly dilated  RV with normal systolic function, PASP 55 mmHg.  St Jude CRT-P 4/17.  - Echo (1/18): EF 50-55%, mild to moderate MR.  - Echo (1/19): EF 45-50%, mild LVH, inferior hypokinesis, mild MR, PASP 30 mmHg.  - Echo (3/20): EF 45%, diffuse hypokinesis, normal RV size with mildly decreased systolic function.  - Echo (3/21): EF 45-50%, mildly dilated RV with normal systolic function.  - Echo (10/22): EF 50-55%, normal RV, mild MR - Echo (1/24): EF 45-50%, mild RV dysfunction 7. ABIs (10/16) were normal.  8. Raynaud's syndrome.  9. Sciatica  SH: Lives alone, prior smoker, still working as a chartered certified accountant.  FH: CAD  Review of systems complete and found to be negative unless listed in HPI.    Current Outpatient Medications  Medication Sig Dispense Refill   acetaminophen  (TYLENOL ) 500 MG tablet Take 1,000 mg by mouth daily as needed for mild pain or headache. Reported on 01/25/2016     atorvastatin  (LIPITOR) 40 MG tablet Take 1 tablet (40 mg total) by mouth daily at 6 PM. 30 tablet 6   Calcium  Carbonate-Vitamin D (CALCIUM  600 + D PO) Take 1 tablet by mouth 2 (two) times daily.     carvedilol  (COREG ) 12.5 MG tablet Take 1 tablet (12.5 mg total) by mouth 2 (two) times daily with a meal. 180 tablet 3   clopidogrel  (PLAVIX ) 75 MG tablet TAKE 1 TABLET(75 MG) BY MOUTH DAILY 90 tablet 1   clotrimazole (LOTRIMIN) 1 % cream Apply 1 Application topically as needed.     dapagliflozin  propanediol (FARXIGA ) 10 MG TABS tablet Take 1 tablet (10 mg total) by mouth daily before breakfast. 90 tablet 3   FEROSUL 325 (65 Fe) MG tablet TAKE 1 TABLET BY MOUTH EVERY DAY WITH BREAKFAST 30 tablet 3   finasteride  (PROSCAR ) 5 MG tablet take 1 tablet by mouth once daily     fluticasone  (FLONASE ) 50 MCG/ACT nasal spray Place 1 spray into both nostrils daily as needed for allergies.      folic acid  (FOLVITE ) 1 MG tablet Take 1 mg by mouth daily.     furosemide  (LASIX ) 20 MG tablet Take 1 tablet (20 mg total) by mouth as needed for fluid  or edema (for a weight gain of 3-5 in a week.). 15 tablet 6   gabapentin  (NEURONTIN ) 300 MG capsule Take 300 mg by mouth 2 (two) times daily.  0   losartan  (COZAAR ) 25 MG tablet Take 1 tablet (25 mg total) by mouth daily. 90 tablet 3   meclizine  (ANTIVERT ) 25 MG tablet Take 1 tablet (25 mg total) by mouth 2 (two) times daily as needed for dizziness. 30 tablet 0   meloxicam (MOBIC) 15 MG tablet Take 15 mg by mouth daily as needed for pain. Pt taking as needed  0   methotrexate (RHEUMATREX) 2.5 MG tablet Take 17.5 mg by mouth once a week. Caution:Chemotherapy. Protect from light.7 tablets  mometasone  (ELOCON ) 0.1 % cream Apply 1 application topically daily as needed (for irritation).      Multiple Vitamin (MULTIVITAMIN PO) Take 1 tablet by mouth daily. ALIVE men's vitamin     nitroGLYCERIN  (NITROSTAT ) 0.4 MG SL tablet PLACE 1 TABLET UNDER THE TONGUE IF NEEDED EVERY 5 MINUTES FOR CHEST PAIN FOR 3 DOSES IF NO RELIEF AFTER FIRST DOSE CALL PRESCRIBER OR 911 25 tablet 3   pantoprazole  (PROTONIX ) 40 MG tablet Take 40 mg by mouth daily.     Secukinumab (COSENTYX SENSOREADY PEN) 150 MG/ML SOAJ Inject 150 mg into the skin every 28 (twenty-eight) days.     spironolactone  (ALDACTONE ) 25 MG tablet Take 1 tablet (25 mg total) by mouth daily. 90 tablet 3   sulfaSALAzine (AZULFIDINE) 500 MG tablet Take 1,500 mg by mouth 2 (two) times daily.     No current facility-administered medications for this encounter.   BP 112/68   Pulse 61   Wt 70.9 kg (156 lb 6.4 oz)   SpO2 96%   BMI 22.44 kg/m   Wt Readings from Last 3 Encounters:  02/07/24 70.9 kg (156 lb 6.4 oz)  01/22/24 70.8 kg (156 lb)  10/04/23 70.1 kg (154 lb 9.6 oz)    PHYSICAL EXAM: General:  Well appearing, elderly male, kyphotic posture. No respiratory difficulty HEENT: normal Neck: supple. no JVD. Carotids 2+ bilat; no bruits. No lymphadenopathy or thyromegaly appreciated. Cor: PMI nondisplaced. Regular rate & rhythm. No rubs, gallops or  murmurs. Lungs: clear Abdomen: soft, nontender, nondistended. No hepatosplenomegaly. No bruits or masses. Good bowel sounds. Extremities: no cyanosis, clubbing, rash, edema Neuro: alert & oriented x 3, cranial nerves grossly intact. moves all 4 extremities w/o difficulty. Affect pleasant.   Assessment/Plan: 1. Chronic HF with mid range EF: ICM + LBBB. EF previously as low as 15%. Improved since getting CRT-P.  Echo 10/22 showed EF 50-55%.  Echo 1/24 EF 45-50%. Stable NYHA Class II symptoms, Euvolemic on exam and device interrogation. Normal impedence. 99% Bi-V paced.  - Continue Farxiga  10 mg daily. Denies GU symptoms  - Continue Coreg  12.5 mg bid  - Continue spironolactone  25 mg daily.   - Continue losartan  25 mg daily. He did not tolerate Entresto  in the past (low BP and dizziness) - Check CMP  - Echo next visit   2. CAD: NSTEMI with DES to RCA (culprit vessel) and subsequent DES to CTO of LAD (cMRI showed viability in the LAD distribution) in 10/16.  - Denies ischemic chest pain.  - Continue Plavix  75 mg daily. Denies gross bleeding. Also takes Meloxicam PRN for arthritis. On PPI for GI protection. Check CBC today  - On Atorvastatin  40 daily. Check LP/CMP today   3. Carotid stenosis: s/p Rt CEA by Dr. Harvey in 2018. Minimal carotid stenosis on 9/22 dopplers.   - asymptomatic  - continue Plavix  and statin - followed by [facility]   4. Ankylosing spondylitis: Chronic back pain.  - followed by Rheumatology, he admits that he is not entirely compliant w/ his MTX, as it aggravates his stomach.  - instructed to f/u w/ rheum    5. Chronic Throat Pain - Pt requesting referral to ENT - he notes chronic throat pain and difficulty swallowing despite normal swallow test recently  - will place referral   Henry Shed, PA-C  02/07/2024

## 2024-02-07 NOTE — Addendum Note (Signed)
 Encounter addended by: Luiz Sakai, RN on: 02/07/2024 9:48 AM  Actions taken: Clinical Note Signed

## 2024-02-07 NOTE — Patient Instructions (Signed)
 There has been no changes to your medications.  Labs done today, your results will be available in MyChart, we will contact you for abnormal readings.  Your physician recommends that you schedule a follow-up appointment in: 6 months. ( October) ** PLEASE CALL THE OFFICE IN AUGUST TO ARRANGE YOUR FOLLOW UP APPOINTMENT.**  If you have any questions or concerns before your next appointment please send [place] a message through [place] or call our office at [phone].    TO LEAVE A MESSAGE FOR THE NURSE SELECT OPTION 2, PLEASE LEAVE A MESSAGE INCLUDING: YOUR NAME DATE OF BIRTH CALL BACK NUMBER REASON FOR CALL**this is important as we prioritize the call backs  YOU WILL RECEIVE A CALL BACK THE SAME DAY AS LONG AS YOU CALL BEFORE 4:00 PM  At the [facility], you and your health needs are our priority. As part of our continuing mission to provide you with exceptional heart care, we have created designated Provider Care Teams. These Care Teams include your primary Cardiologist (physician) and Advanced Practice Providers (APPs- Physician Assistants and Nurse Practitioners) who all work together to provide you with the care you need, when you need it.   You may see any of the following providers on your designated Care Team at your next follow up: Dr Arvilla Meres Dr Marca Ancona Dr. Dorthula Nettles Dr. Clearnce Hasten Amy Filbert Schilder, NP Robbie Lis, [place] [facility] [place], [place] Brynda Peon, NP [place] Lee, NP Clarisa Kindred, NP Karle Plumber, PharmD Enos Fling, PharmD   Please be sure to bring in all your medications bottles to every appointment.    Thank you for choosing [facility]

## 2024-02-07 NOTE — Telephone Encounter (Signed)
 Pt aware, agreeable, and verbalized understanding, rx sent in, repeat lab sch 4/25

## 2024-02-14 ENCOUNTER — Ambulatory Visit (HOSPITAL_COMMUNITY)
Admission: RE | Admit: 2024-02-14 | Discharge: 2024-02-14 | Disposition: A | Discharge status: Home / Self Care | Source: Ambulatory Visit | Attending: Cardiology | Admitting: Cardiology

## 2024-02-14 DIAGNOSIS — I509 Heart failure, unspecified: Secondary | ICD-10-CM | POA: Diagnosis not present

## 2024-02-19 ENCOUNTER — Encounter: Payer: Self-pay | Admitting: Cardiology

## 2024-02-28 ENCOUNTER — Other Ambulatory Visit (HOSPITAL_COMMUNITY): Payer: Self-pay

## 2024-02-28 ENCOUNTER — Ambulatory Visit: Payer: Medicare Other | Admitting: Podiatry

## 2024-02-28 DIAGNOSIS — I5022 Chronic systolic (congestive) heart failure: Secondary | ICD-10-CM

## 2024-02-28 MED ORDER — LOSARTAN POTASSIUM 25 MG PO TABS: 25.0000 mg | ORAL_TABLET | Freq: Every day | ORAL | 3 refills | Status: AC

## 2024-03-06 ENCOUNTER — Ambulatory Visit (INDEPENDENT_AMBULATORY_CARE_PROVIDER_SITE_OTHER)

## 2024-03-06 ENCOUNTER — Ambulatory Visit: Admitting: Podiatry

## 2024-03-06 ENCOUNTER — Encounter: Payer: Self-pay | Admitting: Podiatry

## 2024-03-06 ENCOUNTER — Ambulatory Visit: Payer: Medicare Other

## 2024-03-06 DIAGNOSIS — B351 Tinea unguium: Secondary | ICD-10-CM

## 2024-03-06 DIAGNOSIS — M79676 Pain in unspecified toe(s): Secondary | ICD-10-CM | POA: Diagnosis not present

## 2024-03-06 DIAGNOSIS — I441 Atrioventricular block, second degree: Secondary | ICD-10-CM | POA: Diagnosis not present

## 2024-03-06 DIAGNOSIS — D689 Coagulation defect, unspecified: Secondary | ICD-10-CM

## 2024-03-06 DIAGNOSIS — I739 Peripheral vascular disease, unspecified: Secondary | ICD-10-CM | POA: Diagnosis not present

## 2024-03-06 NOTE — Progress Notes (Signed)
 This patient returns to my office for at risk foot care.  This patient requires this care by a professional since this patient will be at risk due to having renal insufficiency coagulation defect and PAD.   Patient is taking plavix.    This patient is unable to cut nails himself since the patient cannot reach his nails.These nails are painful walking and wearing shoes.  This patient presents for at risk foot care today.  General Appearance  Alert, conversant and in no acute stress.  Vascular  Dorsalis pedis and posterior tibial  pulses are weakly  palpable  bilaterally.  Capillary return is within normal limits  bilaterally. Temperature is within normal limits  bilaterally.  Neurologic  Senn-Weinstein monofilament wire test within normal limits  bilaterally. Muscle power within normal limits bilaterally.  Nails Thick disfigured discolored nails with subungual debris  from hallux to fifth toes bilaterally. No evidence of bacterial infection or drainage bilaterally.  Orthopedic  No limitations of motion  feet .  No crepitus or effusions noted.  No bony pathology or digital deformities noted.  Skin  normotropic skin with no porokeratosis noted bilaterally.  No signs of infections or ulcers noted.     Onychomycosis  Pain in right toes  Pain in left toes  Consent was obtained for treatment procedures.   Mechanical debridement of nails 1-5  bilaterally performed with a nail nipper.  Filed with dremel without incident.    Return office visit   10 weeks                   Told patient to return for periodic foot care and evaluation due to potential at risk complications. Crest pads dispensed.   Helane Gunther DPM

## 2024-03-09 ENCOUNTER — Ambulatory Visit: Payer: Self-pay | Admitting: Internal Medicine

## 2024-03-09 LAB — CUP PACEART REMOTE DEVICE CHECK
Battery Remaining Longevity: 18 mo
Battery Remaining Percentage: 17 %
Battery Voltage: 2.86 V
Brady Statistic AP VP Percent: 1.7 %
Brady Statistic AP VS Percent: 1 %
Brady Statistic AS VP Percent: 98 %
Brady Statistic AS VS Percent: 1 %
Brady Statistic RA Percent Paced: 1.5 %
Date Time Interrogation Session: 20250516074454
Implantable Lead Connection Status: 753985
Implantable Lead Connection Status: 753985
Implantable Lead Connection Status: 753985
Implantable Lead Implant Date: 20170406
Implantable Lead Implant Date: 20170406
Implantable Lead Implant Date: 20170406
Implantable Lead Location: 753858
Implantable Lead Location: 753859
Implantable Lead Location: 753860
Implantable Pulse Generator Implant Date: 20170406
Lead Channel Impedance Value: 430 Ohm
Lead Channel Impedance Value: 460 Ohm
Lead Channel Impedance Value: 790 Ohm
Lead Channel Pacing Threshold Amplitude: 0.5 V
Lead Channel Pacing Threshold Amplitude: 1.125 V
Lead Channel Pacing Threshold Amplitude: 1.25 V
Lead Channel Pacing Threshold Pulse Width: 0.4 ms
Lead Channel Pacing Threshold Pulse Width: 0.4 ms
Lead Channel Pacing Threshold Pulse Width: 0.4 ms
Lead Channel Sensing Intrinsic Amplitude: 1.7 mV
Lead Channel Sensing Intrinsic Amplitude: 12 mV
Lead Channel Setting Pacing Amplitude: 2 V
Lead Channel Setting Pacing Amplitude: 2.125
Lead Channel Setting Pacing Amplitude: 2.25 V
Lead Channel Setting Pacing Pulse Width: 0.4 ms
Lead Channel Setting Pacing Pulse Width: 0.4 ms
Lead Channel Setting Sensing Sensitivity: 2 mV
Pulse Gen Model: 3262
Pulse Gen Serial Number: 7866838

## 2024-03-10 ENCOUNTER — Other Ambulatory Visit: Payer: Self-pay | Admitting: Physician Assistant

## 2024-03-26 ENCOUNTER — Other Ambulatory Visit: Payer: Self-pay | Admitting: Internal Medicine

## 2024-03-26 NOTE — Telephone Encounter (Signed)
 This is a CHF pt

## 2024-04-02 ENCOUNTER — Telehealth: Payer: Self-pay | Admitting: Internal Medicine

## 2024-04-03 DIAGNOSIS — M8580 Other specified disorders of bone density and structure, unspecified site: Secondary | ICD-10-CM | POA: Diagnosis not present

## 2024-04-03 DIAGNOSIS — M25461 Effusion, right knee: Secondary | ICD-10-CM | POA: Diagnosis not present

## 2024-04-03 DIAGNOSIS — Z79899 Other long term (current) drug therapy: Secondary | ICD-10-CM | POA: Diagnosis not present

## 2024-04-03 DIAGNOSIS — I5022 Chronic systolic (congestive) heart failure: Secondary | ICD-10-CM | POA: Diagnosis not present

## 2024-04-03 DIAGNOSIS — M459 Ankylosing spondylitis of unspecified sites in spine: Secondary | ICD-10-CM | POA: Diagnosis not present

## 2024-04-03 DIAGNOSIS — M25561 Pain in right knee: Secondary | ICD-10-CM | POA: Diagnosis not present

## 2024-04-09 ENCOUNTER — Ambulatory Visit

## 2024-04-09 NOTE — Addendum Note (Signed)
 Addended by: Lott Rouleau A on: 04/09/2024 09:41 AM   Modules accepted: Orders

## 2024-04-09 NOTE — Progress Notes (Signed)
 Remote pacemaker transmission.

## 2024-04-12 NOTE — Progress Notes (Unsigned)
  Electrophysiology Office Note:   ID:  ONTARIO PETTENGILL, DOB [birth year], MRN [id number]  Primary Cardiologist: None Electrophysiologist: Danelle Birmingham, MD  {Click to update primary MD,subspecialty MD or APP then REFRESH:1}    History of Present Illness:   Henry Neal is a 89 y.o. male with h/o CAD, chronic systolic CHF, LBBB, AAA s/p repair seen today for routine electrophysiology followup.   Since last being seen in our clinic the patient reports doing ***.  he denies chest pain, palpitations, dyspnea, PND, orthopnea, nausea, vomiting, dizziness, syncope, edema, weight gain, or early satiety.   Review of systems complete and found to be negative unless listed in HPI.   EP Information / Studies Reviewed:    EKG is not ordered today. EKG from 02/07/2024 reviewed which showed AS-VP at 61 bpm with QRS of 200 bpm but RBBB pattern in Lead V1        PPM Interrogation-  reviewed in detail today,  See PACEART report.  Arrhythmia/Device History HF MD--Dr Rolan  BiV PPM-St. Jude-[place]   Physical Exam:   VS:  There were no vitals taken for this visit.   Wt Readings from Last 3 Encounters:  02/07/24 156 lb 6.4 oz (70.9 kg)  01/22/24 156 lb (70.8 kg)  10/04/23 154 lb 9.6 oz (70.1 kg)     GEN: No acute distress  NECK: No JVD; No carotid bruits CARDIAC: {EPRHYTHM:28826}, no murmurs, rubs, gallops RESPIRATORY:  Clear to auscultation without rales, wheezing or rhonchi  ABDOMEN: Soft, non-tender, non-distended EXTREMITIES:  {EDEMA LEVEL:28147::No} edema; No deformity   ASSESSMENT AND PLAN:    Chronic systolic CHF s/p Abbott BiV PPM  Echo 10/2022 LVEF 45-50% Normal PPM function See Pace Art report No changes today  CAD No s/s of ischemia.      HLD Continue statin   Ankylosing spondylitis Primarily limited from chronic back pain.    {Click here to Review PMH, Prob List, Meds, Allergies, SHx, FHx  :1}   Disposition:   Follow up with {EPPROVIDERS:28135::EP Team}  {EPFOLLOW UP:28173}  Signed, Ozell Prentice Passey, PA-C

## 2024-04-13 ENCOUNTER — Encounter: Payer: Self-pay | Admitting: Student

## 2024-04-13 ENCOUNTER — Ambulatory Visit: Attending: Student | Admitting: Student

## 2024-04-13 VITALS — BP 118/66 | HR 66 | Ht 70.0 in | Wt 152.6 lb

## 2024-04-13 DIAGNOSIS — I509 Heart failure, unspecified: Secondary | ICD-10-CM

## 2024-04-13 DIAGNOSIS — I441 Atrioventricular block, second degree: Secondary | ICD-10-CM | POA: Diagnosis not present

## 2024-04-13 DIAGNOSIS — I2510 Atherosclerotic heart disease of native coronary artery without angina pectoris: Secondary | ICD-10-CM | POA: Diagnosis not present

## 2024-04-13 DIAGNOSIS — I5022 Chronic systolic (congestive) heart failure: Secondary | ICD-10-CM

## 2024-04-13 LAB — CUP PACEART INCLINIC DEVICE CHECK
Battery Remaining Longevity: 16 mo
Battery Voltage: 2.86 V
Brady Statistic RA Percent Paced: 1.4 %
Brady Statistic RV Percent Paced: 99.87 %
Date Time Interrogation Session: 20250623083816
Implantable Lead Connection Status: 753985
Implantable Lead Connection Status: 753985
Implantable Lead Connection Status: 753985
Implantable Lead Implant Date: 20170406
Implantable Lead Implant Date: 20170406
Implantable Lead Implant Date: 20170406
Implantable Lead Location: 753858
Implantable Lead Location: 753859
Implantable Lead Location: 753860
Implantable Pulse Generator Implant Date: 20170406
Lead Channel Impedance Value: 475 Ohm
Lead Channel Impedance Value: 487.5 Ohm
Lead Channel Impedance Value: 812.5 Ohm
Lead Channel Pacing Threshold Amplitude: 0.5 V
Lead Channel Pacing Threshold Amplitude: 0.5 V
Lead Channel Pacing Threshold Amplitude: 1 V
Lead Channel Pacing Threshold Amplitude: 1.375 V
Lead Channel Pacing Threshold Pulse Width: 0.4 ms
Lead Channel Pacing Threshold Pulse Width: 0.4 ms
Lead Channel Pacing Threshold Pulse Width: 0.4 ms
Lead Channel Pacing Threshold Pulse Width: 0.4 ms
Lead Channel Sensing Intrinsic Amplitude: 1.9 mV
Lead Channel Sensing Intrinsic Amplitude: 12 mV
Lead Channel Setting Pacing Amplitude: 2 V
Lead Channel Setting Pacing Amplitude: 2 V
Lead Channel Setting Pacing Amplitude: 2.375
Lead Channel Setting Pacing Pulse Width: 0.4 ms
Lead Channel Setting Pacing Pulse Width: 0.4 ms
Lead Channel Setting Sensing Sensitivity: 2 mV
Pulse Gen Model: 3262
Pulse Gen Serial Number: 7866838

## 2024-04-13 NOTE — Patient Instructions (Signed)
 Medication Instructions:  Your physician recommends that you continue on your current medications as directed. Please refer to the Current Medication list given to you today.  *If you need a refill on your cardiac medications before your next appointment, please call your pharmacy*  Lab Work: BMET-TODAY If you have labs (blood work) drawn today and your tests are completely normal, you will receive your results only by: MyChart Message (if you have MyChart) OR A paper copy in the mail If you have any lab test that is abnormal or we need to change your treatment, we will call you to review the results.  Follow-Up: At [facility], you and your health needs are our priority.  As part of our continuing mission to provide you with exceptional heart care, our providers are all part of one team.  This team includes your primary Cardiologist (physician) and Advanced Practice Providers or APPs (Physician Assistants and Nurse Practitioners) who all work together to provide you with the care you need, when you need it.  Your next appointment:   1 year(s)  Provider:   You will see one of the following Advanced Practice Providers on your designated Care Team:   Charlies Arthur, PA-C Michael Andy Tillery, PA-C Suzann Riddle, NP Daphne Barrack, NP

## 2024-04-14 ENCOUNTER — Ambulatory Visit (HOSPITAL_COMMUNITY)
Admission: RE | Admit: 2024-04-14 | Discharge: 2024-04-14 | Disposition: A | Discharge status: Home / Self Care | Source: Ambulatory Visit | Attending: Internal Medicine | Admitting: Internal Medicine

## 2024-04-14 ENCOUNTER — Ambulatory Visit: Payer: Self-pay | Admitting: Cardiology

## 2024-04-14 DIAGNOSIS — I5022 Chronic systolic (congestive) heart failure: Secondary | ICD-10-CM | POA: Insufficient documentation

## 2024-04-14 LAB — BASIC METABOLIC PANEL WITH GFR
Anion gap: 9 (ref 5–15)
BUN: 9 mg/dL (ref 8–23)
CO2: 24 mmol/L (ref 22–32)
Calcium: 9 mg/dL (ref 8.9–10.3)
Chloride: 102 mmol/L (ref 98–111)
Creatinine, Ser: 0.81 mg/dL (ref 0.61–1.24)
GFR, Estimated: >60 mL/min (ref >60)
Glucose, Bld: 91 mg/dL (ref 70–99)
Potassium: 3.9 mmol/L (ref 3.5–5.1)
Sodium: 135 mmol/L (ref 135–145)

## 2024-04-22 ENCOUNTER — Ambulatory Visit: Admitting: Internal Medicine

## 2024-04-22 ENCOUNTER — Encounter: Payer: Self-pay | Admitting: Internal Medicine

## 2024-04-22 VITALS — BP 124/60 | HR 68 | Ht 66.0 in | Wt 153.0 lb

## 2024-04-22 DIAGNOSIS — I5022 Chronic systolic (congestive) heart failure: Secondary | ICD-10-CM

## 2024-04-22 DIAGNOSIS — I739 Peripheral vascular disease, unspecified: Secondary | ICD-10-CM | POA: Diagnosis not present

## 2024-04-22 DIAGNOSIS — K219 Gastro-esophageal reflux disease without esophagitis: Secondary | ICD-10-CM | POA: Diagnosis not present

## 2024-04-22 DIAGNOSIS — Z7901 Long term (current) use of anticoagulants: Secondary | ICD-10-CM

## 2024-04-22 DIAGNOSIS — R1310 Dysphagia, unspecified: Secondary | ICD-10-CM

## 2024-04-22 DIAGNOSIS — Z860101 Personal history of adenomatous and serrated colon polyps: Secondary | ICD-10-CM

## 2024-04-22 NOTE — Patient Instructions (Addendum)
 A referral was place to ENT they will contact you to schedule a appointment.  [facility] [address] [place], [place] [address] [phone] If your blood pressure at your visit was 140/90 or greater, please contact your primary care physician to follow up on this.  _______________________________________________________  If you are age 88 or older, your body mass index should be between 23-30. Your Body mass index is 24.69 kg/m. If this is out of the aforementioned range listed, please consider follow up with your Primary Care Provider.  If you are age 52 or younger, your body mass index should be between 19-25. Your Body mass index is 24.69 kg/m. If this is out of the aformentioned range listed, please consider follow up with your Primary Care Provider.   ________________________________________________________  The [facility] providers would like to encourage you to use MYCHART to communicate with providers for non-urgent requests or questions.  Due to long hold times on the telephone, sending your provider a message by [facility] may be a faster and more efficient way to get a response.  Please allow 48 business hours for a response.  Please remember that this is for non-urgent requests.  _______________________________________________________  Thank you for entrusting me with your care and for choosing [facility], Dr. Estefana Kidney

## 2024-04-22 NOTE — Progress Notes (Signed)
 04/22/2024 Henry Neal [id number] Aug 05, 1927  ASSESSMENT AND PLAN:  IDA, on plavix  Patent's most recent blood counts and iron levels in 01/2024 showed that his IDA has resolved.  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 His recent modified barium swallow study with speech therapy was normal and did not reveal any oropharyngeal dysphagia. He does have a history of EGD with esophageal dilation years ago.  Patient is still convinced that his dysphagia is related to his sinuses/nasal cavity since food particles sometimes seem to come from his nose. Thus will attempt again to refer him to ENT. I did discuss the risks and benefits of EGD procedure, but patient would like to be seen by ENT first. Patient is still fairly functional at his age so could consider an EGD in the future if he were to be interested.  - Cont once daily pantoprazole  - Referred to ENT again. Once he sees them, he will let me know if he wants to do the EGD procedure again to evaluate his dysphagia  History of adenomatous polyp of colon 05/2009 colonoscopy one adenomatous polyp  PAD (peripheral artery disease) (HCC) On plavix    Chronic systolic CHF (congestive heart failure) S/p AICD  Patient Care Team: Verdia Lombard, MD as PCP - General (Internal Medicine) Waddell Danelle ORN, MD as PCP - Electrophysiology (Cardiology) Croitoru, Jerel, MD as Attending Physician (Cardiology) Rolan Ezra RAMAN, MD as Consulting Physician (Cardiology) Murrell Kuba, MD as Consulting Physician (Orthopedic Surgery) Waddell Danelle ORN, MD as Consulting Physician (Cardiology) Loreda Hacker, DPM as Consulting Physician (Podiatry) Everlean Fallow, MD as Consulting Physician (Rheumatology)  HISTORY OF PRESENT ILLNESS: 89 y.o. male with a past medical history of CAD, PAD, hypertension  dyslipidemia, AAA status post stent 2010, GERD, systolic heart failure echo 11/08/2022 EF 45 to 50% presents for follow up of dysphagia and GERD  Used to see [facility], Dr. Jane.  He has pathology report from colonoscopy 05/15/2004 hyperplastic polyp, and colonoscopy 05/2009 adenomatous polyp, no high grade dysplasia.  He is on plavix  daily for history of CAD   He lives by himself, he drives still, he does his own ADLS.   02/10/2019 CT abdomen pelvis secondary to fall shows small hiatal hernia, diverticulosis, prior aortic stent graft repair of AAA, small bilateral renal cyst Labs reviewed from 07/09/2022 show normocytic anemia Hgb 12.6, MCV 98, thrombocytopenia platelets 144, no leukocytosis.  CT Chest/abdomen/pelvis w/contrast 01/25/23: IMPRESSION: 1. No evidence of acute traumatic injury in the chest, abdomen, or pelvis.  Barium swallow 02/06/23: IMPRESSION: 1.  Mild esophageal dysmotility. 2. Small hiatal hernia. No evidence of gastroesophageal reflux or esophageal stricture.  Speech pathology 02/04/24: Clinical Impression: Patient presents with oropharyngeal swallowing function that is grossly within functional limits, especially considering patient's age and kyphotic habitus. Patient expresses concerns with swallowing large pills, noting globus sensation at level of the upper neck. Across all thin liquid, puree, and solid trials, patient exhibited near complete pharyngeal clearance and no instances of penetration/aspiration. When taking pill with thin liquids, noted penetration above the level of the vocal folds which did not clear with completion of the swallow but did eventually clear with spontaneous subsequent swallows/throat clearing. Pill did remain in the pharynx after first swallow but was quickly moved into the esophagus with reflexive subsequent swallow. Recommend continuation of regular/thin liquid diet at home. SLP encouraged patient to take medications whole  in  puree, especially larger medications as they seem to cause him the most subjective difficulty per report. SLP also provided education re: referred sensation given history of needing esophageal dilation. No further SLP services indicated at this time.   Interval History: He has still had trouble swallowing that has been present for the last year. He didn't end up seeing an ENT because he was never called by their practice. He saw the speech pathologist and he was cleared for a regular diet. He has lost a few lbs. He still feels like things are still getting choked. During his last esophageal dilation, he had improvement in his swallowing but then had worsening of his regurgitation. He still feels like there is an issue with his nasal cavity where foods get hung up. Denies N&V. Denies SOB.   Wt Readings from Last 6 Encounters:  04/22/24 153 lb (69.4 kg)  04/13/24 152 lb 9.6 oz (69.2 kg)  02/07/24 156 lb 6.4 oz (70.9 kg)  01/22/24 156 lb (70.8 kg)  10/04/23 154 lb 9.6 oz (70.1 kg)  09/06/23 156 lb 8 oz (71 kg)    He  reports that he quit smoking about 46 years ago. His smoking use included cigarettes. He started smoking about 76 years ago. He has a 60 pack-year smoking history. He has never been exposed to tobacco smoke. He quit smokeless tobacco use about 39 years ago.  His smokeless tobacco use included chew. He reports current alcohol use of about 7.0 standard drinks of alcohol per week. He reports that he does not use drugs.  RELEVANT LABS AND IMAGING: CBC    Component Value Date/Time   WBC 7.5 02/07/2024 0943   RBC 4.20 (L) 02/07/2024 0943   HGB 14.1 02/07/2024 0943   HCT 41.8 02/07/2024 0943   PLT 146 (L) 02/07/2024 0943   MCV 99.5 02/07/2024 0943   MCH 33.6 02/07/2024 0943   MCHC 33.7 02/07/2024 0943   RDW 13.2 02/07/2024 0943   LYMPHSABS 1.5 03/25/2023 1415   MONOABS 0.5 03/25/2023 1415   EOSABS 0.1 03/25/2023 1415   BASOSABS 0.0 03/25/2023 1415   Recent Labs    10/04/23 0941  01/22/24 0929 02/07/24 0943  HGB 14.1 15.0 14.1     CMP     Component Value Date/Time   NA 135 04/14/2024 1021   K 3.9 04/14/2024 1021   CL 102 04/14/2024 1021   CO2 24 04/14/2024 1021   GLUCOSE 91 04/14/2024 1021   BUN 9 04/14/2024 1021   CREATININE 0.81 04/14/2024 1021   CREATININE 1.11 10/09/2013 1307   CALCIUM  9.0 04/14/2024 1021   PROT 6.3 (L) 02/07/2024 0943   ALBUMIN 3.4 (L) 02/07/2024 0943   AST 23 02/07/2024 0943   ALT 22 02/07/2024 0943   ALKPHOS 72 02/07/2024 0943   BILITOT 0.7 02/07/2024 0943   GFRNONAA >60 04/14/2024 1021   GFRAA 57 (L) 05/30/2020 1144      Latest Ref Rng & Units 02/07/2024    9:43 AM 06/20/2023    3:27 PM 03/25/2023    2:15 PM  Hepatic Function  Total Protein 6.5 - 8.1 g/dL 6.3  5.8  6.6   Albumin 3.5 - 5.0 g/dL 3.4  3.4  4.0   AST 15 - 41 U/L 23  28  23    ALT 0 - 44 U/L 22  22  15    Alk Phosphatase 38 - 126 U/L 72  62  53   Total Bilirubin 0.0 - 1.2 mg/dL 0.7  0.7  0.5       Current Medications:   Current Outpatient Medications (Endocrine & Metabolic):    dapagliflozin  propanediol (FARXIGA ) 10 MG TABS tablet, Take 1 tablet (10 mg total) by mouth daily before breakfast.  Current Outpatient Medications (Cardiovascular):    atorvastatin  (LIPITOR) 40 MG tablet, Take 1 tablet (40 mg total) by mouth daily at 6 PM.   carvedilol  (COREG ) 12.5 MG tablet, Take 1 tablet (12.5 mg total) by mouth 2 (two) times daily with a meal.   furosemide  (LASIX ) 20 MG tablet, Take 1 tablet (20 mg total) by mouth as needed for fluid or edema (for a weight gain of 3-5 in a week.).   losartan  (COZAAR ) 25 MG tablet, Take 1 tablet (25 mg total) by mouth daily.   spironolactone  (ALDACTONE ) 25 MG tablet, Take 1 tablet (25 mg total) by mouth daily.   nitroGLYCERIN  (NITROSTAT ) 0.4 MG SL tablet, PLACE 1 TABLET UNDER TONGUE IF NEEDED EVERY 5 MINUTES FOR CHEST PAIN FOR 3 DOSES. IF NO RELIEF AFTER 1ST DOSE CALL 911 (Patient not taking: Reported on 04/22/2024)  Current  Outpatient Medications (Respiratory):    fluticasone  (FLONASE ) 50 MCG/ACT nasal spray, Place 1 spray into both nostrils daily as needed for allergies.   Current Outpatient Medications (Analgesics):    acetaminophen  (TYLENOL ) 500 MG tablet, Take 1,000 mg by mouth daily as needed for mild pain or headache. Reported on 01/25/2016   meloxicam (MOBIC) 15 MG tablet, Take 15 mg by mouth daily as needed for pain. Pt taking as needed  Current Outpatient Medications (Hematological):    clopidogrel  (PLAVIX ) 75 MG tablet, TAKE 1 TABLET(75 MG) BY MOUTH DAILY   FEROSUL 325 (65 Fe) MG tablet, TAKE 1 TABLET BY MOUTH EVERY DAY WITH BREAKFAST   folic acid  (FOLVITE ) 1 MG tablet, Take 1 mg by mouth daily.  Current Outpatient Medications (Other):    Calcium  Carbonate-Vitamin D (CALCIUM  600 + D PO), Take 1 tablet by mouth 2 (two) times daily.   clotrimazole (LOTRIMIN) 1 % cream, Apply 1 Application topically as needed.   finasteride  (PROSCAR ) 5 MG tablet, take 1 tablet by mouth once daily   gabapentin  (NEURONTIN ) 300 MG capsule, Take 300 mg by mouth 2 (two) times daily.   meclizine  (ANTIVERT ) 25 MG tablet, Take 1 tablet (25 mg total) by mouth 2 (two) times daily as needed for dizziness.   methotrexate (RHEUMATREX) 2.5 MG tablet, Take 17.5 mg by mouth once a week. Caution:Chemotherapy. Protect from light.7 tablets   mometasone  (ELOCON ) 0.1 % cream, Apply 1 application topically daily as needed (for irritation).    Multiple Vitamin (MULTIVITAMIN PO), Take 1 tablet by mouth daily. ALIVE men's vitamin   pantoprazole  (PROTONIX ) 40 MG tablet, Take 40 mg by mouth daily.   potassium chloride  SA (KLOR-CON  M) 20 MEQ tablet, Take 1 tablet (20 mEq total) by mouth daily.   Secukinumab (COSENTYX SENSOREADY PEN) 150 MG/ML SOAJ, Inject 150 mg into the skin every 28 (twenty-eight) days.   sulfaSALAzine (AZULFIDINE) 500 MG tablet, Take 1,500 mg by mouth 2 (two) times daily.  Medical History:  Past Medical History:  Diagnosis Date    AAA (abdominal aortic aneurysm) (HCC)    Abnormal stress test    Allergic rhinitis    uses Flonase  daily   Anginal pain (HCC)    pain in neck,shoulders,down arms occ   Arthritis    Cancer (HCC)    skin   Carotid artery occlusion    Chronic venous insufficiency    Coronary  artery disease    Dizziness    takes Antivert  daily as needed   ED (erectile dysfunction)    GERD (gastroesophageal reflux disease)    takes Omeprazole daily   Hiatal hernia    Hx of echocardiogram 04/04/2010   showed a borderline dilatd left ventricle with mild left ventricle hypertrophy and an EF 50-55% doppler suggested diastolic dysfunction, although the pattern is probably normal for an 89 yrs old. The left atrium was indeed mildly dilated at 42 mm, but there are no significant valvular abnormalities.    Hyperlipidemia    Hypertension    takes Proscar ,Dyazide ,Avapro ,and Metoprolol  daily   Myocardial infarction [facility]) Oct. 11, 2016   Heart Attack   Palpitation    Shortness of breath    Sigmoid diverticulosis    T12 compression fracture (HCC)    Thyroid  disease    Allergies:  Allergies  Allergen Reactions   Doxepin Other (See Comments)    hallucinations  hallucinations Hallucinations  hallucinations Hallucinations hallucinations Hallucinations   Sinequan [Doxepin Hcl] Other (See Comments)    hallucinations   Meloxicam Other (See Comments)   Verapamil  Other (See Comments)    Caused heart to race Caused heart to race  Caused heart to race Caused heart to race   Procaine Itching and Other (See Comments)    Tingling all over as soon as it was injected  Tingling all over as soon as it was injected Tingling all over as soon as it was injected     Surgical History:  He  has a past surgical history that includes Rotator cuff repair (2003); Abdominal aortic aneurysm repair (02/24/2009); Hernia repair (Bilateral); Hand surgery (Right); Endarterectomy (Right, 10/27/2013); melanoma removed; left  heart catheterization with coronary angiogram (N/A, 10/12/2013); Cardiac catheterization (N/A, 08/04/2015); Cardiac catheterization (N/A, 08/04/2015); Cardiac catheterization (N/A, 08/08/2015); and Cardiac catheterization (N/A, 01/26/2016). Family History:  His family history includes COPD in his mother; Diabetes in his mother; Heart disease in his mother; Hyperlipidemia in his mother; Hypertension in his mother.  PHYSICAL EXAM: BP 124/60 (BP Location: Left Arm, Patient Position: Sitting, Cuff Size: Normal)   Pulse 68   Ht 5' 6 (1.676 m)   Wt 153 lb (69.4 kg)   BMI 24.69 kg/m  General Appearance: elderly but well appearing male in no apparent distress. Head:   Normocephalic and atraumatic. Severe cervical kyphosis and limited neck ROM Eyes:  sclerae anicteric,conjunctive pink  Respiratory: Respiratory effort normal, BS equal bilaterally without rales, rhonchi, wheezing. Cardio: RRR with no MRGs. Peripheral pulses intact.  Abdomen: Soft,  Obese ,active bowel sounds. No tenderness Musculoskeletal: Full ROM,  Skin:  Dry and intact without significant lesions or rashes Neuro: Alert and  oriented x4;  No focal deficits. Psych:  Cooperative. Normal mood and affect.    Rosario JAYSON Kidney, MD 8:38 AM  I spent 33 minutes of time, including in depth chart review, independent review of results as outlined above, communicating results with the patient directly, face-to-face time with the patient, coordinating care, and ordering studies and medications as appropriate, and documentation.

## 2024-05-15 ENCOUNTER — Encounter: Payer: Self-pay | Admitting: Podiatry

## 2024-05-15 ENCOUNTER — Ambulatory Visit: Admitting: Podiatry

## 2024-05-15 DIAGNOSIS — M79676 Pain in unspecified toe(s): Secondary | ICD-10-CM

## 2024-05-15 DIAGNOSIS — I739 Peripheral vascular disease, unspecified: Secondary | ICD-10-CM

## 2024-05-15 DIAGNOSIS — B351 Tinea unguium: Secondary | ICD-10-CM

## 2024-05-15 DIAGNOSIS — D689 Coagulation defect, unspecified: Secondary | ICD-10-CM

## 2024-05-15 NOTE — Progress Notes (Signed)
 This patient returns to my office for at risk foot care.  This patient requires this care by a professional since this patient will be at risk due to having renal insufficiency coagulation defect and PAD.   Patient is taking plavix.    This patient is unable to cut nails himself since the patient cannot reach his nails.These nails are painful walking and wearing shoes.  This patient presents for at risk foot care today.  General Appearance  Alert, conversant and in no acute stress.  Vascular  Dorsalis pedis and posterior tibial  pulses are weakly  palpable  bilaterally.  Capillary return is within normal limits  bilaterally. Temperature is within normal limits  bilaterally.  Neurologic  Senn-Weinstein monofilament wire test within normal limits  bilaterally. Muscle power within normal limits bilaterally.  Nails Thick disfigured discolored nails with subungual debris  from hallux to fifth toes bilaterally. No evidence of bacterial infection or drainage bilaterally.  Orthopedic  No limitations of motion  feet .  No crepitus or effusions noted.  No bony pathology or digital deformities noted.  Skin  normotropic skin with no porokeratosis noted bilaterally.  No signs of infections or ulcers noted.     Onychomycosis  Pain in right toes  Pain in left toes  Consent was obtained for treatment procedures.   Mechanical debridement of nails 1-5  bilaterally performed with a nail nipper.  Filed with dremel without incident.    Return office visit   10 weeks                   Told patient to return for periodic foot care and evaluation due to potential at risk complications. Crest pads dispensed.   Helane Gunther DPM

## 2024-06-05 ENCOUNTER — Ambulatory Visit: Payer: Medicare Other

## 2024-06-05 ENCOUNTER — Ambulatory Visit (INDEPENDENT_AMBULATORY_CARE_PROVIDER_SITE_OTHER)

## 2024-06-05 DIAGNOSIS — I441 Atrioventricular block, second degree: Secondary | ICD-10-CM | POA: Diagnosis not present

## 2024-06-08 LAB — CUP PACEART REMOTE DEVICE CHECK
Battery Remaining Longevity: 16 mo
Battery Remaining Percentage: 14 %
Battery Voltage: 2.84 V
Brady Statistic AP VP Percent: 1.4 %
Brady Statistic AP VS Percent: 1 %
Brady Statistic AS VP Percent: 99 %
Brady Statistic AS VS Percent: 1 %
Brady Statistic RA Percent Paced: 1.2 %
Date Time Interrogation Session: 20250815035306
Implantable Lead Connection Status: 753985
Implantable Lead Connection Status: 753985
Implantable Lead Connection Status: 753985
Implantable Lead Implant Date: 20170406
Implantable Lead Implant Date: 20170406
Implantable Lead Implant Date: 20170406
Implantable Lead Location: 753858
Implantable Lead Location: 753859
Implantable Lead Location: 753860
Implantable Pulse Generator Implant Date: 20170406
Lead Channel Impedance Value: 430 Ohm
Lead Channel Impedance Value: 450 Ohm
Lead Channel Impedance Value: 810 Ohm
Lead Channel Pacing Threshold Amplitude: 0.5 V
Lead Channel Pacing Threshold Amplitude: 1 V
Lead Channel Pacing Threshold Amplitude: 1.625 V
Lead Channel Pacing Threshold Pulse Width: 0.4 ms
Lead Channel Pacing Threshold Pulse Width: 0.4 ms
Lead Channel Pacing Threshold Pulse Width: 0.4 ms
Lead Channel Sensing Intrinsic Amplitude: 12 mV
Lead Channel Sensing Intrinsic Amplitude: 2.5 mV
Lead Channel Setting Pacing Amplitude: 2 V
Lead Channel Setting Pacing Amplitude: 2 V
Lead Channel Setting Pacing Amplitude: 2.625
Lead Channel Setting Pacing Pulse Width: 0.4 ms
Lead Channel Setting Pacing Pulse Width: 0.4 ms
Lead Channel Setting Sensing Sensitivity: 2 mV
Pulse Gen Model: 3262
Pulse Gen Serial Number: 7866838

## 2024-06-09 ENCOUNTER — Ambulatory Visit: Payer: Self-pay | Admitting: Internal Medicine

## 2024-06-11 ENCOUNTER — Other Ambulatory Visit (HOSPITAL_COMMUNITY): Payer: Self-pay | Admitting: Cardiology

## 2024-06-11 DIAGNOSIS — I501 Left ventricular failure: Secondary | ICD-10-CM

## 2024-06-16 ENCOUNTER — Other Ambulatory Visit (HOSPITAL_COMMUNITY): Payer: Self-pay | Admitting: Cardiology

## 2024-06-24 DIAGNOSIS — M79645 Pain in left finger(s): Secondary | ICD-10-CM | POA: Diagnosis not present

## 2024-06-24 DIAGNOSIS — T148XXA Other injury of unspecified body region, initial encounter: Secondary | ICD-10-CM | POA: Diagnosis not present

## 2024-06-24 DIAGNOSIS — Z9181 History of falling: Secondary | ICD-10-CM | POA: Diagnosis not present

## 2024-06-24 DIAGNOSIS — M25512 Pain in left shoulder: Secondary | ICD-10-CM | POA: Diagnosis not present

## 2024-06-26 DIAGNOSIS — Z23 Encounter for immunization: Secondary | ICD-10-CM | POA: Diagnosis not present

## 2024-07-02 ENCOUNTER — Telehealth (HOSPITAL_COMMUNITY): Payer: Self-pay | Admitting: Cardiology

## 2024-07-02 NOTE — Telephone Encounter (Signed)
 Called to confirm/remind patient of their appointment at the [facility] on 07/02/24.   Appointment:   [] Confirmed  [x] Left mess   [] No answer/No voice mail  [] VM Full/unable to leave message  [] Phone not in service  Patient reminded to bring all medications and/or complete list.  Confirmed patient has transportation. Gave directions, instructed to utilize valet parking.

## 2024-07-03 ENCOUNTER — Encounter (HOSPITAL_COMMUNITY): Payer: Self-pay | Admitting: Cardiology

## 2024-07-03 ENCOUNTER — Ambulatory Visit (HOSPITAL_COMMUNITY): Payer: Self-pay | Admitting: Cardiology

## 2024-07-03 ENCOUNTER — Ambulatory Visit (HOSPITAL_COMMUNITY)
Admission: RE | Admit: 2024-07-03 | Discharge: 2024-07-03 | Disposition: A | Discharge status: Home / Self Care | Source: Ambulatory Visit | Attending: Cardiology | Admitting: Cardiology

## 2024-07-03 VITALS — BP 108/60 | HR 61 | Wt 149.6 lb

## 2024-07-03 DIAGNOSIS — Z955 Presence of coronary angioplasty implant and graft: Secondary | ICD-10-CM | POA: Insufficient documentation

## 2024-07-03 DIAGNOSIS — Z8249 Family history of ischemic heart disease and other diseases of the circulatory system: Secondary | ICD-10-CM | POA: Insufficient documentation

## 2024-07-03 DIAGNOSIS — Z7902 Long term (current) use of antithrombotics/antiplatelets: Secondary | ICD-10-CM | POA: Diagnosis not present

## 2024-07-03 DIAGNOSIS — I2510 Atherosclerotic heart disease of native coronary artery without angina pectoris: Secondary | ICD-10-CM | POA: Insufficient documentation

## 2024-07-03 DIAGNOSIS — R9431 Abnormal electrocardiogram [ECG] [EKG]: Secondary | ICD-10-CM | POA: Diagnosis not present

## 2024-07-03 DIAGNOSIS — I6529 Occlusion and stenosis of unspecified carotid artery: Secondary | ICD-10-CM | POA: Insufficient documentation

## 2024-07-03 DIAGNOSIS — M25569 Pain in unspecified knee: Secondary | ICD-10-CM | POA: Diagnosis not present

## 2024-07-03 DIAGNOSIS — M25579 Pain in unspecified ankle and joints of unspecified foot: Secondary | ICD-10-CM | POA: Insufficient documentation

## 2024-07-03 DIAGNOSIS — Z79899 Other long term (current) drug therapy: Secondary | ICD-10-CM | POA: Insufficient documentation

## 2024-07-03 DIAGNOSIS — I447 Left bundle-branch block, unspecified: Secondary | ICD-10-CM | POA: Diagnosis not present

## 2024-07-03 DIAGNOSIS — G8929 Other chronic pain: Secondary | ICD-10-CM | POA: Insufficient documentation

## 2024-07-03 DIAGNOSIS — M459 Ankylosing spondylitis of unspecified sites in spine: Secondary | ICD-10-CM | POA: Insufficient documentation

## 2024-07-03 DIAGNOSIS — I252 Old myocardial infarction: Secondary | ICD-10-CM | POA: Insufficient documentation

## 2024-07-03 DIAGNOSIS — I5022 Chronic systolic (congestive) heart failure: Secondary | ICD-10-CM | POA: Diagnosis not present

## 2024-07-03 DIAGNOSIS — Z950 Presence of cardiac pacemaker: Secondary | ICD-10-CM | POA: Diagnosis not present

## 2024-07-03 DIAGNOSIS — I255 Ischemic cardiomyopathy: Secondary | ICD-10-CM | POA: Diagnosis not present

## 2024-07-03 DIAGNOSIS — Z8679 Personal history of other diseases of the circulatory system: Secondary | ICD-10-CM | POA: Diagnosis not present

## 2024-07-03 LAB — BASIC METABOLIC PANEL WITH GFR
Anion gap: 10 (ref 5–15)
BUN: 12 mg/dL (ref 8–23)
CO2: 25 mmol/L (ref 22–32)
Calcium: 8.9 mg/dL (ref 8.9–10.3)
Chloride: 99 mmol/L (ref 98–111)
Creatinine, Ser: 0.82 mg/dL (ref 0.61–1.24)
GFR, Estimated: >60 mL/min (ref >60)
Glucose, Bld: 102 mg/dL — ABNORMAL HIGH (ref 70–99)
Potassium: 3.6 mmol/L (ref 3.5–5.1)
Sodium: 134 mmol/L — ABNORMAL LOW (ref 135–145)

## 2024-07-03 LAB — BRAIN NATRIURETIC PEPTIDE: B Natriuretic Peptide: 393.8 pg/mL — ABNORMAL HIGH (ref 0.0–100.0)

## 2024-07-03 NOTE — Patient Instructions (Signed)
 There has been no changes to your medications.  Labs done today, your results will be available in MyChart, we will contact you for abnormal readings.  Your physician recommends that you schedule a follow-up appointment in: 6 months.  If you have any questions or concerns before your next appointment please send [place] a message through [place] or call our office at [phone].    TO LEAVE A MESSAGE FOR THE NURSE SELECT OPTION 2, PLEASE LEAVE A MESSAGE INCLUDING: YOUR NAME DATE OF BIRTH CALL BACK NUMBER REASON FOR CALL**this is important as we prioritize the call backs  YOU WILL RECEIVE A CALL BACK THE SAME DAY AS LONG AS YOU CALL BEFORE 4:00 PM  At the [facility], you and your health needs are our priority. As part of our continuing mission to provide you with exceptional heart care, we have created designated Provider Care Teams. These Care Teams include your primary Cardiologist (physician) and Advanced Practice Providers (APPs- Physician Assistants and Nurse Practitioners) who all work together to provide you with the care you need, when you need it.   You may see any of the following providers on your designated Care Team at your next follow up: Dr Arvilla Meres Dr Marca Ancona Dr. Dorthula Nettles Dr. Clearnce Hasten Amy Filbert Schilder, NP Robbie Lis, [place] [facility] [place], [place] Brynda Peon, NP [place] Lee, NP Clarisa Kindred, NP Karle Plumber, PharmD Enos Fling, PharmD   Please be sure to bring in all your medications bottles to every appointment.    Thank you for choosing [facility]

## 2024-07-04 NOTE — Progress Notes (Signed)
 Patient ID: Henry Neal, male   DOB: [birth year], 89 y.o.   MRN: [id number]     Advanced Heart Failure Clinic Note  PCP: Dr. Verdia Cardiology: Dr. Rolan  Chief complaint: CHF  Henry Neal is a 89 y.o. male with history of CAD, chronic systolic CHF, LBBB, AAA s/p repair presents for followup of CHF and CAD.  He had a cardiac cath in 12/15, showing total occlusion of the LAD and 50-60% proximal RCA.  EF at that time was near-normal.  In 10/16, he developed dyspnea and epigastric pain.  He was noted to be in acute pulmonary edema and was diuresed.  Troponin was only mildly elevated at 0.47.  He ultimately underwent LHC showing hazy 80-90% RCA stenosis which was likely the culprit for his presentation.  Additionally, the LAD was only subtotally occluded on this cath.  He had DES to RCA.  Cardiac MRI was done, showing EF 22% with significant viability in LAD territory.  Therefore, CTO of LAD was opened with DES.  Echo in 2/17 showed EF persistently low at 15%.  He had St Jude CRT-P placement in 4/17. Echo in 1/19 showed EF up to 45-50% with mild MR.    Echo 3/20 showed EF 45% with mild diffuse hypokinesis, mildly decreased RV systolic function. Echo in 3/21 showed EF 45-50%, mild RV dilation with normal systolic function. Echo in 10/22 showed EF 50-55%, normal RV, mild MR.    He was diagnosed with ankylosing spondylitis and is now on methotrexate.    Echo 1/24 EF 45-50%, mild RV dysfunction.   He returns for followup of CHF.  He feels his main limitation is his chronic LBP from Ankylosing spondylitis. He does not take his methotrexate regularly as it aggravates his stomach. Having some knee and ankle pain as well.  Still working part-time.  Wears compression stockings.  Weight down 6 lbs. No exertional dyspnea or chest pain.  Not using Lasix .  No orthopnea/PND.   St Jude device interrogation: >99 % BiV pacing, stable thoracic impedance.    ECG (personally reviewed): NSR, BiV pacing  Labs  (4/24): K 3.9, SCr 0.80, tSat 16, ferritin 7.1 Labs (5/24): K 3.9, SCr .96, LDL 41 Labs (6/24): K 4, creatinine 0.89  Labs (8/24): BNP 80, K 3.4, creatinine 0.83 Labs (12/24): K 3.4, SCr 0.90  Labs (4/25): LDL 43 Labs (6/25): K 3.9, creatinine 0.81  PMH: 1. CKD 2. AAA s/p repair, followed at [facility]. 3. Carotid stenosis: s/p right CEA.   Carotid dopplers (2/16) with right CEA stable, < 40% LICA stenosis.  Followed at [facility].  - Carotid dopplers (3/18): 1-39% LICA, patent right CEA.  4. Chronic LBBB 5. LAD: LHC (12/15) with totally occluded LAD, 50-60% pRCA => medically managed.  10/16 admitted with NSTEMI.  LHC with subtotalled LAD occlusion, 80-90% pRCA with thrombus => DES RCA initially.  Cardiac MRI was done showing viability in the LAD territory, so he had DES to the CTO LAD.   6. Chronic systolic CHF: Ischemic cardiomyopathy.  Echo (10/16) with EF 15% with wall motion abnormalities, moderately dilated RV with normal systolic function.  CMRI (10/16) with severe LV dilation, EF 22% with septal-lateral dyssynchrony, no LV thrombus, normal RV size/systolic function => significant viability noted in the LAD territory.  Echo (2/17): EF 15%, severe LV dilation, wall motion abnormalities noted, mild MR, mildly dilated RV with normal systolic function, PASP 55 mmHg.  St Jude CRT-P 4/17.  - Echo (1/18): EF 50-55%, mild to moderate  MR.  - Echo (1/19): EF 45-50%, mild LVH, inferior hypokinesis, mild MR, PASP 30 mmHg.  - Echo (3/20): EF 45%, diffuse hypokinesis, normal RV size with mildly decreased systolic function.  - Echo (3/21): EF 45-50%, mildly dilated RV with normal systolic function.  - Echo (10/22): EF 50-55%, normal RV, mild MR - Echo (1/24): EF 45-50%, mild RV dysfunction 7. ABIs (10/16) were normal.  8. Raynaud's syndrome.  9. Sciatica  SH: Lives alone, prior smoker, still working as a Chartered certified accountant.  FH: CAD  Review of systems complete and found to be negative unless listed in HPI.     Current Outpatient Medications  Medication Sig Dispense Refill   acetaminophen  (TYLENOL ) 500 MG tablet Take 1,000 mg by mouth daily as needed for mild pain or headache. Reported on 01/25/2016     atorvastatin  (LIPITOR) 40 MG tablet Take 1 tablet (40 mg total) by mouth daily at 6 PM. 30 tablet 6   Calcium  Carbonate-Vitamin D (CALCIUM  600 + D PO) Take 1 tablet by mouth 2 (two) times daily.     carvedilol  (COREG ) 12.5 MG tablet Take 1 tablet (12.5 mg total) by mouth 2 (two) times daily with a meal. 180 tablet 3   clopidogrel  (PLAVIX ) 75 MG tablet TAKE 1 TABLET(75 MG) BY MOUTH DAILY 90 tablet 1   clotrimazole (LOTRIMIN) 1 % cream Apply 1 Application topically as needed.     FARXIGA  10 MG TABS tablet TAKE 1 TABLET(10 MG) BY MOUTH DAILY BEFORE BREAKFAST 90 tablet 3   FEROSUL 325 (65 Fe) MG tablet TAKE 1 TABLET BY MOUTH EVERY DAY WITH BREAKFAST 30 tablet 3   finasteride  (PROSCAR ) 5 MG tablet take 1 tablet by mouth once daily     fluticasone  (FLONASE ) 50 MCG/ACT nasal spray Place 1 spray into both nostrils daily as needed for allergies.      folic acid  (FOLVITE ) 1 MG tablet Take 1 mg by mouth daily.     furosemide  (LASIX ) 20 MG tablet Take 1 tablet (20 mg total) by mouth as needed for fluid or edema (for a weight gain of 3-5 in a week.). 15 tablet 6   gabapentin  (NEURONTIN ) 300 MG capsule Take 300 mg by mouth 2 (two) times daily.  0   losartan  (COZAAR ) 25 MG tablet Take 1 tablet (25 mg total) by mouth daily. 90 tablet 3   meclizine  (ANTIVERT ) 25 MG tablet Take 1 tablet (25 mg total) by mouth 2 (two) times daily as needed for dizziness. 30 tablet 0   meloxicam (MOBIC) 15 MG tablet Take 15 mg by mouth daily as needed for pain. Pt taking as needed  0   methotrexate (RHEUMATREX) 2.5 MG tablet Take 17.5 mg by mouth once a week. Caution:Chemotherapy. Protect from light.7 tablets     mometasone  (ELOCON ) 0.1 % cream Apply 1 application topically daily as needed (for irritation).      Multiple Vitamin  (MULTIVITAMIN PO) Take 1 tablet by mouth daily. ALIVE men's vitamin     nitroGLYCERIN  (NITROSTAT ) 0.4 MG SL tablet PLACE 1 TABLET UNDER TONGUE IF NEEDED EVERY 5 MINUTES FOR CHEST PAIN FOR 3 DOSES. IF NO RELIEF AFTER 1ST DOSE CALL 911 25 tablet 3   pantoprazole  (PROTONIX ) 40 MG tablet Take 40 mg by mouth daily.     potassium chloride  SA (KLOR-CON  M) 20 MEQ tablet Take 1 tablet (20 mEq total) by mouth daily. 90 tablet 3   Secukinumab (COSENTYX SENSOREADY PEN) 150 MG/ML SOAJ Inject 150 mg into the skin every 28 (  twenty-eight) days.     spironolactone  (ALDACTONE ) 25 MG tablet Take 1 tablet (25 mg total) by mouth daily. 90 tablet 3   sulfaSALAzine (AZULFIDINE) 500 MG tablet Take 1,500 mg by mouth 2 (two) times daily.     No current facility-administered medications for this encounter.   BP 108/60   Pulse 61   Wt 67.9 kg (149 lb 9.6 oz)   SpO2 96%   BMI 24.15 kg/m   Wt Readings from Last 3 Encounters:  07/03/24 67.9 kg (149 lb 9.6 oz)  04/22/24 69.4 kg (153 lb)  04/13/24 69.2 kg (152 lb 9.6 oz)    PHYSICAL EXAM: General: NAD Neck: No JVD, no thyromegaly or thyroid  nodule.  Lungs: Clear to auscultation bilaterally with normal respiratory effort. CV: Nondisplaced PMI.  Heart regular S1/S2, no S3/S4, no murmur.  1+ ankle edema.  No carotid bruit.  Normal pedal pulses.  Abdomen: Soft, nontender, no hepatosplenomegaly, no distention.  Skin: Intact without lesions or rashes.  Neurologic: Alert and oriented x 3.  Psych: Normal affect. Extremities: No clubbing or cyanosis.  HEENT: Normal.   Assessment/Plan: 1. Chronic HF with mid range EF: Ischemic cardiomyopathy + LBBB. EF previously as low as 15%. Improved since getting Medtronic CRT-P.  Echo 10/22 showed EF 50-55%.  Echo 1/24 EF 45-50%. Stable NYHA class II symptoms, more limited by orthopedic issues.  Not volume overloaded by exam or Corvue, BiV pacing appropriately.  - Continue Farxiga  10 mg daily.  - Continue Coreg  12.5 mg bid  -  Continue spironolactone  25 mg daily.  BMET/BNP today.  - Continue losartan  25 mg daily. He did not tolerate Entresto  in the past (low BP and dizziness) - I will arrange for echo.  2. CAD: NSTEMI with DES to RCA (culprit vessel) and subsequent DES to CTO of LAD (cMRI showed viability in the LAD distribution) in 10/16.  No chest pain.  - Continue Plavix  75 mg daily.  - On Atorvastatin  40 daily. Good lipids in 4/25.  3. Carotid stenosis: s/p Rt CEA by Dr. Harvey in 2018. Minimal carotid stenosis on 9/22 dopplers.   - continue Plavix  and statin - followed by [facility]  4. Ankylosing spondylitis: Chronic back pain. Followed by Rheumatology, he admits that he is not entirely compliant w/ his MTX, as it aggravates his stomach.   Followup 6 months with APP  I spent 32 minutes reviewing data, interviewing patient and family, and organizing the orders/followup.   Ezra Shuck, MD  07/04/2024

## 2024-07-09 ENCOUNTER — Ambulatory Visit

## 2024-07-10 DIAGNOSIS — I5022 Chronic systolic (congestive) heart failure: Secondary | ICD-10-CM | POA: Diagnosis not present

## 2024-07-10 DIAGNOSIS — M8580 Other specified disorders of bone density and structure, unspecified site: Secondary | ICD-10-CM | POA: Diagnosis not present

## 2024-07-10 DIAGNOSIS — M459 Ankylosing spondylitis of unspecified sites in spine: Secondary | ICD-10-CM | POA: Diagnosis not present

## 2024-07-10 DIAGNOSIS — M25561 Pain in right knee: Secondary | ICD-10-CM | POA: Diagnosis not present

## 2024-07-10 DIAGNOSIS — Z79899 Other long term (current) drug therapy: Secondary | ICD-10-CM | POA: Diagnosis not present

## 2024-07-14 ENCOUNTER — Ambulatory Visit (INDEPENDENT_AMBULATORY_CARE_PROVIDER_SITE_OTHER): Admitting: Otolaryngology

## 2024-07-14 ENCOUNTER — Encounter (INDEPENDENT_AMBULATORY_CARE_PROVIDER_SITE_OTHER): Payer: Self-pay | Admitting: Otolaryngology

## 2024-07-14 VITALS — BP 130/61 | HR 68 | Temp 98.9°F

## 2024-07-14 DIAGNOSIS — J383 Other diseases of vocal cords: Secondary | ICD-10-CM

## 2024-07-14 DIAGNOSIS — R490 Dysphonia: Secondary | ICD-10-CM

## 2024-07-14 DIAGNOSIS — K219 Gastro-esophageal reflux disease without esophagitis: Secondary | ICD-10-CM | POA: Diagnosis not present

## 2024-07-14 DIAGNOSIS — R0981 Nasal congestion: Secondary | ICD-10-CM

## 2024-07-14 DIAGNOSIS — Z87891 Personal history of nicotine dependence: Secondary | ICD-10-CM | POA: Diagnosis not present

## 2024-07-14 DIAGNOSIS — J3489 Other specified disorders of nose and nasal sinuses: Secondary | ICD-10-CM

## 2024-07-14 DIAGNOSIS — R1310 Dysphagia, unspecified: Secondary | ICD-10-CM | POA: Diagnosis not present

## 2024-07-14 DIAGNOSIS — R251 Tremor, unspecified: Secondary | ICD-10-CM

## 2024-07-14 MED ORDER — SALINE SPRAY 0.65 % NA SOLN: 1.0000 | NASAL | 5 refills | Status: AC | PRN

## 2024-07-14 NOTE — Progress Notes (Signed)
 ENT CONSULT:  Reason for Consult: dysphagia    HPI: Discussed the use of AI scribe software for clinical note transcription with the patient, who gave verbal consent to proceed.  History of Present Illness Henry Neal is a 89 year old male who presents with difficulty swallowing and sore throat.  He experiences difficulty swallowing both pills and solid foods, though liquids are less problematic. He describes a sensation of swallowing 'just air' and occasionally feels a cold sensation in his throat. No history of aspiration pneumonia is noted.  He has a sore throat that often occurs upon waking and is not related to his tonsils. Voice changes have been observed, and his throat becomes sore with extensive talking.  Food sometimes regurgitates into his nose, and he occasionally expels food from his nose. He has a history of nasal obstruction due to multiple nasal fractures, resulting in a narrowed right side. He finds it difficult to blow out his right nostril, which often feels congested.  Previous evaluations revealed a small hernia and evidence of reflux in his esophagus. A swallow study showed no aspiration. No history of pneumonia.     Records Reviewed:  GI office visit 04/22/24 Esophageal dysphagia with GERD, remote history of dilatation per patient History of AKS on methotrexate and severe cervical kyphosis  Patient continues to have solid food/pill dysphagia. His recent modified barium swallow study with speech therapy was normal and did not reveal any oropharyngeal dysphagia. He does have a history of EGD with esophageal dilation years ago.  Patient is still convinced that his dysphagia is related to his sinuses/nasal cavity since food particles sometimes seem to come from his nose. Thus will attempt again to refer him to ENT. I did discuss the risks and benefits of EGD procedure, but patient would like to be seen by ENT first. Patient is still fairly functional at his age so could  consider an EGD in the future if he were to be interested.  - Cont once daily pantoprazole  - Referred to ENT again. Once he sees them, he will let me know if he wants to do the EGD procedure again to evaluate his dysphagia  PAD (peripheral artery disease) (HCC) On Plavix     Past Medical History:  Diagnosis Date   AAA (abdominal aortic aneurysm)    Abnormal stress test    Allergic rhinitis    uses Flonase  daily   Anginal pain    pain in neck,shoulders,down arms occ   Arthritis    Cancer (HCC)    skin   Carotid artery occlusion    Chronic venous insufficiency    Coronary artery disease    Dizziness    takes Antivert  daily as needed   ED (erectile dysfunction)    GERD (gastroesophageal reflux disease)    takes Omeprazole daily   Hiatal hernia    Hx of echocardiogram 04/04/2010   showed a borderline dilatd left ventricle with mild left ventricle hypertrophy and an EF 50-55% doppler suggested diastolic dysfunction, although the pattern is probably normal for an 89 yrs old. The left atrium was indeed mildly dilated at 42 mm, but there are no significant valvular abnormalities.    Hyperlipidemia    Hypertension    takes Proscar ,Dyazide ,Avapro ,and Metoprolol  daily   Myocardial infarction [facility]) Oct. 11, 2016   Heart Attack   Palpitation    Shortness of breath    Sigmoid diverticulosis    T12 compression fracture (HCC)    Thyroid  disease     Past  Surgical History:  Procedure Laterality Date   ABDOMINAL AORTIC ANEURYSM REPAIR  02/24/2009   AAA Stenting   CARDIAC CATHETERIZATION N/A 08/04/2015   Procedure: Right/Left Heart Cath and Coronary Angiography;  Surgeon: Ezra GORMAN Shuck, MD;  Location: [facility] INVASIVE CV LAB;  Service: Cardiovascular;  Laterality: N/A;   CARDIAC CATHETERIZATION N/A 08/04/2015   Procedure: Coronary Stent Intervention;  Surgeon: Lonni JONETTA Cash, MD;  Location: [facility] INVASIVE CV LAB;  Service: Cardiovascular;  Laterality: N/A;   CARDIAC CATHETERIZATION N/A  08/08/2015   Procedure: Coronary Stent Intervention;  Surgeon: Peter M Swaziland, MD;  Location: [facility] INVASIVE CV LAB;  Service: Cardiovascular;  Laterality: N/A;   ENDARTERECTOMY Right 10/27/2013   Procedure: ENDARTERECTOMY CAROTID;  Surgeon: Carlin FORBES Haddock, MD;  Location: [facility] OR;  Service: Vascular;  Laterality: Right;   EP IMPLANTABLE DEVICE N/A 01/26/2016   Procedure: BiV Pacemaker Insertion CRT-P;  Surgeon: Danelle LELON Birmingham, MD;  Location: [facility] INVASIVE CV LAB;  Service: Cardiovascular;  Laterality: N/A;   HAND SURGERY Right    tendon, lft 90's   HERNIA REPAIR Bilateral    LEFT HEART CATHETERIZATION WITH CORONARY ANGIOGRAM N/A 10/12/2013   Procedure: LEFT HEART CATHETERIZATION WITH CORONARY ANGIOGRAM;  Surgeon: Dorn JINNY Lesches, MD;  Location[facility] C[facility] LAB;  Service: Cardiovascular;  Laterality: N/A;   melanoma removed     ROTATOR CUFF REPAIR  2003   left arm    Family History  Problem Relation Age of Onset   Heart disease Mother        before age 74   COPD Mother    Diabetes Mother    Hyperlipidemia Mother    Hypertension Mother     Social History:  reports that he quit smoking about 46 years ago. His smoking use included cigarettes. He started smoking about 76 years ago. He has a 60 pack-year smoking history. He has never been exposed to tobacco smoke. He quit smokeless tobacco use about 39 years ago.  His smokeless tobacco use included chew. He reports current alcohol use of about 7.0 standard drinks of alcohol per week. He reports that he does not use drugs.  Allergies:  Allergies  Allergen Reactions   Doxepin Other (See Comments)    hallucinations  hallucinations Hallucinations  hallucinations Hallucinations hallucinations Hallucinations   Sinequan [Doxepin Hcl] Other (See Comments)    hallucinations   Meloxicam Other (See Comments)   Verapamil  Other (See Comments)    Caused heart to race Caused heart to race  Caused heart to race Caused heart to race   Procaine Itching  and Other (See Comments)    Tingling all over as soon as it was injected  Tingling all over as soon as it was injected Tingling all over as soon as it was injected    Medications: I have reviewed the patient's current medications.  The PMH, PSH, Medications, Allergies, and SH were reviewed and updated.  ROS: Constitutional: Negative for fever, weight loss and weight gain. Cardiovascular: Negative for chest pain and dyspnea on exertion. Respiratory: Is not experiencing shortness of breath at rest. Gastrointestinal: Negative for nausea and vomiting. Neurological: Negative for headaches. Psychiatric: The patient is not nervous/anxious  Blood pressure 130/61, pulse 68, temperature 98.9 F (37.2 C), SpO2 94%. There is no height or weight on file to calculate BMI.  PHYSICAL EXAM:  Exam: General: Well-developed, well-nourished Communication and Voice: tremulous Respiratory Respiratory effort: Equal inspiration and expiration without stridor Cardiovascular Peripheral Vascular: Warm extremities with equal color/perfusion Eyes: No nystagmus with  equal extraocular motion bilaterally Neuro/Psych/Balance: Patient oriented to person, place, and time; Appropriate mood and affect; Gait is intact with no imbalance; Cranial nerves I-XII are intact Head and Face Inspection: Normocephalic and atraumatic without mass or lesion Palpation: Facial skeleton intact without bony stepoffs Salivary Glands: No mass or tenderness Facial Strength: Facial motility symmetric and full bilaterally ENT Pinna: External ear intact and fully developed External canal: Canal is patent with intact skin Tympanic Membrane: Clear and mobile External Nose: No scar or anatomic deformity Internal Nose: Septum is straight. No polyp, or purulence. Mucosal edema and erythema present.  Bilateral inferior turbinate hypertrophy.  Lips, Teeth, and gums: Mucosa and teeth intact and viable TMJ: No pain to palpation with full  mobility Oral cavity/oropharynx: No erythema or exudate, no lesions present Nasopharynx: No mass or lesion with intact mucosa Hypopharynx: Intact mucosa without pooling of secretions Larynx Glottic: Full true vocal cord mobility without lesion or mass VF atrophy  Supraglottic: Normal appearing epiglottis and AE folds Interarytenoid Space: Moderate pachydermia&edema Subglottic Space: Patent without lesion or edema Neck Neck and Trachea: Midline trachea without mass or lesion Thyroid : No mass or nodularity Lymphatics: No lymphadenopathy  Procedure: Preoperative diagnosis: dysphagia   Postoperative diagnosis:   Same + VF atrophy GERD LPR  Procedure: Flexible fiberoptic laryngoscopy  Surgeon: Elena Larry, MD  Anesthesia: Topical lidocaine  and Afrin Complications: None Condition is stable throughout exam  Indications and consent:  The patient presents to the clinic with above symptoms. Indirect laryngoscopy view was incomplete. Thus it was recommended that they undergo a flexible fiberoptic laryngoscopy. All of the risks, benefits, and potential complications were reviewed with the patient preoperatively and verbal informed consent was obtained.  Procedure: The patient was seated upright in the clinic. Topical lidocaine  and Afrin were applied to the nasal cavity. After adequate anesthesia had occurred, I then proceeded to pass the flexible telescope into the nasal cavity. The nasal cavity was patent without rhinorrhea or polyp. The nasopharynx was also patent without mass or lesion. The base of tongue was visualized and was normal. There were no signs of pooling of secretions in the piriform sinuses. The true vocal folds were mobile bilaterally. There were no signs of glottic or supraglottic mucosal lesion or mass. There was moderate interarytenoid pachydermia and post cricoid edema. The telescope was then slowly withdrawn and the patient tolerated the procedure throughout.   Studies  Reviewed: MBS 02/04/24 IMPRESSION: Vestibular penetration with thin liquids through straw.  Esophagram 02/06/24 IMPRESSION: 1.  Mild esophageal dysmotility.   2. Small hiatal hernia. No evidence of gastroesophageal reflux or esophageal stricture.   Assessment/Plan: Encounter Diagnoses  Name Primary?   Dysphagia, unspecified type Yes   Dysphonia    Hoarseness    Age-related vocal fold atrophy    Glottic insufficiency    Chronic GERD     Assessment and Plan Assessment & Plan Dysphagia (solids predominant) Dysphagia primarily with solids, no aspiration on swallow study, with evidence of hiatal hernia and GERD on esophagram. He had some penetration. Possible esophageal dilation discussed with GI per notes and record review. - Refer to speech therapist for swallow therapy.  Sore throat chronic throat discomfort Sore throat likely due to vocal cord atrophy and strain when talking (seen on scope exam) vs GERD.  - Refer to speech therapist for swallow therapy.  Laryngeal tremor  Evidence of tremulous voice on exam and palate laryngeal tremor during scope exam only with phonation.  - he is not bothered by quality of his  voice, will monitor for now  Laryngopharyngeal reflux Reflux changes observed on scope exam, contributing to sore throat, especially upon waking. - diet and lifestyle changes  Nasal congestion and R septal deviation Nasal obstruction due to right septal deviation. Nasal trauma with multiple fractures in the past.  - Prescribed nasal saline spray. - surgical options might be too risky due to advanced age        Thank you for allowing me to participate in the care of this patient. Please do not hesitate to contact me with any questions or concerns.   Elena Larry, MD Otolaryngology [facility] Phone: [phone] Fax: [phone]    07/14/2024, 2:53 PM

## 2024-07-15 NOTE — Progress Notes (Signed)
 Remote PPM Transmission

## 2024-07-17 ENCOUNTER — Ambulatory Visit (HOSPITAL_COMMUNITY)
Admission: RE | Admit: 2024-07-17 | Discharge: 2024-07-17 | Disposition: A | Discharge status: Home / Self Care | Source: Ambulatory Visit | Attending: Cardiology | Admitting: Cardiology

## 2024-07-17 DIAGNOSIS — I071 Rheumatic tricuspid insufficiency: Secondary | ICD-10-CM | POA: Diagnosis not present

## 2024-07-17 DIAGNOSIS — I5022 Chronic systolic (congestive) heart failure: Secondary | ICD-10-CM | POA: Insufficient documentation

## 2024-07-17 DIAGNOSIS — I110 Hypertensive heart disease with heart failure: Secondary | ICD-10-CM | POA: Diagnosis not present

## 2024-07-17 DIAGNOSIS — I2510 Atherosclerotic heart disease of native coronary artery without angina pectoris: Secondary | ICD-10-CM | POA: Insufficient documentation

## 2024-07-17 LAB — ECHOCARDIOGRAM COMPLETE
AR max vel: 2.62 cm2
AV Area VTI: 2.5 cm2
AV Area mean vel: 2.85 cm2
AV Mean grad: 3 mmHg
AV Peak grad: 6.9 mmHg
Ao pk vel: 1.31 m/s
Area-P 1/2: 2.83 cm2
Calc EF: 62.7 %
S' Lateral: 3.69 cm
Single Plane A2C EF: 59.7 %
Single Plane A4C EF: 65.6 %

## 2024-07-24 ENCOUNTER — Encounter: Payer: Self-pay | Admitting: Podiatry

## 2024-07-24 ENCOUNTER — Ambulatory Visit: Admitting: Podiatry

## 2024-07-24 DIAGNOSIS — I739 Peripheral vascular disease, unspecified: Secondary | ICD-10-CM

## 2024-07-24 DIAGNOSIS — B351 Tinea unguium: Secondary | ICD-10-CM

## 2024-07-24 DIAGNOSIS — M79676 Pain in unspecified toe(s): Secondary | ICD-10-CM | POA: Diagnosis not present

## 2024-07-24 NOTE — Progress Notes (Signed)
 This patient returns to my office for at risk foot care.  This patient requires this care by a professional since this patient will be at risk due to having renal insufficiency coagulation defect and PAD.   Patient is taking plavix.    This patient is unable to cut nails himself since the patient cannot reach his nails.These nails are painful walking and wearing shoes.  This patient presents for at risk foot care today.  General Appearance  Alert, conversant and in no acute stress.  Vascular  Dorsalis pedis and posterior tibial  pulses are weakly  palpable  bilaterally.  Capillary return is within normal limits  bilaterally. Temperature is within normal limits  bilaterally.  Neurologic  Senn-Weinstein monofilament wire test within normal limits  bilaterally. Muscle power within normal limits bilaterally.  Nails Thick disfigured discolored nails with subungual debris  from hallux to fifth toes bilaterally. No evidence of bacterial infection or drainage bilaterally.  Orthopedic  No limitations of motion  feet .  No crepitus or effusions noted.  No bony pathology or digital deformities noted.  Skin  normotropic skin with no porokeratosis noted bilaterally.  No signs of infections or ulcers noted.     Onychomycosis  Pain in right toes  Pain in left toes  Consent was obtained for treatment procedures.   Mechanical debridement of nails 1-5  bilaterally performed with a nail nipper.  Filed with dremel without incident.    Return office visit   10 weeks                   Told patient to return for periodic foot care and evaluation due to potential at risk complications. Crest pads dispensed.   Helane Gunther DPM

## 2024-07-31 DIAGNOSIS — E782 Mixed hyperlipidemia: Secondary | ICD-10-CM | POA: Diagnosis not present

## 2024-07-31 DIAGNOSIS — R7303 Prediabetes: Secondary | ICD-10-CM | POA: Diagnosis not present

## 2024-07-31 DIAGNOSIS — I130 Hypertensive heart and chronic kidney disease with heart failure and stage 1 through stage 4 chronic kidney disease, or unspecified chronic kidney disease: Secondary | ICD-10-CM | POA: Diagnosis not present

## 2024-07-31 DIAGNOSIS — I5022 Chronic systolic (congestive) heart failure: Secondary | ICD-10-CM | POA: Diagnosis not present

## 2024-07-31 DIAGNOSIS — I2510 Atherosclerotic heart disease of native coronary artery without angina pectoris: Secondary | ICD-10-CM | POA: Diagnosis not present

## 2024-07-31 DIAGNOSIS — I739 Peripheral vascular disease, unspecified: Secondary | ICD-10-CM | POA: Diagnosis not present

## 2024-07-31 DIAGNOSIS — Z79899 Other long term (current) drug therapy: Secondary | ICD-10-CM | POA: Diagnosis not present

## 2024-07-31 DIAGNOSIS — N182 Chronic kidney disease, stage 2 (mild): Secondary | ICD-10-CM | POA: Diagnosis not present

## 2024-08-06 DIAGNOSIS — L820 Inflamed seborrheic keratosis: Secondary | ICD-10-CM | POA: Diagnosis not present

## 2024-08-06 DIAGNOSIS — L570 Actinic keratosis: Secondary | ICD-10-CM | POA: Diagnosis not present

## 2024-08-06 DIAGNOSIS — D225 Melanocytic nevi of trunk: Secondary | ICD-10-CM | POA: Diagnosis not present

## 2024-08-06 DIAGNOSIS — Z85828 Personal history of other malignant neoplasm of skin: Secondary | ICD-10-CM | POA: Diagnosis not present

## 2024-08-06 DIAGNOSIS — Z85820 Personal history of malignant melanoma of skin: Secondary | ICD-10-CM | POA: Diagnosis not present

## 2024-08-07 DIAGNOSIS — M25521 Pain in right elbow: Secondary | ICD-10-CM | POA: Diagnosis not present

## 2024-08-07 DIAGNOSIS — Z23 Encounter for immunization: Secondary | ICD-10-CM | POA: Diagnosis not present

## 2024-08-07 DIAGNOSIS — I739 Peripheral vascular disease, unspecified: Secondary | ICD-10-CM | POA: Diagnosis not present

## 2024-08-07 DIAGNOSIS — I130 Hypertensive heart and chronic kidney disease with heart failure and stage 1 through stage 4 chronic kidney disease, or unspecified chronic kidney disease: Secondary | ICD-10-CM | POA: Diagnosis not present

## 2024-08-07 DIAGNOSIS — N182 Chronic kidney disease, stage 2 (mild): Secondary | ICD-10-CM | POA: Diagnosis not present

## 2024-08-07 DIAGNOSIS — I5022 Chronic systolic (congestive) heart failure: Secondary | ICD-10-CM | POA: Diagnosis not present

## 2024-08-10 ENCOUNTER — Other Ambulatory Visit (HOSPITAL_COMMUNITY): Payer: Self-pay

## 2024-08-10 DIAGNOSIS — I5022 Chronic systolic (congestive) heart failure: Secondary | ICD-10-CM

## 2024-08-10 MED ORDER — SPIRONOLACTONE 25 MG PO TABS: 25.0000 mg | ORAL_TABLET | Freq: Every day | ORAL | 3 refills | Status: AC

## 2024-09-01 DIAGNOSIS — M25521 Pain in right elbow: Secondary | ICD-10-CM | POA: Diagnosis not present

## 2024-09-04 ENCOUNTER — Ambulatory Visit: Payer: Medicare Other

## 2024-09-04 ENCOUNTER — Ambulatory Visit

## 2024-09-04 DIAGNOSIS — I5022 Chronic systolic (congestive) heart failure: Secondary | ICD-10-CM | POA: Diagnosis not present

## 2024-09-06 ENCOUNTER — Ambulatory Visit: Payer: Self-pay | Admitting: Internal Medicine

## 2024-09-06 LAB — CUP PACEART REMOTE DEVICE CHECK
Battery Remaining Longevity: 12 mo
Battery Remaining Percentage: 11 %
Battery Voltage: 2.83 V
Brady Statistic AP VP Percent: 1 %
Brady Statistic AP VS Percent: 1 %
Brady Statistic AS VP Percent: 99 %
Brady Statistic AS VS Percent: 1 %
Brady Statistic RA Percent Paced: 1 %
Date Time Interrogation Session: 20251114020013
Implantable Lead Connection Status: 753985
Implantable Lead Connection Status: 753985
Implantable Lead Connection Status: 753985
Implantable Lead Implant Date: 20170406
Implantable Lead Implant Date: 20170406
Implantable Lead Implant Date: 20170406
Implantable Lead Location: 753858
Implantable Lead Location: 753859
Implantable Lead Location: 753860
Implantable Pulse Generator Implant Date: 20170406
Lead Channel Impedance Value: 450 Ohm
Lead Channel Impedance Value: 480 Ohm
Lead Channel Impedance Value: 790 Ohm
Lead Channel Pacing Threshold Amplitude: 0.5 V
Lead Channel Pacing Threshold Amplitude: 1.125 V
Lead Channel Pacing Threshold Amplitude: 1.25 V
Lead Channel Pacing Threshold Pulse Width: 0.4 ms
Lead Channel Pacing Threshold Pulse Width: 0.4 ms
Lead Channel Pacing Threshold Pulse Width: 0.4 ms
Lead Channel Sensing Intrinsic Amplitude: 12 mV
Lead Channel Sensing Intrinsic Amplitude: 2.1 mV
Lead Channel Setting Pacing Amplitude: 2 V
Lead Channel Setting Pacing Amplitude: 2.125
Lead Channel Setting Pacing Amplitude: 2.25 V
Lead Channel Setting Pacing Pulse Width: 0.4 ms
Lead Channel Setting Pacing Pulse Width: 0.4 ms
Lead Channel Setting Sensing Sensitivity: 2 mV
Pulse Gen Model: 3262
Pulse Gen Serial Number: 7866838

## 2024-09-08 ENCOUNTER — Other Ambulatory Visit (HOSPITAL_COMMUNITY): Payer: Self-pay

## 2024-09-08 MED ORDER — FUROSEMIDE 20 MG PO TABS: 20.0000 mg | ORAL_TABLET | ORAL | 6 refills | Status: AC | PRN

## 2024-09-08 NOTE — Progress Notes (Signed)
 Remote PPM Transmission

## 2024-10-02 ENCOUNTER — Ambulatory Visit: Admitting: Podiatry

## 2024-10-02 ENCOUNTER — Encounter: Payer: Self-pay | Admitting: Podiatry

## 2024-10-02 DIAGNOSIS — D689 Coagulation defect, unspecified: Secondary | ICD-10-CM | POA: Diagnosis not present

## 2024-10-02 DIAGNOSIS — B351 Tinea unguium: Secondary | ICD-10-CM | POA: Diagnosis not present

## 2024-10-02 DIAGNOSIS — M79676 Pain in unspecified toe(s): Secondary | ICD-10-CM | POA: Diagnosis not present

## 2024-10-02 DIAGNOSIS — I739 Peripheral vascular disease, unspecified: Secondary | ICD-10-CM | POA: Diagnosis not present

## 2024-10-02 NOTE — Progress Notes (Signed)
 This patient returns to my office for at risk foot care.  This patient requires this care by a professional since this patient will be at risk due to having renal insufficiency coagulation defect and PAD.   Patient is taking plavix.    This patient is unable to cut nails himself since the patient cannot reach his nails.These nails are painful walking and wearing shoes.  This patient presents for at risk foot care today.  General Appearance  Alert, conversant and in no acute stress.  Vascular  Dorsalis pedis and posterior tibial  pulses are weakly  palpable  bilaterally.  Capillary return is within normal limits  bilaterally. Temperature is within normal limits  bilaterally.  Neurologic  Senn-Weinstein monofilament wire test within normal limits  bilaterally. Muscle power within normal limits bilaterally.  Nails Thick disfigured discolored nails with subungual debris  from hallux to fifth toes bilaterally. No evidence of bacterial infection or drainage bilaterally.  Orthopedic  No limitations of motion  feet .  No crepitus or effusions noted.  No bony pathology or digital deformities noted.  Skin  normotropic skin with no porokeratosis noted bilaterally.  No signs of infections or ulcers noted.     Onychomycosis  Pain in right toes  Pain in left toes  Consent was obtained for treatment procedures.   Mechanical debridement of nails 1-5  bilaterally performed with a nail nipper.  Filed with dremel without incident.    Return office visit   10 weeks                   Told patient to return for periodic foot care and evaluation due to potential at risk complications. Crest pads dispensed.   Helane Gunther DPM

## 2024-10-07 DIAGNOSIS — M459 Ankylosing spondylitis of unspecified sites in spine: Secondary | ICD-10-CM | POA: Diagnosis not present

## 2024-10-07 DIAGNOSIS — Z79899 Other long term (current) drug therapy: Secondary | ICD-10-CM | POA: Diagnosis not present

## 2024-10-12 ENCOUNTER — Encounter: Payer: Self-pay | Admitting: Podiatry

## 2024-10-12 ENCOUNTER — Ambulatory Visit: Admitting: Podiatry

## 2024-10-12 VITALS — Ht 66.0 in | Wt 149.6 lb

## 2024-10-12 DIAGNOSIS — L97522 Non-pressure chronic ulcer of other part of left foot with fat layer exposed: Secondary | ICD-10-CM

## 2024-10-12 DIAGNOSIS — L03032 Cellulitis of left toe: Secondary | ICD-10-CM

## 2024-10-12 MED ORDER — MUPIROCIN 2 % EX OINT: 1.0000 | TOPICAL_OINTMENT | Freq: Two times a day (BID) | CUTANEOUS | 1 refills | Status: AC

## 2024-10-12 MED ORDER — DOXYCYCLINE HYCLATE 100 MG PO TABS: 100.0000 mg | ORAL_TABLET | Freq: Two times a day (BID) | ORAL | 0 refills | Status: AC

## 2024-10-12 NOTE — Progress Notes (Signed)
 "  No chief complaint on file.   Subjective:  89 y.o. male presenting for new onset of redness and swelling to the left great toe.  Recently seen here in our office for routine footcare.  He noticed increased swelling to the toe over this past weekend. Onset ~10/10/2024.   Past Medical History:  Diagnosis Date   AAA (abdominal aortic aneurysm)    Abnormal stress test    Allergic rhinitis    uses Flonase  daily   Anginal pain    pain in neck,shoulders,down arms occ   Arthritis    Cancer (HCC)    skin   Carotid artery occlusion    Chronic venous insufficiency    Coronary artery disease    Dizziness    takes Antivert  daily as needed   ED (erectile dysfunction)    GERD (gastroesophageal reflux disease)    takes Omeprazole daily   Hiatal hernia    Hx of echocardiogram 04/04/2010   showed a borderline dilatd left ventricle with mild left ventricle hypertrophy and an EF 50-55% doppler suggested diastolic dysfunction, although the pattern is probably normal for an 89 yrs old. The left atrium was indeed mildly dilated at 42 mm, but there are no significant valvular abnormalities.    Hyperlipidemia    Hypertension    takes Proscar ,Dyazide ,Avapro ,and Metoprolol  daily   Myocardial infarction [facility]) Oct. 11, 2016   Heart Attack   Palpitation    Shortness of breath    Sigmoid diverticulosis    T12 compression fracture (HCC)    Thyroid  disease     Past Surgical History:  Procedure Laterality Date   ABDOMINAL AORTIC ANEURYSM REPAIR  02/24/2009   AAA Stenting   CARDIAC CATHETERIZATION N/A 08/04/2015   Procedure: Right/Left Heart Cath and Coronary Angiography;  Surgeon: Ezra GORMAN Shuck, MD;  Location: [facility] INVASIVE CV LAB;  Service: Cardiovascular;  Laterality: N/A;   CARDIAC CATHETERIZATION N/A 08/04/2015   Procedure: Coronary Stent Intervention;  Surgeon: Lonni JONETTA Cash, MD;  Location: [facility] INVASIVE CV LAB;  Service: Cardiovascular;  Laterality: N/A;   CARDIAC CATHETERIZATION N/A  08/08/2015   Procedure: Coronary Stent Intervention;  Surgeon: Peter M Jordan, MD;  Location: [facility] INVASIVE CV LAB;  Service: Cardiovascular;  Laterality: N/A;   ENDARTERECTOMY Right 10/27/2013   Procedure: ENDARTERECTOMY CAROTID;  Surgeon: Carlin FORBES Haddock, MD;  Location: [facility] OR;  Service: Vascular;  Laterality: Right;   EP IMPLANTABLE DEVICE N/A 01/26/2016   Procedure: BiV Pacemaker Insertion CRT-P;  Surgeon: Danelle LELON Birmingham, MD;  Location: [facility] INVASIVE CV LAB;  Service: Cardiovascular;  Laterality: N/A;   HAND SURGERY Right    tendon, lft 90's   HERNIA REPAIR Bilateral    LEFT HEART CATHETERIZATION WITH CORONARY ANGIOGRAM N/A 10/12/2013   Procedure: LEFT HEART CATHETERIZATION WITH CORONARY ANGIOGRAM;  Surgeon: Dorn JINNY Lesches, MD;  Location: [facility] CATH LAB;  Service: Cardiovascular;  Laterality: N/A;   melanoma removed     ROTATOR CUFF REPAIR  2003   left arm    Allergies[1]    LT great toe 10/12/2024 postdebridement  LT great toe 10/12/2024 predebridement  LT foot 10/12/2024  Objective/Physical Exam General: The patient is alert and oriented x3 in no acute distress.  Dermatology:  After excision and removal of the overlying callus there is a wound noted to the toe.  Wound #1 noted to the left great toe which measures approximately 1.5 x 1.0 x 0.1 cm (LxWxD).   To the noted ulceration(s), there is no eschar. There is a moderate  amount of slough, fibrin, and necrotic tissue noted. Granulation tissue and wound base is red. There is a minimal amount of serosanguineous drainage noted. There is no exposed bone muscle-tendon ligament or joint. There is no malodor. Periwound integrity is intact. Skin is warm, dry and supple bilateral lower extremities.  Vascular: Palpable pedal pulses bilaterally.  Moderate erythema and edema localized around the forefoot and great toe. Capi light touch and protective threshold diminished bilaterally.   Musculoskeletal Exam: Range of motion within normal limits  to all pedal and ankle joints bilateral. Muscle strength 5/5 in all groups bilateral.  Patient ambulatory.  No prior amputations  Assessment: 1.  Ulcer left great toe with fat layer exposed 2.  Cellulitis left foot   Plan of Care:  -Patient was evaluated. -Medically necessary excisional debridement including subcutaneous tissue was performed using a tissue nipper and a chisel blade. Excisional debridement of all the necrotic nonviable tissue down to healthy bleeding viable tissue was performed with post-debridement measurements same as pre-. -Triple anabiotic and a Band-Aid applied. -Prescription for doxycycline  100 mg twice daily x 10 days -Prescription for mupirocin  ointment apply twice daily with under occlusion with a Band-Aid -Return to clinic 2 weeks   Thresa EMERSON Sar, DPM [facility]  Dr. Thresa EMERSON Sar, DPM    [address], [place] [address]                Office [phone]  Fax [phone]        [1]  Allergies Allergen Reactions   Doxepin Other (See Comments)    hallucinations  hallucinations Hallucinations  hallucinations Hallucinations hallucinations Hallucinations   Sinequan [Doxepin Hcl] Other (See Comments)    hallucinations   Meloxicam Other (See Comments)   Verapamil  Other (See Comments)    Caused heart to race Caused heart to race  Caused heart to race Caused heart to race   Procaine Itching and Other (See Comments)    Tingling all over as soon as it was injected  Tingling all over as soon as it was injected Tingling all over as soon as it was injected   "

## 2024-10-28 ENCOUNTER — Ambulatory Visit: Admitting: Podiatry

## 2024-11-04 ENCOUNTER — Encounter: Payer: Self-pay | Admitting: Podiatry

## 2024-11-04 ENCOUNTER — Ambulatory Visit: Admitting: Podiatry

## 2024-11-04 VITALS — Ht 66.0 in | Wt 149.6 lb

## 2024-11-04 DIAGNOSIS — L03032 Cellulitis of left toe: Secondary | ICD-10-CM | POA: Diagnosis not present

## 2024-11-04 NOTE — Progress Notes (Signed)
 "  Chief Complaint  Patient presents with   Foot Ulcer    Pt is here to f/u on left great toe due to ulcer, he states everything is going well, complains of right second toe soreness and wants to have it checked out as well.    Subjective:  89 y.o. male presenting for follow-up evaluation of redness and swelling to the left great toe.  He completed the oral antibiotics as prescribed.  He states that his foot is doing much better no new complaints   Past Medical History:  Diagnosis Date   AAA (abdominal aortic aneurysm)    Abnormal stress test    Allergic rhinitis    uses Flonase  daily   Anginal pain    pain in neck,shoulders,down arms occ   Arthritis    Cancer (HCC)    skin   Carotid artery occlusion    Chronic venous insufficiency    Coronary artery disease    Dizziness    takes Antivert  daily as needed   ED (erectile dysfunction)    GERD (gastroesophageal reflux disease)    takes Omeprazole daily   Hiatal hernia    Hx of echocardiogram 04/04/2010   showed a borderline dilatd left ventricle with mild left ventricle hypertrophy and an EF 50-55% doppler suggested diastolic dysfunction, although the pattern is probably normal for an 89 yrs old. The left atrium was indeed mildly dilated at 42 mm, but there are no significant valvular abnormalities.    Hyperlipidemia    Hypertension    takes Proscar ,Dyazide ,Avapro ,and Metoprolol  daily   Myocardial infarction [facility]) Oct. 11, 2016   Heart Attack   Palpitation    Shortness of breath    Sigmoid diverticulosis    T12 compression fracture (HCC)    Thyroid  disease     Past Surgical History:  Procedure Laterality Date   ABDOMINAL AORTIC ANEURYSM REPAIR  02/24/2009   AAA Stenting   CARDIAC CATHETERIZATION N/A 08/04/2015   Procedure: Right/Left Heart Cath and Coronary Angiography;  Surgeon: Ezra GORMAN Shuck, MD;  Location: [facility] INVASIVE CV LAB;  Service: Cardiovascular;  Laterality: N/A;   CARDIAC CATHETERIZATION N/A 08/04/2015    Procedure: Coronary Stent Intervention;  Surgeon: Lonni JONETTA Cash, MD;  Location: [facility] INVASIVE CV LAB;  Service: Cardiovascular;  Laterality: N/A;   CARDIAC CATHETERIZATION N/A 08/08/2015   Procedure: Coronary Stent Intervention;  Surgeon: Peter M Jordan, MD;  Location: [facility] INVASIVE CV LAB;  Service: Cardiovascular;  Laterality: N/A;   ENDARTERECTOMY Right 10/27/2013   Procedure: ENDARTERECTOMY CAROTID;  Surgeon: Carlin FORBES Haddock, MD;  Location: [facility] OR;  Service: Vascular;  Laterality: Right;   EP IMPLANTABLE DEVICE N/A 01/26/2016   Procedure: BiV Pacemaker Insertion CRT-P;  Surgeon: Danelle LELON Birmingham, MD;  Location: [facility] INVASIVE CV LAB;  Service: Cardiovascular;  Laterality: N/A;   HAND SURGERY Right    tendon, lft 90's   HERNIA REPAIR Bilateral    LEFT HEART CATHETERIZATION WITH CORONARY ANGIOGRAM N/A 10/12/2013   Procedure: LEFT HEART CATHETERIZATION WITH CORONARY ANGIOGRAM;  Surgeon: Dorn JINNY Lesches, MD;  Location: [facility] CATH LAB;  Service: Cardiovascular;  Laterality: N/A;   melanoma removed     ROTATOR CUFF REPAIR  2003   left arm    Allergies[1]    LT great toe 10/12/2024 postdebridement  LT great toe 10/12/2024 predebridement  LT foot 10/12/2024  Objective/Physical Exam General: The patient is alert and oriented x3 in no acute distress.  Dermatology:  The ulcer noted to the distal aspect of the left  great toe is resolved and healed completely.  No erythema or edema noted.  No open wound noted  Vascular: Palpable pedal pulses bilaterally.  Capillary refill WNL.  There is no erythema associated to the left great toe.  It appears to be completely resolved  Musculoskeletal Exam: Range of motion within normal limits to all pedal and ankle joints bilateral. Muscle strength 5/5 in all groups bilateral.  Patient ambulatory.  No prior amputations  Assessment: 1.  Ulcer left great toe with fat layer exposed 2.  Cellulitis left foot   Plan of Care:  -Patient was evaluated. - The  ulcer to the left great toe as well as the cellulitis is completely resolved. -Resume good supportive tennis shoes and sneakers -Upcoming appointment on 12/04/2024 with Dr. Maritza for routine footcare -Return to clinic with me PRN   Thresa EMERSON Sar, DPM [facility]  Dr. Thresa EMERSON Sar, DPM    [address], [place] [address]                Office [phone]  Fax [phone]        [1]  Allergies Allergen Reactions   Doxepin Other (See Comments)    hallucinations  hallucinations Hallucinations  hallucinations Hallucinations hallucinations Hallucinations   Sinequan [Doxepin Hcl] Other (See Comments)    hallucinations   Meloxicam Other (See Comments)   Verapamil  Other (See Comments)    Caused heart to race Caused heart to race  Caused heart to race Caused heart to race   Procaine Itching and Other (See Comments)    Tingling all over as soon as it was injected  Tingling all over as soon as it was injected Tingling all over as soon as it was injected   "

## 2024-11-11 ENCOUNTER — Other Ambulatory Visit (HOSPITAL_COMMUNITY): Payer: Self-pay

## 2024-11-11 DIAGNOSIS — I5022 Chronic systolic (congestive) heart failure: Secondary | ICD-10-CM

## 2024-11-11 MED ORDER — CARVEDILOL 12.5 MG PO TABS: 12.5000 mg | ORAL_TABLET | Freq: Two times a day (BID) | ORAL | 3 refills | Status: AC

## 2024-12-04 ENCOUNTER — Ambulatory Visit: Admitting: Podiatry

## 2024-12-04 ENCOUNTER — Ambulatory Visit

## 2024-12-25 ENCOUNTER — Encounter (HOSPITAL_COMMUNITY)

## 2025-03-05 ENCOUNTER — Ambulatory Visit

## 2025-04-13 ENCOUNTER — Ambulatory Visit: Admitting: Physician Assistant

## 2025-06-04 ENCOUNTER — Ambulatory Visit

## 2025-09-03 ENCOUNTER — Ambulatory Visit
